# Patient Record
Sex: Female | Born: 1940
Health system: Southern US, Community
[De-identification: ages and names within clinical notes are randomized; demographics above are authoritative.]

## PROBLEM LIST (undated history)

## (undated) DIAGNOSIS — K259 Gastric ulcer, unspecified as acute or chronic, without hemorrhage or perforation: Secondary | ICD-10-CM

## (undated) DIAGNOSIS — K469 Unspecified abdominal hernia without obstruction or gangrene: Secondary | ICD-10-CM

## (undated) DIAGNOSIS — E785 Hyperlipidemia, unspecified: Secondary | ICD-10-CM

## (undated) DIAGNOSIS — M1990 Unspecified osteoarthritis, unspecified site: Secondary | ICD-10-CM

## (undated) DIAGNOSIS — K219 Gastro-esophageal reflux disease without esophagitis: Secondary | ICD-10-CM

## (undated) DIAGNOSIS — Z950 Presence of cardiac pacemaker: Secondary | ICD-10-CM

## (undated) DIAGNOSIS — IMO0001 Reserved for inherently not codable concepts without codable children: Secondary | ICD-10-CM

## (undated) DIAGNOSIS — I441 Atrioventricular block, second degree: Secondary | ICD-10-CM

## (undated) DIAGNOSIS — J449 Chronic obstructive pulmonary disease, unspecified: Secondary | ICD-10-CM

## (undated) DIAGNOSIS — I10 Essential (primary) hypertension: Secondary | ICD-10-CM

## (undated) DIAGNOSIS — J439 Emphysema, unspecified: Secondary | ICD-10-CM

## (undated) DIAGNOSIS — G4733 Obstructive sleep apnea (adult) (pediatric): Secondary | ICD-10-CM

## (undated) DIAGNOSIS — K2270 Barrett's esophagus without dysplasia: Secondary | ICD-10-CM

## (undated) DIAGNOSIS — G629 Polyneuropathy, unspecified: Secondary | ICD-10-CM

## (undated) HISTORY — DX: Hyperlipidemia, unspecified: E78.5

## (undated) HISTORY — PX: COLON SURGERY: SHX602

## (undated) HISTORY — PX: HERNIA REPAIR: SHX51

## (undated) HISTORY — DX: Essential (primary) hypertension: I10

## (undated) HISTORY — DX: Unspecified abdominal hernia without obstruction or gangrene: K46.9

## (undated) HISTORY — DX: Obstructive sleep apnea (adult) (pediatric): G47.33

## (undated) HISTORY — PX: INSERT / REPLACE / REMOVE PACEMAKER: SUR710

## (undated) HISTORY — DX: Chronic obstructive pulmonary disease, unspecified: J44.9

---

## 1978-11-28 HISTORY — PX: TOTAL ABDOMINAL HYSTERECTOMY: SHX209

## 2004-08-17 ENCOUNTER — Other Ambulatory Visit: Payer: Self-pay

## 2004-08-19 ENCOUNTER — Other Ambulatory Visit: Payer: Self-pay

## 2004-11-28 HISTORY — PX: APPENDECTOMY: SHX54

## 2005-04-21 ENCOUNTER — Ambulatory Visit: Payer: Self-pay | Admitting: Family Medicine

## 2005-08-10 ENCOUNTER — Ambulatory Visit: Payer: Self-pay | Admitting: Family Medicine

## 2005-08-17 ENCOUNTER — Ambulatory Visit: Payer: Self-pay | Admitting: Family Medicine

## 2005-08-22 DIAGNOSIS — R1031 Right lower quadrant pain: Secondary | ICD-10-CM | POA: Insufficient documentation

## 2005-08-22 DIAGNOSIS — K3533 Acute appendicitis with perforation and localized peritonitis, with abscess: Secondary | ICD-10-CM | POA: Insufficient documentation

## 2005-09-20 ENCOUNTER — Ambulatory Visit: Payer: Self-pay | Admitting: Surgery

## 2005-09-29 ENCOUNTER — Other Ambulatory Visit: Payer: Self-pay

## 2005-10-03 ENCOUNTER — Ambulatory Visit: Payer: Self-pay | Admitting: Surgery

## 2005-11-28 HISTORY — PX: CARDIAC CATHETERIZATION: SHX172

## 2005-11-28 HISTORY — PX: NISSEN FUNDOPLICATION: SHX2091

## 2005-11-28 HISTORY — PX: HIATAL HERNIA REPAIR: SHX195

## 2005-12-20 ENCOUNTER — Ambulatory Visit: Payer: Self-pay | Admitting: Internal Medicine

## 2005-12-28 ENCOUNTER — Ambulatory Visit: Payer: Self-pay | Admitting: Internal Medicine

## 2006-01-03 ENCOUNTER — Ambulatory Visit: Payer: Self-pay | Admitting: General Surgery

## 2006-02-09 ENCOUNTER — Ambulatory Visit: Payer: Self-pay

## 2006-02-12 ENCOUNTER — Ambulatory Visit: Payer: Self-pay | Admitting: Internal Medicine

## 2006-03-06 ENCOUNTER — Other Ambulatory Visit: Payer: Self-pay

## 2006-03-06 ENCOUNTER — Inpatient Hospital Stay: Payer: Self-pay | Admitting: Internal Medicine

## 2006-07-18 ENCOUNTER — Ambulatory Visit: Payer: Self-pay | Admitting: Gastroenterology

## 2006-08-28 DIAGNOSIS — K2100 Gastro-esophageal reflux disease with esophagitis, without bleeding: Secondary | ICD-10-CM | POA: Insufficient documentation

## 2006-10-04 ENCOUNTER — Ambulatory Visit: Payer: Self-pay | Admitting: Surgery

## 2006-10-16 ENCOUNTER — Ambulatory Visit: Payer: Self-pay | Admitting: Family Medicine

## 2007-02-01 ENCOUNTER — Ambulatory Visit: Payer: Self-pay | Admitting: Family Medicine

## 2007-03-12 ENCOUNTER — Ambulatory Visit: Payer: Self-pay | Admitting: Gastroenterology

## 2007-04-22 ENCOUNTER — Ambulatory Visit: Payer: Self-pay | Admitting: Emergency Medicine

## 2007-10-30 ENCOUNTER — Ambulatory Visit: Payer: Self-pay | Admitting: Gastroenterology

## 2007-10-31 ENCOUNTER — Ambulatory Visit: Payer: Self-pay | Admitting: Internal Medicine

## 2007-12-15 ENCOUNTER — Inpatient Hospital Stay: Payer: Self-pay | Admitting: Internal Medicine

## 2007-12-15 ENCOUNTER — Ambulatory Visit: Payer: Self-pay | Admitting: Internal Medicine

## 2007-12-19 ENCOUNTER — Ambulatory Visit: Payer: Self-pay | Admitting: Family Medicine

## 2008-11-18 ENCOUNTER — Ambulatory Visit: Payer: Self-pay | Admitting: Family Medicine

## 2008-11-26 ENCOUNTER — Ambulatory Visit: Payer: Self-pay | Admitting: Family Medicine

## 2008-12-09 ENCOUNTER — Ambulatory Visit: Payer: Self-pay | Admitting: Urology

## 2009-02-10 ENCOUNTER — Ambulatory Visit: Payer: Self-pay | Admitting: Family Medicine

## 2009-02-10 LAB — HM MAMMOGRAPHY

## 2009-04-15 ENCOUNTER — Ambulatory Visit: Payer: Self-pay | Admitting: Internal Medicine

## 2009-05-27 ENCOUNTER — Ambulatory Visit: Payer: Self-pay | Admitting: Family Medicine

## 2009-06-22 ENCOUNTER — Ambulatory Visit: Payer: Self-pay | Admitting: Gastroenterology

## 2009-11-13 ENCOUNTER — Ambulatory Visit: Payer: Self-pay | Admitting: Urology

## 2009-11-16 ENCOUNTER — Ambulatory Visit: Payer: Self-pay | Admitting: Urology

## 2010-04-12 ENCOUNTER — Ambulatory Visit: Payer: Self-pay | Admitting: Urology

## 2010-10-19 ENCOUNTER — Ambulatory Visit: Payer: Self-pay | Admitting: Family Medicine

## 2010-12-09 ENCOUNTER — Ambulatory Visit: Payer: Self-pay | Admitting: Urology

## 2011-01-14 ENCOUNTER — Ambulatory Visit: Payer: Self-pay | Admitting: Family Medicine

## 2011-03-10 ENCOUNTER — Ambulatory Visit: Payer: Self-pay | Admitting: Internal Medicine

## 2011-03-16 ENCOUNTER — Ambulatory Visit: Payer: Self-pay | Admitting: Ophthalmology

## 2011-03-29 ENCOUNTER — Ambulatory Visit: Payer: Self-pay | Admitting: Family Medicine

## 2011-04-13 ENCOUNTER — Ambulatory Visit: Payer: Self-pay | Admitting: Ophthalmology

## 2011-09-13 ENCOUNTER — Other Ambulatory Visit: Payer: Self-pay | Admitting: Gastroenterology

## 2011-09-20 ENCOUNTER — Ambulatory Visit: Payer: Self-pay | Admitting: Internal Medicine

## 2011-09-22 DIAGNOSIS — Z950 Presence of cardiac pacemaker: Secondary | ICD-10-CM

## 2011-09-22 HISTORY — PX: PACEMAKER INSERTION: SHX728

## 2011-09-22 HISTORY — DX: Presence of cardiac pacemaker: Z95.0

## 2011-12-05 ENCOUNTER — Ambulatory Visit: Payer: Self-pay | Admitting: Family Medicine

## 2011-12-05 DIAGNOSIS — E7800 Pure hypercholesterolemia, unspecified: Secondary | ICD-10-CM | POA: Diagnosis not present

## 2011-12-05 DIAGNOSIS — Z79899 Other long term (current) drug therapy: Secondary | ICD-10-CM | POA: Diagnosis not present

## 2011-12-05 DIAGNOSIS — R5381 Other malaise: Secondary | ICD-10-CM | POA: Diagnosis not present

## 2011-12-05 LAB — CBC WITH DIFFERENTIAL/PLATELET
Basophil %: 0.7 %
Eosinophil %: 2.7 %
HCT: 48.4 % — ABNORMAL HIGH (ref 35.0–47.0)
HGB: 15.8 g/dL (ref 12.0–16.0)
MCH: 29.1 pg (ref 26.0–34.0)
MCV: 89 fL (ref 80–100)
Monocyte #: 0.4 10*3/uL (ref 0.0–0.7)
Neutrophil %: 77.8 %
RBC: 5.44 10*6/uL — ABNORMAL HIGH (ref 3.80–5.20)

## 2011-12-05 LAB — COMPREHENSIVE METABOLIC PANEL
Alkaline Phosphatase: 116 U/L (ref 50–136)
Anion Gap: 8 (ref 7–16)
BUN: 21 mg/dL — ABNORMAL HIGH (ref 7–18)
Co2: 31 mmol/L (ref 21–32)
Creatinine: 1.12 mg/dL (ref 0.60–1.30)
EGFR (African American): >60
EGFR (Non-African Amer.): 51 — ABNORMAL LOW
Osmolality: 285 (ref 275–301)
SGOT(AST): 18 U/L (ref 15–37)
SGPT (ALT): 20 U/L

## 2011-12-05 LAB — LIPID PANEL
Cholesterol: 313 mg/dL — ABNORMAL HIGH (ref 0–200)
Ldl Cholesterol, Calc: 220 mg/dL — ABNORMAL HIGH (ref 0–100)
VLDL Cholesterol, Calc: 49 mg/dL — ABNORMAL HIGH (ref 5–40)

## 2011-12-07 DIAGNOSIS — R5381 Other malaise: Secondary | ICD-10-CM | POA: Diagnosis not present

## 2011-12-07 DIAGNOSIS — E782 Mixed hyperlipidemia: Secondary | ICD-10-CM | POA: Diagnosis not present

## 2011-12-07 DIAGNOSIS — I441 Atrioventricular block, second degree: Secondary | ICD-10-CM | POA: Diagnosis not present

## 2011-12-07 DIAGNOSIS — R5383 Other fatigue: Secondary | ICD-10-CM | POA: Diagnosis not present

## 2011-12-07 DIAGNOSIS — I10 Essential (primary) hypertension: Secondary | ICD-10-CM | POA: Diagnosis not present

## 2011-12-20 DIAGNOSIS — R198 Other specified symptoms and signs involving the digestive system and abdomen: Secondary | ICD-10-CM | POA: Diagnosis not present

## 2011-12-20 DIAGNOSIS — K2270 Barrett's esophagus without dysplasia: Secondary | ICD-10-CM | POA: Diagnosis not present

## 2011-12-20 DIAGNOSIS — R1031 Right lower quadrant pain: Secondary | ICD-10-CM | POA: Diagnosis not present

## 2011-12-29 DIAGNOSIS — I10 Essential (primary) hypertension: Secondary | ICD-10-CM | POA: Diagnosis not present

## 2011-12-29 DIAGNOSIS — R5381 Other malaise: Secondary | ICD-10-CM | POA: Diagnosis not present

## 2011-12-29 DIAGNOSIS — G4730 Sleep apnea, unspecified: Secondary | ICD-10-CM | POA: Diagnosis not present

## 2011-12-29 DIAGNOSIS — I441 Atrioventricular block, second degree: Secondary | ICD-10-CM | POA: Diagnosis not present

## 2012-01-04 DIAGNOSIS — F17200 Nicotine dependence, unspecified, uncomplicated: Secondary | ICD-10-CM | POA: Diagnosis not present

## 2012-01-04 DIAGNOSIS — I10 Essential (primary) hypertension: Secondary | ICD-10-CM | POA: Diagnosis not present

## 2012-01-04 DIAGNOSIS — R5381 Other malaise: Secondary | ICD-10-CM | POA: Diagnosis not present

## 2012-01-04 DIAGNOSIS — J209 Acute bronchitis, unspecified: Secondary | ICD-10-CM | POA: Diagnosis not present

## 2012-01-04 DIAGNOSIS — R5383 Other fatigue: Secondary | ICD-10-CM | POA: Diagnosis not present

## 2012-01-06 DIAGNOSIS — F17200 Nicotine dependence, unspecified, uncomplicated: Secondary | ICD-10-CM | POA: Diagnosis not present

## 2012-01-06 DIAGNOSIS — I10 Essential (primary) hypertension: Secondary | ICD-10-CM | POA: Diagnosis not present

## 2012-01-06 DIAGNOSIS — R5383 Other fatigue: Secondary | ICD-10-CM | POA: Diagnosis not present

## 2012-01-06 DIAGNOSIS — R5381 Other malaise: Secondary | ICD-10-CM | POA: Diagnosis not present

## 2012-01-06 DIAGNOSIS — J40 Bronchitis, not specified as acute or chronic: Secondary | ICD-10-CM | POA: Diagnosis not present

## 2012-01-06 DIAGNOSIS — R0602 Shortness of breath: Secondary | ICD-10-CM | POA: Diagnosis not present

## 2012-01-06 DIAGNOSIS — R062 Wheezing: Secondary | ICD-10-CM | POA: Diagnosis not present

## 2012-01-06 DIAGNOSIS — R918 Other nonspecific abnormal finding of lung field: Secondary | ICD-10-CM | POA: Diagnosis not present

## 2012-01-09 DIAGNOSIS — R059 Cough, unspecified: Secondary | ICD-10-CM | POA: Diagnosis not present

## 2012-01-09 DIAGNOSIS — J209 Acute bronchitis, unspecified: Secondary | ICD-10-CM | POA: Diagnosis not present

## 2012-01-09 DIAGNOSIS — R5381 Other malaise: Secondary | ICD-10-CM | POA: Diagnosis not present

## 2012-01-09 DIAGNOSIS — J449 Chronic obstructive pulmonary disease, unspecified: Secondary | ICD-10-CM | POA: Diagnosis not present

## 2012-01-16 ENCOUNTER — Emergency Department: Payer: Self-pay | Admitting: Emergency Medicine

## 2012-01-16 DIAGNOSIS — Z79899 Other long term (current) drug therapy: Secondary | ICD-10-CM | POA: Diagnosis not present

## 2012-01-16 DIAGNOSIS — I10 Essential (primary) hypertension: Secondary | ICD-10-CM | POA: Diagnosis not present

## 2012-01-16 DIAGNOSIS — S42293A Other displaced fracture of upper end of unspecified humerus, initial encounter for closed fracture: Secondary | ICD-10-CM | POA: Diagnosis not present

## 2012-01-16 DIAGNOSIS — Z0489 Encounter for examination and observation for other specified reasons: Secondary | ICD-10-CM | POA: Diagnosis not present

## 2012-01-16 DIAGNOSIS — S0990XA Unspecified injury of head, initial encounter: Secondary | ICD-10-CM | POA: Diagnosis not present

## 2012-01-17 DIAGNOSIS — Z0489 Encounter for examination and observation for other specified reasons: Secondary | ICD-10-CM | POA: Diagnosis not present

## 2012-01-18 DIAGNOSIS — S42293A Other displaced fracture of upper end of unspecified humerus, initial encounter for closed fracture: Secondary | ICD-10-CM | POA: Diagnosis not present

## 2012-01-25 DIAGNOSIS — S42309D Unspecified fracture of shaft of humerus, unspecified arm, subsequent encounter for fracture with routine healing: Secondary | ICD-10-CM | POA: Diagnosis not present

## 2012-01-30 ENCOUNTER — Institutional Professional Consult (permissible substitution): Payer: Self-pay | Admitting: Pulmonary Disease

## 2012-02-06 DIAGNOSIS — S42253A Displaced fracture of greater tuberosity of unspecified humerus, initial encounter for closed fracture: Secondary | ICD-10-CM | POA: Diagnosis not present

## 2012-02-06 DIAGNOSIS — S42293A Other displaced fracture of upper end of unspecified humerus, initial encounter for closed fracture: Secondary | ICD-10-CM | POA: Diagnosis not present

## 2012-02-13 ENCOUNTER — Ambulatory Visit (INDEPENDENT_AMBULATORY_CARE_PROVIDER_SITE_OTHER): Payer: Medicare Other | Admitting: Pulmonary Disease

## 2012-02-13 ENCOUNTER — Encounter: Payer: Self-pay | Admitting: Pulmonary Disease

## 2012-02-13 VITALS — BP 122/76 | HR 81 | Temp 98.2°F | Ht 64.56 in | Wt 170.0 lb

## 2012-02-13 DIAGNOSIS — Z720 Tobacco use: Secondary | ICD-10-CM | POA: Insufficient documentation

## 2012-02-13 DIAGNOSIS — F17200 Nicotine dependence, unspecified, uncomplicated: Secondary | ICD-10-CM | POA: Insufficient documentation

## 2012-02-13 DIAGNOSIS — J3489 Other specified disorders of nose and nasal sinuses: Secondary | ICD-10-CM

## 2012-02-13 DIAGNOSIS — R0981 Nasal congestion: Secondary | ICD-10-CM | POA: Insufficient documentation

## 2012-02-13 DIAGNOSIS — J449 Chronic obstructive pulmonary disease, unspecified: Secondary | ICD-10-CM | POA: Insufficient documentation

## 2012-02-13 MED ORDER — NICOTINE 21 MG/24HR TD PT24: 1.0000 | MEDICATED_PATCH | TRANSDERMAL | Status: AC

## 2012-02-13 NOTE — Patient Instructions (Addendum)
Stop smoking Continue using the symbicort and albuterol as you are doing. Go see Dr. Sullivan Lone about your hip, shoulder and back pain. We will give you nicotine replacement patches Use Neil Med rinses with distilled water at least twice per day using the instructions on the package. 1/2 hour after using the [facility] Med rinse, use Nasonex two puffs in each nostril once per day. Use chlortrimeton and an over the counter decongestant (ask the pharmacist for a recommendation) as needed for the sinus congestion. We will see you back in 2 months and refer you to pulmonary rehab at that point or sooner if your hip pain has improved.

## 2012-02-13 NOTE — Progress Notes (Addendum)
Subjective:    Patient ID: Crystal White, female    DOB: Jul 25, 1941, 71 y.o.   MRN: [id number]  HPI  This is a 71 y/o female with COPD who is referred to our clinic for further evaluation and management tof the same.  She had a normal childhood with out significant respiratory problems, however as an adult she started smoking.  She has smoked one pack per day or more for many decades and continues to smoke one pack per day now.  She was diagnosed with COPD two years ago when she developed dyspnea on exertion.  She has noted a chronic non-productive cough during that time.  She often has to stop and rest while walking on level ground, however she can walk further than 100 yards without stopping (mMRC score 3).  She does not have chest pain with walking but sometimes does note back pain on exertion.  She started taking symbicort two years ago but is unsure if it has helped.  Taking albuterol helps.  In October 2012 she had a pacemaker placed emergently for heart block, and she has not exercised since then.    Six weeks ago she developed bronchitis with pleurisy and was treated with prednisone and Levaquin.  Her symptoms resolved after two weeks but unfortunately she fell and broke her arm around this time.  During her ER visit she had a CT head performed which apparently showed a "sinus infection".     Recently has been "strangling on her own saliva", never on food/liquid.  When asked to clarify, she says that this only at night while recumbent.  She states that she wakes up with "junk" in her throat which she has to cough out.  To clarify, food does not get stuck when she swallows.  Past Medical History  Diagnosis Date  . Hypertension   . COPD (chronic obstructive pulmonary disease)   . Hyperlipidemia   . OSA (obstructive sleep apnea)      No family history on file.   History   Social History  . Marital Status: Married    Spouse Name: widowed    Number of Children: Y  . Years of Education:  N/A   Occupational History  . retired Research officer, political party    Social History Main Topics  . Smoking status: Current Everyday Smoker -- 1.0 packs/day for 55 years    Types: Cigarettes  . Smokeless tobacco: Not on file  . Alcohol Use: Not on file  . Drug Use: Not on file  . Sexually Active: Not on file   Other Topics Concern  . Not on file   Social History Narrative   Pt was adopted.      Allergies  Allergen Reactions  . Contrast Media (Iodinated Diagnostic Agents)      No outpatient prescriptions prior to visit.   Outpatient meds reviewed, include but not limited to Nasonex, Symbicort, albuterol, and omeprazole and alprazolam.   Review of Systems  Constitutional: Positive for appetite change. Negative for fever and unexpected weight change.  HENT: Positive for dental problem. Negative for ear pain, nosebleeds, congestion, sore throat, rhinorrhea, sneezing, trouble swallowing, postnasal drip and sinus pressure.   Eyes: Negative for redness and itching.  Respiratory: Positive for shortness of breath. Negative for cough, chest tightness and wheezing.   Cardiovascular: Negative for palpitations and leg swelling.  Gastrointestinal: Negative for nausea and vomiting.  Genitourinary: Negative for dysuria.  Musculoskeletal: Positive for joint swelling.  Skin: Negative for rash.  Neurological: Negative for  headaches.  Hematological: Does not bruise/bleed easily.  Psychiatric/Behavioral: Negative for dysphoric mood. The patient is not nervous/anxious.        Objective:   Physical Exam  Filed Vitals:   02/13/12 1614  BP: 122/76  Pulse: 81  Temp: 98.2 F (36.8 C)  TempSrc: Oral  Height: 5' 4.56" (1.64 m)  Weight: 170 lb (77.111 kg)  SpO2: 93%   Gen: well appearing, no acute distress HEENT: NCAT, PERRL, EOMi, OP clear, neck supple without masses PULM: Few exp wheezes, insp crackles R base noted CV: RRR, no mgr, no JVD AB: BS+, soft, nontender, no hsm Ext: warm, no edema, no  clubbing, no cyanosis; R arm in sling Derm: no rash or skin breakdown Neuro: A&Ox4, CN II-XII intact, maew  2010 Spirometry: Ratio not reported but flow volume loop shows clear obstruction; FEV1 1.89 L or 52% predicted     Assessment & Plan:   COPD (chronic obstructive pulmonary disease) COPD: GOLD Stage II Combined recommendations from the [employer], [employer], Designer, television/film set, European Respiratory Society (Qaseem A et al, Ann Intern Med. 2011;155(3):179) recommends tobacco cessation, pulmonary rehab (for symptomatic patients with an FEV1 < 50% predicted), supplemental oxygen (for patients with SaO2 <88% or paO2 <55), and appropriate bronchodilator therapy.  In regards to long acting bronchodilators, they recommend monotherapy (FEV1 60-80% with symptoms weak evidence, FEV1 with symptoms <60% strong evidence), or combination therapy (FEV1 <60% with symptoms, strong recommendation, moderate evidence).  One should also provide patients with annual immunizations and consider therapy for prevention of COPD exacerbations (ie. roflumilast or azithromycin) when appopriate.  -O2 therapy: not indicated -Immunizations: up to date -Tobacco use: Quitting is the most significant thing she could do to help.  I encouraged her at length to quit. -Exercise: needs to start pulmonary rehab, but we are limited by her hip pain; will start after hip pain improves -Bronchodilator therapy: no indication to change her current regimen -Exacerbation prevention: not indicated   Tobacco abuse We discussed this at length today.  I advised her to quit.  She states that she would be willing to try nicotine replacement patches which I will prescribe.  Sinus congestion I think this is the likely explanation for the choking sensation that she has at night.   Plan: -continue nasonex -start nasal rinses, chlortrimeton, decongestants     Updated Medication  List Outpatient Encounter Prescriptions as of 02/13/2012  Medication Sig Dispense Refill  . albuterol (PROVENTIL HFA) 108 (90 BASE) MCG/ACT inhaler Inhale 2 puffs into the lungs every 6 (six) hours as needed.      . ALPRAZolam (XANAX) 0.25 MG tablet Take 0.25 mg by mouth as needed.       . budesonide-formoterol (SYMBICORT) 160-4.5 MCG/ACT inhaler Inhale 2 puffs into the lungs 2 (two) times daily.      . hydrochlorothiazide (HYDRODIURIL) 25 MG tablet Take 25 mg by mouth daily.      . mirtazapine (REMERON) 30 MG tablet Take 30 mg by mouth at bedtime.      . mometasone (NASONEX) 50 MCG/ACT nasal spray Place 2 sprays into the nose daily.      . nicotine (NICODERM CQ - DOSED IN MG/24 HOURS) 21 mg/24hr patch Place 1 patch onto the skin daily.  30 patch  1  . omeprazole (PRILOSEC) 20 MG capsule Take 20 mg by mouth daily.      Marland Kitchen oxyCODONE-acetaminophen (PERCOCET) 5-325 MG per tablet Take 1 tablet by mouth every 6 (  six) hours as needed.      . pregabalin (LYRICA) 50 MG capsule Take 50 mg by mouth daily.      Marland Kitchen zolpidem (AMBIEN) 10 MG tablet Take 10 mg by mouth at bedtime.      Marland Kitchen DISCONTD: hydrocodone-acetaminophen (ZYDONE) 10-400 MG per tablet Take 1 tablet by mouth every 6 (six) hours as needed.

## 2012-02-13 NOTE — Assessment & Plan Note (Signed)
We discussed this at length today.  I advised her to quit.  She states that she would be willing to try nicotine replacement patches which I will prescribe.

## 2012-02-13 NOTE — Assessment & Plan Note (Signed)
I think this is the likely explanation for the choking sensation that she has at night.   Plan: -continue nasonex -start nasal rinses, chlortrimeton, decongestants

## 2012-02-13 NOTE — Assessment & Plan Note (Signed)
COPD: GOLD Stage II Combined recommendations from the [employer]	[place], [employer], Designer, television/film set, European Respiratory Society (Qaseem A et al, Ann Intern Med. 2011;155(3):179) recommends tobacco cessation, pulmonary rehab (for symptomatic patients with an FEV1 < 50% predicted), supplemental oxygen (for patients with SaO2 <88% or paO2 <55), and appropriate bronchodilator therapy.  In regards to long acting bronchodilators, they recommend monotherapy (FEV1 60-80% with symptoms weak evidence, FEV1 with symptoms <60% strong evidence), or combination therapy (FEV1 <60% with symptoms, strong recommendation, moderate evidence).  One should also provide patients with annual immunizations and consider therapy for prevention of COPD exacerbations (ie. roflumilast or azithromycin) when appopriate.  -O2 therapy: not indicated -Immunizations: up to date -Tobacco use: Quitting is the most significant thing she could do to help.  I encouraged her at length to quit. -Exercise: needs to start pulmonary rehab, but we are limited by her hip pain; will start after hip pain improves -Bronchodilator therapy: no indication to change her current regimen -Exacerbation prevention: not indicated

## 2012-02-21 DIAGNOSIS — M6281 Muscle weakness (generalized): Secondary | ICD-10-CM | POA: Diagnosis not present

## 2012-02-21 DIAGNOSIS — S42293A Other displaced fracture of upper end of unspecified humerus, initial encounter for closed fracture: Secondary | ICD-10-CM | POA: Diagnosis not present

## 2012-02-21 DIAGNOSIS — M25619 Stiffness of unspecified shoulder, not elsewhere classified: Secondary | ICD-10-CM | POA: Diagnosis not present

## 2012-02-21 DIAGNOSIS — M25519 Pain in unspecified shoulder: Secondary | ICD-10-CM | POA: Diagnosis not present

## 2012-02-23 DIAGNOSIS — M25619 Stiffness of unspecified shoulder, not elsewhere classified: Secondary | ICD-10-CM | POA: Diagnosis not present

## 2012-02-23 DIAGNOSIS — S42293A Other displaced fracture of upper end of unspecified humerus, initial encounter for closed fracture: Secondary | ICD-10-CM | POA: Diagnosis not present

## 2012-02-23 DIAGNOSIS — M25519 Pain in unspecified shoulder: Secondary | ICD-10-CM | POA: Diagnosis not present

## 2012-02-23 DIAGNOSIS — M6281 Muscle weakness (generalized): Secondary | ICD-10-CM | POA: Diagnosis not present

## 2012-02-27 DIAGNOSIS — S42293A Other displaced fracture of upper end of unspecified humerus, initial encounter for closed fracture: Secondary | ICD-10-CM | POA: Diagnosis not present

## 2012-02-27 DIAGNOSIS — S42253A Displaced fracture of greater tuberosity of unspecified humerus, initial encounter for closed fracture: Secondary | ICD-10-CM | POA: Diagnosis not present

## 2012-02-28 DIAGNOSIS — M25619 Stiffness of unspecified shoulder, not elsewhere classified: Secondary | ICD-10-CM | POA: Diagnosis not present

## 2012-02-28 DIAGNOSIS — S42293A Other displaced fracture of upper end of unspecified humerus, initial encounter for closed fracture: Secondary | ICD-10-CM | POA: Diagnosis not present

## 2012-02-28 DIAGNOSIS — M6281 Muscle weakness (generalized): Secondary | ICD-10-CM | POA: Diagnosis not present

## 2012-02-28 DIAGNOSIS — M25519 Pain in unspecified shoulder: Secondary | ICD-10-CM | POA: Diagnosis not present

## 2012-03-01 DIAGNOSIS — M6281 Muscle weakness (generalized): Secondary | ICD-10-CM | POA: Diagnosis not present

## 2012-03-01 DIAGNOSIS — S42293A Other displaced fracture of upper end of unspecified humerus, initial encounter for closed fracture: Secondary | ICD-10-CM | POA: Diagnosis not present

## 2012-03-01 DIAGNOSIS — M25619 Stiffness of unspecified shoulder, not elsewhere classified: Secondary | ICD-10-CM | POA: Diagnosis not present

## 2012-03-05 DIAGNOSIS — M6281 Muscle weakness (generalized): Secondary | ICD-10-CM | POA: Diagnosis not present

## 2012-03-05 DIAGNOSIS — M25619 Stiffness of unspecified shoulder, not elsewhere classified: Secondary | ICD-10-CM | POA: Diagnosis not present

## 2012-03-05 DIAGNOSIS — S42293A Other displaced fracture of upper end of unspecified humerus, initial encounter for closed fracture: Secondary | ICD-10-CM | POA: Diagnosis not present

## 2012-03-07 DIAGNOSIS — M25619 Stiffness of unspecified shoulder, not elsewhere classified: Secondary | ICD-10-CM | POA: Diagnosis not present

## 2012-03-07 DIAGNOSIS — S42293A Other displaced fracture of upper end of unspecified humerus, initial encounter for closed fracture: Secondary | ICD-10-CM | POA: Diagnosis not present

## 2012-03-07 DIAGNOSIS — M6281 Muscle weakness (generalized): Secondary | ICD-10-CM | POA: Diagnosis not present

## 2012-03-12 DIAGNOSIS — M6281 Muscle weakness (generalized): Secondary | ICD-10-CM | POA: Diagnosis not present

## 2012-03-12 DIAGNOSIS — M25619 Stiffness of unspecified shoulder, not elsewhere classified: Secondary | ICD-10-CM | POA: Diagnosis not present

## 2012-03-12 DIAGNOSIS — S42293A Other displaced fracture of upper end of unspecified humerus, initial encounter for closed fracture: Secondary | ICD-10-CM | POA: Diagnosis not present

## 2012-03-20 ENCOUNTER — Ambulatory Visit: Payer: Self-pay | Admitting: Gastroenterology

## 2012-03-20 DIAGNOSIS — K219 Gastro-esophageal reflux disease without esophagitis: Secondary | ICD-10-CM | POA: Diagnosis not present

## 2012-03-20 DIAGNOSIS — D175 Benign lipomatous neoplasm of intra-abdominal organs: Secondary | ICD-10-CM | POA: Diagnosis not present

## 2012-03-20 DIAGNOSIS — Z79899 Other long term (current) drug therapy: Secondary | ICD-10-CM | POA: Diagnosis not present

## 2012-03-20 DIAGNOSIS — G2581 Restless legs syndrome: Secondary | ICD-10-CM | POA: Diagnosis not present

## 2012-03-20 DIAGNOSIS — K2270 Barrett's esophagus without dysplasia: Secondary | ICD-10-CM | POA: Diagnosis not present

## 2012-03-20 DIAGNOSIS — K2990 Gastroduodenitis, unspecified, without bleeding: Secondary | ICD-10-CM | POA: Diagnosis not present

## 2012-03-20 DIAGNOSIS — R197 Diarrhea, unspecified: Secondary | ICD-10-CM | POA: Diagnosis not present

## 2012-03-20 DIAGNOSIS — Z833 Family history of diabetes mellitus: Secondary | ICD-10-CM | POA: Diagnosis not present

## 2012-03-20 DIAGNOSIS — K2960 Other gastritis without bleeding: Secondary | ICD-10-CM | POA: Diagnosis not present

## 2012-03-20 DIAGNOSIS — R198 Other specified symptoms and signs involving the digestive system and abdomen: Secondary | ICD-10-CM | POA: Diagnosis not present

## 2012-03-20 DIAGNOSIS — F17200 Nicotine dependence, unspecified, uncomplicated: Secondary | ICD-10-CM | POA: Diagnosis not present

## 2012-03-20 DIAGNOSIS — J449 Chronic obstructive pulmonary disease, unspecified: Secondary | ICD-10-CM | POA: Diagnosis not present

## 2012-03-20 DIAGNOSIS — K2940 Chronic atrophic gastritis without bleeding: Secondary | ICD-10-CM | POA: Diagnosis not present

## 2012-03-20 DIAGNOSIS — Z950 Presence of cardiac pacemaker: Secondary | ICD-10-CM | POA: Diagnosis not present

## 2012-03-20 DIAGNOSIS — D126 Benign neoplasm of colon, unspecified: Secondary | ICD-10-CM | POA: Diagnosis not present

## 2012-03-20 DIAGNOSIS — K5730 Diverticulosis of large intestine without perforation or abscess without bleeding: Secondary | ICD-10-CM | POA: Diagnosis not present

## 2012-03-20 DIAGNOSIS — K2970 Gastritis, unspecified, without bleeding: Secondary | ICD-10-CM | POA: Diagnosis not present

## 2012-03-20 DIAGNOSIS — R1031 Right lower quadrant pain: Secondary | ICD-10-CM | POA: Diagnosis not present

## 2012-03-20 DIAGNOSIS — G609 Hereditary and idiopathic neuropathy, unspecified: Secondary | ICD-10-CM | POA: Diagnosis not present

## 2012-03-20 DIAGNOSIS — I10 Essential (primary) hypertension: Secondary | ICD-10-CM | POA: Diagnosis not present

## 2012-03-20 DIAGNOSIS — Z9089 Acquired absence of other organs: Secondary | ICD-10-CM | POA: Diagnosis not present

## 2012-03-20 DIAGNOSIS — K209 Esophagitis, unspecified without bleeding: Secondary | ICD-10-CM | POA: Diagnosis not present

## 2012-03-20 DIAGNOSIS — G4733 Obstructive sleep apnea (adult) (pediatric): Secondary | ICD-10-CM | POA: Diagnosis not present

## 2012-03-20 DIAGNOSIS — K6389 Other specified diseases of intestine: Secondary | ICD-10-CM | POA: Diagnosis not present

## 2012-03-20 DIAGNOSIS — K319 Disease of stomach and duodenum, unspecified: Secondary | ICD-10-CM | POA: Diagnosis not present

## 2012-03-20 DIAGNOSIS — G4730 Sleep apnea, unspecified: Secondary | ICD-10-CM | POA: Diagnosis not present

## 2012-03-21 LAB — PATHOLOGY REPORT

## 2012-03-27 DIAGNOSIS — R5383 Other fatigue: Secondary | ICD-10-CM | POA: Diagnosis not present

## 2012-03-27 DIAGNOSIS — F329 Major depressive disorder, single episode, unspecified: Secondary | ICD-10-CM | POA: Diagnosis not present

## 2012-03-27 DIAGNOSIS — G589 Mononeuropathy, unspecified: Secondary | ICD-10-CM | POA: Diagnosis not present

## 2012-03-27 DIAGNOSIS — I10 Essential (primary) hypertension: Secondary | ICD-10-CM | POA: Diagnosis not present

## 2012-03-27 DIAGNOSIS — R5381 Other malaise: Secondary | ICD-10-CM | POA: Diagnosis not present

## 2012-04-13 ENCOUNTER — Encounter: Payer: Self-pay | Admitting: Pulmonary Disease

## 2012-04-13 ENCOUNTER — Ambulatory Visit (INDEPENDENT_AMBULATORY_CARE_PROVIDER_SITE_OTHER): Payer: Medicare Other | Admitting: Pulmonary Disease

## 2012-04-13 VITALS — BP 140/84 | HR 81 | Temp 98.4°F | Ht 64.5 in | Wt 169.1 lb

## 2012-04-13 DIAGNOSIS — F17200 Nicotine dependence, unspecified, uncomplicated: Secondary | ICD-10-CM

## 2012-04-13 DIAGNOSIS — J449 Chronic obstructive pulmonary disease, unspecified: Secondary | ICD-10-CM | POA: Diagnosis not present

## 2012-04-13 DIAGNOSIS — Z720 Tobacco use: Secondary | ICD-10-CM

## 2012-04-13 MED ORDER — VARENICLINE TARTRATE 0.5 MG X 11 & 1 MG X 42 PO MISC: ORAL | Status: AC

## 2012-04-13 NOTE — Progress Notes (Signed)
Subjective:    Patient ID: Crystal White, female    DOB: [birth year], 71 y.o.   MRN: [id number]  Synopsis: Crystal White first saw the Crystal White in 01/2012 for COPD.  Her FEV1 was 1.89 L (52%) predicted and her mMRC score was 2.  She smokes 1/2 ppd and was still smoking in May 2013.  HPI   04/13/12 ROV -- Crystal White states that she is feeling great and is happy to have recovered well from her recent injuries that plagued her back in the winter.  She is using her inhalers regularly.  She still wants to increase her exercise tolerance and notes that she would like to start pulmonary rehab next week.  She is still smoking 1/2 ppd and wants to quit.  She has an upcoming road trip with her children and grandchildren and she really wants to quit before then.  Her cough and dyspnea are better than when we saw her last.     Past Medical History  Diagnosis Date  . Hypertension   . COPD (chronic obstructive pulmonary disease)   . Hyperlipidemia   . OSA (obstructive sleep apnea)       Review of Systems  Constitutional: Negative for fever, appetite change and unexpected weight change.  HENT: Negative for congestion, rhinorrhea, sneezing, trouble swallowing, postnasal drip and sinus pressure.   Respiratory: Positive for shortness of breath. Negative for cough, chest tightness and wheezing.        Objective:   Physical Exam   Filed Vitals:   04/13/12 1356  BP: 140/84  Pulse: 81  Temp: 98.4 F (36.9 C)  TempSrc: Oral  Height: 5' 4.5" (1.638 m)  Weight: 169 lb 1.9 oz (76.712 kg)  SpO2: 94%   Gen: well appearing, no acute distress HEENT: NCAT, PERRL, EOMi, OP clear, neck supple without masses PULM: CTA B CV: RRR, no mgr, no JVD AB: BS+, soft, nontender, no hsm Ext: warm, no edema, no clubbing, no cyanosis; R arm in sling   2010 Spirometry: Ratio not reported but flow volume loop shows clear obstruction; FEV1 1.89 L or 52% predicted     Assessment & Plan:   COPD  (chronic obstructive pulmonary disease) This has been a fairly stable interval for Crystal White in terms of her COPD.  Given that she has recovered from her previous fractures, she is doing much better overall.  There is no indication to change her inhalers.  However, she really wants to quit smoking and increase her exercise tolerance.  Plan: -start pulmonary rehab at [facility] next week -continue current inhalers -see tobacco abuse  Tobacco abuse We discussed this at length today.  She really wants to quit before she goes on a road trip to [place] with her grandchildren in June.  I encouraged her to set a quit date before or on the day of the road trip.  We discussed various treatment options available and she chose Chantix.  I outlined the side effect profile and let her know that if she feels depression or suicidality she should stop the medication immediately and let me know.  Plan: -start Chantix     Updated Medication List Outpatient Encounter Prescriptions as of 04/13/2012  Medication Sig Dispense Refill  . albuterol (PROVENTIL HFA) 108 (90 BASE) MCG/ACT inhaler Inhale 2 puffs into the lungs every 6 (six) hours as needed.      . ALPRAZolam (XANAX) 0.25 MG tablet Take 0.25 mg by mouth as needed.       Marland Kitchen  budesonide-formoterol (SYMBICORT) 160-4.5 MCG/ACT inhaler Inhale 2 puffs into the lungs 2 (two) times daily.      . hydrochlorothiazide (HYDRODIURIL) 25 MG tablet Take 25 mg by mouth daily.      . mirtazapine (REMERON) 30 MG tablet Take 30 mg by mouth at bedtime.      . mometasone (NASONEX) 50 MCG/ACT nasal spray Place 2 sprays into the nose daily as needed.       Marland Kitchen omeprazole (PRILOSEC) 40 MG capsule Take 40 mg by mouth daily.      Marland Kitchen oxyCODONE-acetaminophen (PERCOCET) 5-325 MG per tablet Take 1 tablet by mouth every 6 (six) hours as needed.      . pregabalin (LYRICA) 50 MG capsule Take 50 mg by mouth daily.      Marland Kitchen zolpidem (AMBIEN) 10 MG tablet Take 10 mg by mouth at bedtime.      Marland Kitchen  DISCONTD: omeprazole (PRILOSEC) 20 MG capsule Take 20 mg by mouth daily.

## 2012-04-13 NOTE — Patient Instructions (Signed)
Keep taking your inhalers as you are doing. We will get you referred to pulmonary rehab at [facility]. Start taking Chantix as written in the starter pack.   Quit smoking when you go on the road trip to [place]!  We will see you back in 6 months

## 2012-04-14 NOTE — Assessment & Plan Note (Signed)
This has been a fairly stable interval for Crystal. White in terms of her COPD.  Given that she has recovered from her previous fractures, she is doing much better overall.  There is no indication to change her inhalers.  However, she really wants to quit smoking and increase her exercise tolerance.  Plan: -start pulmonary rehab at [facility] next week -continue current inhalers -see tobacco abuse

## 2012-04-14 NOTE — Assessment & Plan Note (Signed)
We discussed this at length today.  She really wants to quit before she goes on a road trip to [place] with her grandchildren in June.  I encouraged her to set a quit date before or on the day of the road trip.  We discussed various treatment options available and she chose Chantix.  I outlined the side effect profile and let her know that if she feels depression or suicidality she should stop the medication immediately and let me know.  Plan: -start Chantix

## 2012-04-17 ENCOUNTER — Encounter: Payer: Self-pay | Admitting: Pulmonary Disease

## 2012-04-17 DIAGNOSIS — J449 Chronic obstructive pulmonary disease, unspecified: Secondary | ICD-10-CM | POA: Diagnosis not present

## 2012-04-17 DIAGNOSIS — Z5189 Encounter for other specified aftercare: Secondary | ICD-10-CM | POA: Diagnosis not present

## 2012-04-20 DIAGNOSIS — K2270 Barrett's esophagus without dysplasia: Secondary | ICD-10-CM | POA: Diagnosis not present

## 2012-04-20 DIAGNOSIS — K2100 Gastro-esophageal reflux disease with esophagitis, without bleeding: Secondary | ICD-10-CM | POA: Diagnosis not present

## 2012-04-20 DIAGNOSIS — D126 Benign neoplasm of colon, unspecified: Secondary | ICD-10-CM | POA: Diagnosis not present

## 2012-04-20 DIAGNOSIS — I10 Essential (primary) hypertension: Secondary | ICD-10-CM | POA: Diagnosis not present

## 2012-04-28 ENCOUNTER — Encounter: Payer: Self-pay | Admitting: Pulmonary Disease

## 2012-04-28 DIAGNOSIS — Z5189 Encounter for other specified aftercare: Secondary | ICD-10-CM | POA: Diagnosis not present

## 2012-04-28 DIAGNOSIS — J449 Chronic obstructive pulmonary disease, unspecified: Secondary | ICD-10-CM | POA: Diagnosis not present

## 2012-05-01 DIAGNOSIS — G4700 Insomnia, unspecified: Secondary | ICD-10-CM | POA: Diagnosis not present

## 2012-05-01 DIAGNOSIS — J449 Chronic obstructive pulmonary disease, unspecified: Secondary | ICD-10-CM | POA: Diagnosis not present

## 2012-05-01 DIAGNOSIS — G589 Mononeuropathy, unspecified: Secondary | ICD-10-CM | POA: Diagnosis not present

## 2012-05-01 DIAGNOSIS — K2100 Gastro-esophageal reflux disease with esophagitis, without bleeding: Secondary | ICD-10-CM | POA: Diagnosis not present

## 2012-05-03 ENCOUNTER — Ambulatory Visit: Payer: Self-pay | Admitting: Pulmonary Disease

## 2012-05-03 DIAGNOSIS — J449 Chronic obstructive pulmonary disease, unspecified: Secondary | ICD-10-CM | POA: Diagnosis not present

## 2012-05-03 DIAGNOSIS — R0602 Shortness of breath: Secondary | ICD-10-CM | POA: Diagnosis not present

## 2012-05-03 LAB — PULMONARY FUNCTION TEST

## 2012-05-07 ENCOUNTER — Encounter: Payer: Self-pay | Admitting: Pulmonary Disease

## 2012-05-08 ENCOUNTER — Telehealth: Payer: Self-pay | Admitting: *Deleted

## 2012-05-08 NOTE — Telephone Encounter (Signed)
Message copied by Christen Butter on Tue May 08, 2012  2:32 PM ------      Message from: Veto Kemps B      Created: Tue May 08, 2012  1:41 PM       L,            Can you let her know that we got her PFT's from [facility] and they just showed [place] what we already know... That she has COPD.            Thanks      B

## 2012-05-08 NOTE — Telephone Encounter (Signed)
Spoke with pt and notified of results per Dr.McQuaid. Pt verbalized understanding and denied any questions. 

## 2012-05-11 ENCOUNTER — Encounter: Payer: Self-pay | Admitting: Pulmonary Disease

## 2012-05-28 ENCOUNTER — Encounter: Payer: Self-pay | Admitting: Pulmonary Disease

## 2012-05-28 DIAGNOSIS — J449 Chronic obstructive pulmonary disease, unspecified: Secondary | ICD-10-CM | POA: Diagnosis not present

## 2012-05-28 DIAGNOSIS — Z5189 Encounter for other specified aftercare: Secondary | ICD-10-CM | POA: Diagnosis not present

## 2012-06-06 DIAGNOSIS — J449 Chronic obstructive pulmonary disease, unspecified: Secondary | ICD-10-CM | POA: Diagnosis not present

## 2012-06-06 DIAGNOSIS — K2100 Gastro-esophageal reflux disease with esophagitis, without bleeding: Secondary | ICD-10-CM | POA: Diagnosis not present

## 2012-06-06 DIAGNOSIS — F411 Generalized anxiety disorder: Secondary | ICD-10-CM | POA: Diagnosis not present

## 2012-06-06 DIAGNOSIS — F329 Major depressive disorder, single episode, unspecified: Secondary | ICD-10-CM | POA: Diagnosis not present

## 2012-06-20 DIAGNOSIS — K2100 Gastro-esophageal reflux disease with esophagitis, without bleeding: Secondary | ICD-10-CM | POA: Diagnosis not present

## 2012-06-20 DIAGNOSIS — J449 Chronic obstructive pulmonary disease, unspecified: Secondary | ICD-10-CM | POA: Diagnosis not present

## 2012-06-20 DIAGNOSIS — I10 Essential (primary) hypertension: Secondary | ICD-10-CM | POA: Diagnosis not present

## 2012-06-20 DIAGNOSIS — F411 Generalized anxiety disorder: Secondary | ICD-10-CM | POA: Diagnosis not present

## 2012-06-28 DIAGNOSIS — L820 Inflamed seborrheic keratosis: Secondary | ICD-10-CM | POA: Diagnosis not present

## 2012-06-28 DIAGNOSIS — L570 Actinic keratosis: Secondary | ICD-10-CM | POA: Diagnosis not present

## 2012-06-28 DIAGNOSIS — L723 Sebaceous cyst: Secondary | ICD-10-CM | POA: Diagnosis not present

## 2012-07-02 ENCOUNTER — Encounter: Payer: Self-pay | Admitting: Pulmonary Disease

## 2012-07-02 DIAGNOSIS — Z5189 Encounter for other specified aftercare: Secondary | ICD-10-CM | POA: Diagnosis not present

## 2012-07-02 DIAGNOSIS — J449 Chronic obstructive pulmonary disease, unspecified: Secondary | ICD-10-CM | POA: Diagnosis not present

## 2012-07-06 DIAGNOSIS — J449 Chronic obstructive pulmonary disease, unspecified: Secondary | ICD-10-CM | POA: Diagnosis not present

## 2012-07-06 DIAGNOSIS — Z5189 Encounter for other specified aftercare: Secondary | ICD-10-CM | POA: Diagnosis not present

## 2012-07-09 DIAGNOSIS — Z7901 Long term (current) use of anticoagulants: Secondary | ICD-10-CM | POA: Diagnosis not present

## 2012-07-09 DIAGNOSIS — I10 Essential (primary) hypertension: Secondary | ICD-10-CM | POA: Diagnosis not present

## 2012-07-09 DIAGNOSIS — Z5189 Encounter for other specified aftercare: Secondary | ICD-10-CM | POA: Diagnosis not present

## 2012-07-09 DIAGNOSIS — E7800 Pure hypercholesterolemia, unspecified: Secondary | ICD-10-CM | POA: Diagnosis not present

## 2012-07-09 DIAGNOSIS — J449 Chronic obstructive pulmonary disease, unspecified: Secondary | ICD-10-CM | POA: Diagnosis not present

## 2012-07-09 DIAGNOSIS — R490 Dysphonia: Secondary | ICD-10-CM | POA: Diagnosis not present

## 2012-07-12 DIAGNOSIS — J301 Allergic rhinitis due to pollen: Secondary | ICD-10-CM | POA: Diagnosis not present

## 2012-07-12 DIAGNOSIS — R490 Dysphonia: Secondary | ICD-10-CM | POA: Diagnosis not present

## 2012-07-12 DIAGNOSIS — K219 Gastro-esophageal reflux disease without esophagitis: Secondary | ICD-10-CM | POA: Diagnosis not present

## 2012-07-16 DIAGNOSIS — E669 Obesity, unspecified: Secondary | ICD-10-CM | POA: Diagnosis not present

## 2012-07-16 DIAGNOSIS — J449 Chronic obstructive pulmonary disease, unspecified: Secondary | ICD-10-CM | POA: Diagnosis not present

## 2012-07-16 DIAGNOSIS — Z5189 Encounter for other specified aftercare: Secondary | ICD-10-CM | POA: Diagnosis not present

## 2012-07-16 DIAGNOSIS — I10 Essential (primary) hypertension: Secondary | ICD-10-CM | POA: Diagnosis not present

## 2012-07-16 DIAGNOSIS — M7989 Other specified soft tissue disorders: Secondary | ICD-10-CM | POA: Diagnosis not present

## 2012-07-16 DIAGNOSIS — I739 Peripheral vascular disease, unspecified: Secondary | ICD-10-CM | POA: Diagnosis not present

## 2012-07-18 DIAGNOSIS — I10 Essential (primary) hypertension: Secondary | ICD-10-CM | POA: Diagnosis not present

## 2012-07-18 DIAGNOSIS — F17200 Nicotine dependence, unspecified, uncomplicated: Secondary | ICD-10-CM | POA: Diagnosis not present

## 2012-07-18 DIAGNOSIS — Z5189 Encounter for other specified aftercare: Secondary | ICD-10-CM | POA: Diagnosis not present

## 2012-07-18 DIAGNOSIS — J449 Chronic obstructive pulmonary disease, unspecified: Secondary | ICD-10-CM | POA: Diagnosis not present

## 2012-07-18 DIAGNOSIS — I70219 Atherosclerosis of native arteries of extremities with intermittent claudication, unspecified extremity: Secondary | ICD-10-CM | POA: Diagnosis not present

## 2012-07-23 DIAGNOSIS — Z5189 Encounter for other specified aftercare: Secondary | ICD-10-CM | POA: Diagnosis not present

## 2012-07-23 DIAGNOSIS — J449 Chronic obstructive pulmonary disease, unspecified: Secondary | ICD-10-CM | POA: Diagnosis not present

## 2012-07-24 ENCOUNTER — Ambulatory Visit: Payer: Self-pay | Admitting: Vascular Surgery

## 2012-07-24 DIAGNOSIS — Z950 Presence of cardiac pacemaker: Secondary | ICD-10-CM | POA: Diagnosis not present

## 2012-07-24 DIAGNOSIS — Z9889 Other specified postprocedural states: Secondary | ICD-10-CM | POA: Diagnosis not present

## 2012-07-24 DIAGNOSIS — E785 Hyperlipidemia, unspecified: Secondary | ICD-10-CM | POA: Diagnosis not present

## 2012-07-24 DIAGNOSIS — I499 Cardiac arrhythmia, unspecified: Secondary | ICD-10-CM | POA: Diagnosis not present

## 2012-07-24 DIAGNOSIS — I70219 Atherosclerosis of native arteries of extremities with intermittent claudication, unspecified extremity: Secondary | ICD-10-CM | POA: Diagnosis not present

## 2012-07-24 DIAGNOSIS — I10 Essential (primary) hypertension: Secondary | ICD-10-CM | POA: Diagnosis not present

## 2012-07-24 DIAGNOSIS — J438 Other emphysema: Secondary | ICD-10-CM | POA: Diagnosis not present

## 2012-07-24 DIAGNOSIS — F17200 Nicotine dependence, unspecified, uncomplicated: Secondary | ICD-10-CM | POA: Diagnosis not present

## 2012-07-24 DIAGNOSIS — Z79899 Other long term (current) drug therapy: Secondary | ICD-10-CM | POA: Diagnosis not present

## 2012-07-24 LAB — CREATININE, SERUM
Creatinine: 1.02 mg/dL (ref 0.60–1.30)
EGFR (African American): >60
EGFR (Non-African Amer.): 56 — ABNORMAL LOW

## 2012-07-29 ENCOUNTER — Encounter: Payer: Self-pay | Admitting: Pulmonary Disease

## 2012-07-29 DIAGNOSIS — J449 Chronic obstructive pulmonary disease, unspecified: Secondary | ICD-10-CM | POA: Diagnosis not present

## 2012-07-29 DIAGNOSIS — Z5189 Encounter for other specified aftercare: Secondary | ICD-10-CM | POA: Diagnosis not present

## 2012-08-09 ENCOUNTER — Telehealth: Payer: Self-pay | Admitting: Pulmonary Disease

## 2012-08-09 DIAGNOSIS — K219 Gastro-esophageal reflux disease without esophagitis: Secondary | ICD-10-CM | POA: Diagnosis not present

## 2012-08-09 DIAGNOSIS — B370 Candidal stomatitis: Secondary | ICD-10-CM | POA: Diagnosis not present

## 2012-08-09 DIAGNOSIS — R490 Dysphonia: Secondary | ICD-10-CM | POA: Diagnosis not present

## 2012-08-09 NOTE — Telephone Encounter (Signed)
I spoke with pt to make sure she is rinsing after using this. She stated she was. I advised her to gargle, rinse and brush her teeth after each use- She voiced her understanding of this. Will forward to Dr. Kendrick Fries as an Lorain Childes

## 2012-08-09 NOTE — Telephone Encounter (Signed)
Sounds good

## 2012-08-20 DIAGNOSIS — I70219 Atherosclerosis of native arteries of extremities with intermittent claudication, unspecified extremity: Secondary | ICD-10-CM | POA: Diagnosis not present

## 2012-08-23 DIAGNOSIS — I10 Essential (primary) hypertension: Secondary | ICD-10-CM | POA: Diagnosis not present

## 2012-08-23 DIAGNOSIS — I441 Atrioventricular block, second degree: Secondary | ICD-10-CM | POA: Diagnosis not present

## 2012-08-28 ENCOUNTER — Encounter: Payer: Self-pay | Admitting: Pulmonary Disease

## 2012-08-30 DIAGNOSIS — B370 Candidal stomatitis: Secondary | ICD-10-CM | POA: Diagnosis not present

## 2012-08-30 DIAGNOSIS — K219 Gastro-esophageal reflux disease without esophagitis: Secondary | ICD-10-CM | POA: Diagnosis not present

## 2012-08-30 DIAGNOSIS — R490 Dysphonia: Secondary | ICD-10-CM | POA: Diagnosis not present

## 2012-09-10 DIAGNOSIS — E785 Hyperlipidemia, unspecified: Secondary | ICD-10-CM | POA: Diagnosis not present

## 2012-09-10 DIAGNOSIS — I10 Essential (primary) hypertension: Secondary | ICD-10-CM | POA: Diagnosis not present

## 2012-09-10 DIAGNOSIS — Z23 Encounter for immunization: Secondary | ICD-10-CM | POA: Diagnosis not present

## 2012-09-10 DIAGNOSIS — J449 Chronic obstructive pulmonary disease, unspecified: Secondary | ICD-10-CM | POA: Diagnosis not present

## 2012-09-10 DIAGNOSIS — IMO0001 Reserved for inherently not codable concepts without codable children: Secondary | ICD-10-CM | POA: Diagnosis not present

## 2012-09-24 ENCOUNTER — Encounter: Payer: Self-pay | Admitting: Pulmonary Disease

## 2012-10-02 DIAGNOSIS — I10 Essential (primary) hypertension: Secondary | ICD-10-CM | POA: Diagnosis not present

## 2012-10-03 ENCOUNTER — Ambulatory Visit: Payer: Self-pay | Admitting: Family Medicine

## 2012-10-03 DIAGNOSIS — R079 Chest pain, unspecified: Secondary | ICD-10-CM | POA: Diagnosis not present

## 2012-10-03 DIAGNOSIS — Z950 Presence of cardiac pacemaker: Secondary | ICD-10-CM | POA: Diagnosis not present

## 2012-10-03 DIAGNOSIS — S2341XA Sprain of ribs, initial encounter: Secondary | ICD-10-CM | POA: Diagnosis not present

## 2012-10-29 DIAGNOSIS — S93409A Sprain of unspecified ligament of unspecified ankle, initial encounter: Secondary | ICD-10-CM | POA: Diagnosis not present

## 2012-10-29 DIAGNOSIS — R609 Edema, unspecified: Secondary | ICD-10-CM | POA: Diagnosis not present

## 2012-10-29 DIAGNOSIS — I10 Essential (primary) hypertension: Secondary | ICD-10-CM | POA: Diagnosis not present

## 2012-10-29 DIAGNOSIS — L0390 Cellulitis, unspecified: Secondary | ICD-10-CM | POA: Diagnosis not present

## 2012-10-29 DIAGNOSIS — L0291 Cutaneous abscess, unspecified: Secondary | ICD-10-CM | POA: Diagnosis not present

## 2012-12-04 ENCOUNTER — Ambulatory Visit: Payer: Medicare Other | Admitting: Pulmonary Disease

## 2012-12-06 DIAGNOSIS — H1045 Other chronic allergic conjunctivitis: Secondary | ICD-10-CM | POA: Diagnosis not present

## 2012-12-10 ENCOUNTER — Encounter: Payer: Self-pay | Admitting: Pulmonary Disease

## 2012-12-10 ENCOUNTER — Ambulatory Visit (INDEPENDENT_AMBULATORY_CARE_PROVIDER_SITE_OTHER): Payer: Medicare Other | Admitting: Pulmonary Disease

## 2012-12-10 VITALS — BP 132/86 | HR 74 | Temp 98.0°F | Ht 64.5 in | Wt 171.8 lb

## 2012-12-10 DIAGNOSIS — I10 Essential (primary) hypertension: Secondary | ICD-10-CM | POA: Diagnosis not present

## 2012-12-10 DIAGNOSIS — J449 Chronic obstructive pulmonary disease, unspecified: Secondary | ICD-10-CM

## 2012-12-10 DIAGNOSIS — Z720 Tobacco use: Secondary | ICD-10-CM

## 2012-12-10 DIAGNOSIS — F17200 Nicotine dependence, unspecified, uncomplicated: Secondary | ICD-10-CM

## 2012-12-10 DIAGNOSIS — E7800 Pure hypercholesterolemia, unspecified: Secondary | ICD-10-CM | POA: Diagnosis not present

## 2012-12-10 DIAGNOSIS — R011 Cardiac murmur, unspecified: Secondary | ICD-10-CM | POA: Diagnosis not present

## 2012-12-10 DIAGNOSIS — J4489 Other specified chronic obstructive pulmonary disease: Secondary | ICD-10-CM

## 2012-12-10 MED ORDER — BUDESONIDE-FORMOTEROL FUMARATE 80-4.5 MCG/ACT IN AERO: 2.0000 | INHALATION_SPRAY | Freq: Two times a day (BID) | RESPIRATORY_TRACT | Status: DC

## 2012-12-10 MED ORDER — VARENICLINE TARTRATE 0.5 MG PO TABS: 0.5000 mg | ORAL_TABLET | Freq: Two times a day (BID) | ORAL | Status: DC

## 2012-12-10 NOTE — Patient Instructions (Signed)
Start taking Chantix again to quit smoking Change your dose of Symbicort to 80-4.5 2 puffs bid We will see you back in 6 months

## 2012-12-10 NOTE — Assessment & Plan Note (Signed)
This is been a very stable interval for Crystal White. She's really needs to quit smoking. She is willing to start taking Chantix again.  Plan:  -flu shot is up-to-date -Continue Symbicort (I adjusted the dose to 80/4.5, 2 puffs twice a day. -Continue exercise on regular basis -Quit smoking, see below

## 2012-12-10 NOTE — Assessment & Plan Note (Signed)
She was able to quit for 3 months while she was on Chantix back in the summertime. However unfortunately she is started back again she is discouraged about this but I encouraged her that I think she should be able to quit permanently. She is willing to try Chantix again.  Plan:  -restart Chantix for 3 months.Marland Kitchen

## 2012-12-10 NOTE — Progress Notes (Signed)
Subjective:    Patient ID: Crystal White, female    DOB: 1941-04-22, 72 y.o.   MRN: [id number]  Synopsis: Crystal White first saw the [facility] [place] [facility] in 01/2012 for COPD.  Her FEV1 was 1.89 L (52%) predicted and her mMRC score was 2.  She smokes 1/2 ppd and was still smoking in May 2013.  HPI   04/13/12 ROV -- Ms. White states that she is feeling great and is happy to have recovered well from her recent injuries that plagued her back in the winter.  She is using her inhalers regularly.  She still wants to increase her exercise tolerance and notes that she would like to start pulmonary rehab next week.  She is still smoking 1/2 ppd and wants to quit.  She has an upcoming road trip with her children and grandchildren and she really wants to quit before then.  Her cough and dyspnea are better than when we saw her last.     12/10/12 ROV-- Crystal White has been doing well since her last visit. She continues to use the Symbicort twice a day. She has been using the 160/4.5 dose at 2 puffs twice a day. She was able to quit smoking for 3 months back in the summertime after using Chantix. She would like to try to use it again to quit. Otherwise her shortness of breath has been at baseline and she has been able to keep up with all of her activities of daily living at home.   Past Medical History  Diagnosis Date  . Hypertension   . COPD (chronic obstructive pulmonary disease)   . Hyperlipidemia   . OSA (obstructive sleep apnea)       Review of Systems  Constitutional: Negative for fever, appetite change and unexpected weight change.  HENT: Negative for congestion, rhinorrhea, sneezing, trouble swallowing, postnasal drip and sinus pressure.   Respiratory: Positive for shortness of breath. Negative for cough, chest tightness and wheezing.        Objective:   Physical Exam   Filed Vitals:   12/10/12 0834  BP: 132/86  Pulse: 74  Temp: 98 F (36.7 C)  TempSrc: Oral  Height: 5' 4.5"  (1.638 m)  Weight: 171 lb 12.8 oz (77.928 kg)  SpO2: 94%   Gen: well appearing, no acute distress HEENT: NCAT, PERRL, EOMi,  PULM: CTA B CV: RRR, no mgr, no JVD AB: BS+, soft, nontender, no hsm Ext: warm, no edema, no clubbing, no cyanosis; R arm in sling   2010 Spirometry: Ratio not reported but flow volume loop shows clear obstruction; FEV1 1.89 L or 52% predicted     Assessment & Plan:   COPD (chronic obstructive pulmonary disease) This is been a very stable interval for Crystal White. She's really needs to quit smoking. She is willing to start taking Chantix again.  Plan:  -flu shot is up-to-date -Continue Symbicort (I adjusted the dose to 80/4.5, 2 puffs twice a day. -Continue exercise on regular basis -Quit smoking, see below  Tobacco abuse She was able to quit for 3 months while she was on Chantix back in the summertime. However unfortunately she is started back again she is discouraged about this but I encouraged her that I think she should be able to quit permanently. She is willing to try Chantix again.  Plan:  -restart Chantix for 3 months.Marland Kitchen    Updated Medication List Outpatient Encounter Prescriptions as of 12/10/2012  Medication Sig Dispense Refill  . albuterol (PROVENTIL HFA)  108 (90 BASE) MCG/ACT inhaler Inhale 2 puffs into the lungs every 6 (six) hours as needed.      . ALPRAZolam (XANAX) 0.25 MG tablet Take 0.25 mg by mouth as needed.       . budesonide-formoterol (SYMBICORT) 160-4.5 MCG/ACT inhaler Inhale 1 puff into the lungs 2 (two) times daily.       Marland Kitchen gabapentin (NEURONTIN) 100 MG capsule Take 100 mg by mouth 3 (three) times daily.      . hydrochlorothiazide (HYDRODIURIL) 25 MG tablet Take 25 mg by mouth daily.      . mirtazapine (REMERON) 30 MG tablet Take 30 mg by mouth at bedtime.      . Multiple Vitamin (MULTIVITAMIN) capsule Take 1 capsule by mouth daily.      Marland Kitchen omeprazole (PRILOSEC) 40 MG capsule Take 40 mg by mouth daily.      . pregabalin  (LYRICA) 50 MG capsule Take 50 mg by mouth daily.      . traZODone (DESYREL) 150 MG tablet Take 150 mg by mouth at bedtime.      Marland Kitchen zolpidem (AMBIEN) 10 MG tablet Take 5 mg by mouth at bedtime.       . [DISCONTINUED] mometasone (NASONEX) 50 MCG/ACT nasal spray Place 2 sprays into the nose daily as needed.       . [DISCONTINUED] oxyCODONE-acetaminophen (PERCOCET) 5-325 MG per tablet Take 1 tablet by mouth every 6 (six) hours as needed.

## 2012-12-21 ENCOUNTER — Ambulatory Visit: Payer: Self-pay

## 2012-12-21 DIAGNOSIS — J40 Bronchitis, not specified as acute or chronic: Secondary | ICD-10-CM | POA: Diagnosis not present

## 2012-12-21 DIAGNOSIS — R011 Cardiac murmur, unspecified: Secondary | ICD-10-CM | POA: Diagnosis not present

## 2012-12-21 DIAGNOSIS — J3489 Other specified disorders of nose and nasal sinuses: Secondary | ICD-10-CM | POA: Diagnosis not present

## 2012-12-21 DIAGNOSIS — I10 Essential (primary) hypertension: Secondary | ICD-10-CM | POA: Diagnosis not present

## 2012-12-21 DIAGNOSIS — R059 Cough, unspecified: Secondary | ICD-10-CM | POA: Diagnosis not present

## 2012-12-21 DIAGNOSIS — J449 Chronic obstructive pulmonary disease, unspecified: Secondary | ICD-10-CM | POA: Diagnosis not present

## 2012-12-21 DIAGNOSIS — R0989 Other specified symptoms and signs involving the circulatory and respiratory systems: Secondary | ICD-10-CM | POA: Diagnosis not present

## 2012-12-24 ENCOUNTER — Ambulatory Visit: Payer: Self-pay | Admitting: Family Medicine

## 2012-12-24 DIAGNOSIS — I10 Essential (primary) hypertension: Secondary | ICD-10-CM | POA: Diagnosis not present

## 2012-12-24 DIAGNOSIS — H26499 Other secondary cataract, unspecified eye: Secondary | ICD-10-CM | POA: Diagnosis not present

## 2012-12-24 DIAGNOSIS — Z79899 Other long term (current) drug therapy: Secondary | ICD-10-CM | POA: Diagnosis not present

## 2012-12-24 DIAGNOSIS — E7800 Pure hypercholesterolemia, unspecified: Secondary | ICD-10-CM | POA: Diagnosis not present

## 2012-12-24 DIAGNOSIS — R5381 Other malaise: Secondary | ICD-10-CM | POA: Diagnosis not present

## 2012-12-24 LAB — LIPID PANEL
Cholesterol: 230 mg/dL — ABNORMAL HIGH (ref 0–200)
HDL Cholesterol: 42 mg/dL (ref 40–60)
Ldl Cholesterol, Calc: 132 mg/dL — ABNORMAL HIGH (ref 0–100)
Triglycerides: 279 mg/dL — ABNORMAL HIGH (ref 0–200)
VLDL Cholesterol, Calc: 56 mg/dL — ABNORMAL HIGH (ref 5–40)

## 2012-12-24 LAB — CBC WITH DIFFERENTIAL/PLATELET
Basophil #: 0 10*3/uL (ref 0.0–0.1)
Basophil %: 0.4 %
Eosinophil %: 1 %
HCT: 49.9 % — ABNORMAL HIGH (ref 35.0–47.0)
HGB: 16.3 g/dL — ABNORMAL HIGH (ref 12.0–16.0)
Lymphocyte %: 13.3 %
MCH: 28.3 pg (ref 26.0–34.0)
MCHC: 32.6 g/dL (ref 32.0–36.0)
MCV: 87 fL (ref 80–100)
Neutrophil %: 80.4 %
Platelet: 218 10*3/uL (ref 150–440)
RBC: 5.74 10*6/uL — ABNORMAL HIGH (ref 3.80–5.20)
WBC: 10.5 10*3/uL (ref 3.6–11.0)

## 2012-12-24 LAB — COMPREHENSIVE METABOLIC PANEL
Anion Gap: 11 (ref 7–16)
BUN: 19 mg/dL — ABNORMAL HIGH (ref 7–18)
Calcium, Total: 9.6 mg/dL (ref 8.5–10.1)
Chloride: 101 mmol/L (ref 98–107)
Creatinine: 1.36 mg/dL — ABNORMAL HIGH (ref 0.60–1.30)
EGFR (Non-African Amer.): 39 — ABNORMAL LOW
Osmolality: 284 (ref 275–301)
Potassium: 3.9 mmol/L (ref 3.5–5.1)
SGOT(AST): 17 U/L (ref 15–37)
SGPT (ALT): 21 U/L (ref 12–78)
Sodium: 141 mmol/L (ref 136–145)

## 2012-12-24 LAB — TSH: Thyroid Stimulating Horm: 1.56 u[IU]/mL

## 2013-02-25 DIAGNOSIS — I441 Atrioventricular block, second degree: Secondary | ICD-10-CM | POA: Diagnosis not present

## 2013-02-25 DIAGNOSIS — R0989 Other specified symptoms and signs involving the circulatory and respiratory systems: Secondary | ICD-10-CM | POA: Diagnosis not present

## 2013-02-25 DIAGNOSIS — I10 Essential (primary) hypertension: Secondary | ICD-10-CM | POA: Diagnosis not present

## 2013-02-25 DIAGNOSIS — R0609 Other forms of dyspnea: Secondary | ICD-10-CM | POA: Diagnosis not present

## 2013-02-25 DIAGNOSIS — J449 Chronic obstructive pulmonary disease, unspecified: Secondary | ICD-10-CM | POA: Diagnosis not present

## 2013-04-18 DIAGNOSIS — D485 Neoplasm of uncertain behavior of skin: Secondary | ICD-10-CM | POA: Diagnosis not present

## 2013-04-18 DIAGNOSIS — L570 Actinic keratosis: Secondary | ICD-10-CM | POA: Diagnosis not present

## 2013-04-18 DIAGNOSIS — L821 Other seborrheic keratosis: Secondary | ICD-10-CM | POA: Diagnosis not present

## 2013-04-25 DIAGNOSIS — L98499 Non-pressure chronic ulcer of skin of other sites with unspecified severity: Secondary | ICD-10-CM | POA: Diagnosis not present

## 2013-04-25 DIAGNOSIS — I441 Atrioventricular block, second degree: Secondary | ICD-10-CM | POA: Diagnosis not present

## 2013-04-25 DIAGNOSIS — L570 Actinic keratosis: Secondary | ICD-10-CM | POA: Diagnosis not present

## 2013-04-28 ENCOUNTER — Ambulatory Visit: Payer: Self-pay | Admitting: Family Medicine

## 2013-04-28 DIAGNOSIS — IMO0001 Reserved for inherently not codable concepts without codable children: Secondary | ICD-10-CM | POA: Diagnosis not present

## 2013-04-28 DIAGNOSIS — I10 Essential (primary) hypertension: Secondary | ICD-10-CM | POA: Diagnosis not present

## 2013-04-28 DIAGNOSIS — K219 Gastro-esophageal reflux disease without esophagitis: Secondary | ICD-10-CM | POA: Diagnosis not present

## 2013-04-28 DIAGNOSIS — E7800 Pure hypercholesterolemia, unspecified: Secondary | ICD-10-CM | POA: Diagnosis not present

## 2013-04-28 DIAGNOSIS — R319 Hematuria, unspecified: Secondary | ICD-10-CM | POA: Diagnosis not present

## 2013-04-28 DIAGNOSIS — M129 Arthropathy, unspecified: Secondary | ICD-10-CM | POA: Diagnosis not present

## 2013-04-28 LAB — URINALYSIS, COMPLETE
Bilirubin,UR: NEGATIVE
Ketone: NEGATIVE
Nitrite: NEGATIVE
Specific Gravity: 1.02 (ref 1.003–1.030)

## 2013-05-13 DIAGNOSIS — R12 Heartburn: Secondary | ICD-10-CM | POA: Diagnosis not present

## 2013-05-13 DIAGNOSIS — Z86010 Personal history of colonic polyps: Secondary | ICD-10-CM | POA: Diagnosis not present

## 2013-05-13 DIAGNOSIS — R1310 Dysphagia, unspecified: Secondary | ICD-10-CM | POA: Diagnosis not present

## 2013-05-13 DIAGNOSIS — K2270 Barrett's esophagus without dysplasia: Secondary | ICD-10-CM | POA: Diagnosis not present

## 2013-05-22 DIAGNOSIS — L570 Actinic keratosis: Secondary | ICD-10-CM | POA: Diagnosis not present

## 2013-05-27 ENCOUNTER — Ambulatory Visit: Payer: Self-pay | Admitting: Gastroenterology

## 2013-05-27 DIAGNOSIS — Z950 Presence of cardiac pacemaker: Secondary | ICD-10-CM | POA: Diagnosis not present

## 2013-05-27 DIAGNOSIS — K2270 Barrett's esophagus without dysplasia: Secondary | ICD-10-CM | POA: Diagnosis not present

## 2013-05-27 DIAGNOSIS — Z79899 Other long term (current) drug therapy: Secondary | ICD-10-CM | POA: Diagnosis not present

## 2013-05-27 DIAGNOSIS — G4730 Sleep apnea, unspecified: Secondary | ICD-10-CM | POA: Diagnosis not present

## 2013-05-27 DIAGNOSIS — I10 Essential (primary) hypertension: Secondary | ICD-10-CM | POA: Diagnosis not present

## 2013-05-27 DIAGNOSIS — K2289 Other specified disease of esophagus: Secondary | ICD-10-CM | POA: Diagnosis not present

## 2013-05-27 DIAGNOSIS — R12 Heartburn: Secondary | ICD-10-CM | POA: Diagnosis not present

## 2013-05-27 DIAGNOSIS — K229 Disease of esophagus, unspecified: Secondary | ICD-10-CM | POA: Diagnosis not present

## 2013-05-27 DIAGNOSIS — Z91041 Radiographic dye allergy status: Secondary | ICD-10-CM | POA: Diagnosis not present

## 2013-05-27 DIAGNOSIS — K224 Dyskinesia of esophagus: Secondary | ICD-10-CM | POA: Diagnosis not present

## 2013-05-27 DIAGNOSIS — Q398 Other congenital malformations of esophagus: Secondary | ICD-10-CM | POA: Diagnosis not present

## 2013-05-27 DIAGNOSIS — R1310 Dysphagia, unspecified: Secondary | ICD-10-CM | POA: Diagnosis not present

## 2013-06-10 ENCOUNTER — Ambulatory Visit: Payer: Medicare Other | Admitting: Pulmonary Disease

## 2013-06-11 DIAGNOSIS — F411 Generalized anxiety disorder: Secondary | ICD-10-CM | POA: Diagnosis not present

## 2013-06-11 DIAGNOSIS — I10 Essential (primary) hypertension: Secondary | ICD-10-CM | POA: Diagnosis not present

## 2013-06-11 DIAGNOSIS — K2100 Gastro-esophageal reflux disease with esophagitis, without bleeding: Secondary | ICD-10-CM | POA: Diagnosis not present

## 2013-06-11 DIAGNOSIS — F329 Major depressive disorder, single episode, unspecified: Secondary | ICD-10-CM | POA: Diagnosis not present

## 2013-06-26 DIAGNOSIS — R1310 Dysphagia, unspecified: Secondary | ICD-10-CM | POA: Diagnosis not present

## 2013-07-11 DIAGNOSIS — L570 Actinic keratosis: Secondary | ICD-10-CM | POA: Diagnosis not present

## 2013-07-19 DIAGNOSIS — K2100 Gastro-esophageal reflux disease with esophagitis, without bleeding: Secondary | ICD-10-CM | POA: Diagnosis not present

## 2013-07-19 DIAGNOSIS — I10 Essential (primary) hypertension: Secondary | ICD-10-CM | POA: Diagnosis not present

## 2013-07-19 DIAGNOSIS — L530 Toxic erythema: Secondary | ICD-10-CM | POA: Diagnosis not present

## 2013-07-19 DIAGNOSIS — F329 Major depressive disorder, single episode, unspecified: Secondary | ICD-10-CM | POA: Diagnosis not present

## 2013-07-19 LAB — POCT ERYTHROCYTE SEDIMENTATION RATE, NON-AUTOMATED: SED RATE: 8 mm

## 2013-07-22 DIAGNOSIS — E782 Mixed hyperlipidemia: Secondary | ICD-10-CM | POA: Diagnosis not present

## 2013-07-22 DIAGNOSIS — I441 Atrioventricular block, second degree: Secondary | ICD-10-CM | POA: Diagnosis not present

## 2013-07-22 DIAGNOSIS — I10 Essential (primary) hypertension: Secondary | ICD-10-CM | POA: Diagnosis not present

## 2013-07-30 DIAGNOSIS — G609 Hereditary and idiopathic neuropathy, unspecified: Secondary | ICD-10-CM | POA: Diagnosis not present

## 2013-07-30 DIAGNOSIS — R21 Rash and other nonspecific skin eruption: Secondary | ICD-10-CM | POA: Diagnosis not present

## 2013-07-30 DIAGNOSIS — J449 Chronic obstructive pulmonary disease, unspecified: Secondary | ICD-10-CM | POA: Diagnosis not present

## 2013-07-30 DIAGNOSIS — R609 Edema, unspecified: Secondary | ICD-10-CM | POA: Diagnosis not present

## 2013-08-07 DIAGNOSIS — R1310 Dysphagia, unspecified: Secondary | ICD-10-CM | POA: Diagnosis not present

## 2013-08-07 DIAGNOSIS — L539 Erythematous condition, unspecified: Secondary | ICD-10-CM | POA: Diagnosis not present

## 2013-08-07 DIAGNOSIS — L530 Toxic erythema: Secondary | ICD-10-CM | POA: Diagnosis not present

## 2013-08-12 DIAGNOSIS — I7381 Erythromelalgia: Secondary | ICD-10-CM | POA: Diagnosis not present

## 2013-08-19 DIAGNOSIS — R1310 Dysphagia, unspecified: Secondary | ICD-10-CM | POA: Diagnosis not present

## 2013-08-19 DIAGNOSIS — Z23 Encounter for immunization: Secondary | ICD-10-CM | POA: Diagnosis not present

## 2013-08-20 DIAGNOSIS — M3390 Dermatopolymyositis, unspecified, organ involvement unspecified: Secondary | ICD-10-CM | POA: Diagnosis not present

## 2013-08-20 DIAGNOSIS — R21 Rash and other nonspecific skin eruption: Secondary | ICD-10-CM | POA: Diagnosis not present

## 2013-08-28 DIAGNOSIS — F17200 Nicotine dependence, unspecified, uncomplicated: Secondary | ICD-10-CM | POA: Diagnosis not present

## 2013-08-28 DIAGNOSIS — K2970 Gastritis, unspecified, without bleeding: Secondary | ICD-10-CM | POA: Diagnosis not present

## 2013-08-28 DIAGNOSIS — Z79899 Other long term (current) drug therapy: Secondary | ICD-10-CM | POA: Diagnosis not present

## 2013-08-28 DIAGNOSIS — Z91041 Radiographic dye allergy status: Secondary | ICD-10-CM | POA: Diagnosis not present

## 2013-08-28 DIAGNOSIS — R1310 Dysphagia, unspecified: Secondary | ICD-10-CM | POA: Diagnosis not present

## 2013-08-28 DIAGNOSIS — K219 Gastro-esophageal reflux disease without esophagitis: Secondary | ICD-10-CM | POA: Diagnosis not present

## 2013-08-28 DIAGNOSIS — K2940 Chronic atrophic gastritis without bleeding: Secondary | ICD-10-CM | POA: Diagnosis not present

## 2013-08-28 DIAGNOSIS — Z950 Presence of cardiac pacemaker: Secondary | ICD-10-CM | POA: Diagnosis not present

## 2013-08-28 DIAGNOSIS — I10 Essential (primary) hypertension: Secondary | ICD-10-CM | POA: Diagnosis not present

## 2013-08-28 DIAGNOSIS — Z9889 Other specified postprocedural states: Secondary | ICD-10-CM | POA: Diagnosis not present

## 2013-08-28 DIAGNOSIS — K222 Esophageal obstruction: Secondary | ICD-10-CM | POA: Diagnosis not present

## 2013-08-28 DIAGNOSIS — G4733 Obstructive sleep apnea (adult) (pediatric): Secondary | ICD-10-CM | POA: Diagnosis not present

## 2013-08-28 DIAGNOSIS — J449 Chronic obstructive pulmonary disease, unspecified: Secondary | ICD-10-CM | POA: Diagnosis not present

## 2013-08-28 DIAGNOSIS — G2581 Restless legs syndrome: Secondary | ICD-10-CM | POA: Diagnosis not present

## 2013-08-28 DIAGNOSIS — F411 Generalized anxiety disorder: Secondary | ICD-10-CM | POA: Diagnosis not present

## 2013-10-14 ENCOUNTER — Ambulatory Visit: Payer: Self-pay

## 2013-10-14 DIAGNOSIS — K219 Gastro-esophageal reflux disease without esophagitis: Secondary | ICD-10-CM | POA: Diagnosis not present

## 2013-10-14 DIAGNOSIS — F17200 Nicotine dependence, unspecified, uncomplicated: Secondary | ICD-10-CM | POA: Diagnosis not present

## 2013-10-14 DIAGNOSIS — N390 Urinary tract infection, site not specified: Secondary | ICD-10-CM | POA: Diagnosis not present

## 2013-10-14 DIAGNOSIS — I10 Essential (primary) hypertension: Secondary | ICD-10-CM | POA: Diagnosis not present

## 2013-10-14 DIAGNOSIS — E7800 Pure hypercholesterolemia, unspecified: Secondary | ICD-10-CM | POA: Diagnosis not present

## 2013-10-14 DIAGNOSIS — Z79899 Other long term (current) drug therapy: Secondary | ICD-10-CM | POA: Diagnosis not present

## 2013-10-14 DIAGNOSIS — Z9089 Acquired absence of other organs: Secondary | ICD-10-CM | POA: Diagnosis not present

## 2013-10-14 LAB — URINALYSIS, COMPLETE
Ketone: NEGATIVE
Nitrite: POSITIVE
Specific Gravity: >=1.03 (ref 1.003–1.030)

## 2013-10-16 DIAGNOSIS — R0989 Other specified symptoms and signs involving the circulatory and respiratory systems: Secondary | ICD-10-CM | POA: Diagnosis not present

## 2013-10-16 DIAGNOSIS — I10 Essential (primary) hypertension: Secondary | ICD-10-CM | POA: Diagnosis not present

## 2013-10-16 DIAGNOSIS — I441 Atrioventricular block, second degree: Secondary | ICD-10-CM | POA: Diagnosis not present

## 2013-10-16 DIAGNOSIS — J449 Chronic obstructive pulmonary disease, unspecified: Secondary | ICD-10-CM | POA: Diagnosis not present

## 2013-10-16 DIAGNOSIS — R0609 Other forms of dyspnea: Secondary | ICD-10-CM | POA: Diagnosis not present

## 2013-10-16 LAB — URINE CULTURE

## 2013-10-23 DIAGNOSIS — Z1330 Encounter for screening examination for mental health and behavioral disorders, unspecified: Secondary | ICD-10-CM | POA: Diagnosis not present

## 2013-10-23 DIAGNOSIS — R5381 Other malaise: Secondary | ICD-10-CM | POA: Diagnosis not present

## 2013-10-23 DIAGNOSIS — R1310 Dysphagia, unspecified: Secondary | ICD-10-CM | POA: Diagnosis not present

## 2013-10-23 DIAGNOSIS — F329 Major depressive disorder, single episode, unspecified: Secondary | ICD-10-CM | POA: Diagnosis not present

## 2013-10-23 DIAGNOSIS — F321 Major depressive disorder, single episode, moderate: Secondary | ICD-10-CM | POA: Diagnosis not present

## 2013-10-23 DIAGNOSIS — F17200 Nicotine dependence, unspecified, uncomplicated: Secondary | ICD-10-CM | POA: Diagnosis not present

## 2013-10-23 DIAGNOSIS — K2100 Gastro-esophageal reflux disease with esophagitis, without bleeding: Secondary | ICD-10-CM | POA: Diagnosis not present

## 2013-10-23 DIAGNOSIS — R42 Dizziness and giddiness: Secondary | ICD-10-CM | POA: Diagnosis not present

## 2013-10-23 DIAGNOSIS — R252 Cramp and spasm: Secondary | ICD-10-CM | POA: Diagnosis not present

## 2013-10-23 DIAGNOSIS — N390 Urinary tract infection, site not specified: Secondary | ICD-10-CM | POA: Diagnosis not present

## 2013-11-07 DIAGNOSIS — F321 Major depressive disorder, single episode, moderate: Secondary | ICD-10-CM | POA: Diagnosis not present

## 2013-11-07 DIAGNOSIS — N390 Urinary tract infection, site not specified: Secondary | ICD-10-CM | POA: Diagnosis not present

## 2013-11-07 DIAGNOSIS — F17200 Nicotine dependence, unspecified, uncomplicated: Secondary | ICD-10-CM | POA: Diagnosis not present

## 2013-11-07 DIAGNOSIS — K2100 Gastro-esophageal reflux disease with esophagitis, without bleeding: Secondary | ICD-10-CM | POA: Diagnosis not present

## 2013-11-15 DIAGNOSIS — F17200 Nicotine dependence, unspecified, uncomplicated: Secondary | ICD-10-CM | POA: Diagnosis not present

## 2013-11-15 DIAGNOSIS — F321 Major depressive disorder, single episode, moderate: Secondary | ICD-10-CM | POA: Diagnosis not present

## 2013-11-15 DIAGNOSIS — N390 Urinary tract infection, site not specified: Secondary | ICD-10-CM | POA: Diagnosis not present

## 2013-11-15 DIAGNOSIS — K2100 Gastro-esophageal reflux disease with esophagitis, without bleeding: Secondary | ICD-10-CM | POA: Diagnosis not present

## 2013-11-26 DIAGNOSIS — I441 Atrioventricular block, second degree: Secondary | ICD-10-CM | POA: Diagnosis not present

## 2013-12-03 DIAGNOSIS — N952 Postmenopausal atrophic vaginitis: Secondary | ICD-10-CM | POA: Diagnosis not present

## 2013-12-03 DIAGNOSIS — R109 Unspecified abdominal pain: Secondary | ICD-10-CM | POA: Diagnosis not present

## 2013-12-03 DIAGNOSIS — N390 Urinary tract infection, site not specified: Secondary | ICD-10-CM | POA: Diagnosis not present

## 2013-12-09 ENCOUNTER — Ambulatory Visit: Payer: Self-pay | Admitting: Urology

## 2013-12-09 DIAGNOSIS — R109 Unspecified abdominal pain: Secondary | ICD-10-CM | POA: Diagnosis not present

## 2013-12-09 DIAGNOSIS — E278 Other specified disorders of adrenal gland: Secondary | ICD-10-CM | POA: Diagnosis not present

## 2013-12-11 DIAGNOSIS — D3500 Benign neoplasm of unspecified adrenal gland: Secondary | ICD-10-CM | POA: Diagnosis not present

## 2013-12-11 DIAGNOSIS — N390 Urinary tract infection, site not specified: Secondary | ICD-10-CM | POA: Diagnosis not present

## 2013-12-11 DIAGNOSIS — N952 Postmenopausal atrophic vaginitis: Secondary | ICD-10-CM | POA: Diagnosis not present

## 2013-12-12 DIAGNOSIS — F320 Major depressive disorder, single episode, mild: Secondary | ICD-10-CM | POA: Diagnosis not present

## 2013-12-12 DIAGNOSIS — K2100 Gastro-esophageal reflux disease with esophagitis, without bleeding: Secondary | ICD-10-CM | POA: Diagnosis not present

## 2013-12-12 DIAGNOSIS — J449 Chronic obstructive pulmonary disease, unspecified: Secondary | ICD-10-CM | POA: Diagnosis not present

## 2013-12-12 DIAGNOSIS — R5381 Other malaise: Secondary | ICD-10-CM | POA: Diagnosis not present

## 2013-12-12 DIAGNOSIS — R5383 Other fatigue: Secondary | ICD-10-CM | POA: Diagnosis not present

## 2013-12-12 DIAGNOSIS — F17200 Nicotine dependence, unspecified, uncomplicated: Secondary | ICD-10-CM | POA: Diagnosis not present

## 2014-01-08 DIAGNOSIS — R1310 Dysphagia, unspecified: Secondary | ICD-10-CM | POA: Diagnosis not present

## 2014-02-18 DIAGNOSIS — R1310 Dysphagia, unspecified: Secondary | ICD-10-CM | POA: Diagnosis not present

## 2014-02-25 DIAGNOSIS — Z09 Encounter for follow-up examination after completed treatment for conditions other than malignant neoplasm: Secondary | ICD-10-CM | POA: Diagnosis not present

## 2014-02-25 DIAGNOSIS — R1310 Dysphagia, unspecified: Secondary | ICD-10-CM | POA: Diagnosis not present

## 2014-03-07 DIAGNOSIS — I10 Essential (primary) hypertension: Secondary | ICD-10-CM | POA: Diagnosis not present

## 2014-03-07 DIAGNOSIS — I441 Atrioventricular block, second degree: Secondary | ICD-10-CM | POA: Diagnosis not present

## 2014-03-07 DIAGNOSIS — E782 Mixed hyperlipidemia: Secondary | ICD-10-CM | POA: Diagnosis not present

## 2014-03-10 DIAGNOSIS — Z9889 Other specified postprocedural states: Secondary | ICD-10-CM | POA: Diagnosis not present

## 2014-03-10 DIAGNOSIS — R1310 Dysphagia, unspecified: Secondary | ICD-10-CM | POA: Diagnosis not present

## 2014-03-18 DIAGNOSIS — D0470 Carcinoma in situ of skin of unspecified lower limb, including hip: Secondary | ICD-10-CM | POA: Diagnosis not present

## 2014-03-18 DIAGNOSIS — C44621 Squamous cell carcinoma of skin of unspecified upper limb, including shoulder: Secondary | ICD-10-CM | POA: Diagnosis not present

## 2014-03-18 DIAGNOSIS — L821 Other seborrheic keratosis: Secondary | ICD-10-CM | POA: Diagnosis not present

## 2014-03-18 DIAGNOSIS — L723 Sebaceous cyst: Secondary | ICD-10-CM | POA: Diagnosis not present

## 2014-03-18 DIAGNOSIS — L570 Actinic keratosis: Secondary | ICD-10-CM | POA: Diagnosis not present

## 2014-03-18 DIAGNOSIS — D485 Neoplasm of uncertain behavior of skin: Secondary | ICD-10-CM | POA: Diagnosis not present

## 2014-03-19 DIAGNOSIS — R05 Cough: Secondary | ICD-10-CM | POA: Diagnosis not present

## 2014-03-19 DIAGNOSIS — F411 Generalized anxiety disorder: Secondary | ICD-10-CM | POA: Diagnosis not present

## 2014-03-19 DIAGNOSIS — R5381 Other malaise: Secondary | ICD-10-CM | POA: Diagnosis not present

## 2014-03-19 DIAGNOSIS — F17200 Nicotine dependence, unspecified, uncomplicated: Secondary | ICD-10-CM | POA: Diagnosis not present

## 2014-03-19 DIAGNOSIS — J42 Unspecified chronic bronchitis: Secondary | ICD-10-CM | POA: Diagnosis not present

## 2014-03-19 DIAGNOSIS — R059 Cough, unspecified: Secondary | ICD-10-CM | POA: Diagnosis not present

## 2014-04-14 DIAGNOSIS — C44621 Squamous cell carcinoma of skin of unspecified upper limb, including shoulder: Secondary | ICD-10-CM | POA: Diagnosis not present

## 2014-04-14 DIAGNOSIS — D0470 Carcinoma in situ of skin of unspecified lower limb, including hip: Secondary | ICD-10-CM | POA: Diagnosis not present

## 2014-04-14 DIAGNOSIS — C44721 Squamous cell carcinoma of skin of unspecified lower limb, including hip: Secondary | ICD-10-CM | POA: Diagnosis not present

## 2014-04-17 DIAGNOSIS — I441 Atrioventricular block, second degree: Secondary | ICD-10-CM | POA: Diagnosis not present

## 2014-04-29 DIAGNOSIS — L570 Actinic keratosis: Secondary | ICD-10-CM | POA: Diagnosis not present

## 2014-08-28 DIAGNOSIS — R5383 Other fatigue: Secondary | ICD-10-CM | POA: Diagnosis not present

## 2014-08-28 DIAGNOSIS — K219 Gastro-esophageal reflux disease without esophagitis: Secondary | ICD-10-CM | POA: Diagnosis not present

## 2014-08-28 DIAGNOSIS — Z720 Tobacco use: Secondary | ICD-10-CM | POA: Diagnosis not present

## 2014-08-28 DIAGNOSIS — G4700 Insomnia, unspecified: Secondary | ICD-10-CM | POA: Diagnosis not present

## 2014-08-28 DIAGNOSIS — I10 Essential (primary) hypertension: Secondary | ICD-10-CM | POA: Diagnosis not present

## 2014-08-28 DIAGNOSIS — E780 Pure hypercholesterolemia: Secondary | ICD-10-CM | POA: Diagnosis not present

## 2014-08-28 DIAGNOSIS — Z23 Encounter for immunization: Secondary | ICD-10-CM | POA: Diagnosis not present

## 2014-08-28 DIAGNOSIS — J449 Chronic obstructive pulmonary disease, unspecified: Secondary | ICD-10-CM | POA: Diagnosis not present

## 2014-08-28 DIAGNOSIS — F411 Generalized anxiety disorder: Secondary | ICD-10-CM | POA: Diagnosis not present

## 2014-08-28 LAB — HM COLONOSCOPY

## 2014-08-28 LAB — CBC AND DIFFERENTIAL
HCT: 46 % (ref 36–46)
Hemoglobin: 16.3 g/dL — AB (ref 12.0–16.0)
Neutrophils Absolute: 69 /uL
Platelets: 290 10*3/uL (ref 150–399)
WBC: 10.4 10^3/mL

## 2014-08-28 LAB — HEPATIC FUNCTION PANEL
ALT: 9 U/L (ref 7–35)
AST: 18 U/L (ref 13–35)
Alkaline Phosphatase: 105 U/L (ref 25–125)
Bilirubin, Total: 0.3 mg/dL

## 2014-08-28 LAB — TSH: TSH: 1.41 u[IU]/mL (ref 0.41–5.90)

## 2014-08-28 LAB — LIPID PANEL
Cholesterol: 288 mg/dL — AB (ref 0–200)
HDL: 49 mg/dL (ref 35–70)
LDL Cholesterol: 189 mg/dL
Triglycerides: 252 mg/dL — AB (ref 40–160)

## 2014-08-28 LAB — BASIC METABOLIC PANEL
BUN: 18 mg/dL (ref 4–21)
Creatinine: 1.1 mg/dL (ref 0.5–1.1)
Glucose: 105 mg/dL
Potassium: 4.9 mmol/L (ref 3.4–5.3)
SODIUM: 142 mmol/L (ref 137–147)

## 2014-09-17 DIAGNOSIS — R140 Abdominal distension (gaseous): Secondary | ICD-10-CM | POA: Diagnosis not present

## 2014-09-17 DIAGNOSIS — R112 Nausea with vomiting, unspecified: Secondary | ICD-10-CM | POA: Diagnosis not present

## 2014-09-18 ENCOUNTER — Ambulatory Visit: Payer: Self-pay | Admitting: Gastroenterology

## 2014-09-18 DIAGNOSIS — R112 Nausea with vomiting, unspecified: Secondary | ICD-10-CM | POA: Diagnosis not present

## 2014-09-18 DIAGNOSIS — R140 Abdominal distension (gaseous): Secondary | ICD-10-CM | POA: Diagnosis not present

## 2014-09-18 DIAGNOSIS — R1110 Vomiting, unspecified: Secondary | ICD-10-CM | POA: Diagnosis not present

## 2014-10-08 DIAGNOSIS — G4730 Sleep apnea, unspecified: Secondary | ICD-10-CM | POA: Insufficient documentation

## 2014-10-08 DIAGNOSIS — E785 Hyperlipidemia, unspecified: Secondary | ICD-10-CM | POA: Insufficient documentation

## 2014-10-08 DIAGNOSIS — Z950 Presence of cardiac pacemaker: Secondary | ICD-10-CM | POA: Insufficient documentation

## 2014-10-09 DIAGNOSIS — Z950 Presence of cardiac pacemaker: Secondary | ICD-10-CM | POA: Diagnosis not present

## 2014-10-09 DIAGNOSIS — E780 Pure hypercholesterolemia: Secondary | ICD-10-CM | POA: Diagnosis not present

## 2014-10-09 DIAGNOSIS — I442 Atrioventricular block, complete: Secondary | ICD-10-CM | POA: Diagnosis not present

## 2014-10-09 DIAGNOSIS — I10 Essential (primary) hypertension: Secondary | ICD-10-CM | POA: Diagnosis not present

## 2014-11-23 ENCOUNTER — Ambulatory Visit: Payer: Self-pay | Admitting: Emergency Medicine

## 2014-11-23 DIAGNOSIS — E780 Pure hypercholesterolemia: Secondary | ICD-10-CM | POA: Diagnosis not present

## 2014-11-23 DIAGNOSIS — J441 Chronic obstructive pulmonary disease with (acute) exacerbation: Secondary | ICD-10-CM | POA: Diagnosis not present

## 2014-11-23 DIAGNOSIS — K219 Gastro-esophageal reflux disease without esophagitis: Secondary | ICD-10-CM | POA: Diagnosis not present

## 2014-11-23 DIAGNOSIS — I10 Essential (primary) hypertension: Secondary | ICD-10-CM | POA: Diagnosis not present

## 2015-01-15 DIAGNOSIS — J449 Chronic obstructive pulmonary disease, unspecified: Secondary | ICD-10-CM | POA: Diagnosis not present

## 2015-01-15 DIAGNOSIS — G4700 Insomnia, unspecified: Secondary | ICD-10-CM | POA: Diagnosis not present

## 2015-01-15 DIAGNOSIS — I10 Essential (primary) hypertension: Secondary | ICD-10-CM | POA: Diagnosis not present

## 2015-01-15 DIAGNOSIS — M1990 Unspecified osteoarthritis, unspecified site: Secondary | ICD-10-CM | POA: Diagnosis not present

## 2015-01-15 DIAGNOSIS — Z23 Encounter for immunization: Secondary | ICD-10-CM | POA: Diagnosis not present

## 2015-01-15 DIAGNOSIS — F411 Generalized anxiety disorder: Secondary | ICD-10-CM | POA: Diagnosis not present

## 2015-01-15 DIAGNOSIS — K219 Gastro-esophageal reflux disease without esophagitis: Secondary | ICD-10-CM | POA: Diagnosis not present

## 2015-02-04 DIAGNOSIS — J449 Chronic obstructive pulmonary disease, unspecified: Secondary | ICD-10-CM | POA: Diagnosis not present

## 2015-02-04 DIAGNOSIS — Z0000 Encounter for general adult medical examination without abnormal findings: Secondary | ICD-10-CM | POA: Diagnosis not present

## 2015-02-04 DIAGNOSIS — G4700 Insomnia, unspecified: Secondary | ICD-10-CM | POA: Diagnosis not present

## 2015-02-04 DIAGNOSIS — F411 Generalized anxiety disorder: Secondary | ICD-10-CM | POA: Diagnosis not present

## 2015-02-04 DIAGNOSIS — I10 Essential (primary) hypertension: Secondary | ICD-10-CM | POA: Diagnosis not present

## 2015-02-04 DIAGNOSIS — K219 Gastro-esophageal reflux disease without esophagitis: Secondary | ICD-10-CM | POA: Diagnosis not present

## 2015-02-04 DIAGNOSIS — Z23 Encounter for immunization: Secondary | ICD-10-CM | POA: Diagnosis not present

## 2015-03-17 ENCOUNTER — Ambulatory Visit: Admit: 2015-03-17 | Disposition: A | Discharge status: Home / Self Care | Payer: Self-pay | Admitting: Family Medicine

## 2015-03-17 DIAGNOSIS — K219 Gastro-esophageal reflux disease without esophagitis: Secondary | ICD-10-CM | POA: Diagnosis not present

## 2015-03-17 DIAGNOSIS — F411 Generalized anxiety disorder: Secondary | ICD-10-CM | POA: Diagnosis not present

## 2015-03-17 DIAGNOSIS — Z23 Encounter for immunization: Secondary | ICD-10-CM | POA: Diagnosis not present

## 2015-03-17 DIAGNOSIS — J449 Chronic obstructive pulmonary disease, unspecified: Secondary | ICD-10-CM | POA: Diagnosis not present

## 2015-03-17 DIAGNOSIS — J40 Bronchitis, not specified as acute or chronic: Secondary | ICD-10-CM | POA: Diagnosis not present

## 2015-03-17 DIAGNOSIS — R05 Cough: Secondary | ICD-10-CM | POA: Diagnosis not present

## 2015-03-17 DIAGNOSIS — G4700 Insomnia, unspecified: Secondary | ICD-10-CM | POA: Diagnosis not present

## 2015-03-17 NOTE — Op Note (Signed)
PATIENT NAME:  Crystal White, Crystal White MR#:  [id number] DATE OF BIRTH:  10-09-1941  DATE OF PROCEDURE:  07/24/2012  PREOPERATIVE DIAGNOSES:  1. 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  2. Bilateral lower extremity pain.  3. History of neuropathy.   POSTOPERATIVE DIAGNOSES: 1. 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  2. Bilateral lower extremity pain.  3. History of neuropathy.   PROCEDURES PERFORMED:  1. Abdominal aortogram.  2. Right lower extremity distal runoff, third order catheter placement.   PROCEDURE PERFORMED BY: Katha Cabal, MD   SEDATION: Versed 4 mg plus fentanyl 150 mcg administered IV. Continuous ECG, pulse oximetry, and cardiopulmonary monitoring is performed throughout the entire procedure by the interventional radiology nurse. Total sedation time was approximately one hour.   ACCESS: 5 French sheath, left common femoral artery.   FLUORO TIME: 1.4 minutes.   CONTRAST USED: Isovue 52 mL.   INDICATIONS: Ms. Cranmer is a 74 year old woman who presented to the office for evaluation of increasing rest pain. Physical examination as well as noninvasive studies demonstrated atherosclerotic changes of the right lower extremity which was the more affected of the two limbs. The patient is quite insistent about treating her lower extremities because of the debilitating nature of her symptoms and has consented to angiography with the possibility of intervention. Risks and benefits were reviewed. All questions answered. The patient agrees to proceed.   PROCEDURE: The patient is taken to Special Procedures and placed in the supine position. After adequate sedation is achieved, both groins are prepped and draped in a sterile fashion. Ultrasound is placed in a sterile sleeve. Ultrasound is utilized secondary to lack of appropriate landmarks and to avoid vascular injury. Under direct ultrasound visualization,  common femoral artery on the left is identified. It is echolucent, homogeneous, and pulsatile indicating patency. Image is recorded and under real-time ultrasound guidance micropuncture needle is inserted into the anterior wall of the common femoral artery, microwire followed by micro sheath, J-wire followed by 5 French sheath and 5 French pigtail catheter. Pigtail catheter is then positioned at the level of T12 and AP projection of the abdominal aorta is obtained. Pigtail catheter is repositioned and LAO projection of the pelvis is obtained. Using a Glidewire and pigtail catheter the aortic bifurcation is crossed. Pigtail catheter is then positioned in the proximal SFA and distal runoff is obtained. SFA lesion is visualized in both AP as well as a steep left lateral projection. After review of the images and measurements of the stenotic area, the catheter is removed over a wire, LAO projection of the left groin is obtained, and a Mynx device deployed without difficulty. There are no immediate complications.   INTERPRETATION: The abdominal aorta as well as the bilateral common internal and external iliac arteries all demonstrate diffuse disease but there are no hemodynamically significant stenoses noted.   The right common femoral and profunda femoris is widely patent. Superficial femoral artery is patent at Hunter's canal. There are two focal areas of stenosis. They are fairly short in length by measurement both in an LAO projection as well as in an AP projection. Approximately a 40 to 45% stenosis is present. This represents diameter reduction as calculated by the computer. The remaining mid and distal popliteal is widely patent. Tibia peroneal trunk is patent. Peroneal is small but patent all the way to the ankle. Anterior tibial is the dominant tibial to the foot and is normal in caliber and patent filling the pedal arch and the  digital vessels. Posterior tibial is noted to be a string sign throughout its  proximal half. Distally it is occluded. There is no visualization of the lateral plantar either. Nevertheless, the patient has two out of three tibial vessels with in-line flow to the foot and, therefore, I do not believe treatment of the isolated peroneal occlusive disease is warranted in the face of a nonulcerative situation.   SUMMARY: Mild to moderate atherosclerotic changes of Hunter's canal which should not yield a significant debility and do not warrant stenting or intervention at this time. This is associated with single vessel tibial disease, occlusion of the posterior tibial. Anterior tibial is the dominant runoff to the foot.    Given the patient's circumstances, I do not believe intervention is warranted as I do not believe this would improve her lower extremity symptoms. She is strongly encouraged to continue to exercise as well as to follow these areas of atherosclerotic changes with ultrasounds on a periodic basis.   ____________________________ Katha Cabal, MD ggs:drc D: 07/25/2012 14:24:19 ET T: 07/25/2012 15:27:53 ET JOB#: [id number]  cc: Katha Cabal, MD, <Dictator> Richard L. Rosanna Randy, [place] MD ELECTRONICALLY SIGNED 08/03/2012 16:24

## 2015-03-25 DIAGNOSIS — R05 Cough: Secondary | ICD-10-CM | POA: Diagnosis not present

## 2015-03-25 DIAGNOSIS — G4700 Insomnia, unspecified: Secondary | ICD-10-CM | POA: Diagnosis not present

## 2015-03-25 DIAGNOSIS — J40 Bronchitis, not specified as acute or chronic: Secondary | ICD-10-CM | POA: Diagnosis not present

## 2015-03-25 DIAGNOSIS — Z23 Encounter for immunization: Secondary | ICD-10-CM | POA: Diagnosis not present

## 2015-03-25 DIAGNOSIS — F411 Generalized anxiety disorder: Secondary | ICD-10-CM | POA: Diagnosis not present

## 2015-03-25 DIAGNOSIS — Z720 Tobacco use: Secondary | ICD-10-CM | POA: Diagnosis not present

## 2015-03-25 DIAGNOSIS — J449 Chronic obstructive pulmonary disease, unspecified: Secondary | ICD-10-CM | POA: Diagnosis not present

## 2015-04-06 DIAGNOSIS — Z23 Encounter for immunization: Secondary | ICD-10-CM | POA: Diagnosis not present

## 2015-04-06 DIAGNOSIS — Z720 Tobacco use: Secondary | ICD-10-CM | POA: Diagnosis not present

## 2015-04-06 DIAGNOSIS — F411 Generalized anxiety disorder: Secondary | ICD-10-CM | POA: Diagnosis not present

## 2015-04-06 DIAGNOSIS — J449 Chronic obstructive pulmonary disease, unspecified: Secondary | ICD-10-CM | POA: Diagnosis not present

## 2015-04-06 DIAGNOSIS — J441 Chronic obstructive pulmonary disease with (acute) exacerbation: Secondary | ICD-10-CM | POA: Diagnosis not present

## 2015-04-06 DIAGNOSIS — K140 Glossitis: Secondary | ICD-10-CM | POA: Diagnosis not present

## 2015-04-06 DIAGNOSIS — G4700 Insomnia, unspecified: Secondary | ICD-10-CM | POA: Diagnosis not present

## 2015-04-09 DIAGNOSIS — I10 Essential (primary) hypertension: Secondary | ICD-10-CM | POA: Diagnosis not present

## 2015-04-09 DIAGNOSIS — Z720 Tobacco use: Secondary | ICD-10-CM | POA: Diagnosis not present

## 2015-04-09 DIAGNOSIS — K2270 Barrett's esophagus without dysplasia: Secondary | ICD-10-CM | POA: Diagnosis not present

## 2015-04-09 DIAGNOSIS — I442 Atrioventricular block, complete: Secondary | ICD-10-CM | POA: Diagnosis not present

## 2015-04-09 DIAGNOSIS — Z86010 Personal history of colonic polyps: Secondary | ICD-10-CM | POA: Diagnosis not present

## 2015-04-09 DIAGNOSIS — R1013 Epigastric pain: Secondary | ICD-10-CM | POA: Diagnosis not present

## 2015-04-09 DIAGNOSIS — E780 Pure hypercholesterolemia: Secondary | ICD-10-CM | POA: Diagnosis not present

## 2015-04-09 DIAGNOSIS — Z950 Presence of cardiac pacemaker: Secondary | ICD-10-CM | POA: Diagnosis not present

## 2015-04-13 ENCOUNTER — Encounter: Payer: Self-pay | Admitting: *Deleted

## 2015-04-14 ENCOUNTER — Ambulatory Visit: Payer: Medicare Other | Admitting: Anesthesiology

## 2015-04-14 ENCOUNTER — Ambulatory Visit
Admission: RE | Admit: 2015-04-14 | Discharge: 2015-04-14 | Disposition: A | Discharge status: Home / Self Care | Payer: Medicare Other | Source: Ambulatory Visit | Attending: Gastroenterology | Admitting: Gastroenterology

## 2015-04-14 ENCOUNTER — Encounter
Admission: RE | Disposition: A | Discharge status: Home / Self Care | Payer: Self-pay | Source: Ambulatory Visit | Attending: Gastroenterology

## 2015-04-14 DIAGNOSIS — R5381 Other malaise: Secondary | ICD-10-CM | POA: Insufficient documentation

## 2015-04-14 DIAGNOSIS — B3781 Candidal esophagitis: Secondary | ICD-10-CM | POA: Diagnosis not present

## 2015-04-14 DIAGNOSIS — K644 Residual hemorrhoidal skin tags: Secondary | ICD-10-CM | POA: Diagnosis not present

## 2015-04-14 DIAGNOSIS — E785 Hyperlipidemia, unspecified: Secondary | ICD-10-CM | POA: Insufficient documentation

## 2015-04-14 DIAGNOSIS — F329 Major depressive disorder, single episode, unspecified: Secondary | ICD-10-CM | POA: Insufficient documentation

## 2015-04-14 DIAGNOSIS — G3184 Mild cognitive impairment, so stated: Secondary | ICD-10-CM | POA: Insufficient documentation

## 2015-04-14 DIAGNOSIS — L538 Other specified erythematous conditions: Secondary | ICD-10-CM | POA: Insufficient documentation

## 2015-04-14 DIAGNOSIS — Z888 Allergy status to other drugs, medicaments and biological substances status: Secondary | ICD-10-CM | POA: Insufficient documentation

## 2015-04-14 DIAGNOSIS — G4700 Insomnia, unspecified: Secondary | ICD-10-CM | POA: Insufficient documentation

## 2015-04-14 DIAGNOSIS — K2270 Barrett's esophagus without dysplasia: Secondary | ICD-10-CM | POA: Insufficient documentation

## 2015-04-14 DIAGNOSIS — R490 Dysphonia: Secondary | ICD-10-CM | POA: Insufficient documentation

## 2015-04-14 DIAGNOSIS — R5383 Other fatigue: Secondary | ICD-10-CM

## 2015-04-14 DIAGNOSIS — K3189 Other diseases of stomach and duodenum: Secondary | ICD-10-CM | POA: Diagnosis not present

## 2015-04-14 DIAGNOSIS — K319 Disease of stomach and duodenum, unspecified: Secondary | ICD-10-CM | POA: Insufficient documentation

## 2015-04-14 DIAGNOSIS — F32A Depression, unspecified: Secondary | ICD-10-CM | POA: Insufficient documentation

## 2015-04-14 DIAGNOSIS — B370 Candidal stomatitis: Secondary | ICD-10-CM | POA: Insufficient documentation

## 2015-04-14 DIAGNOSIS — R634 Abnormal weight loss: Secondary | ICD-10-CM | POA: Insufficient documentation

## 2015-04-14 DIAGNOSIS — Z86010 Personal history of colonic polyps: Secondary | ICD-10-CM | POA: Insufficient documentation

## 2015-04-14 DIAGNOSIS — Z7951 Long term (current) use of inhaled steroids: Secondary | ICD-10-CM | POA: Diagnosis not present

## 2015-04-14 DIAGNOSIS — J449 Chronic obstructive pulmonary disease, unspecified: Secondary | ICD-10-CM | POA: Insufficient documentation

## 2015-04-14 DIAGNOSIS — Z79899 Other long term (current) drug therapy: Secondary | ICD-10-CM | POA: Insufficient documentation

## 2015-04-14 DIAGNOSIS — R252 Cramp and spasm: Secondary | ICD-10-CM | POA: Insufficient documentation

## 2015-04-14 DIAGNOSIS — D120 Benign neoplasm of cecum: Secondary | ICD-10-CM | POA: Diagnosis not present

## 2015-04-14 DIAGNOSIS — Z7952 Long term (current) use of systemic steroids: Secondary | ICD-10-CM | POA: Diagnosis not present

## 2015-04-14 DIAGNOSIS — S93409A Sprain of unspecified ligament of unspecified ankle, initial encounter: Secondary | ICD-10-CM | POA: Insufficient documentation

## 2015-04-14 DIAGNOSIS — K2930 Chronic superficial gastritis without bleeding: Secondary | ICD-10-CM | POA: Diagnosis not present

## 2015-04-14 DIAGNOSIS — F419 Anxiety disorder, unspecified: Secondary | ICD-10-CM | POA: Insufficient documentation

## 2015-04-14 DIAGNOSIS — I10 Essential (primary) hypertension: Secondary | ICD-10-CM | POA: Insufficient documentation

## 2015-04-14 DIAGNOSIS — Z91041 Radiographic dye allergy status: Secondary | ICD-10-CM | POA: Diagnosis not present

## 2015-04-14 DIAGNOSIS — M791 Myalgia, unspecified site: Secondary | ICD-10-CM | POA: Insufficient documentation

## 2015-04-14 DIAGNOSIS — Z950 Presence of cardiac pacemaker: Secondary | ICD-10-CM | POA: Insufficient documentation

## 2015-04-14 DIAGNOSIS — K224 Dyskinesia of esophagus: Secondary | ICD-10-CM | POA: Diagnosis not present

## 2015-04-14 DIAGNOSIS — K2960 Other gastritis without bleeding: Secondary | ICD-10-CM | POA: Diagnosis not present

## 2015-04-14 DIAGNOSIS — K210 Gastro-esophageal reflux disease with esophagitis: Secondary | ICD-10-CM | POA: Insufficient documentation

## 2015-04-14 DIAGNOSIS — D124 Benign neoplasm of descending colon: Secondary | ICD-10-CM | POA: Insufficient documentation

## 2015-04-14 DIAGNOSIS — R059 Cough, unspecified: Secondary | ICD-10-CM | POA: Insufficient documentation

## 2015-04-14 DIAGNOSIS — K5730 Diverticulosis of large intestine without perforation or abscess without bleeding: Secondary | ICD-10-CM | POA: Diagnosis not present

## 2015-04-14 DIAGNOSIS — K449 Diaphragmatic hernia without obstruction or gangrene: Secondary | ICD-10-CM | POA: Insufficient documentation

## 2015-04-14 DIAGNOSIS — E7800 Pure hypercholesterolemia, unspecified: Secondary | ICD-10-CM | POA: Insufficient documentation

## 2015-04-14 DIAGNOSIS — G629 Polyneuropathy, unspecified: Secondary | ICD-10-CM | POA: Insufficient documentation

## 2015-04-14 DIAGNOSIS — K2950 Unspecified chronic gastritis without bleeding: Secondary | ICD-10-CM | POA: Diagnosis not present

## 2015-04-14 DIAGNOSIS — Z915 Personal history of self-harm: Secondary | ICD-10-CM | POA: Insufficient documentation

## 2015-04-14 DIAGNOSIS — R109 Unspecified abdominal pain: Secondary | ICD-10-CM | POA: Insufficient documentation

## 2015-04-14 DIAGNOSIS — M719 Bursopathy, unspecified: Secondary | ICD-10-CM | POA: Insufficient documentation

## 2015-04-14 DIAGNOSIS — J441 Chronic obstructive pulmonary disease with (acute) exacerbation: Secondary | ICD-10-CM | POA: Insufficient documentation

## 2015-04-14 DIAGNOSIS — G4733 Obstructive sleep apnea (adult) (pediatric): Secondary | ICD-10-CM | POA: Diagnosis not present

## 2015-04-14 DIAGNOSIS — R05 Cough: Secondary | ICD-10-CM | POA: Insufficient documentation

## 2015-04-14 DIAGNOSIS — E739 Lactose intolerance, unspecified: Secondary | ICD-10-CM | POA: Insufficient documentation

## 2015-04-14 DIAGNOSIS — L0390 Cellulitis, unspecified: Secondary | ICD-10-CM | POA: Insufficient documentation

## 2015-04-14 DIAGNOSIS — E669 Obesity, unspecified: Secondary | ICD-10-CM | POA: Insufficient documentation

## 2015-04-14 DIAGNOSIS — R42 Dizziness and giddiness: Secondary | ICD-10-CM | POA: Insufficient documentation

## 2015-04-14 DIAGNOSIS — K635 Polyp of colon: Secondary | ICD-10-CM | POA: Diagnosis not present

## 2015-04-14 DIAGNOSIS — I739 Peripheral vascular disease, unspecified: Secondary | ICD-10-CM | POA: Insufficient documentation

## 2015-04-14 DIAGNOSIS — Z9151 Personal history of suicidal behavior: Secondary | ICD-10-CM | POA: Insufficient documentation

## 2015-04-14 HISTORY — PX: COLONOSCOPY WITH PROPOFOL: SHX5780

## 2015-04-14 HISTORY — PX: ESOPHAGOGASTRODUODENOSCOPY: SHX5428

## 2015-04-14 HISTORY — DX: Gastro-esophageal reflux disease without esophagitis: K21.9

## 2015-04-14 HISTORY — DX: Gastric ulcer, unspecified as acute or chronic, without hemorrhage or perforation: K25.9

## 2015-04-14 HISTORY — DX: Presence of cardiac pacemaker: Z95.0

## 2015-04-14 SURGERY — COLONOSCOPY WITH PROPOFOL
Anesthesia: General

## 2015-04-14 MED ORDER — PROPOFOL 10 MG/ML IV BOLUS
INTRAVENOUS | Status: DC | PRN
  Administered 2015-04-14: 50 mg via INTRAVENOUS

## 2015-04-14 MED ORDER — SODIUM CHLORIDE 0.9 % IV SOLN
INTRAVENOUS | Status: DC
  Administered 2015-04-14 (×2): via INTRAVENOUS

## 2015-04-14 MED ORDER — SODIUM CHLORIDE 0.9 % IV SOLN: INTRAVENOUS | Status: DC

## 2015-04-14 MED ORDER — PROPOFOL INFUSION 10 MG/ML OPTIME
INTRAVENOUS | Status: DC | PRN
  Administered 2015-04-14: 140 ug/kg/min via INTRAVENOUS

## 2015-04-14 MED ORDER — PHENYLEPHRINE HCL 10 MG/ML IJ SOLN
INTRAMUSCULAR | Status: DC | PRN
  Administered 2015-04-14 (×4): 100 ug via INTRAVENOUS

## 2015-04-14 MED ORDER — MIDAZOLAM HCL 2 MG/2ML IJ SOLN
INTRAMUSCULAR | Status: DC | PRN
  Administered 2015-04-14: 1 mg via INTRAVENOUS

## 2015-04-14 MED ORDER — FENTANYL CITRATE (PF) 100 MCG/2ML IJ SOLN
INTRAMUSCULAR | Status: DC | PRN
  Administered 2015-04-14: 50 ug via INTRAVENOUS

## 2015-04-14 NOTE — Anesthesia Procedure Notes (Signed)
Date/Time: 04/14/2015 1:41 PM Performed by: Nelda Marseille Pre-anesthesia Checklist: Patient identified, Emergency Drugs available, Suction available, Patient being monitored and Timeout performed Oxygen Delivery Method: Nasal cannula

## 2015-04-14 NOTE — H&P (Signed)
Outpatient short stay form Pre-procedure 04/14/2015 1:46 PM Lollie Sails MD  Primary Physician: Dr. [name]  Reason for visit:  Follow-up Barrett's esophagus and personal history of adenomatous colon polyps  History of present illness:  Is Crystal White is a 74 year old female who presents today for EGD and colonoscopy in regards to above. She denies the use of any anticoagulants or aspirin products. She is currently completing an  outpatient course of antibiotics for some bronchitis. He is doing well from that.  Current facility-administered medications:  .  0.9 %  sodium chloride infusion, , Intravenous, Continuous, Lollie Sails, MD, Last Rate: 20 mL/hr at 04/14/15 1336 .  0.9 %  sodium chloride infusion, , Intravenous, Continuous, Lollie Sails, MD  Prescriptions prior to admission  Medication Sig Dispense Refill Last Dose  . acetaminophen (TYLENOL) 500 MG tablet Take 2 tablets by mouth daily as needed.     Marland Kitchen albuterol (PROVENTIL HFA) 108 (90 BASE) MCG/ACT inhaler Inhale 2 puffs into the lungs every 6 (six) hours as needed.   Taking  . ALPRAZolam (XANAX) 0.25 MG tablet Take 0.25 mg by mouth as needed.    Taking  . budesonide-formoterol (SYMBICORT) 160-4.5 MCG/ACT inhaler Inhale 1 puff into the lungs 2 (two) times daily.     . calcium carbonate 200 MG capsule Take 500 mg by mouth daily.     . citalopram (CELEXA) 10 MG tablet Take 10 mg by mouth daily.     . fluticasone (FLONASE) 50 MCG/ACT nasal spray Place 2 sprays into both nostrils daily.     Marland Kitchen gabapentin (NEURONTIN) 100 MG capsule Take 200 mg by mouth 3 (three) times daily.    Taking  . hydrochlorothiazide (HYDRODIURIL) 25 MG tablet Take 25 mg by mouth daily.   04/14/2015 at 6:30amtime  . levofloxacin (LEVAQUIN) 500 MG tablet Take 1 tablet by mouth daily. For 10 days     . losartan (COZAAR) 50 MG tablet Take 1 tablet by mouth daily.     . magnesium oxide (MAG-OX) 400 MG tablet Take 400 mg by mouth daily.     .  mirtazapine (REMERON) 30 MG tablet Take 30 mg by mouth at bedtime.   Taking  . Multiple Vitamin (MULTIVITAMIN) capsule Take 1 capsule by mouth daily.   Taking  . pantoprazole (PROTONIX) 40 MG tablet Take 40 mg by mouth 2 (two) times daily.     . polyethylene glycol powder (GLYCOLAX/MIRALAX) powder Take 1 Container by mouth once.     . pravastatin (PRAVACHOL) 20 MG tablet Take 20 mg by mouth daily.     . predniSONE (DELTASONE) 10 MG tablet Take 10-60 mg by mouth as directed. 6 Tablets x 2d, 5 x 2d, 4 x 2d, 3 x 2d, 2 x 2d, 1 x 2d for a total of 12 days     . traZODone (DESYREL) 150 MG tablet Take 150 mg by mouth at bedtime.   Taking  . zolpidem (AMBIEN) 5 MG tablet Take 5 mg by mouth at bedtime as needed for sleep.     . budesonide-formoterol (SYMBICORT) 80-4.5 MCG/ACT inhaler Inhale 2 puffs into the lungs 2 (two) times daily. 1 Inhaler 5   . hyoscyamine (LEVBID) 0.375 MG 12 hr tablet Take 1 tablet by mouth daily.     . meclizine (ANTIVERT) 25 MG tablet Take 1 tablet by mouth daily as needed. Dizzyness   Completed Course at Unknown time  . Suvorexant 10 MG TABS Take 1 tablet by mouth at bedtime as  needed (sleep).   Completed Course at Unknown time  . varenicline (CHANTIX) 0.5 MG tablet Take 1 tablet (0.5 mg total) by mouth 2 (two) times daily. 70 tablet 2      Allergies  Allergen Reactions  . Contrast Media [Iodinated Diagnostic Agents]   . Lovastatin     leg cramps  . Statins Other (See Comments)     Past Medical History  Diagnosis Date  . Hypertension   . COPD (chronic obstructive pulmonary disease)   . Hyperlipidemia   . OSA (obstructive sleep apnea)   . Presence of permanent cardiac pacemaker   . GERD (gastroesophageal reflux disease)   . Stomach ulcer     Review of systems:      Physical Exam    Heart and lungs: Regular rate and rhythm without rub or gallop, bilaterally clear to auscultation.    HEENT: Normocephalic atraumatic eyes are anicteric    Other:      Pertinant exam for procedure: Soft nontender nondistended bowel sounds positive normoactive    Planned proceedures: EGD and colonoscopy. I have discussed the risks benefits and complications of procedures to include not limited to bleeding, infection, perforation and the risk of sedation and the patient wishes to proceed.    Lollie Sails, MD Gastroenterology 04/14/2015  1:46 PM     Outpatient short stay form Pre-procedure 04/14/2015 1:46 PM Lollie Sails MD  Primary Physician:   Reason for visit:    History of present illness:      Current facility-administered medications:  .  0.9 %  sodium chloride infusion, , Intravenous, Continuous, Lollie Sails, MD, Last Rate: 20 mL/hr at 04/14/15 1336 .  0.9 %  sodium chloride infusion, , Intravenous, Continuous, Lollie Sails, MD  Prescriptions prior to admission  Medication Sig Dispense Refill Last Dose  . acetaminophen (TYLENOL) 500 MG tablet Take 2 tablets by mouth daily as needed.     Marland Kitchen albuterol (PROVENTIL HFA) 108 (90 BASE) MCG/ACT inhaler Inhale 2 puffs into the lungs every 6 (six) hours as needed.   Taking  . ALPRAZolam (XANAX) 0.25 MG tablet Take 0.25 mg by mouth as needed.    Taking  . budesonide-formoterol (SYMBICORT) 160-4.5 MCG/ACT inhaler Inhale 1 puff into the lungs 2 (two) times daily.     . calcium carbonate 200 MG capsule Take 500 mg by mouth daily.     . citalopram (CELEXA) 10 MG tablet Take 10 mg by mouth daily.     . fluticasone (FLONASE) 50 MCG/ACT nasal spray Place 2 sprays into both nostrils daily.     Marland Kitchen gabapentin (NEURONTIN) 100 MG capsule Take 200 mg by mouth 3 (three) times daily.    Taking  . hydrochlorothiazide (HYDRODIURIL) 25 MG tablet Take 25 mg by mouth daily.   04/14/2015 at 6:30amtime  . levofloxacin (LEVAQUIN) 500 MG tablet Take 1 tablet by mouth daily. For 10 days     . losartan (COZAAR) 50 MG tablet Take 1 tablet by mouth daily.     . magnesium oxide (MAG-OX) 400 MG tablet Take  400 mg by mouth daily.     . mirtazapine (REMERON) 30 MG tablet Take 30 mg by mouth at bedtime.   Taking  . Multiple Vitamin (MULTIVITAMIN) capsule Take 1 capsule by mouth daily.   Taking  . pantoprazole (PROTONIX) 40 MG tablet Take 40 mg by mouth 2 (two) times daily.     . polyethylene glycol powder (GLYCOLAX/MIRALAX) powder Take 1 Container by mouth once.     Marland Kitchen  pravastatin (PRAVACHOL) 20 MG tablet Take 20 mg by mouth daily.     . predniSONE (DELTASONE) 10 MG tablet Take 10-60 mg by mouth as directed. 6 Tablets x 2d, 5 x 2d, 4 x 2d, 3 x 2d, 2 x 2d, 1 x 2d for a total of 12 days     . traZODone (DESYREL) 150 MG tablet Take 150 mg by mouth at bedtime.   Taking  . zolpidem (AMBIEN) 5 MG tablet Take 5 mg by mouth at bedtime as needed for sleep.     . budesonide-formoterol (SYMBICORT) 80-4.5 MCG/ACT inhaler Inhale 2 puffs into the lungs 2 (two) times daily. 1 Inhaler 5   . hyoscyamine (LEVBID) 0.375 MG 12 hr tablet Take 1 tablet by mouth daily.     . meclizine (ANTIVERT) 25 MG tablet Take 1 tablet by mouth daily as needed. Dizzyness   Completed Course at Unknown time  . Suvorexant 10 MG TABS Take 1 tablet by mouth at bedtime as needed (sleep).   Completed Course at Unknown time  . varenicline (CHANTIX) 0.5 MG tablet Take 1 tablet (0.5 mg total) by mouth 2 (two) times daily. 70 tablet 2      Allergies  Allergen Reactions  . Contrast Media [Iodinated Diagnostic Agents]   . Lovastatin     leg cramps  . Statins Other (See Comments)     Past Medical History  Diagnosis Date  . Hypertension   . COPD (chronic obstructive pulmonary disease)   . Hyperlipidemia   . OSA (obstructive sleep apnea)   . Presence of permanent cardiac pacemaker   . GERD (gastroesophageal reflux disease)   . Stomach ulcer     Review of systems:      Physical Exam    Heart and lungs:     HEENT:     Other:     Pertinant exam for procedure:     Planned proceedures:     Lollie Sails,  MD Gastroenterology 04/14/2015  1:46 PM

## 2015-04-14 NOTE — Op Note (Addendum)
[facility] Gastroenterology Patient Name: Crystal White Procedure Date: 04/14/2015 2:21 PM MRN: [id number] Account #: [id number] Date of Birth: 1941/04/20 Admit Type: Outpatient Age: 74 Room: [facility] ENDO ROOM 3 Gender: Female Note Status: Supervisor Override Procedure:         Upper GI endoscopy Indications:       Follow-up of Barrett's esophagus Providers:         Lollie Sails, MD Medicines:         Monitored Anesthesia Care Complications:     No immediate complications. Procedure:         Pre-Anesthesia Assessment:                    - ASA Grade Assessment: III - A patient with severe                     systemic disease.                    After obtaining informed consent, the endoscope was passed                     under direct vision. Throughout the procedure, the                     patient's blood pressure, pulse, and oxygen saturations                     were monitored continuously. The Olympus GIF-160 endoscope                     (S#. [id number]) was introduced through the mouth, and                     advanced to the third part of duodenum. The upper GI                     endoscopy was accomplished without difficulty. The patient                     tolerated the procedure well. Findings:      Patchy candidiasis was found in the middle third of the esophagus and in       the lower third of the esophagus. Cells for cytology were obtained by       brushing.      Abnormal motility was noted in the middle third of the esophagus and in       the lower third of the esophagus. The cricopharyngeus was normal. There       is spasticity of the esophageal body. The distal esophagus/lower       esophageal sphincter is open. Secondary and tertiary peristaltic waves       are noted.      LA Grade B (one or more mucosal breaks greater than 5 mm, not extending       between the tops of two mucosal folds) esophagitis with no bleeding was       found.      The  Z-line was variable.      There were esophageal mucosal changes secondary to established       short-segment Barrett's disease present at the gastroesophageal       junction. The maximum longitudinal extent of these mucosal changes was 1       cm in length. Mucosa was biopsied  with a cold forceps for histology in 4       quadrants at 39 cm from the incisors.      A few dispersed, small non-bleeding erosions were found in the       prepyloric region of the stomach. There were no stigmata of recent       bleeding. Biopsies were taken with a cold forceps for Helicobacter       pylori testing. Biopsies were taken with a cold forceps for histology.      A hiatus hernia was present.      The exam of the stomach was otherwise normal.      The examined duodenum was normal.      A single small submucosal papule (nodule) with no bleeding and no       stigmata of recent bleeding was found on the greater curvature of the       gastric antrum. Biopsies were taken with a cold forceps for histology.       The nodule is mobile eto the forcep. Impression:        - Monilial esophagitis. Cells for cytology obtained.                    - Esophageal motility disorder.                    - LA Grade B erosive esophagitis.                    - Z-line variable.                    - Esophageal mucosal changes secondary to established                     short-segment Barrett's disease. Biopsied.                    - Non-bleeding erosive gastropathy. Biopsied.                    - Hiatus hernia.                    - Normal examined duodenum.                    - A single submucosal papule (nodule) found in the                     stomach. Biopsied. Recommendation:    - Perform a colonoscopy today.                    - Use Protonix (pantoprazole) 40 mg PO BID daily. Procedure Code(s): --- Professional ---                    [phone], Esophagogastroduodenoscopy, flexible, transoral;                     with biopsy,  single or multiple Diagnosis Code(s): --- Professional ---                    112.84, Candidal esophagitis                    530.5, Dyskinesia of esophagus                    530.19, Other esophagitis  530.89, Other specified disorders of esophagus                    530.85, Barrett's esophagus                    537.9, Unspecified disorder of stomach and duodenum                    553.3, Diaphragmatic hernia without mention of obstruction                     or gangrene CPT copyright 2014 American [employer] Association. All rights reserved. The codes documented in this report are preliminary and upon coder review may  be revised to meet current compliance requirements. Lollie Sails, MD 04/14/2015 2:29:17 PM This report has been signed electronically. Number of Addenda: 0 Note Initiated On: 04/14/2015 2:21 PM      [facility]

## 2015-04-14 NOTE — Transfer of Care (Signed)
Immediate Anesthesia Transfer of Care Note  Patient: Crystal White  Procedure(s) Performed: Procedure(s): COLONOSCOPY WITH PROPOFOL (N/A) ESOPHAGOGASTRODUODENOSCOPY (EGD) (N/A)  Patient Location: PACU and Endoscopy Unit  Anesthesia Type:General  Level of Consciousness: awake, alert  and oriented  Airway & Oxygen Therapy: Patient Spontanous Breathing and Patient connected to nasal cannula oxygen  Post-op Assessment: Report given to RN and Post -op Vital signs reviewed and stable  Post vital signs: Reviewed and stable  Last Vitals:  Filed Vitals:   04/14/15 1312  BP: 146/68  Pulse: 91  Temp: 34.7 C  Resp: 12    Complications: No apparent anesthesia complications

## 2015-04-14 NOTE — Anesthesia Postprocedure Evaluation (Signed)
  Anesthesia Post-op Note  Patient: Crystal White  Procedure(s) Performed: Procedure(s): COLONOSCOPY WITH PROPOFOL (N/A) ESOPHAGOGASTRODUODENOSCOPY (EGD) (N/A)  Anesthesia type:General  Patient location: PACU  Post pain: Pain level controlled  Post assessment: Post-op Vital signs reviewed, Patient's Cardiovascular Status Stable, Respiratory Function Stable, Patent Airway and No signs of Nausea or vomiting  Post vital signs: Reviewed and stable  Last Vitals:  Filed Vitals:   04/14/15 1540  BP: 150/71  Pulse: 82  Temp:   Resp: 17    Level of consciousness: awake, alert  and patient cooperative  Complications: No apparent anesthesia complications

## 2015-04-14 NOTE — Op Note (Signed)
[facility] Gastroenterology Patient Name: Crystal White Procedure Date: 04/14/2015 1:43 PM MRN: [id number] Account #: [id number] Date of Birth: 02/08/1941 Admit Type: Outpatient Age: 74 Room: [facility] ENDO ROOM 3 Gender: Female Note Status: Finalized Procedure:         Colonoscopy Indications:       Personal history of colonic polyps Providers:         Lollie Sails, MD Referring MD:      No Local Md, MD (Referring MD) Medicines:         Monitored Anesthesia Care Complications:     No immediate complications. Procedure:         Pre-Anesthesia Assessment:                    - ASA Grade Assessment: III - A patient with severe                     systemic disease.                    After obtaining informed consent, the colonoscope was                     passed under direct vision. Throughout the procedure, the                     patient's blood pressure, pulse, and oxygen saturations                     were monitored continuously. The Colonoscope was                     introduced through the anus and advanced to the the cecum,                     identified by appendiceal orifice and ileocecal valve. The                     colonoscopy was performed with moderate difficulty due to                     a tortuous colon. Successful completion of the procedure                     was aided by changing the patient to a supine position and                     changing the patient to a prone position. The patient                     tolerated the procedure well. Findings:      Multiple medium-mouthed diverticula were found in the entire colon.      A 3 mm polyp was found in the descending colon. The polyp was flat. The       polyp was removed with a cold biopsy forceps. Resection and retrieval       were complete.      A medium polyp was found at the appendiceal orifice. The polyp was       sessile. Biopsy were taken with a cold forceps for histology from the   outer edge of the lesion. Impression:        - Diverticulosis in the entire examined colon.                    -  One 3 mm polyp in the descending colon. Resected and                     retrieved.                    - One medium polyp at the appendiceal orifice. Biopsied. Recommendation:    - Discharge patient to home.                    - Await pathology results. May need referral to general                     surgeon if path on appendiceal polyp is                     adenomatous/abnormal. Procedure Code(s): --- Professional ---                    [id number], Colonoscopy, flexible; with biopsy, single or                     multiple Diagnosis Code(s): --- Professional ---                    211.3, Benign neoplasm of colon                    V12.72, Personal history of colonic polyps                    562.10, Diverticulosis of colon (without mention of                     hemorrhage) CPT copyright 2014 American Medical Association. All rights reserved. The codes documented in this report are preliminary and upon coder review may  be revised to meet current compliance requirements. Lollie Sails, MD 04/14/2015 3:13:35 PM This report has been signed electronically. Number of Addenda: 0 Note Initiated On: 04/14/2015 1:43 PM Scope Withdrawal Time: 0 hours 18 minutes 14 seconds  Total Procedure Duration: 0 hours 26 minutes 38 seconds       [facility]

## 2015-04-14 NOTE — Anesthesia Preprocedure Evaluation (Addendum)
Anesthesia Evaluation  Patient identified by MRN, date of birth, ID band Patient awake    Reviewed: Allergy & Precautions, NPO status , Patient's Chart, lab work & pertinent test results  Airway Mallampati: II  TM Distance: >3 FB Neck ROM: Full    Dental   Pulmonary sleep apnea (using most of the time) and Continuous Positive Airway Pressure Ventilation , COPD COPD inhaler, Recent URI  (Pt recently diagnosised with bronchitis. 2 more days of antibiotics left. Symptoms resolved. Pt informed of increased risk., willing to proceed.), Current Smoker (1ppd),          Cardiovascular hypertension, Pt. on medications + pacemaker (for symptomatic bradycardia)     Neuro/Psych Anxiety Depression    GI/Hepatic PUD, GERD-  Medicated and Controlled,  Endo/Other    Renal/GU      Musculoskeletal   Abdominal   Peds  Hematology   Anesthesia Other Findings   Reproductive/Obstetrics                            Anesthesia Physical Anesthesia Plan  ASA: III  Anesthesia Plan: General   Post-op Pain Management:    Induction: Intravenous  Airway Management Planned: Nasal Cannula  Additional Equipment:   Intra-op Plan:   Post-operative Plan:   Informed Consent: I have reviewed the patients History and Physical, chart, labs and discussed the procedure including the risks, benefits and alternatives for the proposed anesthesia with the patient or authorized representative who has indicated his/her understanding and acceptance.     Plan Discussed with:   Anesthesia Plan Comments:         Anesthesia Quick Evaluation

## 2015-04-15 LAB — KOH PREP: KOH Prep: NONE SEEN

## 2015-04-16 LAB — SURGICAL PATHOLOGY

## 2015-04-21 ENCOUNTER — Encounter: Payer: Self-pay | Admitting: Gastroenterology

## 2015-04-24 ENCOUNTER — Ambulatory Visit: Payer: Medicare Other

## 2015-04-24 ENCOUNTER — Ambulatory Visit (INDEPENDENT_AMBULATORY_CARE_PROVIDER_SITE_OTHER)
Admission: EM | Admit: 2015-04-24 | Discharge: 2015-04-24 | Disposition: A | Discharge status: Home / Self Care | Payer: Medicare Other | Source: Home / Self Care | Attending: Family Medicine | Admitting: Family Medicine

## 2015-04-24 ENCOUNTER — Encounter: Payer: Self-pay | Admitting: Medical Oncology

## 2015-04-24 ENCOUNTER — Emergency Department: Payer: Medicare Other

## 2015-04-24 ENCOUNTER — Other Ambulatory Visit: Payer: Self-pay

## 2015-04-24 ENCOUNTER — Emergency Department
Admission: EM | Admit: 2015-04-24 | Discharge: 2015-04-25 | Disposition: A | Discharge status: Home / Self Care | Payer: Medicare Other | Attending: Emergency Medicine | Admitting: Emergency Medicine

## 2015-04-24 ENCOUNTER — Encounter: Payer: Self-pay | Admitting: Emergency Medicine

## 2015-04-24 DIAGNOSIS — R0789 Other chest pain: Secondary | ICD-10-CM | POA: Insufficient documentation

## 2015-04-24 DIAGNOSIS — K219 Gastro-esophageal reflux disease without esophagitis: Secondary | ICD-10-CM

## 2015-04-24 DIAGNOSIS — J449 Chronic obstructive pulmonary disease, unspecified: Secondary | ICD-10-CM

## 2015-04-24 DIAGNOSIS — N289 Disorder of kidney and ureter, unspecified: Secondary | ICD-10-CM

## 2015-04-24 DIAGNOSIS — I10 Essential (primary) hypertension: Secondary | ICD-10-CM

## 2015-04-24 DIAGNOSIS — R042 Hemoptysis: Secondary | ICD-10-CM

## 2015-04-24 DIAGNOSIS — R0600 Dyspnea, unspecified: Secondary | ICD-10-CM

## 2015-04-24 DIAGNOSIS — E785 Hyperlipidemia, unspecified: Secondary | ICD-10-CM

## 2015-04-24 DIAGNOSIS — R05 Cough: Secondary | ICD-10-CM | POA: Insufficient documentation

## 2015-04-24 DIAGNOSIS — R0602 Shortness of breath: Secondary | ICD-10-CM | POA: Diagnosis not present

## 2015-04-24 DIAGNOSIS — G4733 Obstructive sleep apnea (adult) (pediatric): Secondary | ICD-10-CM

## 2015-04-24 DIAGNOSIS — F17210 Nicotine dependence, cigarettes, uncomplicated: Secondary | ICD-10-CM | POA: Insufficient documentation

## 2015-04-24 DIAGNOSIS — R079 Chest pain, unspecified: Secondary | ICD-10-CM | POA: Diagnosis not present

## 2015-04-24 DIAGNOSIS — Z79899 Other long term (current) drug therapy: Secondary | ICD-10-CM | POA: Diagnosis not present

## 2015-04-24 DIAGNOSIS — Z720 Tobacco use: Secondary | ICD-10-CM | POA: Diagnosis not present

## 2015-04-24 DIAGNOSIS — J441 Chronic obstructive pulmonary disease with (acute) exacerbation: Secondary | ICD-10-CM | POA: Insufficient documentation

## 2015-04-24 LAB — BASIC METABOLIC PANEL
ANION GAP: 11 (ref 5–15)
BUN: 17 mg/dL (ref 6–20)
CALCIUM: 9.1 mg/dL (ref 8.9–10.3)
CO2: 32 mmol/L (ref 22–32)
Chloride: 97 mmol/L — ABNORMAL LOW (ref 101–111)
Creatinine, Ser: 1.12 mg/dL — ABNORMAL HIGH (ref 0.44–1.00)
GFR calc non Af Amer: 48 mL/min — ABNORMAL LOW (ref >60)
GFR, EST AFRICAN AMERICAN: 55 mL/min — AB (ref >60)
GLUCOSE: 93 mg/dL (ref 65–99)
Potassium: 4.2 mmol/L (ref 3.5–5.1)
SODIUM: 140 mmol/L (ref 135–145)

## 2015-04-24 LAB — CBC WITH DIFFERENTIAL/PLATELET
BASOS ABS: 0.1 10*3/uL (ref 0–0.1)
BASOS PCT: 1 %
Eosinophils Absolute: 0.1 10*3/uL (ref 0–0.7)
Eosinophils Relative: 1 %
HEMATOCRIT: 46.3 % (ref 35.0–47.0)
HEMOGLOBIN: 15.5 g/dL (ref 12.0–16.0)
Lymphocytes Relative: 12 %
Lymphs Abs: 1.2 10*3/uL (ref 1.0–3.6)
MCH: 29.9 pg (ref 26.0–34.0)
MCHC: 33.5 g/dL (ref 32.0–36.0)
MCV: 89.2 fL (ref 80.0–100.0)
Monocytes Absolute: 0.7 10*3/uL (ref 0.2–0.9)
Monocytes Relative: 7 %
Neutro Abs: 8.2 10*3/uL — ABNORMAL HIGH (ref 1.4–6.5)
Neutrophils Relative %: 79 %
Platelets: 247 10*3/uL (ref 150–440)
RBC: 5.19 MIL/uL (ref 3.80–5.20)
RDW: 15 % — AB (ref 11.5–14.5)
WBC: 10.3 10*3/uL (ref 3.6–11.0)

## 2015-04-24 LAB — URINALYSIS COMPLETE WITH MICROSCOPIC (ARMC ONLY)
BILIRUBIN URINE: NEGATIVE
Bacteria, UA: NONE SEEN — AB
Glucose, UA: NEGATIVE mg/dL
Hgb urine dipstick: NEGATIVE
KETONES UR: NEGATIVE mg/dL
LEUKOCYTES UA: NEGATIVE
Nitrite: NEGATIVE
Protein, ur: NEGATIVE mg/dL
Specific Gravity, Urine: 1.015 (ref 1.005–1.030)
pH: 8.5 — ABNORMAL HIGH (ref 5.0–8.0)

## 2015-04-24 LAB — FIBRIN DERIVATIVES D-DIMER (ARMC ONLY): Fibrin derivatives D-dimer (ARMC): 1285 — ABNORMAL HIGH (ref 0–499)

## 2015-04-24 MED ORDER — IPRATROPIUM-ALBUTEROL 0.5-2.5 (3) MG/3ML IN SOLN
3.0000 mL | Freq: Once | RESPIRATORY_TRACT | Status: AC
  Administered 2015-04-24: 3 mL via RESPIRATORY_TRACT

## 2015-04-24 NOTE — Discharge Instructions (Signed)
Hemoptysis Hemoptysis, which means coughing up blood, can be a sign of a minor problem or a serious medical condition. The blood that is coughed up may come from the lungs and airways. Coughed-up blood can also come from bleeding that occurs outside the lungs and airways. Blood can drain into the windpipe during a severe nosebleed or when blood is vomited from the stomach. Because hemoptysis can be a sign of something serious, a medical evaluation is required. For some people with hemoptysis, no definite cause is ever identified. CAUSES  The most common cause of hemoptysis is bronchitis. Some other common causes include:   A ruptured blood vessel caused by coughing or an infection.   A medical condition that causes damage to the large air passageways (bronchiectasis).   A blood clot in the lungs (pulmonary embolism).   Pneumonia.   Tuberculosis.   Breathing in a small foreign object.   Cancer. For some people with hemoptysis, no definite cause is ever identified.  HOME CARE INSTRUCTIONS  Only take over-the-counter or prescription medicines as directed by your caregiver. Do not use cough suppressants unless your caregiver approves.  If your caregiver prescribes antibiotic medicines, take them as directed. Finish them even if you start to feel better.  Do not smoke. Also avoid secondhand smoke.  Follow up with your caregiver as directed. SEEK IMMEDIATE MEDICAL CARE IF:   You cough up bloody mucus for longer than a week.  You have a blood-producing cough that is severe or getting worse.  You have a blood-producing cough thatcomes and goes over time.  You develop problems with your breathing.   You vomit blood.  You develop bloody or black-colored stools.  You have chest pain.   You develop night sweats.  You feel faint or pass out.   You have a fever or persistent symptoms for more than 2-3 days.  You have a fever and your symptoms suddenly get worse. MAKE  SURE YOU:  Understand these instructions.  Will watch your condition.  Will get help right away if you are not doing well or get worse. Document Released: 01/23/2002 Document Revised: 10/31/2012 Document Reviewed: 08/31/2012 [facility] Patient Information 2015 [place], [place]. This information is not intended to replace advice given to you by your health care provider. Make sure you discuss any questions you have with your health care provider. Pulmonary Embolism A pulmonary (lung) embolism (PE) is a blood clot that has traveled to the lung and results in a blockage of blood flow in the affected lung. Most clots come from deep veins in the legs or pelvis. PE is a dangerous and potentially life-threatening condition that can be treated if identified. CAUSES Blood clots form in a vein for different reasons. Usually several things cause blood clots. They include:  The flow of blood slows down.  The inside of the vein is damaged in some way.  The person has a condition that makes the blood clot more easily. RISK FACTORS Some people are more likely than others to develop PE. Risk factors include:   Smoking.  Being overweight (obese).  Sitting or lying still for a long time. This includes long-distance travel, paralysis, or recovery from an illness or surgery. Other factors that increase risk are:   Older age, especially over 37 years of age.  Having a family history of blood clots or if you have already had a blood clot.  Having major or lengthy surgery. This is especially true for surgery on the hip, knee, or  belly (abdomen). Hip surgery is particularly high risk.  Having a long, thin tube (catheter) placed inside a vein during a medical procedure.  Breaking a hip or leg.  Having cancer or cancer treatment.  Medicines containing the female hormone estrogen. This includes birth control pills and hormone replacement therapy.  Other circulation or heart problems.  Pregnancy and  childbirth.  Hormone changes make the blood clot more easily during pregnancy.  The fetus puts pressure on the veins of the pelvis.  There is a risk of injury to veins during delivery or a caesarean delivery. The risk is highest just after childbirth.  PREVENTION   Exercise the legs regularly. Take a brisk 30 minute walk every day.  Maintain a weight that is appropriate for your height.  Avoid sitting or lying in bed for long periods of time without moving your legs.  Women, particularly those over the age of 70 years, should consider the risks and benefits of taking estrogen medicines, including birth control pills.  Do not smoke, especially if you take estrogen medicines.  Long-distance travel can increase your risk. You should exercise your legs by walking or pumping the muscles every hour.  Many of the risk factors above relate to situations that exist with hospitalization, either for illness, injury, or elective surgery. Prevention may include medical and nonmedical measures.   Your health care provider will assess you for the need for venous thromboembolism prevention when you are admitted to the hospital. If you are having surgery, your surgeon will assess you the day of or day after surgery.  SYMPTOMS  The symptoms of a PE usually start suddenly and include:  Shortness of breath.  Coughing.  Coughing up blood or blood-tinged mucus.  Chest pain. Pain is often worse with deep breaths.  Rapid heartbeat. DIAGNOSIS  If a PE is suspected, your health care provider will take a medical history and perform a physical exam. Other tests that may be required include:  Blood tests, such as studies of the clotting properties of your blood.  Imaging tests, such as ultrasound, CT, MRI, and other tests to see if you have clots in your legs or lungs.  An electrocardiogram. This can look for heart strain from blood clots in the lungs. TREATMENT   The most common treatment for a  PE is blood thinning (anticoagulant) medicine, which reduces the blood's tendency to clot. Anticoagulants can stop new blood clots from forming and old clots from growing. They cannot dissolve existing clots. Your body does this by itself over time. Anticoagulants can be given by mouth, through an intravenous (IV) tube, or by injection. Your health care provider will determine the best program for you.  Less commonly, clot-dissolving medicines (thrombolytics) are used to dissolve a PE. They carry a high risk of bleeding, so they are used mainly in severe cases.  Very rarely, a blood clot in the leg needs to be removed surgically.  If you are unable to take anticoagulants, your health care provider may arrange for you to have a filter placed in a main vein in your abdomen. This filter prevents clots from traveling to your lungs. HOME CARE INSTRUCTIONS   Take all medicines as directed by your health care provider.  Learn as much as you can about DVT.  Wear a medical alert bracelet or carry a medical alert card.  Ask your health care provider how soon you can go back to normal activities. It is important to stay active to prevent blood clots.  If you are on anticoagulant medicine, avoid contact sports.  It is very important to exercise. This is especially important while traveling, sitting, or standing for long periods of time. Exercise your legs by walking or by tightening and relaxing your leg muscles regularly. Take frequent walks.  You may need to wear compression stockings. These are tight elastic stockings that apply pressure to the lower legs. This pressure can help keep the blood in the legs from clotting. Taking Warfarin Warfarin is a daily medicine that is taken by mouth. Your health care provider will advise you on the length of treatment (usually 3-6 months, sometimes lifelong). If you take warfarin:  Understand how to take warfarin and foods that can affect how warfarin works in Insurance claims handler.  Too much and too little warfarin are both dangerous. Too much warfarin increases the risk of bleeding. Too little warfarin continues to allow the risk for blood clots. Warfarin and Regular Blood Testing While taking warfarin, you will need to have regular blood tests to measure your blood clotting time. These blood tests usually include both the prothrombin time (PT) and international normalized ratio (INR) tests. The PT and INR results allow your health care provider to adjust your dose of warfarin. It is very important that you have your PT and INR tested as often as directed by your health care provider.  Warfarin and Your Diet Avoid major changes in your diet, or notify your health care provider before changing your diet. Arrange a visit with a registered dietitian to answer your questions. Many foods, especially foods high in vitamin K, can interfere with warfarin and affect the PT and INR results. You should eat a consistent amount of foods high in vitamin K. Foods high in vitamin K include:   Spinach, kale, broccoli, cabbage, collard and turnip greens, Brussels sprouts, peas, cauliflower, seaweed, and parsley.  Beef and pork liver.  Green tea.  Soybean oil. Warfarin with Other Medicines Many medicines can interfere with warfarin and affect the PT and INR results. You must:  Tell your health care provider about any and all medicines, vitamins, and supplements you take, including aspirin and other over-the-counter anti-inflammatory medicines. Be especially cautious with aspirin and anti-inflammatory medicines. Ask your health care provider before taking these.  Do not take or discontinue any prescribed or over-the-counter medicine except on the advice of your health care provider or pharmacist. Warfarin Side Effects Warfarin can have side effects, such as easy bruising and difficulty stopping bleeding. Ask your health care provider or pharmacist about other side effects of warfarin.  You will need to:  Hold pressure over cuts for longer than usual.  Notify your dentist and other health care providers that you are taking warfarin before you undergo any procedures where bleeding may occur. Warfarin with Alcohol and Tobacco   Drinking alcohol frequently can increase the effect of warfarin, leading to excess bleeding. It is best to avoid alcoholic drinks or consume only very small amounts while taking warfarin. Notify your health care provider if you change your alcohol intake.  Do not use any tobacco products including cigarettes, chewing tobacco, or electronic cigarettes. If you smoke, quit. Ask your health care provider for help with quitting smoking. Alternative Medicines to Warfarin: Factor Xa Inhibitor Medicines  These blood thinning medicines are taken by mouth, usually for several weeks or longer. It is important to take the medicine every single day, at the same time each day.  There are no regular blood tests required when  using these medicines.  There are fewer food and drug interactions than with warfarin.  The side effects of this class of medicine is similar to that of warfarin, including excessive bruising or bleeding. Ask your health care provider or pharmacist about other potential side effects. SEEK MEDICAL CARE IF:   You notice a rapid heartbeat.  You feel weaker or more tired than usual.  You feel faint.  You notice increased bruising.  Your symptoms are not getting better in the time expected.  You are having side effects of medicine. SEEK IMMEDIATE MEDICAL CARE IF:   You have chest pain.  You have trouble breathing.  You have new or increased swelling or pain in one leg.  You cough up blood.  You notice blood in vomit, in a bowel movement, or in urine.  You have a fever. Symptoms of PE may represent a serious problem that is an emergency. Do not wait to see if the symptoms will go away. Get medical help right away. Call your local  emergency services (911 in the [place]). Do not drive yourself to the hospital. Document Released: 11/11/2000 Document Revised: 03/31/2014 Document Reviewed: 11/25/2013 [facility] Patient Information 2015 [place], [place]. This information is not intended to replace advice given to you by your health care provider. Make sure you discuss any questions you have with your health care provider.

## 2015-04-24 NOTE — ED Notes (Signed)
Patient c/o lower back pain and a cough for the past 2-3 days.  Patient denies fevers.  Patient denies N/V.  Patient reports coughing up small specks of blood yesterday.

## 2015-04-24 NOTE — ED Notes (Signed)
Pt ambulatory to triage with reports that she has been having cough and sob x 2 days. Reports coughing up blood at times. Went to [facility] urgent care and xray suspected PE. Sent here for CT scan.

## 2015-04-24 NOTE — ED Provider Notes (Signed)
CSN: [id number]     Arrival date & time 04/24/15  1022 History   First MD Initiated Contact with Patient 04/24/15 1241     Chief Complaint  Patient presents with  . Cough  . Back Pain   (Consider location/radiation/quality/duration/timing/severity/associated sxs/prior Treatment) HPI Comments: Caucasian smoker with history of bronchitis c/o brown, black flecks in sputum after bronchitis and COPD diagnosis at [facility] Apr 2016 (weeks) but new bright red blood flecks in sputum x 2 days.  Chronic cough for months (>6 months).  Intermittent SOB and wheezing using symbicort helps some also has been having some RUQ/right thorax pain radiating to right lateral neck intermittent.  Fatigue   Patient is a 74 y.o. female presenting with cough and back pain. The history is provided by the patient.  Cough Cough characteristics:  Productive Sputum characteristics:  [place] and bloody Severity:  Moderate Onset quality:  Gradual Duration:  6 weeks Timing:  Intermittent Progression:  Worsening Chronicity:  Chronic Smoker: yes   Context: exposure to allergens, smoke exposure, upper respiratory infection, weather changes and with activity   Context: not animal exposure, not fumes, not occupational exposure and not sick contacts   Relieved by:  Nothing Worsened by:  Lying down Ineffective treatments:  None tried Associated symptoms: headaches, myalgias, shortness of breath and wheezing   Associated symptoms: no chest pain, no chills, no diaphoresis, no ear fullness, no ear pain, no eye discharge, no fever, no rash, no rhinorrhea, no sinus congestion, no sore throat and no weight loss   Headaches:    Severity:  Moderate   Onset quality:  Sudden   Duration:  3 days   Timing:  Intermittent   Progression:  Unchanged   Chronicity:  New Myalgias:    Location:  Neck   Quality:  Aching   Severity:  Moderate   Onset quality:  Gradual   Duration:  3 days   Timing:  Intermittent   Progression:   Unchanged Shortness of breath:    Severity:  Moderate   Onset quality:  Gradual   Timing:  Intermittent   Progression:  Unchanged Wheezing:    Severity:  Mild   Onset quality:  Gradual   Duration:  3 days   Timing:  Intermittent   Progression:  Unchanged   Chronicity:  Recurrent Risk factors: recent infection   Risk factors: no chemical exposure and no recent travel   Back Pain Associated symptoms: abdominal pain and headaches   Associated symptoms: no chest pain, no dysuria, no fever, no numbness, no weakness and no weight loss     Past Medical History  Diagnosis Date  . Hypertension   . COPD (chronic obstructive pulmonary disease)   . Hyperlipidemia   . OSA (obstructive sleep apnea)   . Presence of permanent cardiac pacemaker   . GERD (gastroesophageal reflux disease)   . Stomach ulcer    Past Surgical History  Procedure Laterality Date  . Total abdominal hysterectomy  1980  . Pacemaker insertion  2012  . Cardiac catheterization  2007  . Hiatal hernia repair  2007  . Appendectomy  2006  . Colonoscopy with propofol N/A 04/14/2015    Procedure: COLONOSCOPY WITH PROPOFOL;  Surgeon: Lollie Sails, MD;  Location: [facility] ENDOSCOPY;  Service: Endoscopy;  Laterality: N/A;  . Esophagogastroduodenoscopy N/A 04/14/2015    Procedure: ESOPHAGOGASTRODUODENOSCOPY (EGD);  Surgeon: Lollie Sails, MD;  Location: [facility] ENDOSCOPY;  Service: Endoscopy;  Laterality: N/A;   Family History  Problem Relation Age of Onset  .  Adopted: Yes  . Family history unknown: Yes   History  Substance Use Topics  . Smoking status: Current Every Day Smoker -- 1.00 packs/day for 55 years    Types: Cigarettes  . Smokeless tobacco: Never Used  . Alcohol Use: No   OB History    No data available     Review of Systems  Constitutional: Positive for fatigue. Negative for fever, chills, weight loss, diaphoresis, activity change and appetite change.  HENT: Positive for sneezing. Negative for  congestion, dental problem, drooling, ear discharge, ear pain, facial swelling, hearing loss, mouth sores, nosebleeds, postnasal drip, rhinorrhea, sinus pressure, sore throat, tinnitus, trouble swallowing and voice change.   Eyes: Negative for photophobia, pain, discharge, redness, itching and visual disturbance.  Respiratory: Positive for cough, shortness of breath and wheezing.   Cardiovascular: Negative for chest pain.  Gastrointestinal: Positive for abdominal pain. Negative for nausea, vomiting, diarrhea, constipation, blood in stool, abdominal distention, anal bleeding and rectal pain.  Endocrine: Negative for cold intolerance and heat intolerance.  Genitourinary: Negative for dysuria, hematuria and difficulty urinating.  Musculoskeletal: Positive for myalgias, back pain and neck pain. Negative for joint swelling, arthralgias, gait problem and neck stiffness.  Skin: Negative for color change, pallor and rash.  Allergic/Immunologic: Positive for environmental allergies. Negative for food allergies.  Neurological: Positive for headaches. Negative for dizziness, tremors, syncope, facial asymmetry, speech difficulty, weakness, light-headedness and numbness.  Hematological: Negative for adenopathy. Does not bruise/bleed easily.  Psychiatric/Behavioral: Negative for behavioral problems, confusion and agitation.    Allergies  Contrast media; Lovastatin; and Statins  Home Medications   Prior to Admission medications   Medication Sig Start Date End Date Taking? Authorizing Provider  acetaminophen (TYLENOL) 500 MG tablet Take 2 tablets by mouth daily as needed. 03/27/12   Historical Provider, MD  albuterol (PROVENTIL HFA) 108 (90 BASE) MCG/ACT inhaler Inhale 2 puffs into the lungs every 6 (six) hours as needed.    Historical Provider, MD  ALPRAZolam Duanne Moron) 0.25 MG tablet Take 0.25 mg by mouth as needed.     Historical Provider, MD  budesonide-formoterol (SYMBICORT) 160-4.5 MCG/ACT inhaler Inhale  1 puff into the lungs 2 (two) times daily.    Historical Provider, MD  calcium carbonate 200 MG capsule Take 500 mg by mouth daily.    Historical Provider, MD  citalopram (CELEXA) 10 MG tablet Take 10 mg by mouth daily.    Historical Provider, MD  fluticasone (FLONASE) 50 MCG/ACT nasal spray Place 2 sprays into both nostrils daily.    Historical Provider, MD  gabapentin (NEURONTIN) 100 MG capsule Take 200 mg by mouth 3 (three) times daily.     Historical Provider, MD  hydrochlorothiazide (HYDRODIURIL) 25 MG tablet Take 25 mg by mouth daily.    Historical Provider, MD  hyoscyamine (LEVBID) 0.375 MG 12 hr tablet Take 1 tablet by mouth daily. 04/09/15   Historical Provider, MD  losartan (COZAAR) 50 MG tablet Take 1 tablet by mouth daily. 08/28/14   Historical Provider, MD  magnesium oxide (MAG-OX) 400 MG tablet Take 400 mg by mouth daily.    Historical Provider, MD  meclizine (ANTIVERT) 25 MG tablet Take 1 tablet by mouth daily as needed. Dizzyness 10/23/13   Historical Provider, MD  mirtazapine (REMERON) 30 MG tablet Take 30 mg by mouth at bedtime.    Historical Provider, MD  Multiple Vitamin (MULTIVITAMIN) capsule Take 1 capsule by mouth daily.    Historical Provider, MD  pantoprazole (PROTONIX) 40 MG tablet Take 40 mg by  mouth 2 (two) times daily.    Historical Provider, MD  polyethylene glycol powder (GLYCOLAX/MIRALAX) powder Take 1 Container by mouth once.    Historical Provider, MD  pravastatin (PRAVACHOL) 20 MG tablet Take 20 mg by mouth daily.    Historical Provider, MD  predniSONE (DELTASONE) 10 MG tablet Take 10-60 mg by mouth as directed. 6 Tablets x 2d, 5 x 2d, 4 x 2d, 3 x 2d, 2 x 2d, 1 x 2d for a total of 12 days 04/06/15   Historical Provider, MD  Suvorexant 10 MG TABS Take 1 tablet by mouth at bedtime as needed (sleep).    Historical Provider, MD  traZODone (DESYREL) 150 MG tablet Take 150 mg by mouth at bedtime.    Historical Provider, MD  varenicline (CHANTIX) 0.5 MG tablet Take 1 tablet  (0.5 mg total) by mouth 2 (two) times daily. 12/10/12   Juanito Doom, MD  zolpidem (AMBIEN) 5 MG tablet Take 5 mg by mouth at bedtime as needed for sleep.    Historical Provider, MD   BP 136/64 mmHg  Pulse 84  Temp(Src) 97.7 F (36.5 C) (Tympanic)  Resp 16  Ht 5\' 4"  (1.626 m)  Wt 166 lb (75.297 kg)  BMI 28.48 kg/m2  SpO2 98%  LMP  (Approximate) Physical Exam  Constitutional: She is oriented to person, place, and time. Vital signs are normal. She appears well-developed and well-nourished.  HENT:  Head: Normocephalic and atraumatic.  Right Ear: External ear normal.  Left Ear: External ear normal.  Nose: Nose normal.  Mouth/Throat: Oropharynx is clear and moist. No oropharyngeal exudate.  Eyes: Conjunctivae, EOM and lids are normal. Pupils are equal, round, and reactive to light.  Neck: Trachea normal and normal range of motion. Neck supple. No JVD present. No tracheal deviation present.  Cardiovascular: Normal rate, regular rhythm, normal heart sounds and intact distal pulses.   Pulmonary/Chest: Effort normal and breath sounds normal. No stridor. No respiratory distress. She has no wheezes. She has no rales. She exhibits no tenderness.  Abdominal: Soft. Bowel sounds are normal. She exhibits no shifting dullness, no distension, no pulsatile liver, no fluid wave, no abdominal bruit, no ascites, no pulsatile midline mass and no mass. There is no tenderness. There is no rebound, no guarding and no CVA tenderness.  Musculoskeletal: Normal range of motion. She exhibits no edema or tenderness.  Lymphadenopathy:    She has no cervical adenopathy.  Neurological: She is alert and oriented to person, place, and time.  Skin: Skin is warm, dry and intact. No rash noted. No erythema. No pallor.  Psychiatric: She has a normal mood and affect. Her speech is normal and behavior is normal. Judgment and thought content normal. Cognition and memory are normal.  Nursing note and vitals reviewed.   ED  Course  Procedures (including critical care time) Labs Review Labs Reviewed  URINALYSIS COMPLETEWITH MICROSCOPIC ([facility] ONLY) - Abnormal; Notable for the following:    APPearance HAZY (*)    pH 8.5 (*)    Bacteria, UA NONE SEEN (*)    Squamous Epithelial / LPF 0-5 (*)    All other components within normal limits  CBC WITH DIFFERENTIAL/PLATELET - Abnormal; Notable for the following:    RDW 15.0 (*)    Neutro Abs 8.2 (*)    All other components within normal limits  BASIC METABOLIC PANEL - Abnormal; Notable for the following:    Chloride 97 (*)    Creatinine, Ser 1.12 (*)    GFR  calc non Af Amer 48 (*)    GFR calc Af Amer 55 (*)    All other components within normal limits    Imaging Review Dg Chest 2 View  04/24/2015   CLINICAL DATA:  COPD. Right chest pain with cough for several days. Cough and hemoptysis.  EXAM: CHEST  2 VIEW  COMPARISON:  03/17/2015  FINDINGS: Stable appearance in position of pacemaker. Atherosclerotic aortic arch. Tortuous thoracic aorta. Heart size within normal limits.  Mild thoracic spondylosis.  Peripheral abnormal subpleural density in the right mid lung measuring approximately 3.0 by 1.3 cm. No overlying rib lesion is seen.  IMPRESSION: 1. Peripheral density in the right mid lung. Given the right-sided chest pain and hemoptysis, consider CT angiography of the chest if there is thought to potentially be risk for pulmonary embolus. 2. Atherosclerosis.   Electronically Signed   By: Van Clines M.D.   On: 04/24/2015 14:28   1418 patient ambulatory to radiology for chest xray.  She reported she is feeling better, easier breathing since duoneb completed sp02 improved to 98% RA.   Discussed BMP and CBC results with patient and to follow up with PCM routine for borderline GFR/Cr (no previous results in EPIC unable to verify if chronic/acute at time of visit), avoid dehydration, use of NSAIDs OTC.  Patient given copy of results to share with PCM.  Patient verbalized  understanding of information/instructions, agreed with plan of care and had no further questions at this time.  MDM   1. Cough with hemoptysis   2. Acute renal impairment    1600 discussed chest xray results and given copy of radiology report to patient possible pulmonary embolism recommend CT angiogram with contrast but due to previous reaction feeling like she was on fire from the inside requires premedication protocol.  Discussed options with patient e.g. CT angiogram with contrast, without contrast and VQ scan and she preferred to proceed with CT angiogram with contrast and premedication protocol at [facility] ER.  Called report to [employer].  Patient with history of COPD and bronchitis seen by [facility] Apr 2016.  Hx tobacco use.   The appearance of sputum is not predictive of whether a bacterial infection is present.  Purulent sputum is most often caused by viral infections.  There are a small portion of those caused by non-viral agents being Mycoplamsa pneumonia.  Microscopic examination or C&S of sputum in the healthy adult with acute bronchitis is generally not helpful (usually negative or normal respiratory flora) other considerations being cough from upper respiratory tract infections, sinusitis or allergic syndromes (mild asthma or viral pneumonia).  Differential Diagnosis:  reactive airway disease (asthma, allergic aspergillosis (eosinophilia), chronic bronchitis, respiratory infection (Sinusitis, Common cold, pneumonia), congestive heart failure, reflux esophagitis, bronchogenic tumor, aspiration syndromes and/or exposure irritants/tobacco smoke.  In this case, there is no evidence of any invasive bacterial illness so will hold on antibiotic treatment.  I discussed that approximately 50% of patients with acute bronchitis have a cough that lasts up to three weeks, and 25% for over a month.  Tylenol, one to two tablets every four hours as needed for fever or myalgias.   No aspirin.  Patient instructed to follow  up in one week or sooner if symptoms worsen with PCM/ER after hours.  Patient POV transport to [facility] ED. Patient verbalized agreement and understanding of treatment plan.  P2:  hand washing and cover cough     Olen Cordial, NP 04/24/15 1704

## 2015-04-24 NOTE — ED Provider Notes (Signed)
[facility] Emergency Department Provider Note  ____________________________________________  Time seen: 7:25 PM  I have reviewed the triage vital signs and the nursing notes.   HISTORY  Chief Complaint Cough and Shortness of Breath  Urgent care note from earlier today reviewed.  HPI Crystal White is a 74 y.o. female with a history of COPD who reports that she's been treated for bronchitis for the past 4 weeks. She has completed 2 courses of steroids and antibiotics from her primary care doctor. In the midst of all this, she reports that she is not having any chest pain specifically but does have some right lateral rib pain. It is not pleuritic but she feels mildly short of breath. She's had this chronic cough for 4 weeks, and earlier today she noted a small pea size amount of blood tinge in her sputum. No exertional or pleuritic pain, no fevers chills lightheadedness syncope abdominal pain nausea vomiting or diarrhea.  At urgent care today she had a chest x-ray which showed a possible wedge-shaped density on the right lateral aspect of her right lung, concerning for wedge infarct from PE.     Past Medical History  Diagnosis Date  . Hypertension   . COPD (chronic obstructive pulmonary disease)   . Hyperlipidemia   . OSA (obstructive sleep apnea)   . Presence of permanent cardiac pacemaker   . GERD (gastroesophageal reflux disease)   . Stomach ulcer     Patient Active Problem List   Diagnosis Date Noted  . Sprain of ankle 04/14/2015  . Anxiety 04/14/2015  . Barrett's esophagus 04/14/2015  . Acute exacerbation of chronic obstructive airways disease 04/14/2015  . Bursitis 04/14/2015  . Cellulitis 04/14/2015  . Claudication 04/14/2015  . COPD, moderate 04/14/2015  . Cough 04/14/2015  . Deficiency, 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  . Clinical depression 04/14/2015  . Dizziness 04/14/2015  . Dyslipidemia 04/14/2015  . Essential (primary)  hypertension 04/14/2015  . Flank pain 04/14/2015  . H/O suicide attempt 04/14/2015  . Dysphonia 04/14/2015  . Hypercholesteremia 04/14/2015  . Cannot sleep 04/14/2015  . Malaise and fatigue 04/14/2015  . Mild cognitive impairment 04/14/2015  . Cramp in muscle 04/14/2015  . Muscle ache 04/14/2015  . Neuropathy 04/14/2015  . Adiposity 04/14/2015  . 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 Candida infection of mouth 04/14/2015  . Abnormal loss of weight 04/14/2015  . Artificial cardiac pacemaker 10/08/2014  . Apnea, sleep 10/08/2014  . COPD (chronic obstructive pulmonary disease) 02/13/2012  . Tobacco abuse 02/13/2012  . Sinus congestion 02/13/2012  . CAFL (chronic airflow limitation) 02/13/2012  . Esophagitis, reflux 08/28/2006  . Abdominal pain, right lower quadrant 08/22/2005  . Acute appendicitis with peritoneal abscess 08/22/2005    Past Surgical History  Procedure Laterality Date  . Total abdominal hysterectomy  1980  . Pacemaker insertion  2012  . Cardiac catheterization  2007  . Hiatal hernia repair  2007  . Appendectomy  2006  . Colonoscopy with propofol N/A 04/14/2015    Procedure: COLONOSCOPY WITH PROPOFOL;  Surgeon: Lollie Sails, MD;  Location: [facility] ENDOSCOPY;  Service: Endoscopy;  Laterality: N/A;  . Esophagogastroduodenoscopy N/A 04/14/2015    Procedure: ESOPHAGOGASTRODUODENOSCOPY (EGD);  Surgeon: Lollie Sails, MD;  Location: [facility] ENDOSCOPY;  Service: Endoscopy;  Laterality: N/A;    Current Outpatient Rx  Name  Route  Sig  Dispense  Refill  . acetaminophen (TYLENOL) 500 MG tablet   Oral   Take 2 tablets by mouth every 4 (four) hours as needed  for mild pain.          Marland Kitchen albuterol (PROVENTIL HFA) 108 (90 BASE) MCG/ACT inhaler   Inhalation   Inhale 2 puffs into the lungs every 6 (six) hours as needed for wheezing or shortness of breath.          . ALPRAZolam (XANAX) 0.25 MG tablet   Oral   Take 0.25 mg by mouth 2 (two) times daily as needed for anxiety.           . budesonide-formoterol (SYMBICORT) 160-4.5 MCG/ACT inhaler   Inhalation   Inhale 1 puff into the lungs 2 (two) times daily.         . fluticasone (FLONASE) 50 MCG/ACT nasal spray   Each Nare   Place 2 sprays into both nostrils daily as needed for rhinitis.          Marland Kitchen gabapentin (NEURONTIN) 100 MG capsule   Oral   Take 200 mg by mouth 3 (three) times daily.          . hydrochlorothiazide (HYDRODIURIL) 25 MG tablet   Oral   Take 25 mg by mouth daily.         . hyoscyamine (LEVBID) 0.375 MG 12 hr tablet   Oral   Take 0.375 mg by mouth 2 (two) times daily as needed for cramping.         Marland Kitchen losartan (COZAAR) 50 MG tablet   Oral   Take 1 tablet by mouth daily.         . meclizine (ANTIVERT) 25 MG tablet   Oral   Take 1 tablet by mouth 2 (two) times daily as needed for dizziness.          . mirtazapine (REMERON) 30 MG tablet   Oral   Take 30 mg by mouth at bedtime.         . pantoprazole (PROTONIX) 40 MG tablet   Oral   Take 40 mg by mouth 2 (two) times daily.         . pravastatin (PRAVACHOL) 20 MG tablet   Oral   Take 20 mg by mouth at bedtime as needed.          . traZODone (DESYREL) 150 MG tablet   Oral   Take 150 mg by mouth at bedtime.         Marland Kitchen zolpidem (AMBIEN) 5 MG tablet   Oral   Take 5 mg by mouth at bedtime as needed for sleep.         . varenicline (CHANTIX) 0.5 MG tablet   Oral   Take 1 tablet (0.5 mg total) by mouth 2 (two) times daily. Patient not taking: Reported on 04/24/2015   70 tablet   2     Start taking one tablet daily for three days, then ...     Allergies Contrast media and Statins  Family History  Problem Relation Age of Onset  . Adopted: Yes  . Family history unknown: Yes    Social History History  Substance Use Topics  . Smoking status: Current Every Day Smoker -- 1.00 packs/day for 55 years    Types: Cigarettes  . Smokeless tobacco: Never Used  . Alcohol Use: No    Review of  Systems  Constitutional: No fever or chills. No weight changes Eyes:No blurry vision or double vision.  ENT: No sore throat. Cardiovascular: No chest pain. Respiratory: Chronic dyspnea, long-standing cough Gastrointestinal: Negative for abdominal pain, vomiting and diarrhea.  No BRBPR or  melena. Genitourinary: Negative for dysuria, urinary retention, bloody urine, or difficulty urinating. Musculoskeletal: Right chest wall pain. Skin: Negative for rash. Neurological: Negative for headaches, focal weakness or numbness. Psychiatric:No anxiety or depression.   Endocrine:No hot/cold intolerance, changes in energy, or sleep difficulty.  10-point ROS otherwise negative.  ____________________________________________   PHYSICAL EXAM:  VITAL SIGNS: ED Triage Vitals  Enc Vitals Group     BP 04/24/15 1709 148/84 mmHg     Pulse Rate 04/24/15 1709 88     Resp 04/24/15 1709 18     Temp 04/24/15 1709 98.1 F (36.7 C)     Temp Source 04/24/15 1709 Oral     SpO2 04/24/15 1709 93 %     Weight 04/24/15 1709 166 lb (75.297 kg)     Height 04/24/15 1709 5\' 3"  (1.6 m)     Head Cir --      Peak Flow --      Pain Score 04/24/15 1710 5     Pain Loc --      Pain Edu? --      Excl. in [place]? --      Constitutional: Alert and oriented. Well appearing and in no distress. Eyes: No scleral icterus. No conjunctival pallor. PERRL. EOMI ENT   Head: Normocephalic and atraumatic.   Nose: No congestion/rhinnorhea. No septal hematoma   Mouth/Throat: MMM, no pharyngeal erythema. No peritonsillar mass. No uvula shift.   Neck: No stridor. No SubQ emphysema. No meningismus. Hematological/Lymphatic/Immunilogical: No cervical lymphadenopathy. Cardiovascular: RRR. Normal and symmetric distal pulses are present in all extremities. No murmurs, rubs, or gallops. Respiratory: Normal respiratory effort without tachypnea nor retractions. Breath sounds are clear and equal bilaterally. No  wheezes/rales/rhonchi. Right lateral mid chest wall tender to the touch in the area of indicated pain corresponding to the chest x-ray findings Gastrointestinal: Soft and nontender. No distention. There is no CVA tenderness.  No rebound, rigidity, or guarding. Genitourinary: deferred Musculoskeletal: Nontender with normal range of motion in all extremities. No joint effusions.  No lower extremity tenderness.  No edema. Neurologic:   Normal speech and language.  CN 2-10 normal. Motor grossly intact. No pronator drift.  Normal gait. No gross focal neurologic deficits are appreciated.  Skin:  Skin is warm, dry and intact. No rash noted.  No petechiae, purpura, or bullae. Psychiatric: Mood and affect are normal. Speech and behavior are normal. Patient exhibits appropriate insight and judgment.  ____________________________________________    LABS (pertinent positives/negatives) (all labs ordered are listed, but only abnormal results are displayed) Labs Reviewed  FIBRIN DERIVATIVES D-DIMER ([facility] ONLY) - Abnormal; Notable for the following:    Fibrin derivatives D-dimer [facility]) 1285 (*)    All other components within normal limits   ____________________________________________   EKG  Interpreted by me Normal sinus rhythm rate 84. Normal axis intervals ST segments and T waves no STEMI or ischemic changes  ____________________________________________    RADIOLOGY  Chest x-ray from earlier today reviewed, shows small wedge shaped peripheral density in the right lateral lung. I reviewed and interpreted this film myself and it does appear that the noted lesion is an area where multiple rib curvatures are overlapping on the projection causing increased density in that area due to multiple bone planes. The shape of the lesion exactly matches the curvature and direction of the ribs and it is unlikely to be a real finding.  VQ scan  pending  ____________________________________________   PROCEDURES  ____________________________________________   INITIAL IMPRESSION / ASSESSMENT AND PLAN /  ED COURSE  Pertinent labs & imaging results that were available during my care of the patient were reviewed by me and considered in my medical decision making (see chart for details).  Patient presents with symptoms consistent with acute bronchitis in the setting of COPD with a prolonged course. Her symptoms are not consistent with PE. Additionally, her vital signs do not show any acute abnormalities that would suggest PE or a hemodynamically significant embolus. Due to the chest x-ray finding we will check a d-dimer and if this is unremarkable we will discharge her home was symptomatically treatment for her bronchitis.  ----------------------------------------- 10:42 PM on 04/24/2015 -----------------------------------------  D-dimer markedly elevated at 1250. We will order a V/Q VQ scan due to her previous allergy to IV contrast.  Nuclear medicine technician has been called and reports that they will be able to arrive and perform the study at about 1:00 AM. Care of the patient will be signed out to the oncoming physician Dr. Delman Kitten to follow up on the scan.   ____________________________________________   FINAL CLINICAL IMPRESSION(S) / ED DIAGNOSES  Final diagnoses:  Dyspnea  Acute on chronic bronchitis    Carrie Mew, MD 04/25/15 [phone]

## 2015-04-24 NOTE — ED Notes (Signed)
Provider states she is ordering CTA per radiologist recommendation.

## 2015-04-25 DIAGNOSIS — R05 Cough: Secondary | ICD-10-CM | POA: Diagnosis not present

## 2015-04-25 DIAGNOSIS — R0602 Shortness of breath: Secondary | ICD-10-CM | POA: Diagnosis not present

## 2015-04-25 MED ORDER — TECHNETIUM TO 99M ALBUMIN AGGREGATED
4.0000 | Freq: Once | INTRAVENOUS | Status: AC | PRN
  Administered 2015-04-25: 4.37 via INTRAVENOUS

## 2015-04-25 MED ORDER — TECHNETIUM TC 99M DIETHYLENETRIAME-PENTAACETIC ACID
43.4700 | Freq: Once | INTRAVENOUS | Status: AC | PRN
  Administered 2015-04-25: 43.47 via INTRAVENOUS

## 2015-04-25 NOTE — ED Provider Notes (Signed)
-----------------------------------------   2:29 AM on 04/25/2015 -----------------------------------------  Patient VQ scan is low risk. The patient is awake and alert. She states that she has not had any symptoms. She feels well at present. She appears well and her lungs are clear. She does have a cough with end expiration, but no wheezing. She does have a chronic history of known COPD and denies active symptoms aside from the ongoing cough at this time.  I will discharge the patient to home, she has a pulmonologist whom she can follow up with this week as well as a primary care doctor who been following her closely.  Return precautions discussed. Patient is amenable to the plan and precautions.  Delman Kitten, MD 04/25/15 0230

## 2015-04-25 NOTE — Discharge Instructions (Signed)
Shortness of Breath  Please follow-up with your primary care physician and pulmonologist. Call Monday to set up an appointment for this week.  Return to the ER right away if you have increased trouble breathing, develop wheezing, feel you're having chest pain, or other new concerns arise.  Shortness of breath means you have trouble breathing. It could also mean that you have a medical problem. You should get immediate medical care for shortness of breath. CAUSES   Not enough oxygen in the air such as with high altitudes or a smoke-filled room.  Certain lung diseases, infections, or problems.  Heart disease or conditions, such as angina or heart failure.  Low red blood cells (anemia).  Poor physical fitness, which can cause shortness of breath when you exercise.  Chest or back injuries or stiffness.  Being overweight.  Smoking.  Anxiety, which can make you feel like you are not getting enough air. DIAGNOSIS  Serious medical problems can often be found during your physical exam. Tests may also be done to determine why you are having shortness of breath. Tests may include:  Chest X-rays.  Lung function tests.  Blood tests.  An electrocardiogram (ECG).  An ambulatory electrocardiogram. An ambulatory ECG records your heartbeat patterns over a 24-hour period.  Exercise testing.  A transthoracic echocardiogram (TTE). During echocardiography, sound waves are used to evaluate how blood flows through your heart.  A transesophageal echocardiogram (TEE).  Imaging scans. Your health care provider may not be able to find a cause for your shortness of breath after your exam. In this case, it is important to have a follow-up exam with your health care provider as directed.  TREATMENT  Treatment for shortness of breath depends on the cause of your symptoms and can vary greatly. HOME CARE INSTRUCTIONS   Do not smoke. Smoking is a common cause of shortness of breath. If you smoke, ask  for help to quit.  Avoid being around chemicals or things that may bother your breathing, such as paint fumes and dust.  Rest as needed. Slowly resume your usual activities.  If medicines were prescribed, take them as directed for the full length of time directed. This includes oxygen and any inhaled medicines.  Keep all follow-up appointments as directed by your health care provider. SEEK MEDICAL CARE IF:   Your condition does not improve in the time expected.  You have a hard time doing your normal activities even with rest.  You have any new symptoms. SEEK IMMEDIATE MEDICAL CARE IF:   Your shortness of breath gets worse.  You feel light-headed, faint, or develop a cough not controlled with medicines.  You start coughing up blood.  You have pain with breathing.  You have chest pain or pain in your arms, shoulders, or abdomen.  You have a fever.  You are unable to walk up stairs or exercise the way you normally do. MAKE SURE YOU:  Understand these instructions.  Will watch your condition.  Will get help right away if you are not doing well or get worse. Document Released: 08/09/2001 Document Revised: 11/19/2013 Document Reviewed: 01/30/2012 [facility] Patient Information 2015 [place], [place]. This information is not intended to replace advice given to you by your health care provider. Make sure you discuss any questions you have with your health care provider.

## 2015-04-29 ENCOUNTER — Ambulatory Visit
Admission: RE | Admit: 2015-04-29 | Discharge: 2015-04-29 | Disposition: A | Discharge status: Home / Self Care | Payer: Medicare Other | Source: Ambulatory Visit | Attending: Pulmonary Disease | Admitting: Pulmonary Disease

## 2015-04-29 ENCOUNTER — Encounter: Payer: Self-pay | Admitting: Pulmonary Disease

## 2015-04-29 ENCOUNTER — Ambulatory Visit (INDEPENDENT_AMBULATORY_CARE_PROVIDER_SITE_OTHER): Payer: Medicare Other | Admitting: Pulmonary Disease

## 2015-04-29 VITALS — BP 120/78 | HR 95 | Ht 64.5 in | Wt 165.0 lb

## 2015-04-29 DIAGNOSIS — Z91041 Radiographic dye allergy status: Secondary | ICD-10-CM | POA: Diagnosis not present

## 2015-04-29 DIAGNOSIS — R042 Hemoptysis: Secondary | ICD-10-CM

## 2015-04-29 DIAGNOSIS — R918 Other nonspecific abnormal finding of lung field: Secondary | ICD-10-CM | POA: Insufficient documentation

## 2015-04-29 DIAGNOSIS — Z79899 Other long term (current) drug therapy: Secondary | ICD-10-CM | POA: Diagnosis not present

## 2015-04-29 DIAGNOSIS — J432 Centrilobular emphysema: Secondary | ICD-10-CM | POA: Diagnosis not present

## 2015-04-29 DIAGNOSIS — D3501 Benign neoplasm of right adrenal gland: Secondary | ICD-10-CM | POA: Diagnosis not present

## 2015-04-29 MED ORDER — DOXYCYCLINE HYCLATE 100 MG PO TABS: 100.0000 mg | ORAL_TABLET | Freq: Two times a day (BID) | ORAL | Status: DC

## 2015-04-29 NOTE — Assessment & Plan Note (Signed)
Continue Symbicort Treat for bronchitis as above Repeat PFT and one month considering worsening dyspnea

## 2015-04-29 NOTE — Patient Instructions (Signed)
We will order a CT scan of your chest in call you with the results We will plan on seeing you back first week of July or sooner if needed Take the doxycycline 100 mg by mouth twice a day 7 days with yogurt Continue taking Symbicort

## 2015-04-29 NOTE — Progress Notes (Signed)
Subjective:    Patient ID: Crystal White, female    DOB: Apr 29, 1941, 74 y.o.   MRN: [id number]  Synopsis: Crystal White first saw the [facility] [place] [facility] in 01/2012 for COPD.  Her FEV1 was 1.89 L (52%) predicted and her mMRC score was 2.  She smokes 1/2 ppd and was still smoking in May 2013.  HPI  Chief Complaint  Patient presents with  . Acute Visit    Pt has been coughing up pea size amount of blood every morning for about a week.She has right side pain and went to urgent care on last Friday.    Crystal White has been coughing up blood for about a week. She coughs up "a pea size" amount of blood every morning.  No pain, no fevers, no chills.  She has been more short of breath lately.  She continues to take Symbicort, but it is not as good as before. She does not take blood thinners.  She is still smoking 1 ppd. She saw Urgent care and had an abnormal CXR. She then had a f/u V/Q scan which was low prob for PE.She has been under a lot of stress lately due to family issues.   Past Medical History  Diagnosis Date  . Hypertension   . COPD (chronic obstructive pulmonary disease)   . Hyperlipidemia   . OSA (obstructive sleep apnea)   . Presence of permanent cardiac pacemaker   . GERD (gastroesophageal reflux disease)   . Stomach ulcer       Review of Systems  Constitutional: Negative for fever, appetite change and unexpected weight change.  HENT: Negative for congestion, postnasal drip, rhinorrhea, sinus pressure, sneezing and trouble swallowing.   Respiratory: Positive for shortness of breath. Negative for cough, chest tightness and wheezing.        Objective:   Physical Exam  Filed Vitals:   04/29/15 0927  BP: 120/78  Pulse: 95  Height: 5' 4.5" (1.638 m)  Weight: 165 lb (74.844 kg)  SpO2: 91%   Gen: well appearing HENT: OP clear, TM's clear, neck supple PULM: CTA B, normal percussion CV: RRR, no mgr, trace edema GI: BS+, soft, nontender Derm: no cyanosis or  rash Psyche: normal mood and affect    2010 Spirometry: Ratio not reported but flow volume loop shows clear obstruction; FEV1 1.89 L or 52% predicted     Assessment & Plan:   Hemoptysis This is a new problem associated with an abnormal chest x-ray. We reviewed the images of the chest x-ray in clinic and there did appear to be a small wedge-shaped area in the periphery of the right lung. I agree with the management strategy up until this point which included VQ scan which was felt to be low probability for pulmonary embolism. Considering the lengthy smoking history she's had it's going to be important to rule out malignancy.  That said, the most likely scenario here would be an acute infection, recently she had an episode of bronchitis which is the most common cause of hemoptysis should why.  Plan: Treat empirically for a lower respiratory tract infection with 100 mg of doxycycline twice a day for a week CT scan today If the CT scan shows an abnormality worrisome for malignancy then we will see her back tomorrow If the CT scan does not show evidence of malignancy then we will see her back in one month and we will plan on pulmonary function testing at that time   COPD (chronic obstructive pulmonary  disease) Continue Symbicort Treat for bronchitis as above Repeat PFT and one month considering worsening dyspnea     Updated Medication List Outpatient Encounter Prescriptions as of 04/29/2015  Medication Sig  . acetaminophen (TYLENOL) 500 MG tablet Take 2 tablets by mouth every 4 (four) hours as needed for mild pain.   Marland Kitchen albuterol (PROVENTIL HFA) 108 (90 BASE) MCG/ACT inhaler Inhale 2 puffs into the lungs every 6 (six) hours as needed for wheezing or shortness of breath.   . ALPRAZolam (XANAX) 0.25 MG tablet Take 0.25 mg by mouth 2 (two) times daily as needed for anxiety.   . budesonide-formoterol (SYMBICORT) 160-4.5 MCG/ACT inhaler Inhale 1 puff into the lungs 2 (two) times daily.  .  citalopram (CELEXA) 10 MG tablet Take 10 mg by mouth daily.  . fluticasone (FLONASE) 50 MCG/ACT nasal spray Place 2 sprays into both nostrils daily as needed for rhinitis.   Marland Kitchen gabapentin (NEURONTIN) 100 MG capsule Take 200 mg by mouth 3 (three) times daily.   . hydrochlorothiazide (HYDRODIURIL) 25 MG tablet Take 25 mg by mouth daily.  . hyoscyamine (LEVBID) 0.375 MG 12 hr tablet Take 0.375 mg by mouth 2 (two) times daily as needed for cramping.  . meclizine (ANTIVERT) 25 MG tablet Take 1 tablet by mouth 2 (two) times daily as needed for dizziness.   . mirtazapine (REMERON) 30 MG tablet Take 30 mg by mouth at bedtime.  . pantoprazole (PROTONIX) 40 MG tablet Take 40 mg by mouth 2 (two) times daily.  . traZODone (DESYREL) 150 MG tablet Take 150 mg by mouth at bedtime.  Marland Kitchen zolpidem (AMBIEN) 5 MG tablet Take 5 mg by mouth at bedtime as needed for sleep.  . BELSOMRA 10 MG TABS Take 10 mg by mouth at bedtime as needed.  . [DISCONTINUED] losartan (COZAAR) 50 MG tablet Take 1 tablet by mouth daily.  . [DISCONTINUED] pravastatin (PRAVACHOL) 20 MG tablet Take 20 mg by mouth at bedtime as needed.   . [DISCONTINUED] varenicline (CHANTIX) 0.5 MG tablet Take 1 tablet (0.5 mg total) by mouth 2 (two) times daily. (Patient not taking: Reported on 04/24/2015)   No facility-administered encounter medications on file as of 04/29/2015.

## 2015-04-29 NOTE — Addendum Note (Signed)
Addended by: Maury Dus L on: 04/29/2015 10:00 AM   Modules accepted: Orders

## 2015-04-29 NOTE — Assessment & Plan Note (Signed)
This is a new problem associated with an abnormal chest x-ray. We reviewed the images of the chest x-ray in clinic and there did appear to be a small wedge-shaped area in the periphery of the right lung. I agree with the management strategy up until this point which included VQ scan which was felt to be low probability for pulmonary embolism. Considering the lengthy smoking history she's had it's going to be important to rule out malignancy.  That said, the most likely scenario here would be an acute infection, recently she had an episode of bronchitis which is the most common cause of hemoptysis should why.  Plan: Treat empirically for a lower respiratory tract infection with 100 mg of doxycycline twice a day for a week CT scan today If the CT scan shows an abnormality worrisome for malignancy then we will see her back tomorrow If the CT scan does not show evidence of malignancy then we will see her back in one month and we will plan on pulmonary function testing at that time

## 2015-05-11 DIAGNOSIS — R1013 Epigastric pain: Secondary | ICD-10-CM | POA: Diagnosis not present

## 2015-05-11 DIAGNOSIS — D369 Benign neoplasm, unspecified site: Secondary | ICD-10-CM | POA: Diagnosis not present

## 2015-05-11 DIAGNOSIS — K2270 Barrett's esophagus without dysplasia: Secondary | ICD-10-CM | POA: Diagnosis not present

## 2015-05-29 ENCOUNTER — Ambulatory Visit (INDEPENDENT_AMBULATORY_CARE_PROVIDER_SITE_OTHER): Payer: Medicare Other | Admitting: Surgery

## 2015-05-29 ENCOUNTER — Encounter: Payer: Self-pay | Admitting: Surgery

## 2015-05-29 VITALS — BP 130/84 | HR 89 | Temp 98.7°F | Ht 64.0 in | Wt 166.6 lb

## 2015-05-29 DIAGNOSIS — R1030 Lower abdominal pain, unspecified: Secondary | ICD-10-CM

## 2015-05-29 DIAGNOSIS — K635 Polyp of colon: Secondary | ICD-10-CM

## 2015-05-29 NOTE — H&P (Signed)
CC: Unresectable sessile serrated adenoma of appendiceal orifice HPI: Crystal White is a pleasant 74 yo F who presents for follow up from colonoscopy where she underwent polyp biopsy for an appendiceal orifice polyp.  Pathology as above.  Otherwise doing well.  Tolerating diet.  Having good BM.  No BRBPR.  No fevers/chills, night sweats, shortness of breath, cough, chest pain, nausea/vomiting, diarrhea/constipation, dysuria/hematuria.  Active Ambulatory Problems    Diagnosis Date Noted  . COPD (chronic obstructive pulmonary disease) 02/13/2012  . Tobacco abuse 02/13/2012  . Sinus congestion 02/13/2012  . Abdominal pain, right lower quadrant 08/22/2005  . Sprain of ankle 04/14/2015  . Anxiety 04/14/2015  . Acute appendicitis with peritoneal abscess 08/22/2005  . Barrett's esophagus 04/14/2015  . Acute exacerbation of chronic obstructive airways disease 04/14/2015  . Bursitis 04/14/2015  . Cellulitis 04/14/2015  . CAFL (chronic airflow limitation) 02/13/2012  . Claudication 04/14/2015  . COPD, moderate 04/14/2015  . Cough 04/14/2015  . Deficiency, 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  . Clinical depression 04/14/2015  . Dizziness 04/14/2015  . Dyslipidemia 04/14/2015  . Esophagitis, reflux 08/28/2006  . Essential (primary) hypertension 04/14/2015  . Flank pain 04/14/2015  . H/O suicide attempt 04/14/2015  . Dysphonia 04/14/2015  . Hypercholesteremia 04/14/2015  . Cannot sleep 04/14/2015  . Malaise and fatigue 04/14/2015  . Mild cognitive impairment 04/14/2015  . Cramp in muscle 04/14/2015  . Muscle ache 04/14/2015  . Neuropathy 04/14/2015  . Adiposity 04/14/2015  . Artificial cardiac pacemaker 10/08/2014  . 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 Apnea, sleep 10/08/2014  . Candida infection of mouth 04/14/2015  . Abnormal loss of weight 04/14/2015  . Hemoptysis 04/29/2015  . Colon polyp 05/29/2015   Resolved Ambulatory Problems    Diagnosis Date Noted  . No Resolved Ambulatory  Problems   Past Medical History  Diagnosis Date  . Hypertension   . Hyperlipidemia   . OSA (obstructive sleep apnea)   . Presence of permanent cardiac pacemaker   . GERD (gastroesophageal reflux disease)   . Stomach ulcer    History   Social History  . Marital Status: Widowed    Spouse Name: widowed  . Number of Children: Y  . Years of Education: N/A   Occupational History  . retired Personal assistant    Social History Main Topics  . Smoking status: Current Every Day Smoker -- 1.00 packs/day for 55 years    Types: Cigarettes  . Smokeless tobacco: Never Used  . Alcohol Use: No  . Drug Use: No  . Sexual Activity: Not on file   Other Topics Concern  . Not on file   Social History Narrative   Pt was adopted.    Family History  Problem Relation Age of Onset  . Adopted: Yes  . Family history unknown: Yes      Medication List       This list is accurate as of: 05/29/15  1:14 PM.  Always use your most recent med list.               acetaminophen 500 MG tablet  Commonly known as:  TYLENOL  Take 2 tablets by mouth every 4 (four) hours as needed for mild pain.     ALPRAZolam 0.25 MG tablet  Commonly known as:  XANAX  Take 0.25 mg by mouth 2 (two) times daily as needed for anxiety.     BELSOMRA 10 MG Tabs  Generic drug:  Suvorexant  Take 10 mg by mouth at bedtime as needed.  budesonide-formoterol 160-4.5 MCG/ACT inhaler  Commonly known as:  SYMBICORT  Inhale 1 puff into the lungs 2 (two) times daily.     citalopram 10 MG tablet  Commonly known as:  CELEXA  Take 10 mg by mouth daily.     fluticasone 50 MCG/ACT nasal spray  Commonly known as:  FLONASE  Place 2 sprays into both nostrils daily as needed for rhinitis.     gabapentin 100 MG capsule  Commonly known as:  NEURONTIN  Take 200 mg by mouth 3 (three) times daily.     hydrochlorothiazide 25 MG tablet  Commonly known as:  HYDRODIURIL  Take 25 mg by mouth daily.     hyoscyamine 0.375 MG 12 hr tablet   Commonly known as:  LEVBID  Take 0.375 mg by mouth 2 (two) times daily as needed for cramping.     meclizine 25 MG tablet  Commonly known as:  ANTIVERT  Take 1 tablet by mouth 2 (two) times daily as needed for dizziness.     mirtazapine 30 MG tablet  Commonly known as:  REMERON  Take 30 mg by mouth at bedtime.     pantoprazole 40 MG tablet  Commonly known as:  PROTONIX  Take 40 mg by mouth 2 (two) times daily.     PROVENTIL HFA 108 (90 BASE) MCG/ACT inhaler  Generic drug:  albuterol  Inhale 2 puffs into the lungs every 6 (six) hours as needed for wheezing or shortness of breath.     traZODone 150 MG tablet  Commonly known as:  DESYREL  Take 150 mg by mouth at bedtime.     zolpidem 5 MG tablet  Commonly known as:  AMBIEN  Take 5 mg by mouth at bedtime as needed for sleep.       Blood pressure 130/84, pulse 89, temperature 98.7 F (37.1 C), temperature source Oral, height 5\' 4"  (1.626 m), weight 166 lb 9.6 oz (75.569 kg). GEN: NAD/A&Ox3 FACE: no obvious facial trauma, normal external nose, normal external ears EYES: no scleral icterus, no conjunctivitis HEAD: normocephalic atraumatic CV: RRR, no MRG RESP: moving air well, lungs clear ABD: soft, nontender, nondistended EXT: moving all ext well, strength 5/5 NEURO: cnII-XII grossly intact, sensation intact all 4 ext  [name] 74 yo F who presents with unresectable, unsurveillable colon polyp.  Have discussed with Dr. Gustavo Lah who says that due to position polyp cannot be reliably surveilled or resected.  Will plan on partial cecectomy vs ileocecectomy in OR. I have discussed surgery with patient, its risks and benefits and she would like to proceed.

## 2015-05-29 NOTE — Addendum Note (Signed)
Addended by: Marlyce Huge A on: 05/29/2015 01:20 PM   Modules accepted: Orders

## 2015-05-29 NOTE — Patient Instructions (Signed)
You will need to have a Urinalysis drawn as soon as possible. Our office will call with the results. If you have not heard from our office in 5 days from time of drop-off of specimen, please call us.  We will call you regarding your request to have this polyp removed surgically.  Please call with any questions or concerns.

## 2015-05-29 NOTE — Progress Notes (Signed)
CC: Unresectable sessile serrated adenoma of appendiceal orifice HPI: [name] is a pleasant 74 yo F who presents for follow up from colonoscopy where she underwent polyp biopsy for an appendiceal orifice polyp.  Pathology as above.  Otherwise doing well.  Tolerating diet.  Having good BM.  No BRBPR.  No fevers/chills, night sweats, shortness of breath, cough, chest pain, nausea/vomiting, diarrhea/constipation, dysuria/hematuria.  Active Ambulatory Problems    Diagnosis Date Noted  . COPD (chronic obstructive pulmonary disease) 02/13/2012  . Tobacco abuse 02/13/2012  . Sinus congestion 02/13/2012  . Abdominal pain, right lower quadrant 08/22/2005  . Sprain of ankle 04/14/2015  . Anxiety 04/14/2015  . Acute appendicitis with peritoneal abscess 08/22/2005  . Barrett's esophagus 04/14/2015  . Acute exacerbation of chronic obstructive airways disease 04/14/2015  . Bursitis 04/14/2015  . Cellulitis 04/14/2015  . CAFL (chronic airflow limitation) 02/13/2012  . Claudication 04/14/2015  . COPD, moderate 04/14/2015  . Cough 04/14/2015  . Deficiency, 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  . Clinical depression 04/14/2015  . Dizziness 04/14/2015  . Dyslipidemia 04/14/2015  . Esophagitis, reflux 08/28/2006  . Essential (primary) hypertension 04/14/2015  . Flank pain 04/14/2015  . H/O suicide attempt 04/14/2015  . Dysphonia 04/14/2015  . Hypercholesteremia 04/14/2015  . Cannot sleep 04/14/2015  . Malaise and fatigue 04/14/2015  . Mild cognitive impairment 04/14/2015  . Cramp in muscle 04/14/2015  . Muscle ache 04/14/2015  . Neuropathy 04/14/2015  . Adiposity 04/14/2015  . Artificial cardiac pacemaker 10/08/2014  . 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 Apnea, sleep 10/08/2014  . Candida infection of mouth 04/14/2015  . Abnormal loss of weight 04/14/2015  . Hemoptysis 04/29/2015  . Colon polyp 05/29/2015   Resolved Ambulatory Problems    Diagnosis Date Noted  . No Resolved Ambulatory  Problems   Past Medical History  Diagnosis Date  . Hypertension   . Hyperlipidemia   . OSA (obstructive sleep apnea)   . Presence of permanent cardiac pacemaker   . GERD (gastroesophageal reflux disease)   . Stomach ulcer    History   Social History  . Marital Status: Widowed    Spouse Name: widowed  . Number of Children: Y  . Years of Education: N/A   Occupational History  . retired Personal assistant    Social History Main Topics  . Smoking status: Current Every Day Smoker -- 1.00 packs/day for 55 years    Types: Cigarettes  . Smokeless tobacco: Never Used  . Alcohol Use: No  . Drug Use: No  . Sexual Activity: Not on file   Other Topics Concern  . Not on file   Social History Narrative   Pt was adopted.    Family History  Problem Relation Age of Onset  . Adopted: Yes  . Family history unknown: Yes      Medication List       This list is accurate as of: 05/29/15  1:14 PM.  Always use your most recent med list.               acetaminophen 500 MG tablet  Commonly known as:  TYLENOL  Take 2 tablets by mouth every 4 (four) hours as needed for mild pain.     ALPRAZolam 0.25 MG tablet  Commonly known as:  XANAX  Take 0.25 mg by mouth 2 (two) times daily as needed for anxiety.     BELSOMRA 10 MG Tabs  Generic drug:  Suvorexant  Take 10 mg by mouth at bedtime as needed.  budesonide-formoterol 160-4.5 MCG/ACT inhaler  Commonly known as:  SYMBICORT  Inhale 1 puff into the lungs 2 (two) times daily.     citalopram 10 MG tablet  Commonly known as:  CELEXA  Take 10 mg by mouth daily.     fluticasone 50 MCG/ACT nasal spray  Commonly known as:  FLONASE  Place 2 sprays into both nostrils daily as needed for rhinitis.     gabapentin 100 MG capsule  Commonly known as:  NEURONTIN  Take 200 mg by mouth 3 (three) times daily.     hydrochlorothiazide 25 MG tablet  Commonly known as:  HYDRODIURIL  Take 25 mg by mouth daily.     hyoscyamine 0.375 MG 12 hr tablet   Commonly known as:  LEVBID  Take 0.375 mg by mouth 2 (two) times daily as needed for cramping.     meclizine 25 MG tablet  Commonly known as:  ANTIVERT  Take 1 tablet by mouth 2 (two) times daily as needed for dizziness.     mirtazapine 30 MG tablet  Commonly known as:  REMERON  Take 30 mg by mouth at bedtime.     pantoprazole 40 MG tablet  Commonly known as:  PROTONIX  Take 40 mg by mouth 2 (two) times daily.     PROVENTIL HFA 108 (90 BASE) MCG/ACT inhaler  Generic drug:  albuterol  Inhale 2 puffs into the lungs every 6 (six) hours as needed for wheezing or shortness of breath.     traZODone 150 MG tablet  Commonly known as:  DESYREL  Take 150 mg by mouth at bedtime.     zolpidem 5 MG tablet  Commonly known as:  AMBIEN  Take 5 mg by mouth at bedtime as needed for sleep.       Blood pressure 130/84, pulse 89, temperature 98.7 F (37.1 C), temperature source Oral, height 5\' 4"  (1.626 m), weight 166 lb 9.6 oz (75.569 kg). GEN: NAD/A&Ox3 FACE: no obvious facial trauma, normal external nose, normal external ears EYES: no scleral icterus, no conjunctivitis HEAD: normocephalic atraumatic CV: RRR, no MRG RESP: moving air well, lungs clear ABD: soft, nontender, nondistended EXT: moving all ext well, strength 5/5 NEURO: cnII-XII grossly intact, sensation intact all 4 ext  [name] 74 yo F who presents with unresectable, unsurveillable colon polyp.  Have discussed with Dr. Gustavo Lah who says that due to position polyp cannot be reliably surveilled or resected.  Will plan on partial cecectomy vs ileocecectomy in OR. I have discussed surgery with patient, its risks and benefits and she would like to proceed.

## 2015-05-30 LAB — URINALYSIS
BILIRUBIN UA: NEGATIVE
GLUCOSE, UA: NEGATIVE
Ketones, UA: NEGATIVE
LEUKOCYTES UA: NEGATIVE
NITRITE UA: NEGATIVE
PH UA: 6 (ref 5.0–7.5)
Protein, UA: NEGATIVE
RBC, UA: NEGATIVE
SPEC GRAV UA: 1.014 (ref 1.005–1.030)
UUROB: 0.2 mg/dL (ref 0.2–1.0)

## 2015-06-02 ENCOUNTER — Encounter: Payer: Self-pay | Admitting: Pulmonary Disease

## 2015-06-02 ENCOUNTER — Ambulatory Visit (INDEPENDENT_AMBULATORY_CARE_PROVIDER_SITE_OTHER): Payer: Medicare Other | Admitting: Pulmonary Disease

## 2015-06-02 ENCOUNTER — Telehealth: Payer: Self-pay | Admitting: Pulmonary Disease

## 2015-06-02 VITALS — BP 134/72 | HR 90 | Ht 64.5 in | Wt 167.0 lb

## 2015-06-02 DIAGNOSIS — J438 Other emphysema: Secondary | ICD-10-CM

## 2015-06-02 DIAGNOSIS — R042 Hemoptysis: Secondary | ICD-10-CM | POA: Diagnosis not present

## 2015-06-02 DIAGNOSIS — R918 Other nonspecific abnormal finding of lung field: Secondary | ICD-10-CM | POA: Diagnosis not present

## 2015-06-02 NOTE — Assessment & Plan Note (Signed)
She has moderate airflow obstruction and Gold grade B COPD. She is currently not in exacerbation.  She has upcoming surgery which potentially would involve a hemicolectomy. I explained to her today that she is at low risk and I feel she should proceed with surgery.  Plan: Continue inhaled therapies now as well as on the day of surgery After surgery get out of bed as soon as possible Use incentive spirometry after surgery

## 2015-06-02 NOTE — Telephone Encounter (Signed)
I was still getting a fast busy signal when trying to call pt after verifying the phone number.  Spoke with pt's son and gave him my direct number.  Pt called & I gave her appt info for CT 08/04/15 arrival @10 :30 am [facility].

## 2015-06-02 NOTE — Assessment & Plan Note (Signed)
This problem has resolved. However, it was associated with an abnormal chest x-ray and CT scan which I believe was due to an infection.  Plan We will continue to monitor clinically and repeat CTs as detailed below

## 2015-06-02 NOTE — Progress Notes (Signed)
Subjective:    Patient ID: Crystal White, female    DOB: September 30, 1941, 74 y.o.   MRN: [id number]  Synopsis: Crystal White first saw the [facility] in 01/2012 for COPD.  Her FEV1 was 1.89 L (52%) predicted and her mMRC score was 2.  She smokes 1/2 ppd and was still smoking in May 2013.  HPI  Chief Complaint  Patient presents with  . Follow-up    review CT chest.  pt states prod cough has resolved, does c/o sob with exertion.     Crystal White has not coughed up any blood since she saw me last.  No trouble for the most part apart from her normal shortness of breath.  No chest pain or weight loss. Unfortunately she has been diagnosed with a colonic malignancy based on a colonoscopy from about a month ago. She is to have surgery for this in. It sounds as if they have plans for a hemicolectomy based on her description.  Past Medical History  Diagnosis Date  . Hypertension   . COPD (chronic obstructive pulmonary disease)   . Hyperlipidemia   . OSA (obstructive sleep apnea)   . Presence of permanent cardiac pacemaker     2014- [facility]  . GERD (gastroesophageal reflux disease)   . Stomach ulcer       Review of Systems  Constitutional: Negative for fever, appetite change and unexpected weight change.  HENT: Negative for congestion, postnasal drip, rhinorrhea, sinus pressure, sneezing and trouble swallowing.   Respiratory: Positive for shortness of breath. Negative for cough, chest tightness and wheezing.        Objective:   Physical Exam  Filed Vitals:   06/02/15 0921  BP: 134/72  Pulse: 90  Height: 5' 4.5" (1.638 m)  Weight: 167 lb (75.751 kg)  SpO2: 94%   Gen: well appearing HENT: OP clear, TM's clear, neck supple PULM: CTA B, normal percussion CV: RRR, no mgr, trace edema GI: BS+, soft, nontender Derm: no cyanosis or rash Psyche: normal mood and affect    2010 Spirometry: Ratio not reported but flow volume loop shows clear obstruction; FEV1 1.89 L or 52%  predicted 04/29/2015 CT chest > images personally reviewed showing emphysema bilaterally and a small right lower lobe patch of consolidation with surrounding groundglass in the periphery, multiple pulmonary nodules also noted, largest of which was 7 mm in size in the left lower lobe.      Assessment & Plan:   Hemoptysis This problem has resolved. However, it was associated with an abnormal chest x-ray and CT scan which I believe was due to an infection.  Plan We will continue to monitor clinically and repeat CTs as detailed below  COPD (chronic obstructive pulmonary disease) She has moderate airflow obstruction and Gold grade B COPD. She is currently not in exacerbation.  She has upcoming surgery which potentially would involve a hemicolectomy. I explained to her today that she is at low risk and I feel she should proceed with surgery.  Plan: Continue inhaled therapies now as well as on the day of surgery After surgery get out of bed as soon as possible Use incentive spirometry after surgery   Multiple pulmonary nodules Multiple pulmonary nodules were seen on the recent CT scan of her chest. Considering her smoking history and the size of the largest lesion (7 mm), she needs to have a repeat CT chest in 3 months.  Plan: Repeat CT chest 3 months    Updated Medication List Outpatient  Encounter Prescriptions as of 06/02/2015  Medication Sig  . acetaminophen (TYLENOL) 500 MG tablet Take 2 tablets by mouth every 4 (four) hours as needed for mild pain.   Marland Kitchen albuterol (PROVENTIL HFA) 108 (90 BASE) MCG/ACT inhaler Inhale 2 puffs into the lungs every 6 (six) hours as needed for wheezing or shortness of breath.   . ALPRAZolam (XANAX) 0.25 MG tablet Take 0.25 mg by mouth 2 (two) times daily as needed for anxiety.   . BELSOMRA 10 MG TABS Take 10 mg by mouth at bedtime as needed.  . budesonide-formoterol (SYMBICORT) 160-4.5 MCG/ACT inhaler Inhale 1 puff into the lungs 2 (two) times daily.  .  citalopram (CELEXA) 10 MG tablet Take 10 mg by mouth daily.  . fluticasone (FLONASE) 50 MCG/ACT nasal spray Place 2 sprays into both nostrils daily as needed for rhinitis.   Marland Kitchen gabapentin (NEURONTIN) 100 MG capsule Take 200 mg by mouth 3 (three) times daily.   . hydrochlorothiazide (HYDRODIURIL) 25 MG tablet Take 25 mg by mouth daily.  . hyoscyamine (LEVBID) 0.375 MG 12 hr tablet Take 0.375 mg by mouth 2 (two) times daily as needed for cramping.  . meclizine (ANTIVERT) 25 MG tablet Take 1 tablet by mouth 2 (two) times daily as needed for dizziness.   . mirtazapine (REMERON) 30 MG tablet Take 30 mg by mouth at bedtime.  . pantoprazole (PROTONIX) 40 MG tablet Take 40 mg by mouth 2 (two) times daily.  . traZODone (DESYREL) 150 MG tablet Take 150 mg by mouth at bedtime.  Marland Kitchen zolpidem (AMBIEN) 5 MG tablet Take 5 mg by mouth at bedtime as needed for sleep.   No facility-administered encounter medications on file as of 06/02/2015.

## 2015-06-02 NOTE — Patient Instructions (Signed)
From my standpoint are okay to have surgery for the recently discovered malignancy Continue taking your inhalers as you're doing We will schedule a CT scan on 07/30/2015 and see you after that

## 2015-06-02 NOTE — Assessment & Plan Note (Signed)
Multiple pulmonary nodules were seen on the recent CT scan of her chest. Considering her smoking history and the size of the largest lesion (7 mm), she needs to have a repeat CT chest in 3 months.  Plan: Repeat CT chest 3 months

## 2015-06-03 ENCOUNTER — Telehealth: Payer: Self-pay | Admitting: Surgery

## 2015-06-03 NOTE — Telephone Encounter (Signed)
Pt advised of pre op date/time and sx date. Sx: 06/22/15 with Dr Maren Beach hemicolectomy Pre op: office pre op--06/15/15 @ 9:15am.

## 2015-06-15 ENCOUNTER — Encounter
Admission: RE | Admit: 2015-06-15 | Discharge: 2015-06-15 | Disposition: A | Discharge status: Home / Self Care | Payer: Medicare Other | Source: Ambulatory Visit | Attending: Surgery | Admitting: Surgery

## 2015-06-15 ENCOUNTER — Telehealth: Payer: Self-pay | Admitting: Surgery

## 2015-06-15 DIAGNOSIS — G3184 Mild cognitive impairment, so stated: Secondary | ICD-10-CM | POA: Insufficient documentation

## 2015-06-15 DIAGNOSIS — Z950 Presence of cardiac pacemaker: Secondary | ICD-10-CM | POA: Diagnosis not present

## 2015-06-15 DIAGNOSIS — Z01812 Encounter for preprocedural laboratory examination: Secondary | ICD-10-CM | POA: Insufficient documentation

## 2015-06-15 DIAGNOSIS — K219 Gastro-esophageal reflux disease without esophagitis: Secondary | ICD-10-CM | POA: Insufficient documentation

## 2015-06-15 DIAGNOSIS — K635 Polyp of colon: Secondary | ICD-10-CM | POA: Diagnosis not present

## 2015-06-15 DIAGNOSIS — E785 Hyperlipidemia, unspecified: Secondary | ICD-10-CM | POA: Diagnosis not present

## 2015-06-15 DIAGNOSIS — G629 Polyneuropathy, unspecified: Secondary | ICD-10-CM | POA: Insufficient documentation

## 2015-06-15 DIAGNOSIS — R918 Other nonspecific abnormal finding of lung field: Secondary | ICD-10-CM | POA: Diagnosis not present

## 2015-06-15 DIAGNOSIS — J449 Chronic obstructive pulmonary disease, unspecified: Secondary | ICD-10-CM | POA: Diagnosis not present

## 2015-06-15 DIAGNOSIS — R05 Cough: Secondary | ICD-10-CM | POA: Insufficient documentation

## 2015-06-15 DIAGNOSIS — Z01810 Encounter for preprocedural cardiovascular examination: Secondary | ICD-10-CM | POA: Diagnosis not present

## 2015-06-15 DIAGNOSIS — R634 Abnormal weight loss: Secondary | ICD-10-CM | POA: Insufficient documentation

## 2015-06-15 DIAGNOSIS — I10 Essential (primary) hypertension: Secondary | ICD-10-CM | POA: Insufficient documentation

## 2015-06-15 DIAGNOSIS — G4733 Obstructive sleep apnea (adult) (pediatric): Secondary | ICD-10-CM | POA: Insufficient documentation

## 2015-06-15 DIAGNOSIS — Z01818 Encounter for other preprocedural examination: Secondary | ICD-10-CM

## 2015-06-15 LAB — COMPREHENSIVE METABOLIC PANEL
ALT: 11 U/L — ABNORMAL LOW (ref 14–54)
ANION GAP: 10 (ref 5–15)
AST: 20 U/L (ref 15–41)
Albumin: 3.9 g/dL (ref 3.5–5.0)
Alkaline Phosphatase: 77 U/L (ref 38–126)
BILIRUBIN TOTAL: 0.5 mg/dL (ref 0.3–1.2)
BUN: 21 mg/dL — AB (ref 6–20)
CALCIUM: 8.9 mg/dL (ref 8.9–10.3)
CO2: 27 mmol/L (ref 22–32)
Chloride: 102 mmol/L (ref 101–111)
Creatinine, Ser: 1.23 mg/dL — ABNORMAL HIGH (ref 0.44–1.00)
GFR calc non Af Amer: 43 mL/min — ABNORMAL LOW (ref >60)
GFR, EST AFRICAN AMERICAN: 50 mL/min — AB (ref >60)
GLUCOSE: 89 mg/dL (ref 65–99)
Potassium: 3.7 mmol/L (ref 3.5–5.1)
SODIUM: 139 mmol/L (ref 135–145)
Total Protein: 7.3 g/dL (ref 6.5–8.1)

## 2015-06-15 LAB — CBC WITH DIFFERENTIAL/PLATELET
BASOS ABS: 0.1 10*3/uL (ref 0–0.1)
Basophils Relative: 1 %
Eosinophils Absolute: 0.1 10*3/uL (ref 0–0.7)
Eosinophils Relative: 1 %
HEMATOCRIT: 46.8 % (ref 35.0–47.0)
HEMOGLOBIN: 15.3 g/dL (ref 12.0–16.0)
LYMPHS PCT: 11 %
Lymphs Abs: 1 10*3/uL (ref 1.0–3.6)
MCH: 28.6 pg (ref 26.0–34.0)
MCHC: 32.6 g/dL (ref 32.0–36.0)
MCV: 87.6 fL (ref 80.0–100.0)
MONOS PCT: 6 %
Monocytes Absolute: 0.5 10*3/uL (ref 0.2–0.9)
NEUTROS PCT: 81 %
Neutro Abs: 7.5 10*3/uL — ABNORMAL HIGH (ref 1.4–6.5)
Platelets: 228 10*3/uL (ref 150–440)
RBC: 5.35 MIL/uL — AB (ref 3.80–5.20)
RDW: 15.6 % — ABNORMAL HIGH (ref 11.5–14.5)
WBC: 9.2 10*3/uL (ref 3.6–11.0)

## 2015-06-15 MED ORDER — BISACODYL 5 MG PO TBEC: 20.0000 mg | DELAYED_RELEASE_TABLET | Freq: Once | ORAL | Status: DC

## 2015-06-15 MED ORDER — POLYETHYLENE GLYCOL 3350 17 GM/SCOOP PO POWD: 1.0000 | Freq: Once | ORAL | Status: DC

## 2015-06-15 NOTE — Patient Instructions (Signed)
  Your procedure is scheduled on: Monday 06/22/2015 Report to Day Surgery. 2ND FLOOR MEDICAL MALL ENTRANCE To find out your arrival time please call [phone] between 1PM - 3PM on Friday 06/19/2015.  Remember: Instructions that are not followed completely may result in serious medical risk, up to and including death, or upon the discretion of your surgeon and anesthesiologist your surgery may need to be rescheduled.    __X__ 1. Do not eat food or drink liquids after midnight. No gum chewing or hard candies.     __X__ 2. No Alcohol for 24 hours before or after surgery.   ____ 3. Bring all medications with you on the day of surgery if instructed.    __X__ 4. Notify your doctor if there is any change in your medical condition     (cold, fever, infections).     Do not wear jewelry, make-up, hairpins, clips or nail polish.  Do not wear lotions, powders, or perfumes.   Do not shave 48 hours prior to surgery. Men may shave face and neck.  Do not bring valuables to the hospital.    [facility] is not responsible for any belongings or valuables.               Contacts, dentures or bridgework may not be worn into surgery.  Leave your suitcase in the car. After surgery it may be brought to your room.  For patients admitted to the hospital, discharge time is determined by your                treatment team.   Patients discharged the day of surgery will not be allowed to drive home.   Please read over the following fact sheets that you were given:   Surgical Site Infection Prevention   __X__ Take these medicines the morning of surgery with A SIP OF WATER:    1. GABAPENTIN  2. PANTOPRAZOLE  3.   4.  5.  6.  ____ Fleet Enema (as directed)   __X__ Use CHG Soap as directed  __X__ Use inhalers on the day of surgery AND BRING WITH YOU TO HOSPITAL (ALBUTEROL AND SYMBICORT)  ____ Stop metformin 2 days prior to surgery    ____ Take 1/2 of usual insulin dose the night before surgery and  none on the morning of surgery.   ____ Stop Coumadin/Plavix/aspirin on   ____ Stop Anti-inflammatories on    ____ Stop supplements until after surgery.    __X__ Bring C-Pap to the hospital.

## 2015-06-15 NOTE — Telephone Encounter (Signed)
Spoke with patient at this time. She would like to come by [facility] office and review this with a nurse. Explained that this can be done any day this week and that she would need to start her prep on the 22nd so this needs to be prior to this date.  Verbalizes understanding of this and will be by tomorrow to review with a nurse.  Instructions printed and medications sent to [facility] in [place] at this time.  Will update when patient stops in to review with nurse.

## 2015-06-15 NOTE — Telephone Encounter (Signed)
Patient has questions about surgery and prep kit for surgery. Please contact pt to answer questions.

## 2015-06-17 ENCOUNTER — Telehealth: Payer: Self-pay

## 2015-06-17 NOTE — Telephone Encounter (Signed)
Patient came in to office to review Surgical Prep. Reviewed with patient. Answered all questions. Explained that all medications were called in to pharmacy. Instruction sheet given to patient.

## 2015-06-21 MED ORDER — SODIUM CHLORIDE 0.9 % IV SOLN
1.0000 g | INTRAVENOUS | Status: AC
  Administered 2015-06-22: 1 g via INTRAVENOUS
  Filled 2015-06-21: qty 1

## 2015-06-22 ENCOUNTER — Encounter
Admission: RE | Disposition: A | Discharge status: Home / Self Care | Payer: Self-pay | Source: Ambulatory Visit | Attending: Surgery

## 2015-06-22 ENCOUNTER — Inpatient Hospital Stay: Payer: Medicare Other | Admitting: Anesthesiology

## 2015-06-22 ENCOUNTER — Inpatient Hospital Stay
Admission: RE | Admit: 2015-06-22 | Discharge: 2015-06-26 | DRG: 331 | Disposition: A | Discharge status: Home / Self Care | Payer: Medicare Other | Source: Ambulatory Visit | Attending: Surgery | Admitting: Surgery

## 2015-06-22 ENCOUNTER — Encounter: Payer: Self-pay | Admitting: *Deleted

## 2015-06-22 DIAGNOSIS — Z950 Presence of cardiac pacemaker: Secondary | ICD-10-CM | POA: Diagnosis not present

## 2015-06-22 DIAGNOSIS — K279 Peptic ulcer, site unspecified, unspecified as acute or chronic, without hemorrhage or perforation: Secondary | ICD-10-CM | POA: Diagnosis present

## 2015-06-22 DIAGNOSIS — G3184 Mild cognitive impairment, so stated: Secondary | ICD-10-CM | POA: Diagnosis present

## 2015-06-22 DIAGNOSIS — K2270 Barrett's esophagus without dysplasia: Secondary | ICD-10-CM | POA: Diagnosis present

## 2015-06-22 DIAGNOSIS — K219 Gastro-esophageal reflux disease without esophagitis: Secondary | ICD-10-CM | POA: Diagnosis present

## 2015-06-22 DIAGNOSIS — I10 Essential (primary) hypertension: Secondary | ICD-10-CM | POA: Diagnosis present

## 2015-06-22 DIAGNOSIS — J449 Chronic obstructive pulmonary disease, unspecified: Secondary | ICD-10-CM | POA: Diagnosis present

## 2015-06-22 DIAGNOSIS — Z79899 Other long term (current) drug therapy: Secondary | ICD-10-CM

## 2015-06-22 DIAGNOSIS — Z7951 Long term (current) use of inhaled steroids: Secondary | ICD-10-CM

## 2015-06-22 DIAGNOSIS — G629 Polyneuropathy, unspecified: Secondary | ICD-10-CM | POA: Diagnosis present

## 2015-06-22 DIAGNOSIS — G4733 Obstructive sleep apnea (adult) (pediatric): Secondary | ICD-10-CM | POA: Diagnosis not present

## 2015-06-22 DIAGNOSIS — D120 Benign neoplasm of cecum: Principal | ICD-10-CM | POA: Diagnosis present

## 2015-06-22 DIAGNOSIS — F329 Major depressive disorder, single episode, unspecified: Secondary | ICD-10-CM | POA: Diagnosis present

## 2015-06-22 DIAGNOSIS — J439 Emphysema, unspecified: Secondary | ICD-10-CM

## 2015-06-22 DIAGNOSIS — J9811 Atelectasis: Secondary | ICD-10-CM | POA: Diagnosis not present

## 2015-06-22 DIAGNOSIS — E780 Pure hypercholesterolemia: Secondary | ICD-10-CM | POA: Diagnosis present

## 2015-06-22 DIAGNOSIS — F419 Anxiety disorder, unspecified: Secondary | ICD-10-CM | POA: Diagnosis present

## 2015-06-22 DIAGNOSIS — F17210 Nicotine dependence, cigarettes, uncomplicated: Secondary | ICD-10-CM | POA: Diagnosis present

## 2015-06-22 DIAGNOSIS — E785 Hyperlipidemia, unspecified: Secondary | ICD-10-CM | POA: Diagnosis present

## 2015-06-22 DIAGNOSIS — K635 Polyp of colon: Secondary | ICD-10-CM | POA: Diagnosis present

## 2015-06-22 DIAGNOSIS — R079 Chest pain, unspecified: Secondary | ICD-10-CM | POA: Diagnosis not present

## 2015-06-22 HISTORY — PX: LAPAROSCOPIC RIGHT HEMI COLECTOMY: SHX5926

## 2015-06-22 LAB — CREATININE, SERUM
CREATININE: 1.39 mg/dL — AB (ref 0.44–1.00)
GFR calc Af Amer: 43 mL/min — ABNORMAL LOW (ref >60)
GFR calc non Af Amer: 37 mL/min — ABNORMAL LOW (ref >60)

## 2015-06-22 LAB — CBC
HEMATOCRIT: 43.5 % (ref 35.0–47.0)
HEMOGLOBIN: 14.1 g/dL (ref 12.0–16.0)
MCH: 28.2 pg (ref 26.0–34.0)
MCHC: 32.4 g/dL (ref 32.0–36.0)
MCV: 87.1 fL (ref 80.0–100.0)
PLATELETS: 224 10*3/uL (ref 150–440)
RBC: 5 MIL/uL (ref 3.80–5.20)
RDW: 15.8 % — AB (ref 11.5–14.5)
WBC: 15.8 10*3/uL — AB (ref 3.6–11.0)

## 2015-06-22 SURGERY — LAPAROSCOPIC RIGHT HEMI COLECTOMY
Anesthesia: General | Laterality: Right

## 2015-06-22 MED ORDER — MIRTAZAPINE 15 MG PO TABS
30.0000 mg | ORAL_TABLET | Freq: Every day | ORAL | Status: DC
Start: 2015-06-22 — End: 2015-06-26
  Administered 2015-06-23 – 2015-06-25 (×3): 30 mg via ORAL
  Filled 2015-06-22 (×3): qty 2

## 2015-06-22 MED ORDER — HYDROMORPHONE HCL 1 MG/ML IJ SOLN: 0.2500 mg | INTRAMUSCULAR | Status: DC | PRN

## 2015-06-22 MED ORDER — NITROGLYCERIN 2 % TD OINT
TOPICAL_OINTMENT | TRANSDERMAL | Status: DC | PRN
  Administered 2015-06-22: 1 [in_us] via TOPICAL

## 2015-06-22 MED ORDER — HYDROCHLOROTHIAZIDE 25 MG PO TABS
25.0000 mg | ORAL_TABLET | Freq: Every day | ORAL | Status: DC
  Administered 2015-06-23 – 2015-06-26 (×4): 25 mg via ORAL
  Filled 2015-06-22 (×4): qty 1

## 2015-06-22 MED ORDER — ONDANSETRON HCL 4 MG/2ML IJ SOLN: 4.0000 mg | Freq: Once | INTRAMUSCULAR | Status: DC | PRN

## 2015-06-22 MED ORDER — MECLIZINE HCL 25 MG PO TABS: 25.0000 mg | ORAL_TABLET | Freq: Two times a day (BID) | ORAL | Status: DC | PRN

## 2015-06-22 MED ORDER — SUGAMMADEX SODIUM 200 MG/2ML IV SOLN
INTRAVENOUS | Status: DC | PRN
  Administered 2015-06-22: 151.6 mg via INTRAVENOUS

## 2015-06-22 MED ORDER — TRAZODONE HCL 50 MG PO TABS
150.0000 mg | ORAL_TABLET | Freq: Every day | ORAL | Status: DC
  Administered 2015-06-23 – 2015-06-25 (×3): 150 mg via ORAL
  Filled 2015-06-22 (×3): qty 1

## 2015-06-22 MED ORDER — EPHEDRINE SULFATE 50 MG/ML IJ SOLN
INTRAMUSCULAR | Status: DC | PRN
  Administered 2015-06-22: 10 mg via INTRAVENOUS
  Administered 2015-06-22: 5 mg via INTRAVENOUS

## 2015-06-22 MED ORDER — PANTOPRAZOLE SODIUM 40 MG IV SOLR
40.0000 mg | Freq: Every day | INTRAVENOUS | Status: DC
  Administered 2015-06-22 – 2015-06-25 (×3): 40 mg via INTRAVENOUS
  Filled 2015-06-22 (×3): qty 40

## 2015-06-22 MED ORDER — BUDESONIDE-FORMOTEROL FUMARATE 160-4.5 MCG/ACT IN AERO
1.0000 | INHALATION_SPRAY | Freq: Two times a day (BID) | RESPIRATORY_TRACT | Status: DC
  Administered 2015-06-22 – 2015-06-26 (×8): 1 via RESPIRATORY_TRACT
  Filled 2015-06-22: qty 6

## 2015-06-22 MED ORDER — LACTATED RINGERS IV SOLN
INTRAVENOUS | Status: DC
  Administered 2015-06-22 (×3): via INTRAVENOUS

## 2015-06-22 MED ORDER — ALBUTEROL SULFATE (2.5 MG/3ML) 0.083% IN NEBU: 2.5000 mg | INHALATION_SOLUTION | Freq: Four times a day (QID) | RESPIRATORY_TRACT | Status: DC | PRN

## 2015-06-22 MED ORDER — ACETAMINOPHEN 650 MG RE SUPP: 650.0000 mg | Freq: Four times a day (QID) | RECTAL | Status: DC | PRN

## 2015-06-22 MED ORDER — ROCURONIUM BROMIDE 100 MG/10ML IV SOLN
INTRAVENOUS | Status: DC | PRN
  Administered 2015-06-22: 35 mg via INTRAVENOUS
  Administered 2015-06-22 (×2): 10 mg via INTRAVENOUS
  Administered 2015-06-22: 5 mg via INTRAVENOUS

## 2015-06-22 MED ORDER — FENTANYL CITRATE (PF) 100 MCG/2ML IJ SOLN
INTRAMUSCULAR | Status: DC | PRN
  Administered 2015-06-22: 50 ug via INTRAVENOUS
  Administered 2015-06-22: 100 ug via INTRAVENOUS
  Administered 2015-06-22: 150 ug via INTRAVENOUS
  Administered 2015-06-22: 50 ug via INTRAVENOUS

## 2015-06-22 MED ORDER — FENTANYL CITRATE (PF) 100 MCG/2ML IJ SOLN
25.0000 ug | INTRAMUSCULAR | Status: DC | PRN
  Administered 2015-06-22 (×4): 25 ug via INTRAVENOUS

## 2015-06-22 MED ORDER — ALBUTEROL SULFATE HFA 108 (90 BASE) MCG/ACT IN AERS: 2.0000 | INHALATION_SPRAY | Freq: Four times a day (QID) | RESPIRATORY_TRACT | Status: DC | PRN

## 2015-06-22 MED ORDER — DEXAMETHASONE SODIUM PHOSPHATE 4 MG/ML IJ SOLN
INTRAMUSCULAR | Status: DC | PRN
  Administered 2015-06-22: 10 mg via INTRAVENOUS

## 2015-06-22 MED ORDER — SODIUM CHLORIDE 0.9 % IV SOLN: 10000.0000 ug | INTRAVENOUS | Status: DC | PRN

## 2015-06-22 MED ORDER — LIDOCAINE HCL 1 % IJ SOLN
INTRAMUSCULAR | Status: DC | PRN
  Administered 2015-06-22: 1 mL

## 2015-06-22 MED ORDER — HYDROMORPHONE HCL 1 MG/ML IJ SOLN
0.5000 mg | INTRAMUSCULAR | Status: DC | PRN
  Administered 2015-06-22 – 2015-06-26 (×14): 1 mg via INTRAVENOUS
  Filled 2015-06-22 (×14): qty 1

## 2015-06-22 MED ORDER — FLUTICASONE PROPIONATE 50 MCG/ACT NA SUSP: 2.0000 | Freq: Every day | NASAL | Status: DC | PRN

## 2015-06-22 MED ORDER — SODIUM CHLORIDE 0.9 % IV SOLN
1.0000 g | INTRAVENOUS | Status: DC | PRN
  Administered 2015-06-22: 1 g via INTRAVENOUS

## 2015-06-22 MED ORDER — HEPARIN SODIUM (PORCINE) 5000 UNIT/ML IJ SOLN
5000.0000 [IU] | Freq: Three times a day (TID) | INTRAMUSCULAR | Status: DC
  Administered 2015-06-22 – 2015-06-25 (×10): 5000 [IU] via SUBCUTANEOUS
  Filled 2015-06-22 (×11): qty 1

## 2015-06-22 MED ORDER — SUCCINYLCHOLINE CHLORIDE 20 MG/ML IJ SOLN
INTRAMUSCULAR | Status: DC | PRN
  Administered 2015-06-22: 100 mg via INTRAVENOUS

## 2015-06-22 MED ORDER — PHENYLEPHRINE HCL 10 MG/ML IJ SOLN
INTRAMUSCULAR | Status: DC | PRN
  Administered 2015-06-22 (×5): 100 ug via INTRAVENOUS

## 2015-06-22 MED ORDER — ONDANSETRON HCL 4 MG/2ML IJ SOLN: 4.0000 mg | Freq: Four times a day (QID) | INTRAMUSCULAR | Status: DC | PRN

## 2015-06-22 MED ORDER — CITALOPRAM HYDROBROMIDE 20 MG PO TABS
10.0000 mg | ORAL_TABLET | Freq: Every day | ORAL | Status: DC
Start: 2015-06-22 — End: 2015-06-26
  Administered 2015-06-23 – 2015-06-26 (×4): 10 mg via ORAL
  Filled 2015-06-22 (×4): qty 1

## 2015-06-22 MED ORDER — ZOLPIDEM TARTRATE 5 MG PO TABS: 5.0000 mg | ORAL_TABLET | Freq: Every evening | ORAL | Status: DC | PRN

## 2015-06-22 MED ORDER — LIDOCAINE HCL (PF) 1 % IJ SOLN
INTRAMUSCULAR | Status: AC
  Filled 2015-06-22: qty 30

## 2015-06-22 MED ORDER — SODIUM CHLORIDE 0.9 % IV SOLN
INTRAVENOUS | Status: DC | PRN
Start: 2015-06-22 — End: 2015-06-22
  Administered 2015-06-22: 11:00:00 via INTRAVENOUS

## 2015-06-22 MED ORDER — PROPOFOL 10 MG/ML IV BOLUS
INTRAVENOUS | Status: DC | PRN
  Administered 2015-06-22: 140 mg via INTRAVENOUS

## 2015-06-22 MED ORDER — LIDOCAINE HCL (CARDIAC) 20 MG/ML IV SOLN
INTRAVENOUS | Status: DC | PRN
  Administered 2015-06-22: 80 mg via INTRAVENOUS

## 2015-06-22 MED ORDER — PHENYLEPHRINE HCL 10 MG/ML IJ SOLN
20.0000 mg | INTRAVENOUS | Status: DC | PRN
  Administered 2015-06-22: 20 ug/min via INTRAVENOUS

## 2015-06-22 MED ORDER — SODIUM CHLORIDE 0.9 % IV SOLN
INTRAVENOUS | Status: DC
  Administered 2015-06-22 – 2015-06-24 (×5): via INTRAVENOUS

## 2015-06-22 MED ORDER — ACETAMINOPHEN 325 MG PO TABS
650.0000 mg | ORAL_TABLET | Freq: Four times a day (QID) | ORAL | Status: DC | PRN
  Administered 2015-06-23 – 2015-06-24 (×4): 650 mg via ORAL
  Filled 2015-06-22 (×4): qty 2

## 2015-06-22 MED ORDER — ONDANSETRON HCL 4 MG/2ML IJ SOLN
INTRAMUSCULAR | Status: DC | PRN
Start: 2015-06-22 — End: 2015-06-22
  Administered 2015-06-22: 4 mg via INTRAVENOUS

## 2015-06-22 MED ORDER — FENTANYL CITRATE (PF) 100 MCG/2ML IJ SOLN
INTRAMUSCULAR | Status: AC
  Administered 2015-06-22: 17:00:00
  Filled 2015-06-22: qty 2

## 2015-06-22 SURGICAL SUPPLY — 61 items
BLADE SURG SZ10 CARB STEEL (BLADE) ×2 IMPLANT
CANISTER SUCT 1200ML W/VALVE (MISCELLANEOUS) ×2 IMPLANT
CATH TRAY 16F METER LATEX (MISCELLANEOUS) ×2 IMPLANT
CLEANER CAUTERY TIP 5X5 PAD (MISCELLANEOUS) IMPLANT
CLIP TI LARGE 6 (CLIP) IMPLANT
CLIP TI MEDIUM 6 (CLIP) IMPLANT
DRAPE LEGGINS SURG 28X43 STRL (DRAPES) IMPLANT
DRAPE SHEET LG 3/4 BI-LAMINATE (DRAPES) IMPLANT
DRESSING TELFA 4X3 1S ST N-ADH (GAUZE/BANDAGES/DRESSINGS) ×2 IMPLANT
DRSG OPSITE POSTOP 4X10 (GAUZE/BANDAGES/DRESSINGS) IMPLANT
DRSG OPSITE POSTOP 4X6 (GAUZE/BANDAGES/DRESSINGS) ×2 IMPLANT
DRSG OPSITE POSTOP 4X8 (GAUZE/BANDAGES/DRESSINGS) IMPLANT
DRSG TEGADERM 2-3/8X2-3/4 SM (GAUZE/BANDAGES/DRESSINGS) ×4 IMPLANT
ELECT BLADE 6.5 EXT (BLADE) ×2 IMPLANT
GAUZE SPONGE 4X4 12PLY STRL (GAUZE/BANDAGES/DRESSINGS) IMPLANT
GELPORT LAPAROSCOPIC (MISCELLANEOUS) IMPLANT
GLOVE BIO SURGEON STRL SZ7.5 (GLOVE) ×28 IMPLANT
GOWN STRL REUS W/ TWL LRG LVL3 (GOWN DISPOSABLE) ×10 IMPLANT
GOWN STRL REUS W/TWL LRG LVL3 (GOWN DISPOSABLE) ×10
HANDLE YANKAUER SUCT BULB TIP (MISCELLANEOUS) ×2 IMPLANT
IRRIGATION STRYKERFLOW (MISCELLANEOUS) ×1 IMPLANT
IRRIGATOR STRYKERFLOW (MISCELLANEOUS) ×2
IV NS 1000ML (IV SOLUTION) ×1
IV NS 1000ML BAXH (IV SOLUTION) ×1 IMPLANT
JELLY LUB 2OZ STRL (MISCELLANEOUS)
JELLY LUBE 2OZ STRL (MISCELLANEOUS) IMPLANT
KIT RM TURNOVER STRD PROC AR (KITS) ×2 IMPLANT
LABEL OR SOLS (LABEL) ×2 IMPLANT
NDL SAFETY 25GX1.5 (NEEDLE) ×2 IMPLANT
NS IRRIG 1000ML POUR BTL (IV SOLUTION) ×2 IMPLANT
PACK COLON CLEAN CLOSURE (MISCELLANEOUS) ×2 IMPLANT
PACK LAP CHOLECYSTECTOMY (MISCELLANEOUS) ×2 IMPLANT
PAD CLEANER CAUTERY TIP 5X5 (MISCELLANEOUS)
PAD GROUND ADULT SPLIT (MISCELLANEOUS) ×2 IMPLANT
PAD PREP 24X41 OB/GYN DISP (PERSONAL CARE ITEMS) IMPLANT
PENCIL ELECTRO HAND CTR (MISCELLANEOUS) ×2 IMPLANT
RELOAD PROXIMATE 75MM BLUE (ENDOMECHANICALS) ×6 IMPLANT
SCISSORS METZENBAUM CVD 33 (INSTRUMENTS) IMPLANT
SHEARS HARMONIC ACE PLUS 36CM (ENDOMECHANICALS) ×2 IMPLANT
SLEEVE ENDOPATH XCEL 5M (ENDOMECHANICALS) ×2 IMPLANT
SOL PREP PVP 2OZ (MISCELLANEOUS)
SOLUTION PREP PVP 2OZ (MISCELLANEOUS) IMPLANT
SPONGE LAP 18X18 5 PK (GAUZE/BANDAGES/DRESSINGS) ×2 IMPLANT
STAPLER GUN LINEAR PROX 60 (STAPLE) ×2 IMPLANT
STAPLER PROXIMATE 75MM BLUE (STAPLE) ×2 IMPLANT
STAPLER SKIN PROX 35W (STAPLE) ×2 IMPLANT
SUT ETHILON 4-0 (SUTURE)
SUT ETHILON 4-0 FS2 18XMFL BLK (SUTURE)
SUT NYLON 2-0 (SUTURE) IMPLANT
SUT PDS AB 1 TP1 96 (SUTURE) ×4 IMPLANT
SUT SILK 3-0 (SUTURE) ×3 IMPLANT
SUT SILK 3-0 SH-1 18XCR BRD (SUTURE) ×1
SUT VIC AB 0 UR5 27 (SUTURE) ×2 IMPLANT
SUT VIC AB 3-0 SH 27 (SUTURE) ×2
SUT VIC AB 3-0 SH 27X BRD (SUTURE) ×2 IMPLANT
SUTURE ETHLN 4-0 FS2 18XMF BLK (SUTURE) IMPLANT
SUTURE SILK 3-0 SH-1 18XCR BRD (SUTURE) ×1 IMPLANT
TROCAR XCEL BLUNT TIP 100MML (ENDOMECHANICALS) ×2 IMPLANT
TROCAR XCEL NON-BLD 5MMX100MML (ENDOMECHANICALS) ×2 IMPLANT
TUBING INSUFFLATOR HEATED (MISCELLANEOUS) ×2 IMPLANT
WATER STERILE IRR 1000ML POUR (IV SOLUTION) IMPLANT

## 2015-06-22 NOTE — Brief Op Note (Signed)
06/22/2015  12:30 PM  PATIENT:  Crystal White  74 y.o. female  PRE-OPERATIVE DIAGNOSIS:  Unresectable sessile serrated colon polyp at appendiceal orifice  POST-OPERATIVE DIAGNOSIS:  Same  PROCEDURE:  Procedure(s): LAPAROSCOPIC RIGHT HEMI COLECTOMY (Right)  SURGEON:  Surgeon(s) and Role:    * Marlyce Huge, MD - Primary    * Nestor Lewandowsky, MD - Assisting  PHYSICIAN ASSISTANT:   ASSISTANTS: none   ANESTHESIA:   general  EBL:  Total I/O In: 1400 [I.V.:1400] Out: - 50  BLOOD ADMINISTERED:none  DRAINS: none   LOCAL MEDICATIONS USED:  LIDOCAINE   SPECIMEN:  Excision  DISPOSITION OF SPECIMEN:  PATHOLOGY  COUNTS:  YES  TOURNIQUET:  * No tourniquets in log *  DICTATION: .Note written in EPIC  PLAN OF CARE: Admit to inpatient   PATIENT DISPOSITION:  PACU - hemodynamically stable.   Delay start of Pharmacological VTE agent (>24hrs) due to surgical blood loss or risk of bleeding: yes

## 2015-06-22 NOTE — Transfer of Care (Signed)
Immediate Anesthesia Transfer of Care Note  Patient: Crystal White  Procedure(s) Performed: Procedure(s): LAPAROSCOPIC RIGHT HEMI COLECTOMY (Right)  Patient Location: PACU  Anesthesia Type:General  Level of Consciousness: awake, alert  and oriented  Airway & Oxygen Therapy: Patient Spontanous Breathing and Patient connected to face mask oxygen  Post-op Assessment: Report given to RN and Post -op Vital signs reviewed and stable  Post vital signs: Reviewed and stable  Last Vitals:  Filed Vitals:   06/22/15 1011  BP:   Pulse:   Temp: 36.9 C  Resp:     Complications: No apparent anesthesia complications

## 2015-06-22 NOTE — Progress Notes (Signed)
I have personally seen and talked to patient.  No acute changes.  Proceed with lap assisted ileocecectomy.

## 2015-06-22 NOTE — Anesthesia Preprocedure Evaluation (Signed)
Anesthesia Evaluation  Patient identified by MRN, date of birth, ID band Patient awake    Reviewed: Allergy & Precautions, H&P , NPO status , Patient's Chart, lab work & pertinent test results, reviewed documented beta blocker date and time   Airway Mallampati: III  TM Distance: >3 FB Neck ROM: full    Dental  (+) Teeth Intact   Pulmonary sleep apnea , COPDCurrent Smoker,  breath sounds clear to auscultation        Cardiovascular hypertension, + pacemaker Rhythm:regular Rate:Normal     Neuro/Psych PSYCHIATRIC DISORDERS    GI/Hepatic PUD, GERD-  ,  Endo/Other    Renal/GU      Musculoskeletal   Abdominal   Peds  Hematology   Anesthesia Other Findings   Reproductive/Obstetrics                             Anesthesia Physical Anesthesia Plan  ASA: III  Anesthesia Plan: General ETT   Post-op Pain Management:    Induction:   Airway Management Planned:   Additional Equipment:   Intra-op Plan:   Post-operative Plan:   Informed Consent: I have reviewed the patients History and Physical, chart, labs and discussed the procedure including the risks, benefits and alternatives for the proposed anesthesia with the patient or authorized representative who has indicated his/her understanding and acceptance.     Plan Discussed with: CRNA  Anesthesia Plan Comments:         Anesthesia Quick Evaluation

## 2015-06-22 NOTE — Op Note (Signed)
Preoperative diagnosis: Sessile serrated polyp at appendiceal orifice Postoperative diagnosis: Same Procedure performed: Laparoscopic assisted ileocecectomy  Surgeon: Prentice Sackrider Assistant: Oaks  Anesthesia: General Endotracheal EBL: 50 ml Specimen: Ileocecum  Indication for surgery: Ms. Worthey is a pleasant 74 yo F who was referred to me with an unresectable cecal polyp at her appendiceal orifice.  She was anxious about it and did not want to undergo continued endoscopic surveillance and thus I had offered surgery to resect it in lieu of monitoring  Details of procedure: Informed consent was obtained.  Ms. Proffit was brought to the operating room suite.  He was induced, ETT placed, general anesthesia administered.    Abdomen prepped and draped in the standard surgical fashion and a timeout was performed correctly identifying patient name, operative site and procedure to be performed.  A supraumbilical incision was made and deepened to the fascia.  The fascia was incised and the peritoneum was entered.  Two stay sutures were placed and a hassan trocar was placed through the fasciotomy.  The abdomen was insufflated.  Suprapubic and LLQ 5 mm trocars were placed.    I then proceeded to take down the retroperitoneal attachments of the right colon and hepatic flexure using a harmonic scalpel.  Once mobilized, I then made a small midline incision.  The TI and Right colon were eviscerated.  I transected the TI using a GIA-75 stapler and the cecum with the same.  I inadvertantly made an enterotomy placing the stapler across the cecum and had to resect more downstream to allow for an anastamosis.  The mesoileum and mesocecum were taken down with harmonic scalpel.  The specimen was sent to pathology.  I then created a side-to-side, functional end-to-end anastamosis between the right colon and TI.  I did this by firing a GIA-75 to create a common channel and a TA-60 to close the ileocolotomy.   A 3-0 silk crotch  stitch was placed.  The bowel was then returned to the abdomen after I ensured that it was not twisted.  The abdomen was then irrigated with warm normal saline.  The incision was then closed from the top using a single #1 looped PDS suture which was then tied to itself at the distal end.  The skin was then closed with staples and a sterile dressing was placed.  The patient was then awoken and brought to the post anesthesia care unit.  There were no immediate complications.  The needle, sponge and instrument count was correct at the end of the procedure.

## 2015-06-23 DIAGNOSIS — K635 Polyp of colon: Secondary | ICD-10-CM

## 2015-06-23 LAB — COMPREHENSIVE METABOLIC PANEL
ALBUMIN: 3.1 g/dL — AB (ref 3.5–5.0)
ALT: 9 U/L — ABNORMAL LOW (ref 14–54)
ANION GAP: 7 (ref 5–15)
AST: 12 U/L — ABNORMAL LOW (ref 15–41)
Alkaline Phosphatase: 56 U/L (ref 38–126)
BILIRUBIN TOTAL: 0.3 mg/dL (ref 0.3–1.2)
BUN: 16 mg/dL (ref 6–20)
CO2: 26 mmol/L (ref 22–32)
CREATININE: 1.1 mg/dL — AB (ref 0.44–1.00)
Calcium: 7.9 mg/dL — ABNORMAL LOW (ref 8.9–10.3)
Chloride: 104 mmol/L (ref 101–111)
GFR calc Af Amer: 57 mL/min — ABNORMAL LOW (ref >60)
GFR calc non Af Amer: 49 mL/min — ABNORMAL LOW (ref >60)
GLUCOSE: 113 mg/dL — AB (ref 65–99)
Potassium: 4.5 mmol/L (ref 3.5–5.1)
SODIUM: 137 mmol/L (ref 135–145)
TOTAL PROTEIN: 5.9 g/dL — AB (ref 6.5–8.1)

## 2015-06-23 LAB — CBC
HCT: 41.5 % (ref 35.0–47.0)
Hemoglobin: 13.4 g/dL (ref 12.0–16.0)
MCH: 28.4 pg (ref 26.0–34.0)
MCHC: 32.2 g/dL (ref 32.0–36.0)
MCV: 88 fL (ref 80.0–100.0)
Platelets: 208 10*3/uL (ref 150–440)
RBC: 4.72 MIL/uL (ref 3.80–5.20)
RDW: 15.3 % — AB (ref 11.5–14.5)
WBC: 11.6 10*3/uL — ABNORMAL HIGH (ref 3.6–11.0)

## 2015-06-23 MED ORDER — NICOTINE 21 MG/24HR TD PT24
21.0000 mg | MEDICATED_PATCH | Freq: Every day | TRANSDERMAL | Status: DC
  Administered 2015-06-23 – 2015-06-26 (×4): 21 mg via TRANSDERMAL
  Filled 2015-06-23 (×4): qty 1

## 2015-06-23 NOTE — Anesthesia Postprocedure Evaluation (Signed)
  Anesthesia Post-op Note  Patient: Crystal White  Procedure(s) Performed: Procedure(s): LAPAROSCOPIC RIGHT HEMI COLECTOMY (Right)  Anesthesia type:General ETT  Patient location: PACU  Post pain: Pain level controlled  Post assessment: Post-op Vital signs reviewed, Patient's Cardiovascular Status Stable, Respiratory Function Stable, Patent Airway and No signs of Nausea or vomiting  Post vital signs: Reviewed and stable  Last Vitals:  Filed Vitals:   06/23/15 0340  BP: 114/56  Pulse: 95  Temp: 36.8 C  Resp: 17    Level of consciousness: awake, alert  and patient cooperative  Complications: No apparent anesthesia complications

## 2015-06-23 NOTE — Progress Notes (Signed)
Order obtained for nicotine patch 21mg  per day per Dr. Rexene Edison

## 2015-06-23 NOTE — Progress Notes (Signed)
Surgery Progress Note  S: Doing well.  Pain controlled, hungry O: Blood pressure 108/42, pulse 67, temperature 98 F (36.7 C), temperature source Oral, resp. rate 17, height 5\' 4"  (1.626 m), weight 72.576 kg (160 lb), SpO2 93 %. Good UOP GEN: NAD/A&Ox3 ABD: soft, approp tender, nondistended, incision c/d/i  Labs WBC 11.6 Hgb 13.4  [name] 74 yo s/p lap ileocecectomy, doing well - clear liquids - oob to chair

## 2015-06-24 ENCOUNTER — Inpatient Hospital Stay: Payer: Medicare Other

## 2015-06-24 LAB — TROPONIN I
Troponin I: 0.07 ng/mL — ABNORMAL HIGH (ref <0.031)
Troponin I: 0.03 ng/mL (ref <0.031)

## 2015-06-24 MED ORDER — KCL IN DEXTROSE-NACL 20-5-0.45 MEQ/L-%-% IV SOLN
INTRAVENOUS | Status: DC
  Administered 2015-06-24 – 2015-06-25 (×2): via INTRAVENOUS
  Filled 2015-06-24 (×4): qty 1000

## 2015-06-24 MED ORDER — ALUM & MAG HYDROXIDE-SIMETH 200-200-20 MG/5ML PO SUSP
15.0000 mL | Freq: Four times a day (QID) | ORAL | Status: DC | PRN
  Administered 2015-06-24: 15 mL via ORAL
  Filled 2015-06-24: qty 30

## 2015-06-24 NOTE — Progress Notes (Signed)
MD notified of elevated troponin.

## 2015-06-24 NOTE — Progress Notes (Signed)
  Called to patient bedside due to new onset chest pain. Patient reports that "she doesn't know what her pain is", but it it is in her mid chest and radiates around to her neck on the right side. She has never had anything like this before.  Filed Vitals:   06/24/15 0414  BP: 131/84  Pulse: 73  Temp: 98.1 F (36.7 C)  Resp: 24   On exam she has equal breath sounds but shallow breaths. Her heart is still with a regular rate and rhythm with a pacer in her left chest.  A chest X-Ray and EKG were obtained. EKG reviewed by me and appears unchanged from her preop EKG in the system. CXR with very decreased lung volumes bilaterally compared to preop but without any obvious pneumothorax or acute pathology.  Given her symptoms a set of troponins were also ordered.  She was given her pain medication after the onset of this pain and she has stated that the pain has been improving since receiving the pain meds.  Will continue to follow closely and will follow up labs. If troponin is elevated will have internal medicine/cardiology see as well.  Clayburn Pert, MD FACS General Surgeon [facility]

## 2015-06-24 NOTE — Progress Notes (Signed)
MD notified this shift of chest pains and SOB. Stat EKG and CXR ordered along with troponin. PRN pain meds given and effective. Still awaiting results for troponin. Will continue to monitor.

## 2015-06-24 NOTE — Clinical Documentation Improvement (Signed)
Based on your clinical judgment, please clarify cause of Chest Pain:   Possible Clinical Conditions?  Acute MI Unstable Angina/ACS ACS Musculoskeletal Chest Pain Pleuritic Chest Pain/ Chest wall pain GERD Costochondritis Cholelithiasis / Cholecystitis Dressler's syndrome/Post Myocardial Infarction Syndrome Other Condition Cannot Clinically Determine    Supporting Information: Patient complaining of chest pain and shortness of breath per 7/27 progress notes.  Diagnostics: 7/27:  Troponin I:  0.07.   Thank You, Jeannetta Ellis ,RN Clinical Documentation Specialist:  [phone]  [place] Information Management

## 2015-06-24 NOTE — Evaluation (Signed)
Physical Therapy Evaluation Patient Details Name: Crystal White MRN: [id number] DOB: March 01, 1941 Today's Date: 06/24/2015   History of Present Illness  Pt is a 74 y.o. female s/p laparoscopic R hemi colectomy 06/22/15.  Pt with chest pains and SOB AM 06/24/15 (troponins downtrending with peak 0.07; EKG obtained; nursing reports chest pains related to lung issues and cleared pt for participation in PT).  PMH includes:  COPD, htn, pacemaker  Clinical Impression  Currently pt demonstrates impairments with balance, pain, and limitations with functional mobility.  Prior to admission, pt was independent without AD.  Pt lives alone but has family that is available to assist upon discharge.  Currently pt is CGA with transfers and ambulation without AD (pt occasionally using hallway railing to steady herself).  Pt reporting chest soreness with breathing (nursing aware).  Pt's O2 decreased from 91% (at rest) to 84% (post ambulation) on 3 L/min via nasal cannula (nursing notified and O2 increased back to 92% on 3 L/min via nasal cannula with rest in bed).  Pt would benefit from skilled PT to address above noted impairments and functional limitations.  Recommend pt discharge to home with support of family as needed when medically appropriate.  Anticipate with continued progress in hospital pt will not need further PT upon discharge from hospital.     Follow Up Recommendations No PT follow up (Assist from family as needed)    Equipment Recommendations  None recommended by PT    Recommendations for Other Services       Precautions / Restrictions Precautions Precautions: Fall Precaution Comments: abdominal incision Restrictions Weight Bearing Restrictions: No      Mobility  Bed Mobility Overal bed mobility: Needs Assistance Bed Mobility: Sit to Sidelying;Sit to Supine       Sit to supine: Min assist Sit to sidelying: Min assist General bed mobility comments: assist required for logrolling  technique to protect abdominal incision  Transfers Overall transfer level: Needs assistance Equipment used: None Transfers: Sit to/from Stand Sit to Stand: Min guard         General transfer comment: no loss of balance with transfers noted  Ambulation/Gait Ambulation/Gait assistance: Min guard Ambulation Distance (Feet): 200 Feet Assistive device: None Gait Pattern/deviations: Step-through pattern Gait velocity: mildly decreased   General Gait Details: occasional altered stepping pattern but no loss of balance requiring assist to prevent fall; pt occasionally using railing in hallway to steady herself  Stairs            Wheelchair Mobility    Modified Rankin (Stroke Patients Only)       Balance Overall balance assessment: Needs assistance Sitting-balance support: No upper extremity supported;Feet supported Sitting balance-Leahy Scale: Good     Standing balance support: No upper extremity supported Standing balance-Leahy Scale: Good                               Pertinent Vitals/Pain Pain Assessment: 0-10 Pain Score: 8  Pain Location: "all over" including chest soreness with breathing Pain Descriptors / Indicators: Sore Pain Intervention(s): Limited activity within patient's tolerance;Monitored during session  See PT clinical impression for O2 sats.  Pt's HR 77-91 bpm during session.    Home Living Family/patient expects to be discharged to:: Private residence Living Arrangements: Alone Available Help at Discharge: Family (pt's son lives close and pt's daughter and grandson are available to assist as needed as well) Type of Home: House Home Access: Stairs to enter  Entrance Stairs-Rails: Left Entrance Stairs-Number of Steps: 3 Home Layout: One level Home Equipment: Walker - 2 wheels;Walker - 4 wheels;Cane - single point;Shower seat      Prior Function Level of Independence: Independent               Hand Dominance         Extremity/Trunk Assessment   Upper Extremity Assessment: Overall WFL for tasks assessed           Lower Extremity Assessment: Overall WFL for tasks assessed         Communication   Communication: No difficulties  Cognition Arousal/Alertness: Awake/alert Behavior During Therapy: WFL for tasks assessed/performed Overall Cognitive Status: Within Functional Limits for tasks assessed                      General Comments General comments (skin integrity, edema, etc.): dried blood noted on abdominal dressing.  Nursing cleared pt for participation in physical therapy (reports chest pain related to lung issues).  Pt agreeable to PT session and reports walking around the nurses station yesterday.     Exercises   Performed semi-supine B LE therapeutic exercise x 10 reps:  Ankle pumps (AROM B LE's); quad sets x3 second holds (AROM B LE's); glute squeezes x3 second holds (AROM B); SAQ's (AROM R; AROM L); heelslides (AROM R; AROM L).  Pt required vc's and tactile cues for correct technique with exercises.       Assessment/Plan    PT Assessment Patient needs continued PT services  PT Diagnosis Difficulty walking   PT Problem List Decreased strength;Decreased activity tolerance;Decreased balance;Decreased mobility;Decreased knowledge of precautions;Pain  PT Treatment Interventions DME instruction;Gait training;Stair training;Functional mobility training;Therapeutic activities;Therapeutic exercise;Balance training;Patient/family education   PT Goals (Current goals can be found in the Care Plan section) Acute Rehab PT Goals Patient Stated Goal: To go home PT Goal Formulation: With patient Time For Goal Achievement: 07/08/15 Potential to Achieve Goals: Good    Frequency Min 2X/week   Barriers to discharge        Co-evaluation               End of Session Equipment Utilized During Treatment: Gait belt (at armpit level d/t abdominal incision) Activity Tolerance:  Patient tolerated treatment well Patient left: in bed;with call bell/phone within reach;with bed alarm set;with SCD's reapplied Nurse Communication: Mobility status (O2 desaturation with ambulation)         Time: 5427-0623 PT Time Calculation (min) (ACUTE ONLY): 32 min   Charges:   PT Evaluation $Initial PT Evaluation Tier I: 1 Procedure PT Treatments $Therapeutic Exercise: 8-22 mins   PT G CodesLeitha Bleak 16-Jul-2015, 4:58 PM Leitha Bleak, [place]

## 2015-06-24 NOTE — Care Management Important Message (Signed)
Important Message  Patient Details  Name: Crystal White MRN: [id number] Date of Birth: 08/17/1941   Medicare Important Message Given:  Yes-second notification given    Juliann Pulse A Allmond 06/24/2015, 11:41 AM

## 2015-06-24 NOTE — Progress Notes (Signed)
Surgery Progress Note  S: Events of last night noted.  C/o heartburn, + flatus O:Blood pressure 121/46, pulse 80, temperature 97.9 F (36.6 C), temperature source Oral, resp. rate 18, height 5\' 4"  (1.626 m), weight 72.576 kg (160 lb), SpO2 91 %. GEN: NAD/A&Ox3 ABD: soft, min tender, nondistended  [name] 74 yo s/p lap ileocecectomy - f/u troponins - PT - clears for now

## 2015-06-25 LAB — SURGICAL PATHOLOGY

## 2015-06-25 MED ORDER — HYDROCODONE-ACETAMINOPHEN 5-325 MG PO TABS
1.0000 | ORAL_TABLET | Freq: Four times a day (QID) | ORAL | Status: DC | PRN
  Administered 2015-06-25 – 2015-06-26 (×3): 2 via ORAL
  Filled 2015-06-25 (×3): qty 2

## 2015-06-25 NOTE — Plan of Care (Addendum)
Problem: Phase II Progression Outcomes Goal: Other Phase II Outcomes/Goals Outcome: Progressing Pt is alert and oriented x 4, c/o abdominal pain improved with dilaudid, denies n/v, tolerating clear liquid diet, diet advanced to regular, up to bathroom with one person assist, no bm throughout shift, passing gas, iv fluids infusing, vital signs stable, afebrile throughout shift, foley removed, awaiting bm for d/c, abdominal dressings dry and intact. Uneventful shift. Report given to Remo Lipps at 15:00

## 2015-06-25 NOTE — Care Management (Signed)
Spoke with patient for discharge planning. Patient from home and independent. Has son and daughter nearby. Has a Walker at home but doesn't use it. Drives self. PCP is Dr Rosanna Randy.No difficulty with medications. No CM needs Identified

## 2015-06-25 NOTE — Progress Notes (Signed)
Per MD, remove foley. Verbal via Dr. Rexene Edison

## 2015-06-25 NOTE — Progress Notes (Signed)
Surgery Progress Note  S: No acute issues.  + flatus, hungry O: Blood pressure 126/53, pulse 81, temperature 98.6 F (37 C), temperature source Oral, resp. rate 17, height 5\' 4"  (1.626 m), weight 72.576 kg (160 lb), SpO2 93 %. GEN: NAD/A&Ox3 ABD: soft, mild tender, nondistended  [name] 74 yo F s/p lap ileocecectomy, doing well - regular diet - PO pain meds - await bm

## 2015-06-26 MED ORDER — NICOTINE 21 MG/24HR TD PT24: 21.0000 mg | MEDICATED_PATCH | Freq: Every day | TRANSDERMAL | Status: DC

## 2015-06-26 MED ORDER — HYDROCODONE-ACETAMINOPHEN 5-325 MG PO TABS: 1.0000 | ORAL_TABLET | Freq: Four times a day (QID) | ORAL | Status: DC | PRN

## 2015-06-26 NOTE — Care Management Important Message (Signed)
Important Message  Patient Details  Name: Crystal White MRN: [id number] Date of Birth: [birth year]   Medicare Important Message Given:  Yes-third notification given    Shelbie Ammons 06/26/2015, 11:53 AM

## 2015-06-26 NOTE — Discharge Instructions (Signed)
Do not drive on pain medications Do not lift greater than 15 lbs for a period of 6 weeks Call or return to ER if you develop fever greater than 101.5, nausea/vomiting, increased pain, redness/drainage from incisions Take bandages off in 48 hours.  Okay to shower with bandages on or after they come off, no tub baths

## 2015-06-26 NOTE — Progress Notes (Signed)
Pt ambulated around nursing unit four times at a moderate pace on oxygen 3L. Tolerated well , pox sat after ambulation maintained at 90-93%. On room air pt desats pox at 87-89%. Pt with continued infrequent productive cough, for white thin sputum. Abdominal surgical site with honey comb dressing with old dried blood, but intact. Some discomfort and pain rated 7/10. Prn pain meds given per Doctor order (see mar). Pt voiding and positive for flatus. Will continue to monitor per unit protocol and M.D. Order. Phone and call light in reach.

## 2015-06-26 NOTE — Progress Notes (Signed)
Initial Nutrition Assessment   INTERVENTION:   Meals and Snacks: Cater to patient preferences Education: RD provided "Low Fiber Nutrition Therapy" handout from the Academy of [employer]. Discussed nutrition therapy to reduce the irritation of the gastrointestinal tract to promote healing. Provided list of recommended low fiberous foods in comparison to foods with high amounts of fiber, as well as a recommended sample day. Teach back method used. Expect good compliance.   NUTRITION DIAGNOSIS:   Inadequate oral intake related to acute illness as evidenced by  (NPO/CL for 3 days).  GOAL:   Patient will meet greater than or equal to 90% of their needs   MONITOR:    (Energy Intake, Digestive System, Anthropometrics)  REASON FOR ASSESSMENT:   LOS    ASSESSMENT:   Pt s/p ileocecectomy secondary to colon polyp.   Past Medical History  Diagnosis Date  . Hypertension   . COPD (chronic obstructive pulmonary disease)   . Hyperlipidemia   . OSA (obstructive sleep apnea)   . Presence of permanent cardiac pacemaker     2014- [facility]  . GERD (gastroesophageal reflux disease)   . Stomach ulcer      Diet Order:  Diet regular Room service appropriate?: Yes; Fluid consistency:: Thin    Current Nutrition: Pt ate fruit from breakfast tray this am. Pt reports not being a breakfast eater normally. Pt reports eating 100% of dinner last night and tolerated well.  Food/Nutrition-Related History: Pt reports eating snacks throughout the day and then eating big meals around 11pm most days. Pt reports hoping to continue eating 3 meals during the day as she has been a the hospital when she returns home.   Medications: D5 with 0.45%NS with KCl at 29mL/hr (providing 204kcals in 24 hours), Protonix, Remeron  Electrolyte/Renal Profile and Glucose Profile:   Recent Labs Lab 06/22/15 1405 06/23/15 0341  NA  --  137  K  --  4.5  CL  --  104  CO2  --  26  BUN  --  16  CREATININE  1.39* 1.10*  CALCIUM  --  7.9*  GLUCOSE  --  113*   Protein Profile:  Recent Labs Lab 06/23/15 0341  ALBUMIN 3.1*    Gastrointestinal Profile:  Last BM:  06/20/2015   Weight Change: Pt reports stable weight PTA.  Skin:  Reviewed, no issues  Height:   Ht Readings from Last 1 Encounters:  06/22/15 5\' 4"  (1.626 m)    Weight:   Wt Readings from Last 1 Encounters:  06/22/15 160 lb (72.576 kg)    BMI:  Body mass index is 27.45 kg/(m^2).   EDUCATION NEEDS:   Education needs addressed  LOW Care Level  Dwyane Luo, [place], LDN Pager [phone]

## 2015-06-26 NOTE — Progress Notes (Signed)
Surgery Progress Note  S: Some pain with coughing. + BM O: Blood pressure 152/57, pulse 83, temperature 97.9 F (36.6 C), temperature source Oral, resp. rate 18, height 5\' 4"  (1.626 m), weight 72.576 kg (160 lb), SpO2 97 %. GEN: NAD/A&Ox3 ABD: soft, min tender, nondistended  [name] 74 yo s/p ileocecectomy, doing well - regular diet - PO pain meds - wean oxygen - d/c home possibly later

## 2015-06-27 NOTE — Discharge Summary (Signed)
Discharge Diagnosis: Unresectable cecal polyp Procedure performed: Laparoscopic ileocecectomy    Medication List    STOP taking these medications        bisacodyl 5 MG EC tablet  Commonly known as:  DULCOLAX     polyethylene glycol powder powder  Commonly known as:  GLYCOLAX/MIRALAX      TAKE these medications        acetaminophen 500 MG tablet  Commonly known as:  TYLENOL  Take 2 tablets by mouth every 4 (four) hours as needed for mild pain.     ALPRAZolam 0.25 MG tablet  Commonly known as:  XANAX  Take 0.25 mg by mouth 2 (two) times daily as needed for anxiety.     BELSOMRA 10 MG Tabs  Generic drug:  Suvorexant  Take 10 mg by mouth at bedtime as needed.     budesonide-formoterol 160-4.5 MCG/ACT inhaler  Commonly known as:  SYMBICORT  Inhale 1 puff into the lungs 2 (two) times daily.     citalopram 10 MG tablet  Commonly known as:  CELEXA  Take 10 mg by mouth daily.     fluticasone 50 MCG/ACT nasal spray  Commonly known as:  FLONASE  Place 2 sprays into both nostrils daily as needed for rhinitis.     gabapentin 100 MG capsule  Commonly known as:  NEURONTIN  Take 200 mg by mouth 3 (three) times daily.     hydrochlorothiazide 25 MG tablet  Commonly known as:  HYDRODIURIL  Take 25 mg by mouth daily.     HYDROcodone-acetaminophen 5-325 MG per tablet  Commonly known as:  NORCO/VICODIN  Take 1-2 tablets by mouth every 6 (six) hours as needed for moderate pain.     hyoscyamine 0.375 MG 12 hr tablet  Commonly known as:  LEVBID  Take 0.375 mg by mouth 2 (two) times daily as needed for cramping.     meclizine 25 MG tablet  Commonly known as:  ANTIVERT  Take 1 tablet by mouth 2 (two) times daily as needed for dizziness.     mirtazapine 30 MG tablet  Commonly known as:  REMERON  Take 30 mg by mouth at bedtime.     nicotine 21 mg/24hr patch  Commonly known as:  NICODERM CQ - dosed in mg/24 hours  Place 1 patch (21 mg total) onto the skin daily.     pantoprazole 40 MG tablet  Commonly known as:  PROTONIX  Take 40 mg by mouth 2 (two) times daily.     PROVENTIL HFA 108 (90 BASE) MCG/ACT inhaler  Generic drug:  albuterol  Inhale 2 puffs into the lungs every 6 (six) hours as needed for wheezing or shortness of breath.     traZODone 150 MG tablet  Commonly known as:  DESYREL  Take 150 mg by mouth at bedtime.     zolpidem 5 MG tablet  Commonly known as:  AMBIEN  Take 5 mg by mouth at bedtime as needed for sleep.       Hospital Course: Crystal White underwent unremarkable laparoscopic ileocecectomy.  Postoperatively she was made NPO with IV pain control.  As her bowel function returned, she was advanced from clear liquid to regular diet and from IV pain medications to PO pain medications.  At time of discharge, Crystal White was tolerating a regular diet with good PO pain control.

## 2015-07-03 ENCOUNTER — Encounter: Payer: Self-pay | Admitting: Surgery

## 2015-07-03 ENCOUNTER — Ambulatory Visit (INDEPENDENT_AMBULATORY_CARE_PROVIDER_SITE_OTHER): Payer: Medicare Other | Admitting: Surgery

## 2015-07-03 VITALS — BP 131/84 | HR 88 | Temp 98.2°F | Ht 64.0 in | Wt 164.4 lb

## 2015-07-03 DIAGNOSIS — Z09 Encounter for follow-up examination after completed treatment for conditions other than malignant neoplasm: Secondary | ICD-10-CM

## 2015-07-03 NOTE — Progress Notes (Signed)
Surgery Clinic Note  S: Doing well.  No pain.  Tolerating diet.  Regular BM O:Blood pressure 131/84, pulse 88, temperature 98.2 F (36.8 C), temperature source Oral, height 5\' 4"  (1.626 m), weight 74.571 kg (164 lb 6.4 oz). GEN: NAD/A&Ox3 ABD: soft, min tender, nondistended, incisions c/d/i  [name] 74 yo s/p ileocecectomy for unresectable appendiceal orifice polyp.  Have reviewed path.  Initial stapling of cecum was quite close to orifice and I had to restaple to a more distal margin to make sure that I had resected orifice.  I feel that proximal staple line may have contained tissue of question.  I do feel confident that the appendiceal orifice was removed in its entirety and this is where Dr. Gustavo Lah said that the polyp was with confidence.  No acute issues.  Will likely need f/u colonoscopy in 3 years.

## 2015-07-03 NOTE — Patient Instructions (Signed)
Do not drive on pain medications Do not lift greater than 15 lbs for a period of 6 weeks Call or return to ER if you develop fever greater than 101.5, nausea/vomiting, increased pain, redness/drainage from incisions Okay to shower

## 2015-07-05 NOTE — Addendum Note (Signed)
Addendum  created 07/05/15 9672 by Molli Barrows, MD   Modules edited: Anesthesia Events, Narrator   Narrator:  Narrator: Event Log Edited

## 2015-07-13 ENCOUNTER — Observation Stay
Admission: EM | Admit: 2015-07-13 | Discharge: 2015-07-14 | Disposition: A | Discharge status: Home / Self Care | Payer: Medicare Other | Attending: Internal Medicine | Admitting: Internal Medicine

## 2015-07-13 ENCOUNTER — Telehealth: Payer: Self-pay | Admitting: Family Medicine

## 2015-07-13 ENCOUNTER — Encounter: Payer: Self-pay | Admitting: Emergency Medicine

## 2015-07-13 ENCOUNTER — Emergency Department: Payer: Medicare Other

## 2015-07-13 ENCOUNTER — Ambulatory Visit: Payer: Medicare Other | Admitting: Physician Assistant

## 2015-07-13 ENCOUNTER — Observation Stay: Payer: Medicare Other

## 2015-07-13 DIAGNOSIS — Z79899 Other long term (current) drug therapy: Secondary | ICD-10-CM | POA: Insufficient documentation

## 2015-07-13 DIAGNOSIS — K2270 Barrett's esophagus without dysplasia: Secondary | ICD-10-CM | POA: Diagnosis not present

## 2015-07-13 DIAGNOSIS — J438 Other emphysema: Secondary | ICD-10-CM

## 2015-07-13 DIAGNOSIS — Z950 Presence of cardiac pacemaker: Secondary | ICD-10-CM | POA: Diagnosis not present

## 2015-07-13 DIAGNOSIS — Z85038 Personal history of other malignant neoplasm of large intestine: Secondary | ICD-10-CM | POA: Insufficient documentation

## 2015-07-13 DIAGNOSIS — G629 Polyneuropathy, unspecified: Secondary | ICD-10-CM | POA: Insufficient documentation

## 2015-07-13 DIAGNOSIS — F17210 Nicotine dependence, cigarettes, uncomplicated: Secondary | ICD-10-CM | POA: Insufficient documentation

## 2015-07-13 DIAGNOSIS — R0902 Hypoxemia: Secondary | ICD-10-CM | POA: Diagnosis not present

## 2015-07-13 DIAGNOSIS — R0602 Shortness of breath: Secondary | ICD-10-CM | POA: Diagnosis not present

## 2015-07-13 DIAGNOSIS — N189 Chronic kidney disease, unspecified: Secondary | ICD-10-CM | POA: Insufficient documentation

## 2015-07-13 DIAGNOSIS — J441 Chronic obstructive pulmonary disease with (acute) exacerbation: Secondary | ICD-10-CM | POA: Insufficient documentation

## 2015-07-13 DIAGNOSIS — J9610 Chronic respiratory failure, unspecified whether with hypoxia or hypercapnia: Secondary | ICD-10-CM | POA: Diagnosis not present

## 2015-07-13 DIAGNOSIS — I441 Atrioventricular block, second degree: Secondary | ICD-10-CM | POA: Insufficient documentation

## 2015-07-13 DIAGNOSIS — N170 Acute kidney failure with tubular necrosis: Secondary | ICD-10-CM | POA: Diagnosis not present

## 2015-07-13 DIAGNOSIS — N179 Acute kidney failure, unspecified: Secondary | ICD-10-CM | POA: Insufficient documentation

## 2015-07-13 DIAGNOSIS — K219 Gastro-esophageal reflux disease without esophagitis: Secondary | ICD-10-CM | POA: Insufficient documentation

## 2015-07-13 DIAGNOSIS — Z9049 Acquired absence of other specified parts of digestive tract: Secondary | ICD-10-CM | POA: Diagnosis not present

## 2015-07-13 DIAGNOSIS — R079 Chest pain, unspecified: Principal | ICD-10-CM | POA: Diagnosis present

## 2015-07-13 DIAGNOSIS — E785 Hyperlipidemia, unspecified: Secondary | ICD-10-CM | POA: Insufficient documentation

## 2015-07-13 DIAGNOSIS — J440 Chronic obstructive pulmonary disease with acute lower respiratory infection: Secondary | ICD-10-CM | POA: Diagnosis not present

## 2015-07-13 DIAGNOSIS — R05 Cough: Secondary | ICD-10-CM | POA: Diagnosis not present

## 2015-07-13 DIAGNOSIS — G4733 Obstructive sleep apnea (adult) (pediatric): Secondary | ICD-10-CM | POA: Insufficient documentation

## 2015-07-13 DIAGNOSIS — J209 Acute bronchitis, unspecified: Secondary | ICD-10-CM

## 2015-07-13 DIAGNOSIS — N289 Disorder of kidney and ureter, unspecified: Secondary | ICD-10-CM

## 2015-07-13 DIAGNOSIS — R918 Other nonspecific abnormal finding of lung field: Secondary | ICD-10-CM | POA: Diagnosis not present

## 2015-07-13 DIAGNOSIS — R0789 Other chest pain: Secondary | ICD-10-CM

## 2015-07-13 DIAGNOSIS — I739 Peripheral vascular disease, unspecified: Secondary | ICD-10-CM | POA: Insufficient documentation

## 2015-07-13 DIAGNOSIS — Z7951 Long term (current) use of inhaled steroids: Secondary | ICD-10-CM | POA: Insufficient documentation

## 2015-07-13 DIAGNOSIS — I129 Hypertensive chronic kidney disease with stage 1 through stage 4 chronic kidney disease, or unspecified chronic kidney disease: Secondary | ICD-10-CM | POA: Insufficient documentation

## 2015-07-13 HISTORY — DX: Polyneuropathy, unspecified: G62.9

## 2015-07-13 HISTORY — DX: Barrett's esophagus without dysplasia: K22.70

## 2015-07-13 HISTORY — DX: Atrioventricular block, second degree: I44.1

## 2015-07-13 LAB — BASIC METABOLIC PANEL
ANION GAP: 11 (ref 5–15)
BUN: 28 mg/dL — ABNORMAL HIGH (ref 6–20)
CALCIUM: 9 mg/dL (ref 8.9–10.3)
CHLORIDE: 99 mmol/L — AB (ref 101–111)
CO2: 29 mmol/L (ref 22–32)
Creatinine, Ser: 1.68 mg/dL — ABNORMAL HIGH (ref 0.44–1.00)
GFR calc non Af Amer: 29 mL/min — ABNORMAL LOW (ref >60)
GFR, EST AFRICAN AMERICAN: 34 mL/min — AB (ref >60)
GLUCOSE: 108 mg/dL — AB (ref 65–99)
Potassium: 3.9 mmol/L (ref 3.5–5.1)
Sodium: 139 mmol/L (ref 135–145)

## 2015-07-13 LAB — CBC
HEMATOCRIT: 44.8 % (ref 35.0–47.0)
HEMOGLOBIN: 14.5 g/dL (ref 12.0–16.0)
MCH: 28.2 pg (ref 26.0–34.0)
MCHC: 32.5 g/dL (ref 32.0–36.0)
MCV: 86.9 fL (ref 80.0–100.0)
Platelets: 323 10*3/uL (ref 150–440)
RBC: 5.15 MIL/uL (ref 3.80–5.20)
RDW: 15.9 % — ABNORMAL HIGH (ref 11.5–14.5)
WBC: 10.3 10*3/uL (ref 3.6–11.0)

## 2015-07-13 LAB — FIBRIN DERIVATIVES D-DIMER (ARMC ONLY): Fibrin derivatives D-dimer (ARMC): 1819.7 — ABNORMAL HIGH (ref 0–499)

## 2015-07-13 LAB — TROPONIN I

## 2015-07-13 LAB — CKMB (ARMC ONLY): CK, MB: 1.2 ng/mL (ref 0.5–5.0)

## 2015-07-13 MED ORDER — MECLIZINE HCL 25 MG PO TABS: 25.0000 mg | ORAL_TABLET | Freq: Two times a day (BID) | ORAL | Status: DC | PRN

## 2015-07-13 MED ORDER — HYOSCYAMINE SULFATE ER 0.375 MG PO TB12
0.3750 mg | ORAL_TABLET | Freq: Two times a day (BID) | ORAL | Status: DC | PRN
  Filled 2015-07-13: qty 1

## 2015-07-13 MED ORDER — HEPARIN SODIUM (PORCINE) 5000 UNIT/ML IJ SOLN
5000.0000 [IU] | Freq: Three times a day (TID) | INTRAMUSCULAR | Status: DC
  Administered 2015-07-13 – 2015-07-14 (×3): 5000 [IU] via SUBCUTANEOUS
  Filled 2015-07-13 (×3): qty 1

## 2015-07-13 MED ORDER — ZOLPIDEM TARTRATE 5 MG PO TABS: 5.0000 mg | ORAL_TABLET | Freq: Every evening | ORAL | Status: DC | PRN

## 2015-07-13 MED ORDER — CITALOPRAM HYDROBROMIDE 20 MG PO TABS
10.0000 mg | ORAL_TABLET | Freq: Every day | ORAL | Status: DC
  Administered 2015-07-13: 10 mg via ORAL
  Filled 2015-07-13: qty 1

## 2015-07-13 MED ORDER — TECHNETIUM TC 99M DIETHYLENETRIAME-PENTAACETIC ACID
43.5210 | Freq: Once | INTRAVENOUS | Status: DC | PRN
  Administered 2015-07-13: 43.521 via INTRAVENOUS
  Filled 2015-07-13: qty 43.52

## 2015-07-13 MED ORDER — TRAZODONE HCL 50 MG PO TABS
150.0000 mg | ORAL_TABLET | Freq: Every day | ORAL | Status: DC
  Administered 2015-07-13: 150 mg via ORAL
  Filled 2015-07-13: qty 1

## 2015-07-13 MED ORDER — HYDROCHLOROTHIAZIDE 25 MG PO TABS
25.0000 mg | ORAL_TABLET | Freq: Every day | ORAL | Status: DC
Start: 2015-07-13 — End: 2015-07-14
  Administered 2015-07-13 – 2015-07-14 (×2): 25 mg via ORAL
  Filled 2015-07-13 (×3): qty 1

## 2015-07-13 MED ORDER — DOCUSATE SODIUM 100 MG PO CAPS: 100.0000 mg | ORAL_CAPSULE | Freq: Two times a day (BID) | ORAL | Status: DC | PRN

## 2015-07-13 MED ORDER — SIMETHICONE 80 MG PO CHEW: 160.0000 mg | CHEWABLE_TABLET | Freq: Two times a day (BID) | ORAL | Status: DC | PRN

## 2015-07-13 MED ORDER — HYDROCODONE-ACETAMINOPHEN 5-325 MG PO TABS
1.0000 | ORAL_TABLET | Freq: Four times a day (QID) | ORAL | Status: DC | PRN
  Administered 2015-07-14 (×3): 1 via ORAL
  Filled 2015-07-13 (×3): qty 1

## 2015-07-13 MED ORDER — CYCLOBENZAPRINE HCL 10 MG PO TABS: 5.0000 mg | ORAL_TABLET | Freq: Three times a day (TID) | ORAL | Status: DC | PRN

## 2015-07-13 MED ORDER — ALBUTEROL SULFATE (2.5 MG/3ML) 0.083% IN NEBU
2.5000 mg | INHALATION_SOLUTION | Freq: Four times a day (QID) | RESPIRATORY_TRACT | Status: DC | PRN
Start: 2015-07-13 — End: 2015-07-14

## 2015-07-13 MED ORDER — ALPRAZOLAM 0.25 MG PO TABS
0.2500 mg | ORAL_TABLET | Freq: Two times a day (BID) | ORAL | Status: DC | PRN
  Administered 2015-07-14: 0.25 mg via ORAL
  Filled 2015-07-13: qty 1

## 2015-07-13 MED ORDER — SODIUM CHLORIDE 0.9 % IV SOLN
INTRAVENOUS | Status: DC
  Administered 2015-07-13 – 2015-07-14 (×2): via INTRAVENOUS

## 2015-07-13 MED ORDER — PANTOPRAZOLE SODIUM 40 MG PO TBEC
40.0000 mg | DELAYED_RELEASE_TABLET | Freq: Two times a day (BID) | ORAL | Status: DC
  Administered 2015-07-13 – 2015-07-14 (×2): 40 mg via ORAL
  Filled 2015-07-13 (×2): qty 1

## 2015-07-13 MED ORDER — BUDESONIDE-FORMOTEROL FUMARATE 160-4.5 MCG/ACT IN AERO
2.0000 | INHALATION_SPRAY | Freq: Two times a day (BID) | RESPIRATORY_TRACT | Status: DC
  Administered 2015-07-13 – 2015-07-14 (×2): 2 via RESPIRATORY_TRACT
  Filled 2015-07-13: qty 6

## 2015-07-13 MED ORDER — MAGNESIUM OXIDE 400 (241.3 MG) MG PO TABS
400.0000 mg | ORAL_TABLET | Freq: Two times a day (BID) | ORAL | Status: DC
  Administered 2015-07-13 – 2015-07-14 (×2): 400 mg via ORAL
  Filled 2015-07-13 (×2): qty 1

## 2015-07-13 MED ORDER — MIRTAZAPINE 30 MG PO TABS
30.0000 mg | ORAL_TABLET | Freq: Every day | ORAL | Status: DC
  Administered 2015-07-13: 30 mg via ORAL
  Filled 2015-07-13: qty 1

## 2015-07-13 MED ORDER — TECHNETIUM TO 99M ALBUMIN AGGREGATED
4.2750 | Freq: Once | INTRAVENOUS | Status: AC | PRN
  Administered 2015-07-13: 4.275 via INTRAVENOUS

## 2015-07-13 MED ORDER — ACETAMINOPHEN 500 MG PO TABS: 1000.0000 mg | ORAL_TABLET | ORAL | Status: DC | PRN

## 2015-07-13 MED ORDER — SUVOREXANT 10 MG PO TABS: 10.0000 mg | ORAL_TABLET | Freq: Every evening | ORAL | Status: DC | PRN

## 2015-07-13 MED ORDER — FLUTICASONE PROPIONATE 50 MCG/ACT NA SUSP
2.0000 | Freq: Every day | NASAL | Status: DC | PRN
  Filled 2015-07-13: qty 16

## 2015-07-13 MED ORDER — NICOTINE 21 MG/24HR TD PT24
21.0000 mg | MEDICATED_PATCH | Freq: Every day | TRANSDERMAL | Status: DC
  Administered 2015-07-13 – 2015-07-14 (×2): 21 mg via TRANSDERMAL
  Filled 2015-07-13 (×2): qty 1

## 2015-07-13 MED ORDER — SENNA 8.6 MG PO TABS: 1.0000 | ORAL_TABLET | Freq: Every day | ORAL | Status: DC | PRN

## 2015-07-13 MED ORDER — ALBUTEROL SULFATE HFA 108 (90 BASE) MCG/ACT IN AERS: 2.0000 | INHALATION_SPRAY | Freq: Four times a day (QID) | RESPIRATORY_TRACT | Status: DC | PRN

## 2015-07-13 MED ORDER — GABAPENTIN 100 MG PO CAPS
200.0000 mg | ORAL_CAPSULE | Freq: Three times a day (TID) | ORAL | Status: DC
  Administered 2015-07-13 – 2015-07-14 (×3): 200 mg via ORAL
  Filled 2015-07-13 (×3): qty 2

## 2015-07-13 NOTE — ED Notes (Signed)
Report called to 2A. Was asked to wait until shift change to bring patient upstairs. Agreed.

## 2015-07-13 NOTE — ED Notes (Signed)
Pt reports left breast pain, centralized chest pain, back pain and neck pain since 0400 yesterday morning. Pt denies shortness of breath, nausea, vomiting or dizziness. Pt reports she had colon surgery on the 25th, pt reports the same kind of pain post op and was evaluated for chest pain then, pt hasn't gotten results yet.

## 2015-07-13 NOTE — Progress Notes (Signed)
Pt arrived to floor via stretcher. Patient walked from stretcher to bed with steady gate. Tele placed on patient, O2 at [facility], Patient is alert and oriented. General room orientation given, how to use call bell and ascom instructions given. Fall signs signed and placed on wall. Will continue to monitor patient.   Admission skin assessment; Skin is warm and dry. Surgical incision to mid ABD has healed three steri strips loose but in place. No other skin issues noted. Verifying RN, Lexi.

## 2015-07-13 NOTE — ED Provider Notes (Signed)
[facility] Emergency Department Provider Note  ____________________________________________  Time seen: On arrival  I have reviewed the triage vital signs and the nursing notes.   HISTORY  Chief Complaint Chest Pain    HPI Crystal White is a 74 y.o. female who presents with complaints of chest pain.She reports the pain is in the lower anterior chest. She notes she had recent surgery approximately 3 weeks ago. She does have some baseline shortness of breath from COPD and may be slightly worse. She denies fevers chills. No cough. No nausea vomiting. No diaphoresis     Past Medical History  Diagnosis Date  . Hypertension   . COPD (chronic obstructive pulmonary disease)   . Hyperlipidemia   . OSA (obstructive sleep apnea)   . Presence of permanent cardiac pacemaker     2014- [facility]  . GERD (gastroesophageal reflux disease)   . Stomach ulcer   . Cancer     colon cancer (growth on appendix)    Patient Active Problem List   Diagnosis Date Noted  . Multiple pulmonary nodules 06/02/2015  . Colon polyp 05/29/2015  . Hemoptysis 04/29/2015  . Sprain of ankle 04/14/2015  . Anxiety 04/14/2015  . Barrett's esophagus 04/14/2015  . Acute exacerbation of chronic obstructive airways disease 04/14/2015  . Bursitis 04/14/2015  . Cellulitis 04/14/2015  . Claudication 04/14/2015  . Cough 04/14/2015  . Deficiency, 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  . Clinical depression 04/14/2015  . Dizziness 04/14/2015  . Dyslipidemia 04/14/2015  . Essential (primary) hypertension 04/14/2015  . Flank pain 04/14/2015  . H/O suicide attempt 04/14/2015  . Dysphonia 04/14/2015  . Hypercholesteremia 04/14/2015  . Cannot sleep 04/14/2015  . Malaise and fatigue 04/14/2015  . Mild cognitive impairment 04/14/2015  . Cramp in muscle 04/14/2015  . Muscle ache 04/14/2015  . Neuropathy 04/14/2015  . Adiposity 04/14/2015  . 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 Candida infection of  mouth 04/14/2015  . Abnormal loss of weight 04/14/2015  . Artificial cardiac pacemaker 10/08/2014  . Apnea, sleep 10/08/2014  . COPD (chronic obstructive pulmonary disease) 02/13/2012  . Tobacco abuse 02/13/2012  . Sinus congestion 02/13/2012  . Esophagitis, reflux 08/28/2006  . Abdominal pain, right lower quadrant 08/22/2005  . Acute appendicitis with peritoneal abscess 08/22/2005    Past Surgical History  Procedure Laterality Date  . Total abdominal hysterectomy  1980  . Pacemaker insertion  2012  . Cardiac catheterization  2007  . Hiatal hernia repair  2007  . Colonoscopy with propofol N/A 04/14/2015    Procedure: COLONOSCOPY WITH PROPOFOL;  Surgeon: Lollie Sails, MD;  Location: [facility] ENDOSCOPY;  Service: Endoscopy;  Laterality: N/A;  . Esophagogastroduodenoscopy N/A 04/14/2015    Procedure: ESOPHAGOGASTRODUODENOSCOPY (EGD);  Surgeon: Lollie Sails, MD;  Location: [facility] ENDOSCOPY;  Service: Endoscopy;  Laterality: N/A;  . Appendectomy  2006    Dr. Pat Patrick  . Nissen fundoplication  4403  . Laparoscopic right hemi colectomy Right 06/22/2015    Procedure: LAPAROSCOPIC RIGHT HEMI COLECTOMY;  Surgeon: Marlyce Huge, MD;  Location: [facility] ORS;  Service: General;  Laterality: Right;  . Colon surgery      Current Outpatient Rx  Name  Route  Sig  Dispense  Refill  . acetaminophen (TYLENOL) 500 MG tablet   Oral   Take 2 tablets by mouth every 4 (four) hours as needed for mild pain.          Marland Kitchen albuterol (PROVENTIL HFA) 108 (90 BASE) MCG/ACT inhaler   Inhalation   Inhale 2  puffs into the lungs every 6 (six) hours as needed for wheezing or shortness of breath.          . ALPRAZolam (XANAX) 0.25 MG tablet   Oral   Take 0.25 mg by mouth 2 (two) times daily as needed for anxiety.          . BELSOMRA 10 MG TABS   Oral   Take 10 mg by mouth at bedtime as needed.      5     Dispense as written.   . budesonide-formoterol (SYMBICORT) 160-4.5 MCG/ACT inhaler    Inhalation   Inhale 1 puff into the lungs 2 (two) times daily.         . citalopram (CELEXA) 10 MG tablet   Oral   Take 10 mg by mouth daily.         . fluticasone (FLONASE) 50 MCG/ACT nasal spray   Each Nare   Place 2 sprays into both nostrils daily as needed for rhinitis.          Marland Kitchen gabapentin (NEURONTIN) 100 MG capsule   Oral   Take 200 mg by mouth 3 (three) times daily.          . hydrochlorothiazide (HYDRODIURIL) 25 MG tablet   Oral   Take 25 mg by mouth daily.         Marland Kitchen HYDROcodone-acetaminophen (NORCO/VICODIN) 5-325 MG per tablet   Oral   Take 1-2 tablets by mouth every 6 (six) hours as needed for moderate pain.   30 tablet   0   . hyoscyamine (LEVBID) 0.375 MG 12 hr tablet   Oral   Take 0.375 mg by mouth 2 (two) times daily as needed for cramping.         Marland Kitchen losartan (COZAAR) 50 MG tablet               . MAGNESIUM-OXIDE 400 (241.3 MG) MG tablet      TK 1 T PO BID      12     Dispense as written.   . meclizine (ANTIVERT) 25 MG tablet   Oral   Take 1 tablet by mouth 2 (two) times daily as needed for dizziness.          . mirtazapine (REMERON) 30 MG tablet   Oral   Take 30 mg by mouth at bedtime.         . pantoprazole (PROTONIX) 40 MG tablet   Oral   Take 40 mg by mouth 2 (two) times daily.         . polyethylene glycol powder (GLYCOLAX/MIRALAX) powder      TK 255 GRAMS PO ONCE UTD PER SURGERY PREP INSTRUCTIONS      0   . traZODone (DESYREL) 150 MG tablet   Oral   Take 150 mg by mouth at bedtime.         Marland Kitchen zolpidem (AMBIEN) 5 MG tablet   Oral   Take 5 mg by mouth at bedtime as needed for sleep.           Allergies Contrast media; Lovastatin; and Statins  Family History  Problem Relation Age of Onset  . Adopted: Yes  . Family history unknown: Yes    Social History Social History  Substance Use Topics  . Smoking status: Current Every Day Smoker -- 1.00 packs/day for 55 years    Types: Cigarettes  . Smokeless  tobacco: Never Used  . Alcohol Use: No  Comment: occcasionall 1/2 glass wine a month    Review of Systems  Constitutional: Negative for fever. Eyes: Negative for visual changes. ENT: Negative for sore throat Cardiovascular: Positive for chest pain. Respiratory: Positive for shortness of breath. Gastrointestinal: Negative for abdominal pain, vomiting and diarrhea. Genitourinary: Negative for dysuria. Musculoskeletal: Negative for back pain. Skin: Negative for rash. Neurological: Negative for headaches or focal weakness Psychiatric: No anxiety    ____________________________________________   PHYSICAL EXAM:  VITAL SIGNS: ED Triage Vitals  Enc Vitals Group     BP 07/13/15 1110 113/54 mmHg     Pulse Rate 07/13/15 1110 93     Resp 07/13/15 1110 18     Temp 07/13/15 1110 98.2 F (36.8 C)     Temp Source 07/13/15 1110 Oral     SpO2 07/13/15 1110 93 %     Weight 07/13/15 1110 164 lb (74.39 kg)     Height 07/13/15 1110 5\' 4"  (1.626 m)     Head Cir --      Peak Flow --      Pain Score 07/13/15 1119 8     Pain Loc --      Pain Edu? --      Excl. in [place]? --      Constitutional: Alert and oriented. Well appearing and in no distress. Eyes: Conjunctivae are normal.  ENT   Head: Normocephalic and atraumatic.   Mouth/Throat: Mucous membranes are moist. Cardiovascular: Normal rate, regular rhythm. Normal and symmetric distal pulses are present in all extremities. No murmurs, rubs, or gallops. Respiratory: Normal respiratory effort without tachypnea nor retractions. Breath sounds are clear and equal bilaterally.  Gastrointestinal: Soft and non-tender in all quadrants. No distention. There is no CVA tenderness. Genitourinary: deferred Musculoskeletal: Nontender with normal range of motion in all extremities. No lower extremity tenderness nor edema. Neurologic:  Normal speech and language. No gross focal neurologic deficits are appreciated. Skin:  Skin is warm, dry and  intact. No rash noted. Psychiatric: Mood and affect are normal. Patient exhibits appropriate insight and judgment.  ____________________________________________    LABS (pertinent positives/negatives)  Labs Reviewed  BASIC METABOLIC PANEL - Abnormal; Notable for the following:    Chloride 99 (*)    Glucose, Bld 108 (*)    BUN 28 (*)    Creatinine, Ser 1.68 (*)    GFR calc non Af Amer 29 (*)    GFR calc Af Amer 34 (*)    All other components within normal limits  CBC - Abnormal; Notable for the following:    RDW 15.9 (*)    All other components within normal limits  FIBRIN DERIVATIVES D-DIMER ([facility] ONLY) - Abnormal; Notable for the following:    Fibrin derivatives D-dimer ([facility]) 1819.7 (*)    All other components within normal limits  TROPONIN I    ____________________________________________   EKG  ED ECG REPORT I, Lavonia Drafts, the attending physician, personally viewed and interpreted this ECG.   Date: 07/13/2015  EKG Time: 11:09 AM  Rate: 93  Rhythm: Dual-chamber electronic pacemaker  Axis: Left axis deviation  Intervals:nonspecific intraventricular conduction delay  ST&T Change: Nonspecific   ____________________________________________    RADIOLOGY I have personally reviewed any xrays that were ordered on this patient: Chest x-ray unremarkable  ____________________________________________   PROCEDURES  Procedure(s) performed: none  Critical Care performed: none  ____________________________________________   INITIAL IMPRESSION / ASSESSMENT AND PLAN / ED COURSE  Pertinent labs & imaging results that were available during my care of  the patient were reviewed by me and considered in my medical decision making (see chart for details).  Patient with recent surgery and elevated d-dimer but allergic contrast medium. We will admit the patient for VQ scan and to trend troponins  ____________________________________________   FINAL CLINICAL  IMPRESSION(S) / ED DIAGNOSES  Final diagnoses:  Chest pain, unspecified chest pain type     Lavonia Drafts, MD 07/13/15 1512

## 2015-07-13 NOTE — H&P (Signed)
[facility] [place] NAME: Crystal White    MR#:  [id number]  DATE OF BIRTH:  01-15-1941  DATE OF ADMISSION:  07/13/2015  PRIMARY CARE PHYSICIAN: [name], MD   REQUESTING/REFERRING PHYSICIAN: Dr. Corky Downs  CHIEF COMPLAINT:   Chief Complaint  Patient presents with  . Chest Pain    HISTORY OF PRESENT ILLNESS:  Crystal White  is a 74 y.o. female with a known history of COPD not on any home oxygen, ongoing smoker, gastroesophageal reflux disease and recent admission for ileocecostomy for a sessile polyp around appendiceal orifice about 10 days ago presents to the hospital secondary to left-sided chest pain. Patient states she had chest pain right after her surgery and at that time had EKG and chest x-ray done and everything was clear so was discharged. He was determined to be pain due to gas and was relieved with some Simethicone at the time. She has been doing fine postop up until yesterday when she suddenly experienced severe chest pain all across her chest but starting under her left breast. The pain right now is limited underneath her left breast, tender to touch. No visual changes noted. She does complain of pain on movement and also on taking deep breaths. Denies any nausea or diaphoresis associated with the chest pain. No prior cardiac history, however chest x-ray shows some atherosclerotic changes in the aorta. Also does have a pacemaker due to second-degree AV block. Patient does have chronic dyspnea on exertion and due to her COPD. Oxygen saturations are right about 90 at this time. She has been having worsening of her cough for the last couple of days with mucoid sputum production.   PAST MEDICAL HISTORY:   Past Medical History  Diagnosis Date  . Hypertension   . COPD (chronic obstructive pulmonary disease)   . Hyperlipidemia   . OSA (obstructive sleep apnea)     on CPAP  . Presence of permanent cardiac pacemaker      2014- [facility]  . GERD (gastroesophageal reflux disease)   . Stomach ulcer   . Barrett's esophagus   . Peripheral neuropathy   . Mobitz type II atrioventricular block     PAST SURGICAL HISTORY:   Past Surgical History  Procedure Laterality Date  . Total abdominal hysterectomy  1980  . Pacemaker insertion  2012  . Cardiac catheterization  2007  . Hiatal hernia repair  2007  . Colonoscopy with propofol N/A 04/14/2015    Procedure: COLONOSCOPY WITH PROPOFOL;  Surgeon: Lollie Sails, MD;  Location: [facility] ENDOSCOPY;  Service: Endoscopy;  Laterality: N/A;  . Esophagogastroduodenoscopy N/A 04/14/2015    Procedure: ESOPHAGOGASTRODUODENOSCOPY (EGD);  Surgeon: Lollie Sails, MD;  Location: [facility] ENDOSCOPY;  Service: Endoscopy;  Laterality: N/A;  . Appendectomy  2006    Dr. Pat Patrick  . Nissen fundoplication  [id number]  . Laparoscopic right hemi colectomy Right 06/22/2015    Procedure: LAPAROSCOPIC RIGHT HEMI COLECTOMY;  Surgeon: Marlyce Huge, MD;  Location: [facility] ORS;  Service: General;  Laterality: Right;  . Colon surgery      SOCIAL HISTORY:   Social History  Substance Use Topics  . Smoking status: Current Every Day Smoker -- 1.00 packs/day for 55 years    Types: Cigarettes  . Smokeless tobacco: Never Used  . Alcohol Use: No     Comment: occcasionall 1/2 glass wine a month    FAMILY HISTORY:   Family History  Problem Relation Age of Onset  .  Adopted: Yes  . Family history unknown: Yes    DRUG ALLERGIES:   Allergies  Allergen Reactions  . Contrast Media [Iodinated Diagnostic Agents] Other (See Comments)    Reaction:  Burning and redness   . Statins Other (See Comments)    Reaction:  Leg cramps     REVIEW OF SYSTEMS:   Review of Systems  Constitutional: Negative for fever, chills, weight loss and malaise/fatigue.  HENT: Negative for ear discharge, ear pain, hearing loss, nosebleeds and tinnitus.   Eyes: Negative for blurred vision, double vision and photophobia.   Respiratory: Positive for cough and shortness of breath. Negative for hemoptysis and wheezing.   Cardiovascular: Positive for chest pain. Negative for palpitations, orthopnea and leg swelling.  Gastrointestinal: Positive for heartburn. Negative for nausea, vomiting, abdominal pain, diarrhea, constipation and melena.  Genitourinary: Negative for dysuria, urgency, frequency and hematuria.  Musculoskeletal: Negative for myalgias, back pain and neck pain.  Skin: Negative for rash.  Neurological: Negative for dizziness, tingling, tremors, sensory change, speech change, focal weakness and headaches.       Peripheral neuropathy in both feet.  Endo/Heme/Allergies: Does not bruise/bleed easily.  Psychiatric/Behavioral: Negative for depression.    MEDICATIONS AT HOME:   Prior to Admission medications   Medication Sig Start Date End Date Taking? Authorizing Provider  acetaminophen (TYLENOL) 500 MG tablet Take 1,000 mg by mouth every 4 (four) hours as needed for mild pain or headache.    Yes Historical Provider, MD  albuterol (PROVENTIL HFA) 108 (90 BASE) MCG/ACT inhaler Inhale 2 puffs into the lungs every 6 (six) hours as needed for wheezing or shortness of breath.    Yes Historical Provider, MD  ALPRAZolam (XANAX) 0.25 MG tablet Take 0.25 mg by mouth 2 (two) times daily as needed for anxiety.    Yes Historical Provider, MD  budesonide-formoterol (SYMBICORT) 160-4.5 MCG/ACT inhaler Inhale 2 puffs into the lungs 2 (two) times daily.    Yes Historical Provider, MD  citalopram (CELEXA) 10 MG tablet Take 10 mg by mouth at bedtime.    Yes Historical Provider, MD  fluticasone (FLONASE) 50 MCG/ACT nasal spray Place 2 sprays into both nostrils daily as needed for rhinitis.    Yes Historical Provider, MD  gabapentin (NEURONTIN) 100 MG capsule Take 200 mg by mouth 3 (three) times daily.    Yes Historical Provider, MD  hydrochlorothiazide (HYDRODIURIL) 25 MG tablet Take 25 mg by mouth daily.   Yes Historical  Provider, MD  hyoscyamine (LEVBID) 0.375 MG 12 hr tablet Take 0.375 mg by mouth 2 (two) times daily as needed for cramping.   Yes Historical Provider, MD  losartan (COZAAR) 50 MG tablet Take 50 mg by mouth at bedtime.   Yes Historical Provider, MD  magnesium oxide (MAG-OX) 400 MG tablet Take 400 mg by mouth 2 (two) times daily.   Yes Historical Provider, MD  meclizine (ANTIVERT) 25 MG tablet Take 1 tablet by mouth 2 (two) times daily as needed for dizziness.  10/23/13  Yes Historical Provider, MD  mirtazapine (REMERON) 30 MG tablet Take 30 mg by mouth at bedtime.   Yes Historical Provider, MD  pantoprazole (PROTONIX) 40 MG tablet Take 40 mg by mouth 2 (two) times daily.   Yes Historical Provider, MD  simethicone (MYLICON) 80 MG chewable tablet Chew 160 mg by mouth 2 (two) times daily as needed for flatulence.   Yes Historical Provider, MD  Suvorexant (BELSOMRA) 10 MG TABS Take 10 mg by mouth at bedtime as needed (for sleep).  Yes Historical Provider, MD  traZODone (DESYREL) 150 MG tablet Take 150 mg by mouth at bedtime.   Yes Historical Provider, MD  zolpidem (AMBIEN) 5 MG tablet Take 5 mg by mouth at bedtime as needed for sleep.   Yes Historical Provider, MD  HYDROcodone-acetaminophen (NORCO/VICODIN) 5-325 MG per tablet Take 1-2 tablets by mouth every 6 (six) hours as needed for moderate pain. Patient not taking: Reported on 07/13/2015 06/26/15   Marlyce Huge, MD      VITAL SIGNS:  Blood pressure 116/52, pulse 94, temperature 98.2 F (36.8 C), temperature source Oral, resp. rate 18, height 5\' 4"  (1.626 m), weight 74.39 kg (164 lb), SpO2 91 %.  PHYSICAL EXAMINATION:   Physical Exam  GENERAL:  74 y.o.-year-old patient lying in the bed with no acute distress.  EYES: Pupils equal, round, reactive to light and accommodation. No scleral icterus. Extraocular muscles intact. Postsurgical pupils due to cataract surgery. HEENT: Head atraumatic, normocephalic. Oropharynx and nasopharynx  clear.  NECK:  Supple, no jugular venous distention. No thyroid enlargement, no tenderness.  LUNGS: No use of accessory muscles for breathing. Scant breath sounds bilaterally with scattered wheeze. No crackles heard.  CARDIOVASCULAR: S1, S2 normal. No murmurs, rubs, or gallops.  ABDOMEN: Soft,nondistended. Bowel sounds present. No organomegaly or mass.  Steri-Strips from recent surgery present on the abdomen, with some tenderness in the right lower quadrant. No guarding or rigidity. EXTREMITIES: No pedal edema, cyanosis, or clubbing.  NEUROLOGIC: Cranial nerves II through XII are intact. Muscle strength 5/5 in all extremities. Sensation intact. Gait not checked. Decreased sensation both feet due to peripheral neuropathy which is chronic. PSYCHIATRIC: The patient is alert and oriented x 3.  SKIN: No obvious rash, lesion, or ulcer.   LABORATORY PANEL:   CBC  Recent Labs Lab 07/13/15 1116  WBC 10.3  HGB 14.5  HCT 44.8  PLT 323   ------------------------------------------------------------------------------------------------------------------  Chemistries   Recent Labs Lab 07/13/15 1116  NA 139  K 3.9  CL 99*  CO2 29  GLUCOSE 108*  BUN 28*  CREATININE 1.68*  CALCIUM 9.0   ------------------------------------------------------------------------------------------------------------------  Cardiac Enzymes  Recent Labs Lab 07/13/15 1116  TROPONINI <0.03   ------------------------------------------------------------------------------------------------------------------  RADIOLOGY:  Dg Chest 2 View  07/13/2015   CLINICAL DATA:  Left-sided chest pain  EXAM: CHEST  2 VIEW  COMPARISON:  June 24, 2015 chest radiograph and April 29, 2015 chest CT  FINDINGS: There is no edema or consolidation. Heart size and pulmonary vascularity are normal. Pacemaker leads are attached to the right atrium right ventricle. No pneumothorax. No adenopathy. There is atherosclerotic change in the aorta.  No bone lesions.  IMPRESSION: No edema or consolidation.   Electronically Signed   By: Lowella Grip III M.D.   On: 07/13/2015 11:45    EKG:   Orders placed or performed during the hospital encounter of 07/13/15  . ED EKG within 10 minutes  . ED EKG within 10 minutes    IMPRESSION AND PLAN:   Crystal White  is a 74 y.o. female with a known history of COPD not on any home oxygen, ongoing smoker, gastroesophageal reflux disease and recent admission for ileocecostomy for a sessile polyp around appendiceal orifice about 10 days ago presents to the hospital secondary to left-sided chest pain.  #1 chest pain-could be musculoskeletal chest pain as it's very positional and tender to touch. -However due to her underlying cough sats on the lower side and recent surgery cannot rule out a pulmonary embolism. -Due to  her renal failure and also allergy to contrast dye will get a VQ scan. -Placed on 2 L oxygen and also check ambulatory sats prior to discharge. -Muscle relaxants and pain medications. -Less likely to be cardiac. EKG with no acute changes. We will recycle troponins. -Monitor on telemetry.  #2 acute renal failure-likely has underlying CK D with her GFR always in 40s, now decreased to 28. -Hold her losartan. Gentle IV hydration and monitor. -If no improvement will get a renal ultrasound.  #3 COPD and obstructive sleep apnea-placed on oxygen as sats are ranging from 87-90% - Check ambulatory sats. Continue her home inhalers. No acute indication for IV steroids at this time. -Chest x-ray with no evidence of bronchitis or pneumonia at this time. -Continue CPAP  #4 hypertension-hold losartan and hydrochlorothiazide Monitor blood pressure at this time, if needed add medications  #5 DVT prophylaxis-subcutaneous heparin at this time.  #6 gastroesophageal reflux disease-continue home medications. Follows with Dr. Gustavo Lah.  All the records are reviewed and case discussed with ED  provider. Management plans discussed with the patient, family and they are in agreement.  CODE STATUS: Full code  TOTAL TIME TAKING CARE OF THIS PATIENT: 55 minutes.    Gladstone Lighter M.D on 07/13/2015 at 3:13 PM  Between 7am to 6pm - Pager - [phone]  After 6pm go to [email or web address] - password [email or web address] [email or web address]  Office  [phone]  CC: Primary care physician; [name], MD

## 2015-07-13 NOTE — Telephone Encounter (Signed)
Patient reports that she is having chest pain, shortness of breath that started on 4 am yesterday. Patient reports that she had laparoscopic ileocecectomy procedure done on 06/22/15 by Dr. Rexene Edison. Patient reports that on that day pt had similar symptoms and reports that x-ray, labs and EKG were done. Patient reports that she has taken Gas-X and pain medication that were prescribed by Dr. Rexene Edison. Patient reports that her symptoms today have decreased a little. I advised pt to go to [facility] ER today and not to wait till 4:30 pm appointment today with Sonia Baller. Patient reports that her son will take her to the ER.  Call back # [phone]

## 2015-07-14 ENCOUNTER — Telehealth: Payer: Self-pay | Admitting: Family Medicine

## 2015-07-14 DIAGNOSIS — F17200 Nicotine dependence, unspecified, uncomplicated: Secondary | ICD-10-CM | POA: Diagnosis not present

## 2015-07-14 DIAGNOSIS — J209 Acute bronchitis, unspecified: Secondary | ICD-10-CM | POA: Diagnosis not present

## 2015-07-14 DIAGNOSIS — J438 Other emphysema: Secondary | ICD-10-CM

## 2015-07-14 DIAGNOSIS — R079 Chest pain, unspecified: Secondary | ICD-10-CM | POA: Diagnosis not present

## 2015-07-14 DIAGNOSIS — J9611 Chronic respiratory failure with hypoxia: Secondary | ICD-10-CM | POA: Diagnosis not present

## 2015-07-14 DIAGNOSIS — R0789 Other chest pain: Secondary | ICD-10-CM

## 2015-07-14 DIAGNOSIS — J9610 Chronic respiratory failure, unspecified whether with hypoxia or hypercapnia: Secondary | ICD-10-CM

## 2015-07-14 DIAGNOSIS — N189 Chronic kidney disease, unspecified: Secondary | ICD-10-CM

## 2015-07-14 DIAGNOSIS — Z720 Tobacco use: Secondary | ICD-10-CM | POA: Diagnosis not present

## 2015-07-14 LAB — CBC
HEMATOCRIT: 40.3 % (ref 35.0–47.0)
Hemoglobin: 12.8 g/dL (ref 12.0–16.0)
MCH: 27.5 pg (ref 26.0–34.0)
MCHC: 31.7 g/dL — AB (ref 32.0–36.0)
MCV: 86.9 fL (ref 80.0–100.0)
PLATELETS: 222 10*3/uL (ref 150–440)
RBC: 4.63 MIL/uL (ref 3.80–5.20)
RDW: 15.4 % — AB (ref 11.5–14.5)
WBC: 6 10*3/uL (ref 3.6–11.0)

## 2015-07-14 LAB — CKMB (ARMC ONLY)
CK, MB: 1.1 ng/mL (ref 0.5–5.0)
CK, MB: 1.3 ng/mL (ref 0.5–5.0)

## 2015-07-14 LAB — TROPONIN I

## 2015-07-14 LAB — BASIC METABOLIC PANEL
Anion gap: 7 (ref 5–15)
BUN: 24 mg/dL — AB (ref 6–20)
CHLORIDE: 105 mmol/L (ref 101–111)
CO2: 30 mmol/L (ref 22–32)
CREATININE: 1.31 mg/dL — AB (ref 0.44–1.00)
Calcium: 8.4 mg/dL — ABNORMAL LOW (ref 8.9–10.3)
GFR calc Af Amer: 46 mL/min — ABNORMAL LOW (ref >60)
GFR calc non Af Amer: 40 mL/min — ABNORMAL LOW (ref >60)
GLUCOSE: 103 mg/dL — AB (ref 65–99)
POTASSIUM: 3.6 mmol/L (ref 3.5–5.1)
Sodium: 142 mmol/L (ref 135–145)

## 2015-07-14 MED ORDER — AZITHROMYCIN 250 MG PO TABS
500.0000 mg | ORAL_TABLET | Freq: Every day | ORAL | Status: AC
  Administered 2015-07-14: 500 mg via ORAL
  Filled 2015-07-14: qty 2

## 2015-07-14 MED ORDER — NICOTINE 21 MG/24HR TD PT24: 21.0000 mg | MEDICATED_PATCH | Freq: Every day | TRANSDERMAL | Status: DC

## 2015-07-14 MED ORDER — HYDROCOD POLST-CPM POLST ER 10-8 MG/5ML PO SUER
5.0000 mL | Freq: Two times a day (BID) | ORAL | Status: DC
Start: 2015-07-14 — End: 2015-08-10

## 2015-07-14 MED ORDER — AZITHROMYCIN 250 MG PO TABS: 250.0000 mg | ORAL_TABLET | Freq: Every day | ORAL | Status: DC

## 2015-07-14 MED ORDER — NICOTINE 10 MG IN INHA: 1.0000 | RESPIRATORY_TRACT | Status: DC | PRN

## 2015-07-14 MED ORDER — ALBUTEROL SULFATE (2.5 MG/3ML) 0.083% IN NEBU: 2.5000 mg | INHALATION_SOLUTION | Freq: Four times a day (QID) | RESPIRATORY_TRACT | Status: DC | PRN

## 2015-07-14 MED ORDER — NICOTINE 10 MG IN INHA
1.0000 | RESPIRATORY_TRACT | Status: DC | PRN
Start: 2015-07-14 — End: 2015-07-14
  Filled 2015-07-14: qty 36

## 2015-07-14 MED ORDER — GUAIFENESIN ER 600 MG PO TB12: 600.0000 mg | ORAL_TABLET | Freq: Two times a day (BID) | ORAL | Status: DC

## 2015-07-14 MED ORDER — AZITHROMYCIN 250 MG PO TABS: ORAL_TABLET | ORAL | Status: DC

## 2015-07-14 MED ORDER — HYDROCODONE-ACETAMINOPHEN 5-325 MG PO TABS: 1.0000 | ORAL_TABLET | Freq: Four times a day (QID) | ORAL | Status: DC | PRN

## 2015-07-14 NOTE — Progress Notes (Signed)
Patient is discharge home in a stable condition , denies pain at time of discharge, summary and f/u care givwen to pt and son, verbalized understanding , left with son

## 2015-07-14 NOTE — Progress Notes (Signed)
SATURATION QUALIFICATIONS: (This note is used to comply with regulatory documentation for home oxygen)  Patient Saturations on Room Air at Rest = 88%  Patient Saturations on Room Air while Ambulating =85 %  Patient Saturations on 2Liters of oxygen while Ambulating =92 %  Please briefly explain why patient needs home oxygen:COPD

## 2015-07-14 NOTE — Discharge Summary (Signed)
[name] NAME: Crystal White    MR#:  [id number]  DATE OF BIRTH:  05-02-1941  DATE OF ADMISSION:  07/13/2015 ADMITTING PHYSICIAN: Gladstone Lighter, MD  DATE OF DISCHARGE: 07/14/2015  3:56 PM  PRIMARY CARE PHYSICIAN: [name], MD     ADMISSION DIAGNOSIS:  Chest pain [R07.9] Chest pain, unspecified chest pain type [R07.9]  DISCHARGE DIAGNOSIS:  Active Problems:   Chest pain   Musculoskeletal chest pain   Chronic respiratory failure   Acute bronchitis   Other emphysema   Chronic renal insufficiency   SECONDARY DIAGNOSIS:   Past Medical History  Diagnosis Date  . Hypertension   . COPD (chronic obstructive pulmonary disease)   . Hyperlipidemia   . OSA (obstructive sleep apnea)     on CPAP  . Presence of permanent cardiac pacemaker     2014- [facility]  . GERD (gastroesophageal reflux disease)   . Stomach ulcer   . Barrett's esophagus   . Peripheral neuropathy   . Mobitz type II atrioventricular block     .pro HOSPITAL COURSE:   Patient is 74 year old Caucasian female with past medical history significant for history of chronic respiratory failure who was on oxygen therapy in the past,  Ongoing tobacco abuse who presents to the hospital with complaints of chest pains in the left side of the chest, associated with cough, also some cream-colored sputum difficult to expel. She was admitted to the hospital and her cardiac enzymes were cycled. However, they showed no cardiac injury. Since patient's d-dimer was elevated. Patient underwent VQ scanning, which was low probability for PE. Marland Kitchen On arrival to the hospital. She was hypoxic requiring oxygen therapy. It was felt that patient's hypoxia is contributing factor to your most" tall chest pains since patient's pain was clearly reproducible Discussion by problem 1. Muscular skeletal  chest pain, likely due to cough,. Low probability for PE. Cardiac enzymes are  negative, patient was advised to continue pain medications and treated her acute bronchitis follow-up with her primary care physician for further cardiology consultation as needed as outpatient 2. Acute bronchitis. Get sputum cultures. Initiate patient on Zithromax as well as inhalation therapy with DuoNeb nebs. Continue Symbicort 3. Chronic respiratory failure, no pulmonary embolism, patient will be discharged on oxygen therapy at 2 L of oxygen through nasal cannulas, she was prequalified in the hospital 4. Chronic renal insufficiency seemed to be stable with therapy 5. History of hypertension, essential seemed to be well controlled 6. Tobacco abuse. Discussed this patient for approximately 5 minutes. Nicotine replacement therapy will be initiated for which patient was agreeable. Calculated lifetime tobacco expenditures exceeded $70,000 DISCHARGE CONDITIONS:   Stable  CONSULTS OBTAINED:  Treatment Team:  Isaias Cowman, MD  DRUG ALLERGIES:   Allergies  Allergen Reactions  . Contrast Media [Iodinated Diagnostic Agents] Other (See Comments)    Reaction:  Burning and redness   . Statins Other (See Comments)    Reaction:  Leg cramps     DISCHARGE MEDICATIONS:   Discharge Medication List as of 07/14/2015  3:07 PM    START taking these medications   Details  albuterol (PROVENTIL) (2.5 MG/3ML) 0.083% nebulizer solution Take 3 mLs (2.5 mg total) by nebulization every 6 (six) hours as needed for wheezing or shortness of breath., Starting 07/14/2015, Until Discontinued, Normal    azithromycin (ZITHROMAX) 250 MG tablet Take daily starting 17th of August 2016 for 4 days, Normal    chlorpheniramine-HYDROcodone (TUSSIONEX PENNKINETIC  ER) 10-8 MG/5ML SUER Take 5 mLs by mouth 2 (two) times daily., Starting 07/14/2015, Until Discontinued, Normal    guaiFENesin (MUCINEX) 600 MG 12 hr tablet Take 1 tablet (600 mg total) by mouth 2 (two) times daily., Starting 07/14/2015, Until Discontinued, Normal     !! HYDROcodone-acetaminophen (NORCO/VICODIN) 5-325 MG per tablet Take 1 tablet by mouth every 6 (six) hours as needed for moderate pain or severe pain., Starting 07/14/2015, Until Discontinued, Print    nicotine (NICODERM CQ - DOSED IN MG/24 HOURS) 21 mg/24hr patch Place 1 patch (21 mg total) onto the skin daily., Starting 07/14/2015, Until Discontinued, Normal    nicotine (NICOTROL) 10 MG inhaler Inhale 1 cartridge (1 continuous puffing total) into the lungs as needed for smoking cessation., Starting 07/14/2015, Until Discontinued, Normal     !! - Potential duplicate medications found. Please discuss with provider.    CONTINUE these medications which have NOT CHANGED   Details  acetaminophen (TYLENOL) 500 MG tablet Take 1,000 mg by mouth every 4 (four) hours as needed for mild pain or headache. , Until Discontinued, Historical Med    albuterol (PROVENTIL HFA) 108 (90 BASE) MCG/ACT inhaler Inhale 2 puffs into the lungs every 6 (six) hours as needed for wheezing or shortness of breath. , Until Discontinued, Historical Med    ALPRAZolam (XANAX) 0.25 MG tablet Take 0.25 mg by mouth 2 (two) times daily as needed for anxiety. , Until Discontinued, Historical Med    budesonide-formoterol (SYMBICORT) 160-4.5 MCG/ACT inhaler Inhale 2 puffs into the lungs 2 (two) times daily. , Until Discontinued, Historical Med    citalopram (CELEXA) 10 MG tablet Take 10 mg by mouth at bedtime. , Until Discontinued, Historical Med    fluticasone (FLONASE) 50 MCG/ACT nasal spray Place 2 sprays into both nostrils daily as needed for rhinitis. , Until Discontinued, Historical Med    gabapentin (NEURONTIN) 100 MG capsule Take 200 mg by mouth 3 (three) times daily. , Until Discontinued, Historical Med    hydrochlorothiazide (HYDRODIURIL) 25 MG tablet Take 25 mg by mouth daily., Until Discontinued, Historical Med    hyoscyamine (LEVBID) 0.375 MG 12 hr tablet Take 0.375 mg by mouth 2 (two) times daily as needed for  cramping., Until Discontinued, Historical Med    losartan (COZAAR) 50 MG tablet Take 50 mg by mouth at bedtime., Until Discontinued, Historical Med    magnesium oxide (MAG-OX) 400 MG tablet Take 400 mg by mouth 2 (two) times daily., Until Discontinued, Historical Med    meclizine (ANTIVERT) 25 MG tablet Take 1 tablet by mouth 2 (two) times daily as needed for dizziness. , Starting 10/23/2013, Until Discontinued, Historical Med    mirtazapine (REMERON) 30 MG tablet Take 30 mg by mouth at bedtime., Until Discontinued, Historical Med    pantoprazole (PROTONIX) 40 MG tablet Take 40 mg by mouth 2 (two) times daily., Until Discontinued, Historical Med    simethicone (MYLICON) 80 MG chewable tablet Chew 160 mg by mouth 2 (two) times daily as needed for flatulence., Until Discontinued, Historical Med    Suvorexant (BELSOMRA) 10 MG TABS Take 10 mg by mouth at bedtime as needed (for sleep)., Until Discontinued, Historical Med    traZODone (DESYREL) 150 MG tablet Take 150 mg by mouth at bedtime., Until Discontinued, Historical Med    zolpidem (AMBIEN) 5 MG tablet Take 5 mg by mouth at bedtime as needed for sleep., Until Discontinued, Historical Med    !! HYDROcodone-acetaminophen (NORCO/VICODIN) 5-325 MG per tablet Take 1-2 tablets by mouth  every 6 (six) hours as needed for moderate pain., Starting 06/26/2015, Until Discontinued, Print     !! - Potential duplicate medications found. Please discuss with provider.       DISCHARGE INSTRUCTIONS:    Follow-up with primary care physician, Dr. Rosanna Randy in 2-3 days after discharge, follow-up with pulmonologist, Dr. Humphrey Rolls in one week, follow-up with Dr. Saralyn Pilar in 1 week  If you experience worsening of your admission symptoms, develop shortness of breath, life threatening emergency, suicidal or homicidal thoughts you must seek medical attention immediately by calling 911 or calling your MD immediately  if symptoms less severe.  You Must read complete  instructions/literature along with all the possible adverse reactions/side effects for all the Medicines you take and that have been prescribed to you. Take any new Medicines after you have completely understood and accept all the possible adverse reactions/side effects.   Please note  You were cared for by a hospitalist during your hospital stay. If you have any questions about your discharge medications or the care you received while you were in the hospital after you are discharged, you can call the unit and asked to speak with the hospitalist on call if the hospitalist that took care of you is not available. Once you are discharged, your primary care physician will handle any further medical issues. Please note that NO REFILLS for any discharge medications will be authorized once you are discharged, as it is imperative that you return to your primary care physician (or establish a relationship with a primary care physician if you do not have one) for your aftercare needs so that they can reassess your need for medications and monitor your lab values.    Today   CHIEF COMPLAINT:   Chief Complaint  Patient presents with  . Chest Pain    HISTORY OF PRESENT ILLNESS:  Crystal White  is a 74 y.o. female with a known history of chronic respiratory failure who was on oxygen therapy in the past,  ongoing tobacco abuse who presents to the hospital with complaints of chest pains in the left side of the chest, associated with cough, also some cream-colored sputum difficult to expel. She was admitted to the hospital and her cardiac enzymes were cycled. However, they showed no cardiac injury. Since patient's d-dimer was elevated. Patient underwent VQ scanning, which was low probability for PE. Marland Kitchen On arrival to the hospital. She was hypoxic requiring oxygen therapy. It was felt that patient's hypoxia is contributing factor to your most" tall chest pains since patient's pain was clearly reproducible Discussion  by problem 1. Muscular skeletal  chest pain, likely due to cough,. Low probability for PE. Cardiac enzymes are negative, patient was advised to continue pain medications and treated her acute bronchitis follow-up with her primary care physician for further cardiology consultation as needed as outpatient 2. Acute bronchitis. Get sputum cultures. Initiate patient on Zithromax as well as inhalation therapy with DuoNeb nebs. Continue Symbicort 3. Chronic respiratory failure, no pulmonary embolism, patient will be discharged on oxygen therapy at 2 L of oxygen through nasal cannulas, she was prequalified in the hospital 4. Chronic renal insufficiency seemed to be stable with therapy 5. History of hypertension, essential seemed to be well controlled 6. Tobacco abuse. Discussed this patient for approximately 5 minutes. Nicotine replacement therapy will be initiated for which patient was agreeable. Calculated lifetime tobacco expenditures exceeded $70,000   VITAL SIGNS:  Blood pressure 124/57, pulse 68, temperature 97.9 F (36.6 C), temperature source Oral,  resp. rate 20, height 5\' 4"  (1.626 m), weight 74.39 kg (164 lb), SpO2 92 %.  I/O:   Intake/Output Summary (Last 24 hours) at 07/14/15 1651 Last data filed at 07/14/15 1117  Gross per 24 hour  Intake   1600 ml  Output   1350 ml  Net    250 ml    PHYSICAL EXAMINATION:  GENERAL:  74 y.o.-year-old patient lying in the bed with no acute distress.  EYES: Pupils equal, round, reactive to light and accommodation. No scleral icterus. Extraocular muscles intact.  HEENT: Head atraumatic, normocephalic. Oropharynx and nasopharynx clear.  NECK:  Supple, no jugular venous distention. No thyroid enlargement, no tenderness.  LUNGS: Normal breath sounds bilaterally, a few rails, but no crepitations were noted. No use of accessory muscles of respiration.  CARDIOVASCULAR: S1, S2 normal. No murmurs, rubs, or gallops.  ABDOMEN: Soft, non-tender, non-distended.  Bowel sounds present. No organomegaly or mass.  EXTREMITIES: No pedal edema, cyanosis, or clubbing.  NEUROLOGIC: Cranial nerves II through XII are intact. Muscle strength 5/5 in all extremities. Sensation intact. Gait not checked.  PSYCHIATRIC: The patient is alert and oriented x 3.  SKIN: No obvious rash, lesion, or ulcer.   DATA REVIEW:   CBC  Recent Labs Lab 07/14/15 0419  WBC 6.0  HGB 12.8  HCT 40.3  PLT 222    Chemistries   Recent Labs Lab 07/14/15 0419  NA 142  K 3.6  CL 105  CO2 30  GLUCOSE 103*  BUN 24*  CREATININE 1.31*  CALCIUM 8.4*    Cardiac Enzymes  Recent Labs Lab 07/14/15 0419  TROPONINI <0.03    Microbiology Results  Results for orders placed or performed during the hospital encounter of 07/13/15  Culture, expectorated sputum-assessment     Status: None (Preliminary result)   Collection Time: 07/14/15 12:50 PM  Result Value Ref Range Status   Specimen Description SPUTUM  Final   Special Requests Normal  Final   Sputum evaluation THIS SPECIMEN IS ACCEPTABLE FOR SPUTUM CULTURE  Final   Report Status PENDING  Incomplete    RADIOLOGY:  Dg Chest 2 View  07/13/2015   CLINICAL DATA:  Left-sided chest pain  EXAM: CHEST  2 VIEW  COMPARISON:  June 24, 2015 chest radiograph and April 29, 2015 chest CT  FINDINGS: There is no edema or consolidation. Heart size and pulmonary vascularity are normal. Pacemaker leads are attached to the right atrium right ventricle. No pneumothorax. No adenopathy. There is atherosclerotic change in the aorta. No bone lesions.  IMPRESSION: No edema or consolidation.   Electronically Signed   By: Lowella Grip III M.D.   On: 07/13/2015 11:45   Nm Pulmonary Perf And Vent  07/13/2015   CLINICAL DATA:  Shortness of breath since bowel surgery 06/22/2015. History of COPD. No previous pulmonary emboli. General fatigue.  EXAM: NUCLEAR MEDICINE VENTILATION - PERFUSION LUNG SCAN  TECHNIQUE: Ventilation images were obtained in multiple  projections using inhaled aerosol Tc-58m DTPA. Perfusion images were obtained in multiple projections after intravenous injection of Tc-73m MAA.  RADIOPHARMACEUTICALS:  43.52 mCi Technetium-34m DTPA aerosol inhalation and 4.27 mCi Technetium-54m MAA IV  COMPARISON:  04/24/2015 as well as chest CT 04/29/2015 and chest x-ray today.  FINDINGS: Ventilation: Examination demonstrates diffuse heterogeneous uptake with airway clumping and several segmental and nonsegmental defects bilaterally.  Perfusion: There is heterogeneity with matched segmental nonsegmental defects. The heterogeneity is less pronounced compared to the ventilation images.  Chest x-ray today demonstrates emphysematous disease without focal  abnormality.  IMPRESSION: Heterogeneity with matched segmental and nonsegmental defects compatible with known COPD. No significant chest radiograph abnormality. Findings compatible with low probability for pulmonary embolism.   Electronically Signed   By: Marin Olp M.D.   On: 07/13/2015 18:50    EKG:   Orders placed or performed during the hospital encounter of 07/13/15  . ED EKG within 10 minutes  . ED EKG within 10 minutes      Management plans discussed with the patient, family and they are in agreement.  CODE STATUS:     Code Status Orders        Start     Ordered   07/13/15 2028  Full code   Continuous     07/13/15 2027    Advance Directive Documentation        Most Recent Value   Type of Advance Directive  Healthcare Power of Attorney, Living will   Pre-existing out of facility DNR order (yellow form or pink MOST form)     "MOST" Form in Place?        TOTAL TIME TAKING CARE OF THIS PATIENT: 40 minutes.    Theodoro Grist M.D on 07/14/2015 at 4:51 PM  Between 7am to 6pm - Pager - [phone]  After 6pm go to [email or web address] - password [email or web address] [facility]  Office  [phone]  CC: Primary care physician; [name], MD

## 2015-07-14 NOTE — Consult Note (Signed)
[facility] Cardiology  CARDIOLOGY CONSULT NOTE  Patient ID: Crystal White MRN: [id number] DOB/AGE: 1941-05-10 74 y.o.  Admit date: 07/13/2015 Referring Physician [facility] Primary Physician [name] M.D. Primary Cardiologist Miquel Dunn M.D. Reason for Consultation chest pain  HPI: 74 year old female referred for evaluation of chest pain. The patient is status post ileocecostomy approximately 10 days ago. Patient has been experiencing left-sided chest discomfort, unrelated to exertion, septated by cough. Patient presented to [facility] emergency room where EKG was nondiagnostic. She was admitted to telemetry where she has ruled out for myocardial infarction by CPK isoenzymes and troponin. The patient reports her chest pain syndrome has improved.  The patient has known chronic exertional dyspnea with underlying COPD and ongoing tobacco abuse. The patient does complain of recent cough with productive sputum consistent with rhonchi this.  Review of systems complete and found to be negative unless listed above     Past Medical History  Diagnosis Date  . Hypertension   . COPD (chronic obstructive pulmonary disease)   . Hyperlipidemia   . OSA (obstructive sleep apnea)     on CPAP  . Presence of permanent cardiac pacemaker     2014- [facility]  . GERD (gastroesophageal reflux disease)   . Stomach ulcer   . Barrett's esophagus   . Peripheral neuropathy   . Mobitz type II atrioventricular block     Past Surgical History  Procedure Laterality Date  . Total abdominal hysterectomy  1980  . Pacemaker insertion  2012  . Cardiac catheterization  2007  . Hiatal hernia repair  2007  . Colonoscopy with propofol N/A 04/14/2015    Procedure: COLONOSCOPY WITH PROPOFOL;  Surgeon: Lollie Sails, MD;  Location: [facility] ENDOSCOPY;  Service: Endoscopy;  Laterality: N/A;  . Esophagogastroduodenoscopy N/A 04/14/2015    Procedure: ESOPHAGOGASTRODUODENOSCOPY (EGD);  Surgeon: Lollie Sails, MD;  Location: [facility]  ENDOSCOPY;  Service: Endoscopy;  Laterality: N/A;  . Appendectomy  2006    Dr. Pat Patrick  . Nissen fundoplication  7902  . Laparoscopic right hemi colectomy Right 06/22/2015    Procedure: LAPAROSCOPIC RIGHT HEMI COLECTOMY;  Surgeon: Marlyce Huge, MD;  Location: [facility] ORS;  Service: General;  Laterality: Right;  . Colon surgery      Prescriptions prior to admission  Medication Sig Dispense Refill Last Dose  . acetaminophen (TYLENOL) 500 MG tablet Take 1,000 mg by mouth every 4 (four) hours as needed for mild pain or headache.    Past Week at Unknown time  . albuterol (PROVENTIL HFA) 108 (90 BASE) MCG/ACT inhaler Inhale 2 puffs into the lungs every 6 (six) hours as needed for wheezing or shortness of breath.    Past Month at Unknown time  . ALPRAZolam (XANAX) 0.25 MG tablet Take 0.25 mg by mouth 2 (two) times daily as needed for anxiety.    07/12/2015 at Unknown time  . budesonide-formoterol (SYMBICORT) 160-4.5 MCG/ACT inhaler Inhale 2 puffs into the lungs 2 (two) times daily.    07/12/2015 at Unknown time  . citalopram (CELEXA) 10 MG tablet Take 10 mg by mouth at bedtime.    07/12/2015 at Unknown time  . fluticasone (FLONASE) 50 MCG/ACT nasal spray Place 2 sprays into both nostrils daily as needed for rhinitis.    PRN at PRN  . gabapentin (NEURONTIN) 100 MG capsule Take 200 mg by mouth 3 (three) times daily.    07/13/2015 at Unknown time  . hydrochlorothiazide (HYDRODIURIL) 25 MG tablet Take 25 mg by mouth daily.   07/13/2015 at Unknown time  .  hyoscyamine (LEVBID) 0.375 MG 12 hr tablet Take 0.375 mg by mouth 2 (two) times daily as needed for cramping.   PRN at PRN  . losartan (COZAAR) 50 MG tablet Take 50 mg by mouth at bedtime.   07/12/2015 at Unknown time  . magnesium oxide (MAG-OX) 400 MG tablet Take 400 mg by mouth 2 (two) times daily.   Past Week at Unknown time  . meclizine (ANTIVERT) 25 MG tablet Take 1 tablet by mouth 2 (two) times daily as needed for dizziness.    PRN at PRN  . mirtazapine  (REMERON) 30 MG tablet Take 30 mg by mouth at bedtime.   07/12/2015 at Unknown time  . pantoprazole (PROTONIX) 40 MG tablet Take 40 mg by mouth 2 (two) times daily.   07/13/2015 at Unknown time  . simethicone (MYLICON) 80 MG chewable tablet Chew 160 mg by mouth 2 (two) times daily as needed for flatulence.   07/12/2015 at Unknown time  . Suvorexant (BELSOMRA) 10 MG TABS Take 10 mg by mouth at bedtime as needed (for sleep).   07/12/2015 at Unknown time  . traZODone (DESYREL) 150 MG tablet Take 150 mg by mouth at bedtime.   07/12/2015 at Unknown time  . zolpidem (AMBIEN) 5 MG tablet Take 5 mg by mouth at bedtime as needed for sleep.   Past Week at Unknown time  . HYDROcodone-acetaminophen (NORCO/VICODIN) 5-325 MG per tablet Take 1-2 tablets by mouth every 6 (six) hours as needed for moderate pain. (Patient not taking: Reported on 07/13/2015) 30 tablet 0 Taking   Social History   Social History  . Marital Status: Widowed    Spouse Name: widowed  . Number of Children: Y  . Years of Education: N/A   Occupational History  . retired Personal assistant    Social History Main Topics  . Smoking status: Current Every Day Smoker -- 1.00 packs/day for 55 years    Types: Cigarettes  . Smokeless tobacco: Never Used  . Alcohol Use: No     Comment: occcasionall 1/2 glass wine a month  . Drug Use: No  . Sexual Activity: Not on file   Other Topics Concern  . Not on file   Social History Narrative   Pt was adopted.    Lives at home by herself.   Not on home oxygen.    Family History  Problem Relation Age of Onset  . Adopted: Yes  . Family history unknown: Yes      Review of systems complete and found to be negative unless listed above      PHYSICAL EXAM  General: Well developed, well nourished, in no acute distress HEENT:  Normocephalic and atramatic Neck:  No JVD.  Lungs: Clear bilaterally to auscultation and percussion. Heart: HRRR . Normal S1 and S2 without gallops or murmurs.  Abdomen:  Bowel sounds are positive, abdomen soft and non-tender  Msk:  Back normal, normal gait. Normal strength and tone for age. Extremities: No clubbing, cyanosis or edema.   Neuro: Alert and oriented X 3. Psych:  Good affect, responds appropriately  Labs:   Lab Results  Component Value Date   WBC 6.0 07/14/2015   HGB 12.8 07/14/2015   HCT 40.3 07/14/2015   MCV 86.9 07/14/2015   PLT 222 07/14/2015    Recent Labs Lab 07/14/15 0419  NA 142  K 3.6  CL 105  CO2 30  BUN 24*  CREATININE 1.31*  CALCIUM 8.4*  GLUCOSE 103*   Lab Results  Component  Value Date   CKMB 1.3 07/14/2015   TROPONINI <0.03 07/14/2015    Lab Results  Component Value Date   CHOL 288* 08/28/2014   CHOL 230* 12/24/2012   CHOL 313* 12/05/2011   Lab Results  Component Value Date   HDL 49 08/28/2014   HDL 42 12/24/2012   HDL 44 12/05/2011   Lab Results  Component Value Date   LDLCALC 189 08/28/2014   LDLCALC 132* 12/24/2012   LDLCALC 220* 12/05/2011   Lab Results  Component Value Date   TRIG 252* 08/28/2014   TRIG 279* 12/24/2012   TRIG 243* 12/05/2011   No results found for: CHOLHDL No results found for: LDLDIRECT    Radiology: Dg Chest 2 View  07/13/2015   CLINICAL DATA:  Left-sided chest pain  EXAM: CHEST  2 VIEW  COMPARISON:  June 24, 2015 chest radiograph and April 29, 2015 chest CT  FINDINGS: There is no edema or consolidation. Heart size and pulmonary vascularity are normal. Pacemaker leads are attached to the right atrium right ventricle. No pneumothorax. No adenopathy. There is atherosclerotic change in the aorta. No bone lesions.  IMPRESSION: No edema or consolidation.   Electronically Signed   By: Lowella Grip III M.D.   On: 07/13/2015 11:45   Nm Pulmonary Perf And Vent  07/13/2015   CLINICAL DATA:  Shortness of breath since bowel surgery 06/22/2015. History of COPD. No previous pulmonary emboli. General fatigue.  EXAM: NUCLEAR MEDICINE VENTILATION - PERFUSION LUNG SCAN  TECHNIQUE:  Ventilation images were obtained in multiple projections using inhaled aerosol Tc-51m DTPA. Perfusion images were obtained in multiple projections after intravenous injection of Tc-31m MAA.  RADIOPHARMACEUTICALS:  43.52 mCi Technetium-64m DTPA aerosol inhalation and 4.27 mCi Technetium-39m MAA IV  COMPARISON:  04/24/2015 as well as chest CT 04/29/2015 and chest x-ray today.  FINDINGS: Ventilation: Examination demonstrates diffuse heterogeneous uptake with airway clumping and several segmental and nonsegmental defects bilaterally.  Perfusion: There is heterogeneity with matched segmental nonsegmental defects. The heterogeneity is less pronounced compared to the ventilation images.  Chest x-ray today demonstrates emphysematous disease without focal abnormality.  IMPRESSION: Heterogeneity with matched segmental and nonsegmental defects compatible with known COPD. No significant chest radiograph abnormality. Findings compatible with low probability for pulmonary embolism.   Electronically Signed   By: Marin Olp M.D.   On: 07/13/2015 18:50   Dg Chest Port 1 View  06/24/2015   CLINICAL DATA:  Chest pain radiating posteriorly.  EXAM: PORTABLE CHEST - 1 VIEW  COMPARISON:  Chest CT 04/29/2015, radiographs 04/24/2015  FINDINGS: Dual lead left-sided pacemaker remains in place. Lower lung volumes from prior exam. The heart size is normal for technique. Mild bibasilar atelectasis. No confluent airspace disease to suggest pneumonia. The pulmonary nodules on prior CT are not well seen radiographically. No large pleural effusion.  IMPRESSION: Hypoventilatory chest with bibasilar atelectasis.   Electronically Signed   By: Jeb Levering M.D.   On: 06/24/2015 05:12    EKG: Paced rhythm  ASSESSMENT AND PLAN:   1. Chest pain, atypical, likely pleuritic, with negative troponin 2. Bronchitis with productive cough, in patient with underlying COPD and ongoing tobacco abuse  Recommendations  1. Defer full dose  anticoagulation 2. Defer further cardiac diagnostics at this time 3. May discharge home from a cardiovascular perspective  Signed: Gibril Mastro MD,PhD, [facility] 07/14/2015, 3:34 PM

## 2015-07-14 NOTE — Telephone Encounter (Signed)
[place] scheduled hospital f/u for 07/20/15. Pt is being discharged today 07/14/15 for chest pains. Thanks TNP

## 2015-07-14 NOTE — Care Management (Signed)
Late Entry:  Patient from home alone. Admitted with CP.  Patient states that she uses [facility] in [place].  Patient states that her son lives close by as a support person, and provides transportation when needed.  Patient has a walker and cane available, but states she does not use them.  Patient was on 2L O2 acutely while in the hospital.  Qualifying O2 sats documented in chart.  Home O2 and home nebulizer ordered.  Will from [place] care notified, and delivered to patient.  Patient discharged to home today.

## 2015-07-15 LAB — EXPECTORATED SPUTUM ASSESSMENT W GRAM STAIN, RFLX TO RESP C: Special Requests: NORMAL

## 2015-07-15 LAB — EXPECTORATED SPUTUM ASSESSMENT W REFEX TO RESP CULTURE

## 2015-07-15 NOTE — Progress Notes (Signed)
   07/14/15 1159 07/14/15 1200 07/14/15 1201  Oxygen Therapy  SpO2 (!) 85 % (on exertion) (!) 88 % (at rest) 92 % (at rest)  O2 Device Room Air Room Air Nasal Cannula  O2 Flow Rate (L/min) --  --  2 L/min

## 2015-07-15 NOTE — Telephone Encounter (Signed)
Spoke with patient. Patient states she had bronchitis and walls of the lungs were infected. She was given antibiotics and was put on O2. She has appointment with Dr. Humphrey Rolls and Dr. Lorinda Creed to follow up also. She has been resting-aa

## 2015-07-16 DIAGNOSIS — R0602 Shortness of breath: Secondary | ICD-10-CM | POA: Diagnosis not present

## 2015-07-16 DIAGNOSIS — F17210 Nicotine dependence, cigarettes, uncomplicated: Secondary | ICD-10-CM | POA: Diagnosis not present

## 2015-07-16 DIAGNOSIS — G4700 Insomnia, unspecified: Secondary | ICD-10-CM | POA: Diagnosis not present

## 2015-07-16 DIAGNOSIS — R05 Cough: Secondary | ICD-10-CM | POA: Diagnosis not present

## 2015-07-16 DIAGNOSIS — R079 Chest pain, unspecified: Secondary | ICD-10-CM | POA: Diagnosis not present

## 2015-07-17 ENCOUNTER — Other Ambulatory Visit: Payer: Self-pay | Admitting: Internal Medicine

## 2015-07-17 ENCOUNTER — Other Ambulatory Visit: Payer: Self-pay | Admitting: Family Medicine

## 2015-07-17 DIAGNOSIS — K219 Gastro-esophageal reflux disease without esophagitis: Secondary | ICD-10-CM

## 2015-07-17 DIAGNOSIS — IMO0001 Reserved for inherently not codable concepts without codable children: Secondary | ICD-10-CM

## 2015-07-17 LAB — CULTURE, RESPIRATORY W GRAM STAIN

## 2015-07-17 LAB — CULTURE, RESPIRATORY
CULTURE: NORMAL
SPECIAL REQUESTS: NORMAL

## 2015-07-17 NOTE — Telephone Encounter (Signed)
controlled 

## 2015-07-20 ENCOUNTER — Encounter: Payer: Self-pay | Admitting: Family Medicine

## 2015-07-20 ENCOUNTER — Ambulatory Visit (INDEPENDENT_AMBULATORY_CARE_PROVIDER_SITE_OTHER): Payer: Medicare Other | Admitting: Family Medicine

## 2015-07-20 VITALS — BP 138/72 | HR 86 | Temp 98.1°F | Resp 16 | Wt 166.0 lb

## 2015-07-20 DIAGNOSIS — J441 Chronic obstructive pulmonary disease with (acute) exacerbation: Secondary | ICD-10-CM | POA: Diagnosis not present

## 2015-07-20 DIAGNOSIS — Z720 Tobacco use: Secondary | ICD-10-CM | POA: Diagnosis not present

## 2015-07-20 DIAGNOSIS — R079 Chest pain, unspecified: Secondary | ICD-10-CM

## 2015-07-20 MED ORDER — HYDROCODONE-ACETAMINOPHEN 5-325 MG PO TABS: 1.0000 | ORAL_TABLET | Freq: Four times a day (QID) | ORAL | Status: DC | PRN

## 2015-07-20 NOTE — Progress Notes (Signed)
Patient ID: Crystal White, female   DOB: [birth year], 74 y.o.   MRN: [id number]    Subjective:  HPI Hospital stay 8/15-8/16. Dx: Chest pain, Musculoskeletal chest pain, chronic respiratory failure, acute bronchitis, other emphysema, chronic renal insuffiencey  Test- Cardic enzymes, Neg, Chest Xray sputum Cx normal, EKG  Meds started- Albuterol Neb, Zpak, Tussionex, Mucinex, Nicotine patches. Pt advised to f/u with PCP, Dr. Humphrey Rolls and Dr. Lorinda Creed.   Pt reports that she is still having the chest pain and has to take pain medication every morning and let it get in her system before she can get up and get moving because of the pain. Hospital told her it was from coughing so much. Pt is still coughing and is still smoking.   Prior to Admission medications   Medication Sig Start Date End Date Taking? Authorizing Provider  acetaminophen (TYLENOL) 500 MG tablet Take 1,000 mg by mouth every 4 (four) hours as needed for mild pain or headache.    Yes Historical Provider, MD  albuterol (PROVENTIL HFA) 108 (90 BASE) MCG/ACT inhaler Inhale 2 puffs into the lungs every 6 (six) hours as needed for wheezing or shortness of breath.    Yes Historical Provider, MD  albuterol (PROVENTIL) (2.5 MG/3ML) 0.083% nebulizer solution Take 3 mLs (2.5 mg total) by nebulization every 6 (six) hours as needed for wheezing or shortness of breath. 07/14/15  Yes Theodoro Grist, MD  ALPRAZolam Duanne Moron) 0.25 MG tablet TAKE 1 TABLET BY MOUTH TWICE DAILY 07/17/15  Yes Jerrol Banana., MD  azithromycin [facility]) 250 MG tablet Take daily starting 17th of August 2016 for 4 days 07/15/15  Yes Theodoro Grist, MD  budesonide-formoterol (SYMBICORT) 160-4.5 MCG/ACT inhaler Inhale 2 puffs into the lungs 2 (two) times daily.    Yes Historical Provider, MD  chlorpheniramine-HYDROcodone (TUSSIONEX PENNKINETIC ER) 10-8 MG/5ML SUER Take 5 mLs by mouth 2 (two) times daily. 07/14/15  Yes Theodoro Grist, MD  citalopram (CELEXA) 10 MG tablet Take 10 mg  by mouth at bedtime.    Yes Historical Provider, MD  fluticasone (FLONASE) 50 MCG/ACT nasal spray Place 2 sprays into both nostrils daily as needed for rhinitis.    Yes Historical Provider, MD  gabapentin (NEURONTIN) 100 MG capsule Take 200 mg by mouth 3 (three) times daily.    Yes Historical Provider, MD  guaiFENesin (MUCINEX) 600 MG 12 hr tablet Take 1 tablet (600 mg total) by mouth 2 (two) times daily. 07/14/15  Yes Theodoro Grist, MD  hydrochlorothiazide (HYDRODIURIL) 25 MG tablet Take 25 mg by mouth daily.   Yes Historical Provider, MD  HYDROcodone-acetaminophen (NORCO/VICODIN) 5-325 MG per tablet Take 1-2 tablets by mouth every 6 (six) hours as needed for moderate pain. 06/26/15  Yes Marlyce Huge, MD  HYDROcodone-acetaminophen (NORCO/VICODIN) 5-325 MG per tablet Take 1 tablet by mouth every 6 (six) hours as needed for moderate pain or severe pain. 07/14/15  Yes Theodoro Grist, MD  hyoscyamine (LEVBID) 0.375 MG 12 hr tablet Take 0.375 mg by mouth 2 (two) times daily as needed for cramping.   Yes Historical Provider, MD  losartan (COZAAR) 50 MG tablet Take 50 mg by mouth at bedtime.   Yes Historical Provider, MD  magnesium oxide (MAG-OX) 400 MG tablet Take 400 mg by mouth 2 (two) times daily.   Yes Historical Provider, MD  meclizine (ANTIVERT) 25 MG tablet Take 1 tablet by mouth 2 (two) times daily as needed for dizziness.  10/23/13  Yes Historical Provider, MD  mirtazapine (REMERON) 30 MG  tablet Take 30 mg by mouth at bedtime.   Yes Historical Provider, MD  nicotine (NICODERM CQ - DOSED IN MG/24 HOURS) 21 mg/24hr patch Place 1 patch (21 mg total) onto the skin daily. 07/14/15  Yes Theodoro Grist, MD  nicotine (NICOTROL) 10 MG inhaler Inhale 1 cartridge (1 continuous puffing total) into the lungs as needed for smoking cessation. 07/14/15  Yes Theodoro Grist, MD  pantoprazole (PROTONIX) 40 MG tablet Take 40 mg by mouth 2 (two) times daily.   Yes Historical Provider, MD  simethicone (MYLICON) 80 MG  chewable tablet Chew 160 mg by mouth 2 (two) times daily as needed for flatulence.   Yes Historical Provider, MD  Suvorexant (BELSOMRA) 10 MG TABS Take 10 mg by mouth at bedtime as needed (for sleep).   Yes Historical Provider, MD  traZODone (DESYREL) 150 MG tablet Take 150 mg by mouth at bedtime.   Yes Historical Provider, MD  zolpidem (AMBIEN) 5 MG tablet Take 5 mg by mouth at bedtime as needed for sleep.   Yes Historical Provider, MD    Patient Active Problem List   Diagnosis Date Noted  . Musculoskeletal chest pain 07/14/2015  . Chronic respiratory failure 07/14/2015  . Acute bronchitis 07/14/2015  . Other emphysema 07/14/2015  . Chronic renal insufficiency 07/14/2015  . Chest pain 07/13/2015  . Multiple pulmonary nodules 06/02/2015  . Colon polyp 05/29/2015  . Hemoptysis 04/29/2015  . Sprain of ankle 04/14/2015  . Anxiety 04/14/2015  . Barrett's esophagus 04/14/2015  . Acute exacerbation of chronic obstructive airways disease 04/14/2015  . Bursitis 04/14/2015  . Cellulitis 04/14/2015  . Claudication 04/14/2015  . Cough 04/14/2015  . Deficiency, 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  . Clinical depression 04/14/2015  . Dizziness 04/14/2015  . Dyslipidemia 04/14/2015  . Essential (primary) hypertension 04/14/2015  . Flank pain 04/14/2015  . H/O suicide attempt 04/14/2015  . Dysphonia 04/14/2015  . Hypercholesteremia 04/14/2015  . Cannot sleep 04/14/2015  . Malaise and fatigue 04/14/2015  . Mild cognitive impairment 04/14/2015  . Cramp in muscle 04/14/2015  . Muscle ache 04/14/2015  . Neuropathy 04/14/2015  . Adiposity 04/14/2015  . 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 Candida infection of mouth 04/14/2015  . Abnormal loss of weight 04/14/2015  . Artificial cardiac pacemaker 10/08/2014  . Apnea, sleep 10/08/2014  . COPD (chronic obstructive pulmonary disease) 02/13/2012  . Tobacco abuse 02/13/2012  . Sinus congestion 02/13/2012  . Current tobacco use 02/13/2012  .  Esophagitis, reflux 08/28/2006  . Abdominal pain, right lower quadrant 08/22/2005  . Acute appendicitis with peritoneal abscess 08/22/2005    Past Medical History  Diagnosis Date  . Hypertension   . COPD (chronic obstructive pulmonary disease)   . Hyperlipidemia   . OSA (obstructive sleep apnea)     on CPAP  . Presence of permanent cardiac pacemaker     2014- [facility]  . GERD (gastroesophageal reflux disease)   . Stomach ulcer   . Barrett's esophagus   . Peripheral neuropathy   . Mobitz type II atrioventricular block     Social History   Social History  . Marital Status: Widowed    Spouse Name: widowed  . Number of Children: Y  . Years of Education: N/A   Occupational History  . retired Personal assistant    Social History Main Topics  . Smoking status: Current Every Day Smoker -- 1.00 packs/day for 55 years    Types: Cigarettes  . Smokeless tobacco: Never Used  . Alcohol Use: No  Comment: occcasionall 1/2 glass wine a month  . Drug Use: No  . Sexual Activity: Not on file   Other Topics Concern  . Not on file   Social History Narrative   Pt was adopted.    Lives at home by herself.   Not on home oxygen.    Allergies  Allergen Reactions  . Contrast Media [Iodinated Diagnostic Agents] Other (See Comments)    Reaction:  Burning and redness   . Statins Other (See Comments)    Reaction:  Leg cramps     Review of Systems  Constitutional: Negative.   HENT: Negative.   Eyes: Negative.   Respiratory: Positive for cough and shortness of breath.   Cardiovascular: Positive for chest pain.  Gastrointestinal: Negative.   Genitourinary: Negative.   Musculoskeletal: Positive for myalgias.  Skin: Negative.   Neurological: Positive for dizziness (lightness and difficulty focusing. ).  Endo/Heme/Allergies: Negative.   Psychiatric/Behavioral: Negative.     Immunization History  Administered Date(s) Administered  . Influenza Whole 08/28/2012  . Influenza-Unspecified  08/28/2013  . Pneumococcal Conjugate-13 08/28/2014  . Pneumococcal Polysaccharide-23 03/07/2011  . Tdap 08/28/2014  . Zoster 11/04/2012   Objective:  BP 138/72 mmHg  Pulse 86  Temp(Src) 98.1 F (36.7 C) (Oral)  Resp 16  Wt 166 lb (75.297 kg)  LMP  (Approximate)  Physical Exam  Constitutional: She is oriented to person, place, and time and well-developed, well-nourished, and in no distress.  HENT:  Head: Normocephalic and atraumatic.  Right Ear: External ear normal.  Left Ear: External ear normal.  Nose: Nose normal.  Eyes: Conjunctivae and EOM are normal. Pupils are equal, round, and reactive to light.  Neck: Normal range of motion. Neck supple.  Pulmonary/Chest: Effort normal and breath sounds normal.  Abdominal: Soft. Bowel sounds are normal.  Neurological: She is alert and oriented to person, place, and time. She has normal reflexes. Gait normal. GCS score is 15.  Skin: Skin is warm and dry.  Psychiatric: Mood, memory, affect and judgment normal.    Lab Results  Component Value Date   WBC 6.0 07/14/2015   HGB 12.8 07/14/2015   HCT 40.3 07/14/2015   PLT 222 07/14/2015   GLUCOSE 103* 07/14/2015   CHOL 288* 08/28/2014   TRIG 252* 08/28/2014   HDL 49 08/28/2014   LDLCALC 189 08/28/2014   TSH 1.41 08/28/2014    CMP     Component Value Date/Time   NA 142 07/14/2015 0419   NA 142 08/28/2014   NA 141 12/24/2012 1108   K 3.6 07/14/2015 0419   K 3.9 12/24/2012 1108   CL 105 07/14/2015 0419   CL 101 12/24/2012 1108   CO2 30 07/14/2015 0419   CO2 29 12/24/2012 1108   GLUCOSE 103* 07/14/2015 0419   GLUCOSE 112* 12/24/2012 1108   BUN 24* 07/14/2015 0419   BUN 18 08/28/2014   BUN 19* 12/24/2012 1108   CREATININE 1.31* 07/14/2015 0419   CREATININE 1.1 08/28/2014   CREATININE 1.36* 12/24/2012 1108   CALCIUM 8.4* 07/14/2015 0419   CALCIUM 9.6 12/24/2012 1108   PROT 5.9* 06/23/2015 0341   PROT 7.9 12/24/2012 1108   ALBUMIN 3.1* 06/23/2015 0341   ALBUMIN 4.0  12/24/2012 1108   AST 12* 06/23/2015 0341   AST 17 12/24/2012 1108   ALT 9* 06/23/2015 0341   ALT 21 12/24/2012 1108   ALKPHOS 56 06/23/2015 0341   ALKPHOS 107 12/24/2012 1108   BILITOT 0.3 06/23/2015 0341   BILITOT 0.4 12/24/2012  [place] 07/14/2015 0419   GFRNONAA 39* 12/24/2012 1108   GFRNONAA 51* 12/05/2011 1210   GFRAA 46* 07/14/2015 0419   GFRAA 46* 12/24/2012 1108   GFRAA >60 12/05/2011 1210    Assessment and Plan :  1. Tobacco abuse Told to quit.  2. Chest pain, unspecified chest pain type Refill pain medication. Will follow up 2-4 weeks.  Noncardiac.Negative w/u at hospital. 3. Acute exacerbation of chronic obstructive airways disease   Patient was seen and examined by Dr. [name], and noted scribed by Webb Laws, [place] MD [facility] 07/20/2015 11:18 AM

## 2015-07-28 DIAGNOSIS — Z720 Tobacco use: Secondary | ICD-10-CM | POA: Diagnosis not present

## 2015-07-28 DIAGNOSIS — Z950 Presence of cardiac pacemaker: Secondary | ICD-10-CM | POA: Diagnosis not present

## 2015-07-28 DIAGNOSIS — I10 Essential (primary) hypertension: Secondary | ICD-10-CM | POA: Diagnosis not present

## 2015-07-28 DIAGNOSIS — J439 Emphysema, unspecified: Secondary | ICD-10-CM | POA: Diagnosis not present

## 2015-07-31 ENCOUNTER — Ambulatory Visit
Admission: RE | Admit: 2015-07-31 | Discharge: 2015-07-31 | Disposition: A | Discharge status: Home / Self Care | Payer: Medicare Other | Source: Ambulatory Visit | Attending: Internal Medicine | Admitting: Internal Medicine

## 2015-07-31 DIAGNOSIS — K449 Diaphragmatic hernia without obstruction or gangrene: Secondary | ICD-10-CM | POA: Diagnosis not present

## 2015-07-31 DIAGNOSIS — K224 Dyskinesia of esophagus: Secondary | ICD-10-CM | POA: Diagnosis not present

## 2015-07-31 DIAGNOSIS — K219 Gastro-esophageal reflux disease without esophagitis: Secondary | ICD-10-CM | POA: Insufficient documentation

## 2015-07-31 DIAGNOSIS — K225 Diverticulum of esophagus, acquired: Secondary | ICD-10-CM | POA: Insufficient documentation

## 2015-07-31 DIAGNOSIS — K2970 Gastritis, unspecified, without bleeding: Secondary | ICD-10-CM | POA: Insufficient documentation

## 2015-07-31 DIAGNOSIS — IMO0001 Reserved for inherently not codable concepts without codable children: Secondary | ICD-10-CM

## 2015-08-04 ENCOUNTER — Ambulatory Visit
Admission: RE | Admit: 2015-08-04 | Discharge: 2015-08-04 | Disposition: A | Discharge status: Home / Self Care | Payer: Medicare Other | Source: Ambulatory Visit | Attending: Pulmonary Disease | Admitting: Pulmonary Disease

## 2015-08-04 DIAGNOSIS — R911 Solitary pulmonary nodule: Secondary | ICD-10-CM | POA: Diagnosis not present

## 2015-08-04 DIAGNOSIS — Z85038 Personal history of other malignant neoplasm of large intestine: Secondary | ICD-10-CM | POA: Insufficient documentation

## 2015-08-04 DIAGNOSIS — D3501 Benign neoplasm of right adrenal gland: Secondary | ICD-10-CM | POA: Diagnosis not present

## 2015-08-04 DIAGNOSIS — R042 Hemoptysis: Secondary | ICD-10-CM | POA: Diagnosis present

## 2015-08-04 DIAGNOSIS — R918 Other nonspecific abnormal finding of lung field: Secondary | ICD-10-CM | POA: Insufficient documentation

## 2015-08-04 DIAGNOSIS — I2510 Atherosclerotic heart disease of native coronary artery without angina pectoris: Secondary | ICD-10-CM | POA: Diagnosis not present

## 2015-08-04 DIAGNOSIS — J439 Emphysema, unspecified: Secondary | ICD-10-CM | POA: Diagnosis not present

## 2015-08-05 DIAGNOSIS — R0602 Shortness of breath: Secondary | ICD-10-CM | POA: Diagnosis not present

## 2015-08-10 ENCOUNTER — Ambulatory Visit (INDEPENDENT_AMBULATORY_CARE_PROVIDER_SITE_OTHER): Payer: Medicare Other | Admitting: Family Medicine

## 2015-08-10 ENCOUNTER — Encounter: Payer: Self-pay | Admitting: Family Medicine

## 2015-08-10 VITALS — BP 118/72 | HR 84 | Temp 98.2°F | Resp 18 | Wt 160.0 lb

## 2015-08-10 DIAGNOSIS — E7800 Pure hypercholesterolemia, unspecified: Secondary | ICD-10-CM

## 2015-08-10 DIAGNOSIS — R079 Chest pain, unspecified: Secondary | ICD-10-CM | POA: Diagnosis not present

## 2015-08-10 DIAGNOSIS — Z23 Encounter for immunization: Secondary | ICD-10-CM | POA: Diagnosis not present

## 2015-08-10 DIAGNOSIS — Z720 Tobacco use: Secondary | ICD-10-CM | POA: Diagnosis not present

## 2015-08-10 DIAGNOSIS — J439 Emphysema, unspecified: Secondary | ICD-10-CM | POA: Diagnosis not present

## 2015-08-10 DIAGNOSIS — K2100 Gastro-esophageal reflux disease with esophagitis, without bleeding: Secondary | ICD-10-CM

## 2015-08-10 DIAGNOSIS — I10 Essential (primary) hypertension: Secondary | ICD-10-CM

## 2015-08-10 MED ORDER — LOSARTAN POTASSIUM 50 MG PO TABS: 50.0000 mg | ORAL_TABLET | Freq: Every day | ORAL | Status: DC

## 2015-08-10 MED ORDER — PANTOPRAZOLE SODIUM 40 MG PO TBEC: 40.0000 mg | DELAYED_RELEASE_TABLET | Freq: Two times a day (BID) | ORAL | Status: DC

## 2015-08-10 MED ORDER — HYDROCHLOROTHIAZIDE 25 MG PO TABS: 25.0000 mg | ORAL_TABLET | Freq: Every day | ORAL | Status: DC

## 2015-08-10 MED ORDER — CITALOPRAM HYDROBROMIDE 10 MG PO TABS: 10.0000 mg | ORAL_TABLET | Freq: Every day | ORAL | Status: DC

## 2015-08-10 NOTE — Progress Notes (Signed)
Patient ID: Crystal White, female   DOB: [birth year], 74 y.o.   MRN: [id number]    Subjective:  HPI  Chest pain follow up: Patient states this has resolved. She has not had to take pain medication in the past 4 days.  Hypertension follow up: Patient is not checking her B/P but feels like it is running fine.  GERD follow up: Patient states her symptoms are controlled.  Insomnia follow up: She has been taking Ambien almost every night.   Since last visit she has had CT tests, Upper GI and has sleep study scheduled all through Dr. Humphrey Rolls.  Prior to Admission medications   Medication Sig Start Date End Date Taking? Authorizing Provider  acetaminophen (TYLENOL) 500 MG tablet Take 1,000 mg by mouth every 4 (four) hours as needed for mild pain or headache.    Yes Historical Provider, MD  albuterol (PROVENTIL HFA) 108 (90 BASE) MCG/ACT inhaler Inhale 2 puffs into the lungs every 6 (six) hours as needed for wheezing or shortness of breath.    Yes Historical Provider, MD  albuterol (PROVENTIL) (2.5 MG/3ML) 0.083% nebulizer solution Take 3 mLs (2.5 mg total) by nebulization every 6 (six) hours as needed for wheezing or shortness of breath. 07/14/15  Yes Theodoro Grist, MD  ALPRAZolam Duanne Moron) 0.25 MG tablet TAKE 1 TABLET BY MOUTH TWICE DAILY 07/17/15  Yes Jerrol Banana., MD  budesonide-formoterol [facility]) 160-4.5 MCG/ACT inhaler Inhale 2 puffs into the lungs 2 (two) times daily.    Yes Historical Provider, MD  citalopram (CELEXA) 10 MG tablet Take 10 mg by mouth at bedtime.    Yes Historical Provider, MD  fluticasone (FLONASE) 50 MCG/ACT nasal spray Place 2 sprays into both nostrils daily as needed for rhinitis.    Yes Historical Provider, MD  gabapentin (NEURONTIN) 100 MG capsule Take 200 mg by mouth 3 (three) times daily.    Yes Historical Provider, MD  guaiFENesin (MUCINEX) 600 MG 12 hr tablet Take 1 tablet (600 mg total) by mouth 2 (two) times daily. 07/14/15  Yes Theodoro Grist, MD    hydrochlorothiazide (HYDRODIURIL) 25 MG tablet Take 25 mg by mouth daily.   Yes Historical Provider, MD  HYDROcodone-acetaminophen (NORCO/VICODIN) 5-325 MG per tablet Take 1 tablet by mouth every 6 (six) hours as needed for moderate pain or severe pain. 07/20/15  Yes Richard Maceo Pro., MD  hyoscyamine (LEVBID) 0.375 MG 12 hr tablet Take 0.375 mg by mouth 2 (two) times daily as needed for cramping.   Yes Historical Provider, MD  losartan (COZAAR) 50 MG tablet Take 50 mg by mouth at bedtime.   Yes Historical Provider, MD  magnesium oxide (MAG-OX) 400 MG tablet Take 400 mg by mouth 2 (two) times daily.   Yes Historical Provider, MD  meclizine (ANTIVERT) 25 MG tablet Take 1 tablet by mouth 2 (two) times daily as needed for dizziness.  10/23/13  Yes Historical Provider, MD  mirtazapine (REMERON) 30 MG tablet Take 30 mg by mouth at bedtime.   Yes Historical Provider, MD  nicotine (NICODERM CQ - DOSED IN MG/24 HOURS) 21 mg/24hr patch Place 1 patch (21 mg total) onto the skin daily. 07/14/15  Yes Theodoro Grist, MD  nicotine (NICOTROL) 10 MG inhaler Inhale 1 cartridge (1 continuous puffing total) into the lungs as needed for smoking cessation. 07/14/15  Yes Theodoro Grist, MD  pantoprazole (PROTONIX) 40 MG tablet Take 40 mg by mouth 2 (two) times daily.   Yes Historical Provider, MD  simethicone (MYLICON) 80  MG chewable tablet Chew 160 mg by mouth 2 (two) times daily as needed for flatulence.   Yes Historical Provider, MD  Suvorexant (BELSOMRA) 10 MG TABS Take 10 mg by mouth at bedtime as needed (for sleep).   Yes Historical Provider, MD  traZODone (DESYREL) 150 MG tablet Take 150 mg by mouth at bedtime.   Yes Historical Provider, MD  zolpidem (AMBIEN) 5 MG tablet Take 5 mg by mouth at bedtime as needed for sleep.   Yes Historical Provider, MD    Patient Active Problem List   Diagnosis Date Noted  . Musculoskeletal chest pain 07/14/2015  . Chronic respiratory failure 07/14/2015  . Acute bronchitis  07/14/2015  . Other emphysema 07/14/2015  . Chronic renal insufficiency 07/14/2015  . Chest pain 07/13/2015  . Multiple pulmonary nodules 06/02/2015  . Colon polyp 05/29/2015  . Hemoptysis 04/29/2015  . Sprain of ankle 04/14/2015  . Anxiety 04/14/2015  . Barrett's esophagus 04/14/2015  . Acute exacerbation of chronic obstructive airways disease 04/14/2015  . Bursitis 04/14/2015  . Cellulitis 04/14/2015  . Claudication 04/14/2015  . Cough 04/14/2015  . Deficiency, 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  . Clinical depression 04/14/2015  . Dizziness 04/14/2015  . Dyslipidemia 04/14/2015  . Essential (primary) hypertension 04/14/2015  . Flank pain 04/14/2015  . H/O suicide attempt 04/14/2015  . Dysphonia 04/14/2015  . Hypercholesteremia 04/14/2015  . Cannot sleep 04/14/2015  . Malaise and fatigue 04/14/2015  . Mild cognitive impairment 04/14/2015  . Cramp in muscle 04/14/2015  . Muscle ache 04/14/2015  . Neuropathy 04/14/2015  . Adiposity 04/14/2015  . 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 Candida infection of mouth 04/14/2015  . Abnormal loss of weight 04/14/2015  . Artificial cardiac pacemaker 10/08/2014  . Apnea, sleep 10/08/2014  . COPD (chronic obstructive pulmonary disease) 02/13/2012  . Tobacco abuse 02/13/2012  . Sinus congestion 02/13/2012  . Current tobacco use 02/13/2012  . Esophagitis, reflux 08/28/2006  . Abdominal pain, right lower quadrant 08/22/2005  . Acute appendicitis with peritoneal abscess 08/22/2005    Past Medical History  Diagnosis Date  . Hypertension   . COPD (chronic obstructive pulmonary disease)   . Hyperlipidemia   . OSA (obstructive sleep apnea)     on CPAP  . Presence of permanent cardiac pacemaker     2014- [facility]  . GERD (gastroesophageal reflux disease)   . Stomach ulcer   . Barrett's esophagus   . Peripheral neuropathy   . Mobitz type II atrioventricular block     Social History   Social History  . Marital Status: Widowed     Spouse Name: widowed  . Number of Children: Y  . Years of Education: N/A   Occupational History  . retired Personal assistant    Social History Main Topics  . Smoking status: Current Every Day Smoker -- 1.00 packs/day for 55 years    Types: Cigarettes  . Smokeless tobacco: Never Used  . Alcohol Use: No     Comment: occcasionall 1/2 glass wine a month  . Drug Use: No  . Sexual Activity: Not on file   Other Topics Concern  . Not on file   Social History Narrative   Pt was adopted.    Lives at home by herself.   Not on home oxygen.    Allergies  Allergen Reactions  . Contrast Media [Iodinated Diagnostic Agents] Other (See Comments)    Reaction:  Burning and redness   . Statins Other (See Comments)    Reaction:  Leg cramps  Review of Systems  Constitutional: Negative.   Eyes: Negative.   Respiratory: Positive for cough and shortness of breath.   Cardiovascular: Negative.   Gastrointestinal: Negative.   Genitourinary: Negative.   Musculoskeletal: Positive for joint pain.  Neurological: Negative.   Endo/Heme/Allergies: Negative.   Psychiatric/Behavioral: Negative for suicidal ideas. The patient has insomnia.     Immunization History  Administered Date(s) Administered  . Influenza Whole 08/28/2012  . Influenza-Unspecified 08/28/2013  . Pneumococcal Conjugate-13 08/28/2014  . Pneumococcal Polysaccharide-23 03/07/2011  . Tdap 08/28/2014  . Zoster 11/04/2012   Objective:  BP 118/72 mmHg  Pulse 84  Temp(Src) 98.2 F (36.8 C)  Resp 18  Wt 160 lb (72.576 kg)  LMP  (Approximate)  Physical Exam  Constitutional: She is oriented to person, place, and time and well-developed, well-nourished, and in no distress.  HENT:  Head: Normocephalic and atraumatic.  Right Ear: External ear normal.  Left Ear: External ear normal.  Nose: Nose normal.  Eyes: Conjunctivae are normal. Pupils are equal, round, and reactive to light.  Neck: Neck supple.  Cardiovascular: Normal rate,  regular rhythm and normal heart sounds.   Pulmonary/Chest: Effort normal and breath sounds normal.  Abdominal: Soft.  Neurological: She is alert and oriented to person, place, and time.  Skin: Skin is warm and dry.  Psychiatric: Mood, memory, affect and judgment normal.    Lab Results  Component Value Date   WBC 6.0 07/14/2015   HGB 12.8 07/14/2015   HCT 40.3 07/14/2015   PLT 222 07/14/2015   GLUCOSE 103* 07/14/2015   CHOL 288* 08/28/2014   TRIG 252* 08/28/2014   HDL 49 08/28/2014   LDLCALC 189 08/28/2014   TSH 1.41 08/28/2014    CMP     Component Value Date/Time   NA 142 07/14/2015 0419   NA 142 08/28/2014   NA 141 12/24/2012 1108   K 3.6 07/14/2015 0419   K 3.9 12/24/2012 1108   CL 105 07/14/2015 0419   CL 101 12/24/2012 1108   CO2 30 07/14/2015 0419   CO2 29 12/24/2012 1108   GLUCOSE 103* 07/14/2015 0419   GLUCOSE 112* 12/24/2012 1108   BUN 24* 07/14/2015 0419   BUN 18 08/28/2014   BUN 19* 12/24/2012 1108   CREATININE 1.31* 07/14/2015 0419   CREATININE 1.1 08/28/2014   CREATININE 1.36* 12/24/2012 1108   CALCIUM 8.4* 07/14/2015 0419   CALCIUM 9.6 12/24/2012 1108   PROT 5.9* 06/23/2015 0341   PROT 7.9 12/24/2012 1108   ALBUMIN 3.1* 06/23/2015 0341   ALBUMIN 4.0 12/24/2012 1108   AST 12* 06/23/2015 0341   AST 17 12/24/2012 1108   ALT 9* 06/23/2015 0341   ALT 21 12/24/2012 1108   ALKPHOS 56 06/23/2015 0341   ALKPHOS 107 12/24/2012 1108   BILITOT 0.3 06/23/2015 0341   BILITOT 0.4 12/24/2012 1108   GFRNONAA 40* 07/14/2015 0419   GFRNONAA 39* 12/24/2012 1108   GFRNONAA 51* 12/05/2011 1210   GFRAA 46* 07/14/2015 0419   GFRAA 46* 12/24/2012 1108   GFRAA >60 12/05/2011 1210    Assessment and Plan :  1. Chest pain, unspecified chest pain type   2. Essential (primary) hypertension   3. Pulmonary emphysema, unspecified emphysema type   4. Esophagitis, reflux   5. Tobacco abuse Pt advised to quit.  6. Hypercholesteremia   7. Need for influenza  vaccination  - Flu vaccine HIGH DOSE PF (Fluzone High dose)   [name] MD [facility] 08/10/2015 11:24  AM

## 2015-08-11 ENCOUNTER — Encounter: Payer: Self-pay | Admitting: Internal Medicine

## 2015-08-11 DIAGNOSIS — G4710 Hypersomnia, unspecified: Secondary | ICD-10-CM | POA: Diagnosis not present

## 2015-08-14 NOTE — Progress Notes (Signed)
Quick Note:  Attempted to call pt. Line was busy ______

## 2015-08-20 DIAGNOSIS — R911 Solitary pulmonary nodule: Secondary | ICD-10-CM | POA: Diagnosis not present

## 2015-08-20 DIAGNOSIS — G4733 Obstructive sleep apnea (adult) (pediatric): Secondary | ICD-10-CM | POA: Diagnosis not present

## 2015-08-20 DIAGNOSIS — F17210 Nicotine dependence, cigarettes, uncomplicated: Secondary | ICD-10-CM | POA: Diagnosis not present

## 2015-08-20 DIAGNOSIS — R0602 Shortness of breath: Secondary | ICD-10-CM | POA: Diagnosis not present

## 2015-08-26 ENCOUNTER — Telehealth: Payer: Self-pay | Admitting: Pulmonary Disease

## 2015-08-26 DIAGNOSIS — R911 Solitary pulmonary nodule: Secondary | ICD-10-CM

## 2015-08-26 NOTE — Telephone Encounter (Signed)
Result Notes     Notes Recorded by Len Blalock, CMA on 08/18/2015 at 5:25 PM Letter sent ------  Notes Recorded by Osa Craver, CMA on 08/14/2015 at 9:58 AM Attempted to call pt. Line was busy ------  Notes Recorded by Christie Beckers, RN on 08/13/2015 at 1:28 PM ATC - Fast busy signal - WCB ------  Notes Recorded by Glean Hess, CMA on 08/11/2015 at 11:42 AM Attempted to contact patient, line busy. wcb ------  Notes Recorded by Len Blalock, CMA on 08/06/2015 at 3:31 PM atc pt X2, line busy. Wcb. ------  Notes Recorded by Juanito Doom, MD on 08/05/2015 at 2:11 PM A, Please let her know that the nodules were stable. She will need another CT in 6 months. Will discuss more on the next visit. Thanks B     Attempted to call pt. Received fast busy signal. Will try back.

## 2015-08-27 NOTE — Telephone Encounter (Signed)
[phone] calling back

## 2015-08-27 NOTE — Telephone Encounter (Signed)
Called again and fast busy tone  Called her son, Konrad Dolores (EC) and LMTCB

## 2015-08-27 NOTE — Telephone Encounter (Signed)
Tried to call phone number given Fast busy tone and call would not go through  Will call back later

## 2015-08-28 NOTE — Telephone Encounter (Signed)
Called # for pt and received fasy busy signal. Called # for alternate contact Tommy (EC) and LMTCB x1

## 2015-08-31 NOTE — Telephone Encounter (Signed)
ATC pt - busy signal.  LMTCB for Crystal White.

## 2015-09-01 NOTE — Telephone Encounter (Signed)
Spoke with pt's son, Konrad Dolores. He is aware of results. Order will be placed for 6 month CT. Nothing further was needed.

## 2015-09-15 DIAGNOSIS — G4733 Obstructive sleep apnea (adult) (pediatric): Secondary | ICD-10-CM | POA: Diagnosis not present

## 2015-09-17 ENCOUNTER — Other Ambulatory Visit: Payer: Self-pay | Admitting: Family Medicine

## 2015-09-28 DIAGNOSIS — F17210 Nicotine dependence, cigarettes, uncomplicated: Secondary | ICD-10-CM | POA: Diagnosis not present

## 2015-09-28 DIAGNOSIS — R911 Solitary pulmonary nodule: Secondary | ICD-10-CM | POA: Diagnosis not present

## 2015-09-28 DIAGNOSIS — J449 Chronic obstructive pulmonary disease, unspecified: Secondary | ICD-10-CM | POA: Diagnosis not present

## 2015-09-28 DIAGNOSIS — G4733 Obstructive sleep apnea (adult) (pediatric): Secondary | ICD-10-CM | POA: Diagnosis not present

## 2015-10-08 DIAGNOSIS — Z950 Presence of cardiac pacemaker: Secondary | ICD-10-CM | POA: Diagnosis not present

## 2015-10-08 DIAGNOSIS — R0602 Shortness of breath: Secondary | ICD-10-CM | POA: Diagnosis not present

## 2015-10-08 DIAGNOSIS — E7800 Pure hypercholesterolemia, unspecified: Secondary | ICD-10-CM | POA: Diagnosis not present

## 2015-10-08 DIAGNOSIS — I739 Peripheral vascular disease, unspecified: Secondary | ICD-10-CM | POA: Diagnosis not present

## 2015-10-08 DIAGNOSIS — I10 Essential (primary) hypertension: Secondary | ICD-10-CM | POA: Diagnosis not present

## 2015-10-08 DIAGNOSIS — I442 Atrioventricular block, complete: Secondary | ICD-10-CM | POA: Diagnosis not present

## 2015-10-08 DIAGNOSIS — J439 Emphysema, unspecified: Secondary | ICD-10-CM | POA: Diagnosis not present

## 2015-10-15 ENCOUNTER — Ambulatory Visit: Payer: Medicare Other | Admitting: Family Medicine

## 2015-10-19 ENCOUNTER — Encounter: Payer: Self-pay | Admitting: Family Medicine

## 2015-10-19 ENCOUNTER — Ambulatory Visit (INDEPENDENT_AMBULATORY_CARE_PROVIDER_SITE_OTHER): Payer: Medicare Other | Admitting: Family Medicine

## 2015-10-19 VITALS — BP 134/62 | HR 84 | Temp 98.0°F | Resp 16 | Wt 166.0 lb

## 2015-10-19 DIAGNOSIS — I10 Essential (primary) hypertension: Secondary | ICD-10-CM | POA: Diagnosis not present

## 2015-10-19 DIAGNOSIS — J449 Chronic obstructive pulmonary disease, unspecified: Secondary | ICD-10-CM | POA: Diagnosis not present

## 2015-10-19 DIAGNOSIS — Z720 Tobacco use: Secondary | ICD-10-CM

## 2015-10-19 DIAGNOSIS — F419 Anxiety disorder, unspecified: Secondary | ICD-10-CM | POA: Diagnosis not present

## 2015-10-19 NOTE — Progress Notes (Signed)
Patient ID: Crystal White, female   DOB: 1941/09/17, 74 y.o.   MRN: [id number]    Subjective:  HPI  Hypertension follow up: Patient was last seen in September. Not checking B/P at home.  Chest pain has resolved.  She has seen Dr. Lorinda Creed and had echo done but does not know the results yet. She has seen Dr. Humphrey Rolls and had sleep study done and she has sleep apnea and uses CPAP at night time and has O2 attached to that, she does use O2 during the day as needed also. Prior to Admission medications   Medication Sig Start Date End Date Taking? Authorizing Provider  acetaminophen (TYLENOL) 500 MG tablet Take 1,000 mg by mouth every 4 (four) hours as needed for mild pain or headache.    Yes Historical Provider, MD  albuterol (PROVENTIL HFA) 108 (90 BASE) MCG/ACT inhaler Inhale 2 puffs into the lungs every 6 (six) hours as needed for wheezing or shortness of breath.    Yes Historical Provider, MD  albuterol (PROVENTIL) (2.5 MG/3ML) 0.083% nebulizer solution Take 3 mLs (2.5 mg total) by nebulization every 6 (six) hours as needed for wheezing or shortness of breath. 07/14/15  Yes Theodoro Grist, MD  ALPRAZolam Duanne Moron) 0.25 MG tablet TAKE 1 TABLET BY MOUTH TWICE DAILY 07/17/15  Yes Jerrol Banana., MD  budesonide-formoterol [facility]) 160-4.5 MCG/ACT inhaler Inhale 2 puffs into the lungs 2 (two) times daily.    Yes Historical Provider, MD  citalopram (CELEXA) 10 MG tablet Take 1 tablet (10 mg total) by mouth at bedtime. 08/10/15  Yes Carlous Olivares Maceo Pro., MD  fluticasone [facility]) 50 MCG/ACT nasal spray Place 2 sprays into both nostrils daily as needed for rhinitis.    Yes Historical Provider, MD  gabapentin (NEURONTIN) 100 MG capsule Take 200 mg by mouth 3 (three) times daily.    Yes Historical Provider, MD  guaiFENesin (MUCINEX) 600 MG 12 hr tablet Take 1 tablet (600 mg total) by mouth 2 (two) times daily. 07/14/15  Yes Theodoro Grist, MD  hydrochlorothiazide (HYDRODIURIL) 25 MG tablet Take 1 tablet  (25 mg total) by mouth daily. 08/10/15  Yes Destani Wamser Maceo Pro., MD  HYDROcodone-acetaminophen (NORCO/VICODIN) 5-325 MG per tablet Take 1 tablet by mouth every 6 (six) hours as needed for moderate pain or severe pain. 07/20/15  Yes Breck Hollinger Maceo Pro., MD  hyoscyamine (LEVBID) 0.375 MG 12 hr tablet Take 0.375 mg by mouth 2 (two) times daily as needed for cramping.   Yes Historical Provider, MD  losartan (COZAAR) 50 MG tablet Take 1 tablet (50 mg total) by mouth at bedtime. 08/10/15  Yes Jhoselyn Ruffini Maceo Pro., MD  magnesium oxide (MAG-OX) 400 MG tablet Take 400 mg by mouth 2 (two) times daily.   Yes Historical Provider, MD  meclizine (ANTIVERT) 25 MG tablet Take 1 tablet by mouth 2 (two) times daily as needed for dizziness.  10/23/13  Yes Historical Provider, MD  mirtazapine (REMERON) 30 MG tablet Take 30 mg by mouth at bedtime.   Yes Historical Provider, MD  nicotine (NICODERM CQ - DOSED IN MG/24 HOURS) 21 mg/24hr patch Place 1 patch (21 mg total) onto the skin daily. 07/14/15  Yes Theodoro Grist, MD  nicotine (NICOTROL) 10 MG inhaler Inhale 1 cartridge (1 continuous puffing total) into the lungs as needed for smoking cessation. 07/14/15  Yes Theodoro Grist, MD  pantoprazole (PROTONIX) 40 MG tablet Take 1 tablet (40 mg total) by mouth 2 (two) times daily. 08/10/15  Yes Kenlie Seki  L Cranford Mon., MD  simethicone (MYLICON) 80 MG chewable tablet Chew 160 mg by mouth 2 (two) times daily as needed for flatulence.   Yes Historical Provider, MD  Suvorexant (BELSOMRA) 10 MG TABS Take 10 mg by mouth at bedtime as needed (for sleep).   Yes Historical Provider, MD  Tiotropium Bromide Monohydrate (SPIRIVA RESPIMAT) 1.25 MCG/ACT AERS  08/21/15  Yes Historical Provider, MD  traZODone (DESYREL) 150 MG tablet Take 150 mg by mouth at bedtime.   Yes Historical Provider, MD  zolpidem (AMBIEN) 5 MG tablet TAKE 1 TABLET BY MOUTH AT BEDTIME AS NEEDED 09/17/15  Yes Jerrol Banana., MD    Patient Active Problem List    Diagnosis Date Noted  . Musculoskeletal chest pain 07/14/2015  . Chronic respiratory failure ([place]) 07/14/2015  . Acute bronchitis 07/14/2015  . Other emphysema ([place]) 07/14/2015  . Chronic renal insufficiency 07/14/2015  . Chest pain 07/13/2015  . Multiple pulmonary nodules 06/02/2015  . Colon polyp 05/29/2015  . Hemoptysis 04/29/2015  . Sprain of ankle 04/14/2015  . Anxiety 04/14/2015  . Barrett's esophagus 04/14/2015  . Acute exacerbation of chronic obstructive airways disease ([place]) 04/14/2015  . Bursitis 04/14/2015  . Cellulitis 04/14/2015  . Claudication ([place]) 04/14/2015  . Cough 04/14/2015  . Deficiency, 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  . Clinical depression 04/14/2015  . Dizziness 04/14/2015  . Dyslipidemia 04/14/2015  . Essential (primary) hypertension 04/14/2015  . Flank pain 04/14/2015  . H/O suicide attempt 04/14/2015  . Dysphonia 04/14/2015  . Hypercholesteremia 04/14/2015  . Cannot sleep 04/14/2015  . Malaise and fatigue 04/14/2015  . Mild cognitive impairment 04/14/2015  . Cramp in muscle 04/14/2015  . Muscle ache 04/14/2015  . Neuropathy ([place]) 04/14/2015  . Adiposity 04/14/2015  . 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 Candida infection of mouth 04/14/2015  . Abnormal loss of weight 04/14/2015  . Artificial cardiac pacemaker 10/08/2014  . Apnea, sleep 10/08/2014  . COPD (chronic obstructive pulmonary disease) ([place]) 02/13/2012  . Tobacco abuse 02/13/2012  . Sinus congestion 02/13/2012  . Current tobacco use 02/13/2012  . Esophagitis, reflux 08/28/2006  . Abdominal pain, right lower quadrant 08/22/2005  . Acute appendicitis with peritoneal abscess 08/22/2005    Past Medical History  Diagnosis Date  . Hypertension   . COPD (chronic obstructive pulmonary disease) ([place])   . Hyperlipidemia   . OSA (obstructive sleep apnea)     on CPAP  . Presence of permanent cardiac pacemaker     2014- [facility]  . GERD (gastroesophageal reflux disease)   . Stomach ulcer   .  Barrett's esophagus   . Peripheral neuropathy ([place])   . Mobitz type II atrioventricular block     Social History   Social History  . Marital Status: Widowed    Spouse Name: widowed  . Number of Children: Y  . Years of Education: N/A   Occupational History  . retired Personal assistant    Social History Main Topics  . Smoking status: Current Every Day Smoker -- 1.00 packs/day for 55 years    Types: Cigarettes  . Smokeless tobacco: Never Used  . Alcohol Use: No     Comment: occcasionall 1/2 glass wine a month  . Drug Use: No  . Sexual Activity: No   Other Topics Concern  . Not on file   Social History Narrative   Pt was adopted.    Lives at home by herself.   Not on home oxygen.    Allergies  Allergen Reactions  . Contrast  Media [Iodinated Diagnostic Agents] Other (See Comments)    Reaction:  Burning and redness   . Statins Other (See Comments)    Reaction:  Leg cramps     Review of Systems  Constitutional: Negative.   Respiratory: Negative.   Cardiovascular: Negative.   Musculoskeletal: Negative.   Neurological: Negative.     Immunization History  Administered Date(s) Administered  . Influenza Whole 08/28/2012  . Influenza, High Dose Seasonal PF 08/10/2015  . Influenza-Unspecified 08/28/2013  . Pneumococcal Conjugate-13 08/28/2014  . Pneumococcal Polysaccharide-23 03/07/2011  . Tdap 08/28/2014  . Zoster 11/04/2012   Objective:  BP 134/62 mmHg  Pulse 84  Temp(Src) 98 F (36.7 C)  Resp 16  Wt 166 lb (75.297 kg)  LMP  (Approximate)  Physical Exam  Constitutional: She is oriented to person, place, and time and well-developed, well-nourished, and in no distress.  HENT:  Head: Normocephalic and atraumatic.  Right Ear: External ear normal.  Left Ear: External ear normal.  Nose: Nose normal.  Eyes: Conjunctivae are normal.  Neck: Neck supple.  Cardiovascular: Normal rate, regular rhythm and normal heart sounds.   Pulmonary/Chest: Effort normal and breath  sounds normal.  Abdominal: Soft.  Neurological: She is alert and oriented to person, place, and time.  Skin: Skin is warm and dry.  Psychiatric: Mood, memory, affect and judgment normal.    Lab Results  Component Value Date   WBC 6.0 07/14/2015   HGB 12.8 07/14/2015   HCT 40.3 07/14/2015   PLT 222 07/14/2015   GLUCOSE 103* 07/14/2015   CHOL 288* 08/28/2014   TRIG 252* 08/28/2014   HDL 49 08/28/2014   LDLCALC 189 08/28/2014   TSH 1.41 08/28/2014    CMP     Component Value Date/Time   NA 142 07/14/2015 0419   NA 142 08/28/2014   NA 141 12/24/2012 1108   K 3.6 07/14/2015 0419   K 3.9 12/24/2012 1108   CL 105 07/14/2015 0419   CL 101 12/24/2012 1108   CO2 30 07/14/2015 0419   CO2 29 12/24/2012 1108   GLUCOSE 103* 07/14/2015 0419   GLUCOSE 112* 12/24/2012 1108   BUN 24* 07/14/2015 0419   BUN 18 08/28/2014   BUN 19* 12/24/2012 1108   CREATININE 1.31* 07/14/2015 0419   CREATININE 1.1 08/28/2014   CREATININE 1.36* 12/24/2012 1108   CALCIUM 8.4* 07/14/2015 0419   CALCIUM 9.6 12/24/2012 1108   PROT 5.9* 06/23/2015 0341   PROT 7.9 12/24/2012 1108   ALBUMIN 3.1* 06/23/2015 0341   ALBUMIN 4.0 12/24/2012 1108   AST 12* 06/23/2015 0341   AST 17 12/24/2012 1108   ALT 9* 06/23/2015 0341   ALT 21 12/24/2012 1108   ALKPHOS 56 06/23/2015 0341   ALKPHOS 107 12/24/2012 1108   BILITOT 0.3 06/23/2015 0341   BILITOT 0.4 12/24/2012 1108   GFRNONAA 40* 07/14/2015 0419   GFRNONAA 39* 12/24/2012 1108   GFRNONAA 51* 12/05/2011 1210   GFRAA 46* 07/14/2015 0419   GFRAA 46* 12/24/2012 1108   GFRAA >60 12/05/2011 1210    Assessment and Plan :  No diagnosis found. HTN COPD GERD MDD/GAD OSA Tobacco Abuse [name] MD [facility] 10/19/2015 2:55 PM

## 2015-12-29 ENCOUNTER — Other Ambulatory Visit: Payer: Self-pay | Admitting: Family Medicine

## 2016-01-22 ENCOUNTER — Ambulatory Visit: Payer: Medicare Other

## 2016-01-22 ENCOUNTER — Ambulatory Visit
Admission: EM | Admit: 2016-01-22 | Discharge: 2016-01-22 | Disposition: A | Discharge status: Home / Self Care | Payer: Medicare Other | Attending: Family Medicine | Admitting: Family Medicine

## 2016-01-22 DIAGNOSIS — Z950 Presence of cardiac pacemaker: Secondary | ICD-10-CM | POA: Diagnosis not present

## 2016-01-22 DIAGNOSIS — K219 Gastro-esophageal reflux disease without esophagitis: Secondary | ICD-10-CM | POA: Insufficient documentation

## 2016-01-22 DIAGNOSIS — M940 Chondrocostal junction syndrome [Tietze]: Secondary | ICD-10-CM | POA: Diagnosis not present

## 2016-01-22 DIAGNOSIS — G4733 Obstructive sleep apnea (adult) (pediatric): Secondary | ICD-10-CM | POA: Insufficient documentation

## 2016-01-22 DIAGNOSIS — R05 Cough: Secondary | ICD-10-CM | POA: Diagnosis not present

## 2016-01-22 DIAGNOSIS — F17210 Nicotine dependence, cigarettes, uncomplicated: Secondary | ICD-10-CM | POA: Insufficient documentation

## 2016-01-22 DIAGNOSIS — G629 Polyneuropathy, unspecified: Secondary | ICD-10-CM | POA: Insufficient documentation

## 2016-01-22 DIAGNOSIS — I10 Essential (primary) hypertension: Secondary | ICD-10-CM | POA: Insufficient documentation

## 2016-01-22 DIAGNOSIS — R0781 Pleurodynia: Secondary | ICD-10-CM | POA: Diagnosis present

## 2016-01-22 DIAGNOSIS — J449 Chronic obstructive pulmonary disease, unspecified: Secondary | ICD-10-CM | POA: Insufficient documentation

## 2016-01-22 DIAGNOSIS — E785 Hyperlipidemia, unspecified: Secondary | ICD-10-CM | POA: Insufficient documentation

## 2016-01-22 MED ORDER — HYDROCODONE-ACETAMINOPHEN 5-325 MG PO TABS
1.0000 | ORAL_TABLET | Freq: Four times a day (QID) | ORAL | Status: DC | PRN
Start: 2016-01-22 — End: 2016-02-25

## 2016-01-22 NOTE — ED Provider Notes (Signed)
CSN: [id number]     Arrival date & time 01/22/16  1311 History   First MD Initiated Contact with Patient 01/22/16 1555     Chief Complaint  Patient presents with  . Chest Pain    Rib Pain on her Left Side   (Consider location/radiation/quality/duration/timing/severity/associated sxs/prior Treatment) HPI Comments: 75 yo female with a 3 days h/o left lower ribs pain, after exerting a twisting motion and 'hearing a pop". States worse with movement and deep breaths. Denies any fevers, chills, shortness of breath. Pain unrelieved with otc analgesics.   The history is provided by the patient.    Past Medical History  Diagnosis Date  . Hypertension   . COPD (chronic obstructive pulmonary disease) ([place])   . Hyperlipidemia   . OSA (obstructive sleep apnea)     on CPAP  . Presence of permanent cardiac pacemaker     2014- [facility]  . GERD (gastroesophageal reflux disease)   . Stomach ulcer   . Barrett's esophagus   . Peripheral neuropathy ([place])   . Mobitz type II atrioventricular block    Past Surgical History  Procedure Laterality Date  . Total abdominal hysterectomy  1980  . Pacemaker insertion  2012  . Cardiac catheterization  2007  . Hiatal hernia repair  2007  . Colonoscopy with propofol N/A 04/14/2015    Procedure: COLONOSCOPY WITH PROPOFOL;  Surgeon: Lollie Sails, MD;  Location: [facility] ENDOSCOPY;  Service: Endoscopy;  Laterality: N/A;  . Esophagogastroduodenoscopy N/A 04/14/2015    Procedure: ESOPHAGOGASTRODUODENOSCOPY (EGD);  Surgeon: Lollie Sails, MD;  Location: [facility] ENDOSCOPY;  Service: Endoscopy;  Laterality: N/A;  . Appendectomy  2006    Dr. Pat Patrick  . Nissen fundoplication  [id number]  . Laparoscopic right hemi colectomy Right 06/22/2015    Procedure: LAPAROSCOPIC RIGHT HEMI COLECTOMY;  Surgeon: Marlyce Huge, MD;  Location: [facility] ORS;  Service: General;  Laterality: Right;  . Colon surgery     Family History  Problem Relation Age of Onset  . Adopted: Yes  . Family  history unknown: Yes   Social History  Substance Use Topics  . Smoking status: Current Every Day Smoker -- 0.50 packs/day for 55 years    Types: Cigarettes  . Smokeless tobacco: Never Used  . Alcohol Use: No     Comment: occcasionall 1/2 glass wine a month   OB History    No data available     Review of Systems  Allergies  Contrast media and Statins  Home Medications   Prior to Admission medications   Medication Sig Start Date End Date Taking? Authorizing Provider  acetaminophen (TYLENOL) 500 MG tablet Take 1,000 mg by mouth every 4 (four) hours as needed for mild pain or headache.    Yes Historical Provider, MD  budesonide-formoterol (SYMBICORT) 160-4.5 MCG/ACT inhaler Inhale 2 puffs into the lungs 2 (two) times daily.    Yes Historical Provider, MD  gabapentin (NEURONTIN) 100 MG capsule Take 200 mg by mouth 3 (three) times daily.    Yes Historical Provider, MD  hydrochlorothiazide (HYDRODIURIL) 25 MG tablet Take 1 tablet (25 mg total) by mouth daily. 08/10/15  Yes Richard Maceo Pro., MD  losartan (COZAAR) 50 MG tablet Take 1 tablet (50 mg total) by mouth at bedtime. 08/10/15  Yes Richard Maceo Pro., MD  pantoprazole (PROTONIX) 40 MG tablet Take 1 tablet (40 mg total) by mouth 2 (two) times daily. 08/10/15  Yes Richard Maceo Pro., MD  traZODone (DESYREL) 150 MG tablet Take 1  tablet by mouth  every night at bedtime 12/29/15  Yes Richard Maceo Pro., MD  albuterol (PROVENTIL HFA) 108 (90 BASE) MCG/ACT inhaler Inhale 2 puffs into the lungs every 6 (six) hours as needed for wheezing or shortness of breath.     Historical Provider, MD  albuterol (PROVENTIL) (2.5 MG/3ML) 0.083% nebulizer solution Take 3 mLs (2.5 mg total) by nebulization every 6 (six) hours as needed for wheezing or shortness of breath. 07/14/15   Theodoro Grist, MD  ALPRAZolam Duanne Moron) 0.25 MG tablet TAKE 1 TABLET BY MOUTH TWICE DAILY 07/17/15   Jerrol Banana., MD  citalopram (CELEXA) 10 MG tablet Take 1 tablet  (10 mg total) by mouth at bedtime. 08/10/15   Richard Maceo Pro., MD  fluticasone (FLONASE) 50 MCG/ACT nasal spray Place 2 sprays into both nostrils daily as needed for rhinitis.     Historical Provider, MD  guaiFENesin (MUCINEX) 600 MG 12 hr tablet Take 1 tablet (600 mg total) by mouth 2 (two) times daily. 07/14/15   Theodoro Grist, MD  HYDROcodone-acetaminophen (NORCO/VICODIN) 5-325 MG tablet Take 1-2 tablets by mouth every 6 (six) hours as needed. 01/22/16   Norval Gable, MD  hyoscyamine (LEVBID) 0.375 MG 12 hr tablet Take 0.375 mg by mouth 2 (two) times daily as needed for cramping.    Historical Provider, MD  magnesium oxide (MAG-OX) 400 MG tablet Take 400 mg by mouth 2 (two) times daily.    Historical Provider, MD  meclizine (ANTIVERT) 25 MG tablet Take 1 tablet by mouth 2 (two) times daily as needed for dizziness.  10/23/13   Historical Provider, MD  mirtazapine (REMERON) 30 MG tablet Take 30 mg by mouth at bedtime.    Historical Provider, MD  nicotine (NICODERM CQ - DOSED IN MG/24 HOURS) 21 mg/24hr patch Place 1 patch (21 mg total) onto the skin daily. 07/14/15   Theodoro Grist, MD  nicotine (NICOTROL) 10 MG inhaler Inhale 1 cartridge (1 continuous puffing total) into the lungs as needed for smoking cessation. 07/14/15   Theodoro Grist, MD  simethicone (MYLICON) 80 MG chewable tablet Chew 160 mg by mouth 2 (two) times daily as needed for flatulence.    Historical Provider, MD  Suvorexant (BELSOMRA) 10 MG TABS Take 10 mg by mouth at bedtime as needed (for sleep).    Historical Provider, MD  Tiotropium Bromide Monohydrate (SPIRIVA RESPIMAT) 1.25 MCG/ACT AERS  08/21/15   Historical Provider, MD  zolpidem (AMBIEN) 5 MG tablet TAKE 1 TABLET BY MOUTH AT BEDTIME AS NEEDED 09/17/15   Jerrol Banana., MD   Meds Ordered and Administered this Visit  Medications - No data to display  BP 134/64 mmHg  Pulse 81  Temp(Src) 98.4 F (36.9 C) (Oral)  Resp 18  Ht 5\' 4"  (1.626 m)  Wt 160 lb (72.576 kg)   BMI 27.45 kg/m2  SpO2 95%  LMP  (Approximate) No data found.   Physical Exam  Constitutional: She appears well-developed and well-nourished. No distress.  HENT:  Head: Normocephalic and atraumatic.  Right Ear: Tympanic membrane, external ear and ear canal normal.  Left Ear: Tympanic membrane, external ear and ear canal normal.  Nose: No mucosal edema, rhinorrhea, nose lacerations, sinus tenderness, nasal deformity, septal deviation or nasal septal hematoma. No epistaxis.  No foreign bodies.  Mouth/Throat: Uvula is midline, oropharynx is clear and moist and mucous membranes are normal. No oropharyngeal exudate, posterior oropharyngeal edema, posterior oropharyngeal erythema or tonsillar abscesses.  Eyes: Conjunctivae and EOM are normal.  Pupils are equal, round, and reactive to light. Right eye exhibits no discharge. Left eye exhibits no discharge. No scleral icterus.  Neck: Normal range of motion. Neck supple. No thyromegaly present.  Cardiovascular: Normal rate, regular rhythm and normal heart sounds.   Pulmonary/Chest: Effort normal and breath sounds normal. No respiratory distress. She has no wheezes. She has no rales. She exhibits tenderness (left mid and lower ribs).  Lymphadenopathy:    She has no cervical adenopathy.  Skin: No rash noted. She is not diaphoretic.  Nursing note and vitals reviewed.   ED Course  Procedures (including critical care time)  Labs Review Labs Reviewed - No data to display  Imaging Review Dg Chest 2 View  01/22/2016  CLINICAL DATA:  Cough for 4 days. EXAM: CHEST  2 VIEW COMPARISON:  07/13/2015 and 03/17/2015.  Chest CT from 08/04/2015. FINDINGS: Hyperexpansion is consistent with emphysema. The lungs are clear wiithout focal pneumonia, edema, pneumothorax or pleural effusion. 5 mm nodule in the lateral right lung is new since 03/17/2015 but was seen on previous CT. The cardiopericardial silhouette is within normal limits for size. Left dual lead permanent  pacemaker again noted. The visualized bony structures of the thorax are intact. IMPRESSION: 1. Emphysema without acute cardiopulmonary process. 2. 5 mm right lung nodule better characterized on CT scan of 08/04/2015 Electronically Signed   By: Misty Stanley M.D.   On: 01/22/2016 16:44     Visual Acuity Review  Right Eye Distance:   Left Eye Distance:   Bilateral Distance:    Right Eye Near:   Left Eye Near:    Bilateral Near:         MDM   1. Costochondritis    Discharge Medication List as of 01/22/2016  4:52 PM     Discharge Medication List as of 01/22/2016  4:52 PM     1. x-ray results and diagnosis reviewed with patient 2. rx as per orders above; reviewed possible side effects, interactions, risks and benefits; rx given for vicodin 3. Recommend supportive treatment with rest, ice, otc analgesics prn 4. Follow-up prn if symptoms worsen or don't improve    Norval Gable, MD 01/22/16 2035

## 2016-01-22 NOTE — Discharge Instructions (Signed)

## 2016-01-22 NOTE — ED Notes (Signed)
Patient c/o rib-cage pain on left side after twisting and putting medicine on her dog and heard a pop 2-3 days ago.  States that she is also having trouble taking deep breathes, moving, and sleeping.  Denies fever c/n/v or chest pain.  She has been taking Tylenol and Ibuprofen, but they do not seem to be helping with her pain.

## 2016-02-02 ENCOUNTER — Ambulatory Visit
Admission: RE | Admit: 2016-02-02 | Discharge: 2016-02-02 | Disposition: A | Discharge status: Home / Self Care | Payer: Medicare Other | Source: Ambulatory Visit | Attending: Pulmonary Disease | Admitting: Pulmonary Disease

## 2016-02-02 DIAGNOSIS — R911 Solitary pulmonary nodule: Secondary | ICD-10-CM

## 2016-02-02 DIAGNOSIS — Z85038 Personal history of other malignant neoplasm of large intestine: Secondary | ICD-10-CM | POA: Diagnosis not present

## 2016-02-02 DIAGNOSIS — D3501 Benign neoplasm of right adrenal gland: Secondary | ICD-10-CM | POA: Diagnosis not present

## 2016-02-02 DIAGNOSIS — I2510 Atherosclerotic heart disease of native coronary artery without angina pectoris: Secondary | ICD-10-CM | POA: Diagnosis not present

## 2016-02-02 DIAGNOSIS — Z950 Presence of cardiac pacemaker: Secondary | ICD-10-CM | POA: Insufficient documentation

## 2016-02-02 DIAGNOSIS — R918 Other nonspecific abnormal finding of lung field: Secondary | ICD-10-CM

## 2016-02-02 DIAGNOSIS — J439 Emphysema, unspecified: Secondary | ICD-10-CM | POA: Diagnosis not present

## 2016-02-05 ENCOUNTER — Telehealth: Payer: Self-pay | Admitting: Pulmonary Disease

## 2016-02-05 NOTE — Telephone Encounter (Addendum)
Notes Recorded by Juanito Doom, MD on 02/02/2016 at 3:43 PM A, Please let the patient know this was OK Thanks, B -----  ATC pt, received fast busy signal. Had shelby try calling pt as well and she received same thing [facility]

## 2016-02-08 DIAGNOSIS — R911 Solitary pulmonary nodule: Secondary | ICD-10-CM | POA: Diagnosis not present

## 2016-02-08 DIAGNOSIS — J449 Chronic obstructive pulmonary disease, unspecified: Secondary | ICD-10-CM | POA: Diagnosis not present

## 2016-02-08 DIAGNOSIS — G4733 Obstructive sleep apnea (adult) (pediatric): Secondary | ICD-10-CM | POA: Diagnosis not present

## 2016-02-08 DIAGNOSIS — H26491 Other secondary cataract, right eye: Secondary | ICD-10-CM | POA: Diagnosis not present

## 2016-02-08 NOTE — Telephone Encounter (Signed)
ATC, fast busy signal, wcb

## 2016-02-09 NOTE — Telephone Encounter (Signed)
Attempted to call pt. Received fast busy signal x2. Will try back. 

## 2016-02-10 NOTE — Telephone Encounter (Signed)
Called and spoke with the pt and notified of results per BQ  She verbalized understanding  Nothing further needed

## 2016-02-22 ENCOUNTER — Encounter: Payer: Self-pay | Admitting: Family Medicine

## 2016-02-22 ENCOUNTER — Ambulatory Visit (INDEPENDENT_AMBULATORY_CARE_PROVIDER_SITE_OTHER): Payer: Medicare Other | Admitting: Family Medicine

## 2016-02-22 VITALS — BP 132/64 | HR 94 | Temp 98.0°F | Resp 18 | Ht 64.0 in | Wt 171.0 lb

## 2016-02-22 DIAGNOSIS — E7800 Pure hypercholesterolemia, unspecified: Secondary | ICD-10-CM | POA: Diagnosis not present

## 2016-02-22 DIAGNOSIS — Z0000 Encounter for general adult medical examination without abnormal findings: Secondary | ICD-10-CM

## 2016-02-22 DIAGNOSIS — Z720 Tobacco use: Secondary | ICD-10-CM

## 2016-02-22 DIAGNOSIS — I10 Essential (primary) hypertension: Secondary | ICD-10-CM

## 2016-02-22 DIAGNOSIS — E669 Obesity, unspecified: Secondary | ICD-10-CM

## 2016-02-22 DIAGNOSIS — J449 Chronic obstructive pulmonary disease, unspecified: Secondary | ICD-10-CM

## 2016-02-22 DIAGNOSIS — M810 Age-related osteoporosis without current pathological fracture: Secondary | ICD-10-CM | POA: Diagnosis not present

## 2016-02-22 NOTE — Progress Notes (Signed)
Patient ID: Crystal White, female   DOB: [birth year], 75 y.o.   MRN: [id number] Visit Date: 02/22/2016  Today's Provider: [name], MD   Chief Complaint  Patient presents with  . Medicare Wellness   Subjective:   Crystal White is a 75 y.o. female who presents today for her Subsequent Annual Wellness Visit. She feels fairly well. She reports she is not exercising, other than taking the dog outside. She reports she is sleeping fairly well. Overall patient feels well. She states her sleep apnea is well controlled and she uses her CPAP nightly 90% of the time. She continues to smoke and no she needs to quit. She has chronic insomnia and states that she must have her medications to sleep at all. She states she does take her medications as prescribed for her chronic conditions such as hypertension and hyperlipidemia. Her lung disease is stable Colonoscopy- 04/14/15 tubular adenoma barrett's esophagus inflamed mammogram- 03/29/11? BMD-  Immunization History  Administered Date(s) Administered  . Influenza Whole 08/28/2012  . Influenza, High Dose Seasonal PF 08/10/2015  . Influenza-Unspecified 08/28/2013  . Pneumococcal Conjugate-13 08/28/2014  . Pneumococcal Polysaccharide-23 03/07/2011  . Tdap 08/28/2014  . Zoster 11/04/2012     Review of Systems  Constitutional: Negative.   HENT: Negative.   Eyes: Negative.   Respiratory: Positive for cough.   Cardiovascular: Negative.   Gastrointestinal: Negative.   Endocrine: Positive for polydipsia.  Genitourinary: Negative.   Musculoskeletal: Positive for arthralgias.  Skin: Negative.   Allergic/Immunologic: Negative.   Neurological: Positive for dizziness (when standing, last week. Has resolved.).  Hematological: Negative.   Psychiatric/Behavioral: Negative.     Patient Active Problem List   Diagnosis Date Noted  . Musculoskeletal chest pain 07/14/2015  . Chronic respiratory failure ([place]) 07/14/2015  . Acute bronchitis 07/14/2015   . Other emphysema ([place]) 07/14/2015  . Chronic renal insufficiency 07/14/2015  . Chest pain 07/13/2015  . Multiple pulmonary nodules 06/02/2015  . Colon polyp 05/29/2015  . Hemoptysis 04/29/2015  . Sprain of ankle 04/14/2015  . Anxiety 04/14/2015  . Barrett's esophagus 04/14/2015  . Acute exacerbation of chronic obstructive airways disease ([place]) 04/14/2015  . Bursitis 04/14/2015  . Cellulitis 04/14/2015  . Claudication ([place]) 04/14/2015  . Cough 04/14/2015  . Deficiency, 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  . Clinical depression 04/14/2015  . Dizziness 04/14/2015  . Dyslipidemia 04/14/2015  . Essential (primary) hypertension 04/14/2015  . Flank pain 04/14/2015  . H/O suicide attempt 04/14/2015  . Dysphonia 04/14/2015  . Hypercholesteremia 04/14/2015  . Cannot sleep 04/14/2015  . Malaise and fatigue 04/14/2015  . Mild cognitive impairment 04/14/2015  . Cramp in muscle 04/14/2015  . Muscle ache 04/14/2015  . Neuropathy ([place]) 04/14/2015  . Adiposity 04/14/2015  . 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 Candida infection of mouth 04/14/2015  . Abnormal loss of weight 04/14/2015  . Decreased body weight 04/14/2015  . Artificial cardiac pacemaker 10/08/2014  . Apnea, sleep 10/08/2014  . COPD (chronic obstructive pulmonary disease) ([place]) 02/13/2012  . Tobacco abuse 02/13/2012  . Sinus congestion 02/13/2012  . Current tobacco use 02/13/2012  . Esophagitis, reflux 08/28/2006  . Abdominal pain, right lower quadrant 08/22/2005  . Acute appendicitis with peritoneal abscess 08/22/2005    Social History   Social History  . Marital Status: Widowed    Spouse Name: widowed  . Number of Children: Y  . Years of Education: N/A   Occupational History  . retired Personal assistant    Social History Main Topics  . Smoking  status: Current Every Day Smoker -- 0.50 packs/day for 55 years    Types: Cigarettes  . Smokeless tobacco: Never Used  . Alcohol Use: No     Comment: occcasionall 1/2 glass  wine a month  . Drug Use: No  . Sexual Activity: No   Other Topics Concern  . Not on file   Social History Narrative   Pt was adopted.    Lives at home by herself.   Not on home oxygen.    Past Surgical History  Procedure Laterality Date  . Total abdominal hysterectomy  1980  . Pacemaker insertion  2012  . Cardiac catheterization  2007  . Hiatal hernia repair  2007  . Colonoscopy with propofol N/A 04/14/2015    Procedure: COLONOSCOPY WITH PROPOFOL;  Surgeon: Lollie Sails, MD;  [facility] ENDOSCOPY;  Service: Endoscopy;  Laterality: N/A;  . Esophagogastroduodenoscopy N/A 04/14/2015    Procedure: ESOPHAGOGASTRODUODENOSCOPY (EGD);  Surgeon: Lollie Sails, MD;  Location: [facility] ENDOSCOPY;  Service: Endoscopy;  Laterality: N/A;  . Appendectomy  2006    Dr. Pat Patrick  . Nissen fundoplication  [id number]  . Laparoscopic right hemi colectomy Right 06/22/2015    Procedure: LAPAROSCOPIC RIGHT HEMI COLECTOMY;  Surgeon: Marlyce Huge, MD;  Location: [facility] ORS;  Service: General;  Laterality: Right;  . Colon surgery      Her She was adopted. Family history is unknown by patient.    Outpatient Prescriptions Prior to Visit  Medication Sig Dispense Refill  . acetaminophen (TYLENOL) 500 MG tablet Take 1,000 mg by mouth every 4 (four) hours as needed for mild pain or headache.     . albuterol (PROVENTIL HFA) 108 (90 BASE) MCG/ACT inhaler Inhale 2 puffs into the lungs every 6 (six) hours as needed for wheezing or shortness of breath.     Marland Kitchen albuterol (PROVENTIL) (2.5 MG/3ML) 0.083% nebulizer solution Take 3 mLs (2.5 mg total) by nebulization every 6 (six) hours as needed for wheezing or shortness of breath. 75 mL 12  . budesonide-formoterol (SYMBICORT) 160-4.5 MCG/ACT inhaler Inhale 2 puffs into the lungs 2 (two) times daily.     . citalopram (CELEXA) 10 MG tablet Take 1 tablet (10 mg total) by mouth at bedtime. 90 tablet 3  . fluticasone (FLONASE) 50 MCG/ACT nasal spray Place 2 sprays into  both nostrils daily as needed for rhinitis.     Marland Kitchen gabapentin (NEURONTIN) 100 MG capsule Take 200 mg by mouth 3 (three) times daily.     Marland Kitchen guaiFENesin (MUCINEX) 600 MG 12 hr tablet Take 1 tablet (600 mg total) by mouth 2 (two) times daily. 20 tablet 2  . hydrochlorothiazide (HYDRODIURIL) 25 MG tablet Take 1 tablet (25 mg total) by mouth daily. 90 tablet 3  . HYDROcodone-acetaminophen (NORCO/VICODIN) 5-325 MG tablet Take 1-2 tablets by mouth every 6 (six) hours as needed. 15 tablet 0  . hyoscyamine (LEVBID) 0.375 MG 12 hr tablet Take 0.375 mg by mouth 2 (two) times daily as needed for cramping.    Marland Kitchen losartan (COZAAR) 50 MG tablet Take 1 tablet (50 mg total) by mouth at bedtime. 90 tablet 3  . magnesium oxide (MAG-OX) 400 MG tablet Take 400 mg by mouth 2 (two) times daily.    . meclizine (ANTIVERT) 25 MG tablet Take 1 tablet by mouth 2 (two) times daily as needed for dizziness.     . mirtazapine (REMERON) 30 MG tablet Take 30 mg by mouth at bedtime.    . nicotine (NICODERM CQ - DOSED IN  MG/24 HOURS) 21 mg/24hr patch Place 1 patch (21 mg total) onto the skin daily. 28 patch 0  . nicotine (NICOTROL) 10 MG inhaler Inhale 1 cartridge (1 continuous puffing total) into the lungs as needed for smoking cessation. 42 each 0  . pantoprazole (PROTONIX) 40 MG tablet Take 1 tablet (40 mg total) by mouth 2 (two) times daily. 180 tablet 3  . simethicone (MYLICON) 80 MG chewable tablet Chew 160 mg by mouth 2 (two) times daily as needed for flatulence.    . Suvorexant (BELSOMRA) 10 MG TABS Take 10 mg by mouth at bedtime as needed (for sleep).    . Tiotropium Bromide Monohydrate (SPIRIVA RESPIMAT) 1.25 MCG/ACT AERS     . traZODone (DESYREL) 150 MG tablet Take 1 tablet by mouth  every night at bedtime 90 tablet 3  . zolpidem (AMBIEN) 5 MG tablet TAKE 1 TABLET BY MOUTH AT BEDTIME AS NEEDED 30 tablet 3  . ALPRAZolam (XANAX) 0.25 MG tablet TAKE 1 TABLET BY MOUTH TWICE DAILY 60 tablet 2   No facility-administered  medications prior to visit.    Allergies  Allergen Reactions  . Contrast Media [Iodinated Diagnostic Agents] Other (See Comments)    Reaction:  Burning and redness   . Statins Other (See Comments)    Reaction:  Leg cramps     Patient Care Team: Jerrol Banana., MD as PCP - General (Unknown Physician Specialty)  Objective:   Vitals: There were no vitals filed for this visit.  Physical Exam  Constitutional: She is oriented to person, place, and time. She appears well-developed and well-nourished.  HENT:  Head: Normocephalic and atraumatic.  Right Ear: External ear normal.  Left Ear: External ear normal.  Nose: Nose normal.  Mouth/Throat: Oropharynx is clear and moist.  Eyes: Conjunctivae and EOM are normal. Pupils are equal, round, and reactive to light.  Neck: Normal range of motion. Neck supple.  Cardiovascular: Normal rate, regular rhythm, normal heart sounds and intact distal pulses.   Pulmonary/Chest: Effort normal and breath sounds normal.  Abdominal: Soft. Bowel sounds are normal.  Musculoskeletal: Normal range of motion.  Neurological: She is alert and oriented to person, place, and time. She has normal reflexes.  Skin: Skin is warm and dry.  Psychiatric: She has a normal mood and affect. Her behavior is normal. Judgment and thought content normal.    Activities of Daily Living In your present state of health, do you have any difficulty performing the following activities: 10/19/2015 08/10/2015  Hearing? N N  Vision? N N  Difficulty concentrating or making decisions? N N  Walking or climbing stairs? N N  Dressing or bathing? N N  Doing errands, shopping? N N    Fall Risk Assessment Fall Risk  10/19/2015 08/10/2015  Falls in the past year? No No     Depression Screen PHQ 2/9 Scores 10/19/2015 08/10/2015  PHQ - 2 Score 0 0    Cognitive Testing - 6-CIT    Year: 0 points  Month: 0 points  Memorize "Pia Mau, [address], [place]"  Time (within 1  hour:) 0 points  Count backwards from 20: 0 points  Name months of year: 0 points  Repeat Address: 4 points   Total Score: 4/28  Interpretation : Normal (0-7) Abnormal (8-28)    Assessment & Plan:     Annual Wellness Visit  Reviewed patient's Family Medical History Reviewed and updated list of patient's medical providers Assessment of cognitive impairment was done Assessed patient's functional  ability Established a written schedule for health screening [place] Completed and Reviewed  Exercise Activities and Dietary recommendations Goals    None      Immunization History  Administered Date(s) Administered  . Influenza Whole 08/28/2012  . Influenza, High Dose Seasonal PF 08/10/2015  . Influenza-Unspecified 08/28/2013  . Pneumococcal Conjugate-13 08/28/2014  . Pneumococcal Polysaccharide-23 03/07/2011  . Tdap 08/28/2014  . Zoster 11/04/2012    Health Maintenance  Topic Date Due  . DEXA SCAN  11/06/2007  . MAMMOGRAM  02/11/2011  . INFLUENZA VACCINE  06/28/2016  . TETANUS/TDAP  08/28/2024  . COLONOSCOPY  04/13/2025  . ZOSTAVAX  Completed  . PNA vac Low Risk Adult  Completed   1. Medicare annual wellness visit, subsequent   2. Essential (primary) hypertension  - TSH - CBC with Differential/Platelet  3. Tobacco abuse Patient strongly advised to quit. 4. Hypercholesteremia  - Lipid Panel With LDL/HDL Ratio - Comprehensive metabolic panel  5. Adiposity   6. Osteoporosis  - DG Bone Density; Future  7. Chronic obstructive pulmonary disease, unspecified COPD type  - Ambulatory referral to Pulmonology    Discussed health benefits of physical activity, and encouraged her to engage in regular exercise appropriate for her age and condition.   I have done the exam and reviewed the above chart and it is accurate to the best of my knowledge.  [name] MD [facility] 02/22/2016 1:59  PM  ------------------------------------------------------------------------------------------------------------

## 2016-02-25 ENCOUNTER — Encounter (INDEPENDENT_AMBULATORY_CARE_PROVIDER_SITE_OTHER): Payer: Self-pay

## 2016-02-25 ENCOUNTER — Ambulatory Visit (INDEPENDENT_AMBULATORY_CARE_PROVIDER_SITE_OTHER): Payer: Medicare Other | Admitting: Internal Medicine

## 2016-02-25 ENCOUNTER — Encounter: Payer: Self-pay | Admitting: Internal Medicine

## 2016-02-25 VITALS — BP 140/78 | HR 99 | Ht 64.5 in | Wt 170.6 lb

## 2016-02-25 DIAGNOSIS — R918 Other nonspecific abnormal finding of lung field: Secondary | ICD-10-CM

## 2016-02-25 MED ORDER — FLUTICASONE FUROATE-VILANTEROL 100-25 MCG/INH IN AEPB: 1.0000 | INHALATION_SPRAY | Freq: Every day | RESPIRATORY_TRACT | Status: AC

## 2016-02-25 MED ORDER — FLUTICASONE FUROATE-VILANTEROL 100-25 MCG/INH IN AEPB: 1.0000 | INHALATION_SPRAY | Freq: Every day | RESPIRATORY_TRACT | Status: DC

## 2016-02-25 NOTE — Patient Instructions (Addendum)
--  Repeat CT chest without contrast in 9 months; follow up after that.  --Stop symbicort and start Breo inhaler once daily.

## 2016-02-25 NOTE — Progress Notes (Signed)
[facility] Consultation      Assessment and Plan:  COPD/emphysema. -Patient frequently forgets to take her second dose of Symbicort, will switch to Breo 100 to be used as once daily, patient asked to stop taking Symbicort. -Discussed trying to increase her activity level due to presence of debility/deconditioning. -Patient had some mild desats from the normal range to. Mild hypoxia with activity. Per the LOTT study, no oxygen would be indicated at this time for stable COPD.  Lung nodules. -Reviewed, most recent CT of the chest images from 02/02/2015, lung nodules appear stable, since 04/29/2015. -We'll repeat CT chest in approximately 9 months, if stable at that time, patient will have completed about 18 months of surveillance.  Nicotine abuse. -Discussed the importance of smoking cessation, she does not feel that she is ready to quit at this time.  Sleep apnea.  -Continue with CPAP at 11, oxygen via CPAP.   Date: 02/25/2016  MRN# [id number] MAGDA BOLENBAUGH 1941/01/13  Referring Physician:   JESSILYN LORENSON is a 75 y.o. old female seen in consultation for chief complaint of:    Chief Complaint  Patient presents with  . Follow-up    former BQ pt. seen for COPD. c/o occ SOB, prod cough clear to yellow in color, wheezing. denies chest pain/tigthness.    HPI:   The patient is a 75 year old female with a history of COPD, obstructive sleep apnea, hypertension, GERD.  She was seen by Dr. Lake Bells in 06/02/2015. She was seen at that time for follow-up from hemoptysis and pulmonary nodules. At that time it appeared that the hemoptysis resolved, she had a CT of the chest on 04/29/2015 which showed bilateral emphysema and multiple pulmonary nodules, the largest of which was 7 mm.   She was subsequently recommended to undergo repeat CT chest in 3 months. She was also noted to have been diagnosed with colon cancer, and has undergone a hemicolectomy. I reviewed her most  recent CT chest findings from 02/02/2016, shows bilateral emphysematous changes in both lungs, with small pulmonary nodules. She had been seen by Dr. Humphrey Rolls after a hospitalization, she has had a sleep study and PFT done. She is known to have OSA, she is using CPAP @ 11, but still has occasional trouble adjusting to it and once in while will take it off and not put it back on. She notes that she is more awake during the day since using it.   She is currently using oxygen at night via CPAP, not sure about the O2 flow rate. She feels that her breathing is doing ok, but she does get winded with house cleaning or with grocery shopping.  She is currently using symbicort 2 puffs once daily, and feels that it is helping. She usually forgets the second dose. She uses no other inhalers. She has a nebulizer which she uses only she feels a cold coming on.   She is smoking half to 1 ppd.   She was ambulated around the office today, baseline O2 was 89% on RA with HR of 82. With walking 600 feet at a moderate pace sat dropped to 85% and HR 127, with rest,improved to 89% and HR 95.   Review of testing/summary: CT chest 02/02/2016:Stable sub-cm bilateral pulmonary nodules  CT chest 08/04/2015: Minimally changed, pulmonary nodules, the largest of which was 7 mm. No significant change when compared with previous CT film from 04/29/2015.  CPAP titration study 09/15/2015: Requires CPAP at a pressure of 11. Oxygen  desaturations were noted. Sleep study 08/11/2015: Mild sleep apnea with an apnea-hypopnea index of 14.    PMHX:   Past Medical History  Diagnosis Date  . Hypertension   . COPD (chronic obstructive pulmonary disease) ([place])   . Hyperlipidemia   . OSA (obstructive sleep apnea)     on CPAP  . Presence of permanent cardiac pacemaker     2014- [facility]  . GERD (gastroesophageal reflux disease)   . Stomach ulcer   . Barrett's esophagus   . Peripheral neuropathy ([place])   . Mobitz type II atrioventricular  block    Surgical Hx:  Past Surgical History  Procedure Laterality Date  . Total abdominal hysterectomy  1980  . Pacemaker insertion  2012  . Cardiac catheterization  2007  . Hiatal hernia repair  2007  . Colonoscopy with propofol N/A 04/14/2015    Procedure: COLONOSCOPY WITH PROPOFOL;  Surgeon: Lollie Sails, MD;  Location: [facility] ENDOSCOPY;  Service: Endoscopy;  Laterality: N/A;  . Esophagogastroduodenoscopy N/A 04/14/2015    Procedure: ESOPHAGOGASTRODUODENOSCOPY (EGD);  Surgeon: Lollie Sails, MD;  Location: [facility] ENDOSCOPY;  Service: Endoscopy;  Laterality: N/A;  . Appendectomy  2006    Dr. Pat Patrick  . Nissen fundoplication  [id number]  . Laparoscopic right hemi colectomy Right 06/22/2015    Procedure: LAPAROSCOPIC RIGHT HEMI COLECTOMY;  Surgeon: Marlyce Huge, MD;  Location: [facility] ORS;  Service: General;  Laterality: Right;  . Colon surgery     Family Hx:  Family History  Problem Relation Age of Onset  . Adopted: Yes  . Family history unknown: Yes   Social Hx:   Social History  Substance Use Topics  . Smoking status: Current Every Day Smoker -- 0.50 packs/day for 55 years    Types: Cigarettes  . Smokeless tobacco: Never Used  . Alcohol Use: No     Comment: occcasionall 1/2 glass wine a month   Medication:   Current Outpatient Rx  Name  Route  Sig  Dispense  Refill  . acetaminophen (TYLENOL) 500 MG tablet   Oral   Take 1,000 mg by mouth every 4 (four) hours as needed for mild pain or headache.          . albuterol (PROVENTIL HFA) 108 (90 BASE) MCG/ACT inhaler   Inhalation   Inhale 2 puffs into the lungs every 6 (six) hours as needed for wheezing or shortness of breath.          . ALPRAZolam (XANAX) 0.25 MG tablet      TAKE 1 TABLET BY MOUTH TWICE DAILY   60 tablet   2   . budesonide-formoterol (SYMBICORT) 160-4.5 MCG/ACT inhaler   Inhalation   Inhale 2 puffs into the lungs 2 (two) times daily.          . citalopram (CELEXA) 10 MG tablet   Oral   Take  1 tablet (10 mg total) by mouth at bedtime.   90 tablet   3   . fluticasone (FLONASE) 50 MCG/ACT nasal spray   Each Nare   Place 2 sprays into both nostrils daily as needed for rhinitis.          Marland Kitchen gabapentin (NEURONTIN) 100 MG capsule   Oral   Take 200 mg by mouth 3 (three) times daily.          Marland Kitchen guaiFENesin (MUCINEX) 600 MG 12 hr tablet   Oral   Take 1 tablet (600 mg total) by mouth 2 (two) times daily.   20 tablet  2   . hydrochlorothiazide (HYDRODIURIL) 25 MG tablet   Oral   Take 1 tablet (25 mg total) by mouth daily.   90 tablet   3   . HYDROcodone-acetaminophen (NORCO/VICODIN) 5-325 MG tablet   Oral   Take 1-2 tablets by mouth every 6 (six) hours as needed.   15 tablet   0   . hyoscyamine (LEVBID) 0.375 MG 12 hr tablet   Oral   Take 0.375 mg by mouth 2 (two) times daily as needed for cramping.         Marland Kitchen losartan (COZAAR) 50 MG tablet   Oral   Take 1 tablet (50 mg total) by mouth at bedtime.   90 tablet   3   . magnesium oxide (MAG-OX) 400 MG tablet   Oral   Take 400 mg by mouth 2 (two) times daily.         . meclizine (ANTIVERT) 25 MG tablet   Oral   Take 1 tablet by mouth 2 (two) times daily as needed for dizziness.          . mirtazapine (REMERON) 30 MG tablet   Oral   Take 30 mg by mouth at bedtime.         . nicotine (NICODERM CQ - DOSED IN MG/24 HOURS) 21 mg/24hr patch   Transdermal   Place 1 patch (21 mg total) onto the skin daily.   28 patch   0   . nicotine (NICOTROL) 10 MG inhaler   Inhalation   Inhale 1 cartridge (1 continuous puffing total) into the lungs as needed for smoking cessation.   42 each   0   . pantoprazole (PROTONIX) 40 MG tablet   Oral   Take 1 tablet (40 mg total) by mouth 2 (two) times daily.   180 tablet   3   . simethicone (MYLICON) 80 MG chewable tablet   Oral   Chew 160 mg by mouth 2 (two) times daily as needed for flatulence.         . Suvorexant (BELSOMRA) 10 MG TABS   Oral   Take 10 mg  by mouth at bedtime as needed (for sleep).         . Tiotropium Bromide Monohydrate (SPIRIVA RESPIMAT) 1.25 MCG/ACT AERS               . traZODone (DESYREL) 150 MG tablet      Take 1 tablet by mouth  every night at bedtime   90 tablet   3   . zolpidem (AMBIEN) 5 MG tablet      TAKE 1 TABLET BY MOUTH AT BEDTIME AS NEEDED Patient not taking: Reported on 02/22/2016   30 tablet   3       Allergies:  Contrast media and Statins  Review of Systems: Gen:  Denies  fever, sweats, chills HEENT: Denies blurred vision, double vision. bleeds, sore throat Cvc:  No dizziness, chest pain. Resp:   Denies cough or sputum production, shortness of breath Gi: Denies swallowing difficulty, stomach pain. Gu:  Denies bladder incontinence, burning urine Ext:   No Joint pain, stiffness. Skin: No skin rash,  hives  Endoc:  No polyuria, polydipsia. Psych: No depression, insomnia. Other:  All other systems were reviewed with the patient and were negative other that what is mentioned in the HPI.   Physical Examination:   VS: BP 140/78 mmHg  Pulse 99  Ht 5' 4.5" (1.638 m)  Wt 170 lb 9.6 oz (77.384  kg)  BMI 28.84 kg/m2  SpO2 100%  LMP  (Approximate)  General Appearance: No distress  Neuro:without focal findings,  speech normal,  HEENT: PERRLA, EOM intact.   Pulmonary: decreased air entry bilaterally.   CardiovascularNormal S1,S2.  No m/r/g.   Abdomen: Benign, Soft, non-tender. Renal:  No costovertebral tenderness  GU:  No performed at this time. Endoc: No evident thyromegaly, no signs of acromegaly. Skin:   warm, no rashes, no ecchymosis  Extremities: normal, no cyanosis, clubbing.  Other findings:    LABORATORY PANEL:   CBC No results for input(s): WBC, HGB, HCT, PLT in the last 168 hours. ------------------------------------------------------------------------------------------------------------------  Chemistries  No results for input(s): NA, K, CL, CO2, GLUCOSE, BUN,  CREATININE, CALCIUM, MG, AST, ALT, ALKPHOS, BILITOT in the last 168 hours.  Invalid input(s): GFRCGP ------------------------------------------------------------------------------------------------------------------  Cardiac Enzymes No results for input(s): TROPONINI in the last 168 hours. ------------------------------------------------------------  RADIOLOGY:  No results found.     Thank  you for the consultation and for allowing [facility] to assist in the care of your patient. Our recommendations are noted above.  Please contact us if we can be of further service.   Marda Stalker, MD.  Board Certified in Internal Medicine, Pulmonary Medicine, [place], and Sleep Medicine.  [facility] Office Number: [phone]  Patricia Pesa, M.D.  Vilinda Boehringer, M.D.  Merton Border, M.D  02/25/2016

## 2016-03-10 ENCOUNTER — Ambulatory Visit
Admission: RE | Admit: 2016-03-10 | Discharge: 2016-03-10 | Disposition: A | Discharge status: Home / Self Care | Payer: Medicare Other | Source: Ambulatory Visit | Attending: Family Medicine | Admitting: Family Medicine

## 2016-03-10 ENCOUNTER — Ambulatory Visit: Payer: Medicare Other

## 2016-03-10 DIAGNOSIS — M818 Other osteoporosis without current pathological fracture: Secondary | ICD-10-CM | POA: Diagnosis not present

## 2016-03-10 DIAGNOSIS — M89751 Major osseous defect, right pelvic region and thigh: Secondary | ICD-10-CM | POA: Insufficient documentation

## 2016-03-10 DIAGNOSIS — M810 Age-related osteoporosis without current pathological fracture: Secondary | ICD-10-CM | POA: Diagnosis not present

## 2016-03-10 DIAGNOSIS — M8588 Other specified disorders of bone density and structure, other site: Secondary | ICD-10-CM | POA: Diagnosis not present

## 2016-03-10 DIAGNOSIS — M89752 Major osseous defect, left pelvic region and thigh: Secondary | ICD-10-CM | POA: Insufficient documentation

## 2016-03-10 DIAGNOSIS — Z780 Asymptomatic menopausal state: Secondary | ICD-10-CM | POA: Diagnosis not present

## 2016-03-15 ENCOUNTER — Telehealth: Payer: Self-pay

## 2016-03-15 DIAGNOSIS — M810 Age-related osteoporosis without current pathological fracture: Secondary | ICD-10-CM

## 2016-03-15 NOTE — Telephone Encounter (Signed)
Pt advised, patient never took Fosamax as far as she can remember. Referral to endocrinologist put in-aa

## 2016-03-15 NOTE — Telephone Encounter (Signed)
-----   Message from Jerrol Banana., MD sent at 03/15/2016 10:42 AM EDT ----- Refer to endocrine for treatment of osteoporosis. I do not think she could tolerate Fosamax due to her chronic GERD

## 2016-03-17 DIAGNOSIS — H26491 Other secondary cataract, right eye: Secondary | ICD-10-CM | POA: Diagnosis not present

## 2016-03-21 DIAGNOSIS — M810 Age-related osteoporosis without current pathological fracture: Secondary | ICD-10-CM | POA: Diagnosis not present

## 2016-04-07 DIAGNOSIS — H02831 Dermatochalasis of right upper eyelid: Secondary | ICD-10-CM | POA: Diagnosis not present

## 2016-04-17 ENCOUNTER — Other Ambulatory Visit: Payer: Self-pay | Admitting: Family Medicine

## 2016-04-20 ENCOUNTER — Other Ambulatory Visit: Payer: Self-pay | Admitting: Family Medicine

## 2016-04-21 DIAGNOSIS — I442 Atrioventricular block, complete: Secondary | ICD-10-CM | POA: Diagnosis not present

## 2016-04-28 ENCOUNTER — Encounter: Payer: Self-pay | Admitting: *Deleted

## 2016-04-28 NOTE — Anesthesia Preprocedure Evaluation (Addendum)
Anesthesia Evaluation  Patient identified by MRN, date of birth, ID band Patient awake    Reviewed: Allergy & Precautions, H&P , NPO status , Patient's Chart, lab work & pertinent test results, reviewed documented beta blocker date and time   Airway Mallampati: II  TM Distance: >3 FB Neck ROM: full    Dental no notable dental hx.    Pulmonary shortness of breath and with exertion, sleep apnea and Continuous Positive Airway Pressure Ventilation , COPD, Current Smoker,  Per pulmonology note 3/17 "Patient had some mild desats from the normal range to. Mild hypoxia with activity. Per the LOTT study, no oxygen would be indicated at this time for stable COPD."   Pulmonary exam normal breath sounds clear to auscultation       Cardiovascular Exercise Tolerance: Good hypertension, + dysrhythmias (mobitz 2 block) + pacemaker  Rhythm:regular Rate:Normal  Pacemaker interrogated by cardiologist on 04/21/16   Neuro/Psych PSYCHIATRIC DISORDERS (depression) negative neurological ROS     GI/Hepatic Neg liver ROS, PUD, GERD  ,  Endo/Other  negative endocrine ROS  Renal/GU negative Renal ROS  negative genitourinary   Musculoskeletal   Abdominal   Peds  Hematology negative hematology ROS (+)   Anesthesia Other Findings   Reproductive/Obstetrics negative OB ROS                             Anesthesia Physical Anesthesia Plan  ASA: III  Anesthesia Plan: MAC   Post-op Pain Management:    Induction:   Airway Management Planned:   Additional Equipment:   Intra-op Plan:   Post-operative Plan:   Informed Consent: I have reviewed the patients History and Physical, chart, labs and discussed the procedure including the risks, benefits and alternatives for the proposed anesthesia with the patient or authorized representative who has indicated his/her understanding and acceptance.   Dental Advisory  Given  Plan Discussed with: CRNA  Anesthesia Plan Comments:         Anesthesia Quick Evaluation

## 2016-04-29 NOTE — Discharge Instructions (Signed)
INSTRUCTIONS FOLLOWING OCULOPLASTIC SURGERY °AMY M. FOWLER, MD ° °AFTER YOUR EYE SURGERY, THER ARE MANY THINGS THWIHC YOU, THE PATIENT, CAN DO TO ASSURE THE BEST POSSIBLE RESULT FROM YOUR OPERATION.  THIS SHEET SHOULD BE REFERRED TO WHENEVER QUESTIONS ARISE.  IF THERE ARE ANY QUESTIONS NOT ANSWERED HERE, DO NOT HESITATE TO CALL OUR OFFICE AT [phone] OR [phone].  THERE IS ALWAYS [facility] AVAILABLE TO CALL IF QUESTIONS OR PROBLEMS ARISE. ° °VISION: Your vision may be blurred and out of focus after surgery until you are able to stop using your ointment, swelling resolves and your eye(s) heal. This may take 1 to 2 weeks at the least.  If your vision becomes gradually more dim or dark, this is not normal and you need to call our office immediately. ° °EYE CARE: For the first 48 hours after surgery, use ice packs frequently - “20 minutes on, 20 minutes off” - to help reduce swelling and bruising.  Small bags of frozen peas or corn make good ice packs along with cloths soaked in ice water.  If you are wearing a patch or other type of dressing following surgery, keep this on for the amount of time specified by your doctor.  For the first week following surgery, you will need to treat your stitches with great care.  If is OK to shower, but take care to not allow soapy water to run into your eye(s) to help reduce changes of infection.  You may gently clean the eyelashes and around the eye(s) with cotton balls and sterile water, BUT DO NOT RUB THE STITCHES VIGOROUSLY.  Keeping your stitches moist with ointment will help promote healing with minimal scar formation. ° °ACTIVITY: When you leave the surgery center, you should go home, rest and be inactive.  The eye(s) may feel scratchy and keeping the eyes closed will allow for faster healing.  The first week following surgery, avoid straining (anything making the face turn red) or lifting over 20 pounds.  Additionally, avoid bending which causes your head to go below  your waist.  Using your eyes will NOT harm them, so feel free to read, watch television, use the computer, etc as desired.  Driving depends on each individual, so check with your doctor if you have questions about driving. ° °MEDICATIONS:  You will be given a prescription for an ointment to use 4 times a day on your stitches.  You can use the ointment in your eyes if they feel scratchy or irritated.  If you eyelid(s) don’t close completely when you sleep, put some ointment in your eyes before bedtime. ° °EMERGENCY: If you experience SEVERE EYE PAIN OR HEADACHE UNRELIEVED BY TYLENOL OR PERCOCET, NAUSEA OR VOMITING, WORSENING REDNESS, OR WORSENING VISION (ESPECIALLY VISION THAT WA INITIALLY BETTER) CALL [phone] OR [phone] DURING BUSINESS HOURS OR AFTER HOURS. ° °General Anesthesia, Adult, Care After °Refer to this sheet in the next few weeks. These instructions provide you with information on caring for yourself after your procedure. Your health care provider may also give you more specific instructions. Your treatment has been planned according to current medical practices, but problems sometimes occur. Call your health care provider if you have any problems or questions after your procedure. °WHAT TO EXPECT AFTER THE PROCEDURE °After the procedure, it is typical to experience: °· Sleepiness. °· Nausea and vomiting. °HOME CARE INSTRUCTIONS °· For the first 24 hours after general anesthesia: °¨ Have a responsible person with you. °¨ Do not drive a car. If you   are alone, do not take public transportation. °¨ Do not drink alcohol. °¨ Do not take medicine that has not been prescribed by your health care provider. °¨ Do not sign important papers or make important decisions. °¨ You may resume a normal diet and activities as directed by your health care provider. °· Change bandages (dressings) as directed. °· If you have questions or problems that seem related to general anesthesia, call the hospital and ask for  the anesthetist or anesthesiologist on call. °SEEK MEDICAL CARE IF: °· You have nausea and vomiting that continue the day after anesthesia. °· You develop a rash. °SEEK IMMEDIATE MEDICAL CARE IF:  °· You have difficulty breathing. °· You have chest pain. °· You have any allergic problems. °  °This information is not intended to replace advice given to you by your health care provider. Make sure you discuss any questions you have with your health care provider. °  °Document Released: 02/20/2001 Document Revised: 12/05/2014 Document Reviewed: 03/14/2012 °Elsevier Interactive Patient Education ©2016 [employer] ° °

## 2016-05-03 ENCOUNTER — Ambulatory Visit
Admission: RE | Admit: 2016-05-03 | Discharge: 2016-05-03 | Disposition: A | Discharge status: Home / Self Care | Payer: Medicare Other | Source: Ambulatory Visit | Attending: Ophthalmology | Admitting: Ophthalmology

## 2016-05-03 ENCOUNTER — Encounter
Admission: RE | Disposition: A | Discharge status: Home / Self Care | Payer: Self-pay | Source: Ambulatory Visit | Attending: Ophthalmology

## 2016-05-03 ENCOUNTER — Ambulatory Visit: Payer: Medicare Other | Admitting: Anesthesiology

## 2016-05-03 DIAGNOSIS — H02834 Dermatochalasis of left upper eyelid: Secondary | ICD-10-CM | POA: Insufficient documentation

## 2016-05-03 DIAGNOSIS — G4730 Sleep apnea, unspecified: Secondary | ICD-10-CM | POA: Diagnosis not present

## 2016-05-03 DIAGNOSIS — Z79899 Other long term (current) drug therapy: Secondary | ICD-10-CM | POA: Diagnosis not present

## 2016-05-03 DIAGNOSIS — H02831 Dermatochalasis of right upper eyelid: Secondary | ICD-10-CM | POA: Diagnosis not present

## 2016-05-03 DIAGNOSIS — Z91041 Radiographic dye allergy status: Secondary | ICD-10-CM | POA: Diagnosis not present

## 2016-05-03 DIAGNOSIS — I10 Essential (primary) hypertension: Secondary | ICD-10-CM | POA: Insufficient documentation

## 2016-05-03 DIAGNOSIS — Z87891 Personal history of nicotine dependence: Secondary | ICD-10-CM | POA: Insufficient documentation

## 2016-05-03 DIAGNOSIS — Z9981 Dependence on supplemental oxygen: Secondary | ICD-10-CM | POA: Diagnosis not present

## 2016-05-03 DIAGNOSIS — Z9049 Acquired absence of other specified parts of digestive tract: Secondary | ICD-10-CM | POA: Insufficient documentation

## 2016-05-03 DIAGNOSIS — Z950 Presence of cardiac pacemaker: Secondary | ICD-10-CM | POA: Insufficient documentation

## 2016-05-03 DIAGNOSIS — H02403 Unspecified ptosis of bilateral eyelids: Secondary | ICD-10-CM | POA: Insufficient documentation

## 2016-05-03 DIAGNOSIS — Z9889 Other specified postprocedural states: Secondary | ICD-10-CM | POA: Insufficient documentation

## 2016-05-03 DIAGNOSIS — J439 Emphysema, unspecified: Secondary | ICD-10-CM | POA: Diagnosis not present

## 2016-05-03 DIAGNOSIS — L908 Other atrophic disorders of skin: Secondary | ICD-10-CM | POA: Diagnosis not present

## 2016-05-03 DIAGNOSIS — G629 Polyneuropathy, unspecified: Secondary | ICD-10-CM | POA: Diagnosis not present

## 2016-05-03 DIAGNOSIS — Z7951 Long term (current) use of inhaled steroids: Secondary | ICD-10-CM | POA: Diagnosis not present

## 2016-05-03 DIAGNOSIS — Z90710 Acquired absence of both cervix and uterus: Secondary | ICD-10-CM | POA: Diagnosis not present

## 2016-05-03 HISTORY — DX: Unspecified osteoarthritis, unspecified site: M19.90

## 2016-05-03 HISTORY — DX: Emphysema, unspecified: J43.9

## 2016-05-03 HISTORY — DX: Reserved for inherently not codable concepts without codable children: IMO0001

## 2016-05-03 HISTORY — PX: BROW LIFT: SHX178

## 2016-05-03 SURGERY — BLEPHAROPLASTY
Anesthesia: Monitor Anesthesia Care | Site: Eye | Laterality: Bilateral | Wound class: Clean

## 2016-05-03 MED ORDER — TETRACAINE HCL 0.5 % OP SOLN
OPHTHALMIC | Status: DC | PRN
  Administered 2016-05-03: 8 [drp] via OPHTHALMIC

## 2016-05-03 MED ORDER — LACTATED RINGERS IV SOLN
INTRAVENOUS | Status: DC
  Administered 2016-05-03: 09:00:00 via INTRAVENOUS

## 2016-05-03 MED ORDER — ALFENTANIL 500 MCG/ML IJ INJ
INJECTION | INTRAMUSCULAR | Status: DC | PRN
  Administered 2016-05-03 (×2): 500 ug via INTRAVENOUS

## 2016-05-03 MED ORDER — MIDAZOLAM HCL 2 MG/2ML IJ SOLN
INTRAMUSCULAR | Status: DC | PRN
Start: 2016-05-03 — End: 2016-05-03
  Administered 2016-05-03 (×2): 0.5 mg via INTRAVENOUS
  Administered 2016-05-03: 1 mg via INTRAVENOUS

## 2016-05-03 MED ORDER — HYDROMORPHONE HCL 1 MG/ML IJ SOLN: 0.2500 mg | INTRAMUSCULAR | Status: DC | PRN

## 2016-05-03 MED ORDER — LIDOCAINE-EPINEPHRINE 2 %-1:100000 IJ SOLN
INTRAMUSCULAR | Status: DC | PRN
  Administered 2016-05-03: 9.5 mL via OPHTHALMIC

## 2016-05-03 MED ORDER — BSS IO SOLN
INTRAOCULAR | Status: DC | PRN
  Administered 2016-05-03: 15 mL

## 2016-05-03 MED ORDER — BACITRACIN 500 UNIT/GM OP OINT: TOPICAL_OINTMENT | OPHTHALMIC | Status: DC

## 2016-05-03 MED ORDER — HYDROCODONE-ACETAMINOPHEN 7.5-325 MG PO TABS: 1.0000 | ORAL_TABLET | Freq: Once | ORAL | Status: DC | PRN

## 2016-05-03 MED ORDER — OXYCODONE-ACETAMINOPHEN 5-325 MG PO TABS: 1.0000 | ORAL_TABLET | ORAL | Status: DC | PRN

## 2016-05-03 MED ORDER — ERYTHROMYCIN 5 MG/GM OP OINT
TOPICAL_OINTMENT | OPHTHALMIC | Status: DC | PRN
Start: 2016-05-03 — End: 2016-05-03
  Administered 2016-05-03: 1 via OPHTHALMIC

## 2016-05-03 MED ORDER — ONDANSETRON HCL 4 MG/2ML IJ SOLN: 4.0000 mg | Freq: Once | INTRAMUSCULAR | Status: DC | PRN

## 2016-05-03 SURGICAL SUPPLY — 36 items
APPLICATOR COTTON TIP WD 3 STR (MISCELLANEOUS) ×6 IMPLANT
BLADE SURG 15 STRL LF DISP TIS (BLADE) ×2 IMPLANT
BLADE SURG 15 STRL SS (BLADE) ×1
CORD BIP STRL DISP 12FT (MISCELLANEOUS) ×3 IMPLANT
DRAPE HEAD BAR (DRAPES) ×3 IMPLANT
GAUZE SPONGE 4X4 12PLY STRL (GAUZE/BANDAGES/DRESSINGS) ×3 IMPLANT
GAUZE SPONGE NON-WVN 2X2 STRL (MISCELLANEOUS) ×20 IMPLANT
GLOVE SURG LX 7.0 MICRO (GLOVE) ×2
GLOVE SURG LX STRL 7.0 MICRO (GLOVE) ×4 IMPLANT
MARKER SKIN XFINE TIP W/RULER (MISCELLANEOUS) ×3 IMPLANT
NEEDLE FILTER BLUNT 18X 1/2SAF (NEEDLE) ×1
NEEDLE FILTER BLUNT 18X1 1/2 (NEEDLE) ×2 IMPLANT
NEEDLE HYPO 30X.5 LL (NEEDLE) ×6 IMPLANT
PACK DRAPE NASAL/ENT (PACKS) ×3 IMPLANT
SOL PREP PVP 2OZ (MISCELLANEOUS) ×3
SOLUTION PREP PVP 2OZ (MISCELLANEOUS) ×2 IMPLANT
SPONGE VERSALON 2X2 STRL (MISCELLANEOUS) ×10
SUT CHROMIC 4-0 (SUTURE)
SUT CHROMIC 4-0 M2 12X2 ARM (SUTURE)
SUT CHROMIC 5 0 P 3 (SUTURE) ×3 IMPLANT
SUT ETHILON 4 0 CL P 3 (SUTURE) IMPLANT
SUT MERSILENE 4-0 S-2 (SUTURE) IMPLANT
SUT PDS AB 4-0 P3 18 (SUTURE) IMPLANT
SUT PLAIN GUT (SUTURE) ×6 IMPLANT
SUT PROLENE 5 0 P 3 (SUTURE) ×6 IMPLANT
SUT PROLENE 6 0 P 1 18 (SUTURE) ×6 IMPLANT
SUT SILK 4 0 G 3 (SUTURE) IMPLANT
SUT VIC AB 5-0 P-3 18X BRD (SUTURE) IMPLANT
SUT VIC AB 5-0 P3 18 (SUTURE)
SUT VICRYL 6-0  S14 CTD (SUTURE)
SUT VICRYL 6-0 S14 CTD (SUTURE) IMPLANT
SUT VICRYL 7 0 TG140 8 (SUTURE) IMPLANT
SUTURE CHRMC 4-0 M2 12X2 ARM (SUTURE) IMPLANT
SYR 3ML LL SCALE MARK (SYRINGE) ×3 IMPLANT
SYRINGE 10CC LL (SYRINGE) ×3 IMPLANT
WATER STERILE IRR 500ML POUR (IV SOLUTION) ×3 IMPLANT

## 2016-05-03 NOTE — Interval H&P Note (Signed)
History and Physical Interval Note:  05/03/2016 8:57 AM  Crystal White  has presented today for surgery, with the diagnosis of DERMATOCHALASIS H02.831 RIGHT UPPER H02.834 LEFT UPPER BROW PTOSIS L90.8  The various methods of treatment have been discussed with the patient and family. After consideration of risks, benefits and other options for treatment, the patient has consented to  Procedure(s) with comments: BLEPHAROPLASTY (Bilateral) BROW PTOSIS (Bilateral) PTOSIS REPAIR (Bilateral) - CPAP as a surgical intervention .  The patient's history has been reviewed, patient examined, no change in status, stable for surgery.  I have reviewed the patient's chart and labs.  Questions were answered to the patient's satisfaction.     Vickki Muff, Aldean Pipe M

## 2016-05-03 NOTE — Transfer of Care (Signed)
Immediate Anesthesia Transfer of Care Note  Patient: Crystal White  Procedure(s) Performed: Procedure(s): BLEPHAROPLASTY  BILATERAL UPPER EYELIDS WITH EXCESS SKIN REMOVAL BILATERAL BROW PTOSIS REPAIR BLEPHAROPTOSIS REPAIR BILATERAL EYES  (Bilateral)  Patient Location: PACU  Anesthesia Type: MAC  Level of Consciousness: awake, alert  and patient cooperative  Airway and Oxygen Therapy: Patient Spontanous Breathing and Patient connected to supplemental oxygen  Post-op Assessment: Post-op Vital signs reviewed, Patient's Cardiovascular Status Stable, Respiratory Function Stable, Patent Airway and No signs of Nausea or vomiting  Post-op Vital Signs: Reviewed and stable  Complications: No apparent anesthesia complications

## 2016-05-03 NOTE — Op Note (Signed)
Preoperative Diagnosis:   1.  Visually significant bilateral brow ptosis.  2.  Visually significant blepharoptosis both Upper Eyelid(s) 3.  Visually significant dermatochalasis both Upper Eyelid(s)  Postoperative Diagnosis: Same.   Procedure(s) Performed:   1. Bilateral Direct brow lift to improve vision.  2.  Blepharoptosis repair with levator aponeurosis advancement bilateral Upper Eyelid(s) 3.  Upper eyelid blepharoplasty with excess skin excision  bilateral Upper Eyelid(s)  Teaching Surgeon: Philis Pique. Vickki Muff, M.D.   Assistants: None   Anesthesia: MAC  Specimens: None.  Estimated Blood Loss: Minimal.  Complications: None.  Operative Findings: None Dictated  PROCEDURE:   Allergies were reviewed and the patient is allergic to contrast media and statins..   After the risks, benefits, complications and alternatives were discussed with the patient, appropriate informed consent was obtained. While seated in an upright position and looking in primary gaze, the amount of supra-brow skin to be removed was measured and marked in an elliptical pattern. The patient was then brought to the operating suite and reclined supine.  Timeout was conducted and the patient was sedated. Local anesthetic consisting of a 50-50 mixture of 2% lidocaine with epinephrine and 0.75% bupivacaine with added Hylenex was injected subcutaneously to the bilateral brow region(s) and down to the periosteum and  subcutaneously to both upper eyelid(s). After adequate local was instilled, the patient was prepped and draped in the usual sterile fashion for eyelid surgery.   Attention was turned to the right brow region. A #15 blade was used to create a bevelled incision along the premarked incision line. A skin and subcutaneous tissue flap was then excised and hemostasis was obtained with bipolar cautery. The deep tissues were reapproximated with interrupted vertical 5-0 chromic sutures. The skin margin was reapproximated  with a running locking 5-0 Prolene suture. Attention was then turned to the opposite brow region where the same procedure was performed in the same manner.    Attention was then turned to the upper eyelids. A 9 mm upper eyelid crease incision line was marked with calipers on both upper eyelid(s).  A pinch test was used to estimate the amount of excess skin to remove and this was marked in standard blepharoplasty style fashion. Attention was turned to the right upper eyelid. A #15 blade was used to open the premarked incision line. A skin and partial muscle flap was excised and hemostasis was obtained with bipolar cautery.   A buttonhole was created medially in orbicularis and orbital septum to reveal the medial fat pocket. This was dissected free from fascial attachments, cauterized towards the pedicle base and excised to produce a nice flattening of the medial corner of the upper eyelid.  Westcott scissors were then used to transect through orbicularis for the length of the incision down to the tarsal plate. Epitarsus was dissected to create a smooth surface to suture to. Dissection was then carried superiorly in the plane between orbicularis and orbital septum. Once the preaponeurotic fat pocket was identified, the orbital septum was opened. This revealed the levator and its aponeurosis.    Attention was then turned to the opposite eyelid where the same procedure was performed in the same manner.   3 interrupted 6-0 Prolene sutures were then passed partial thickness through the tarsal plates of both upper eyelid(s). These sutures were placed in line with the mid pupillary, medial limbal, and lateral limbal lines. The sutures were fixed to the levator aponeurosis and adjusted until a nice lid height and contour were achieved. Once nice symmetry  was achieved, the skin incisions were closed with a combination of interrupted and  running 6-0 fast absorbing plain gut suture.   The patient tolerated the  procedure well. Erythromycin ophthalmic ointment was applied to the incision site(s) followed by ice packs.The patient was taken to the recovery area where she recovered without difficulty.  Post-Op Plan/Instructions:  The patient was instructed to use ice packs frequently for the next 48 hours. Marland Kitchenshe  was instructed to use bacitracin ophthalmic ointment on her incisions 4 times a day for the next 12 to 14 days. she was given a prescription for Percocet for pain control should Tylenol not be effective. she was asked to to follow up in 10 days time for suture removal or sooner as needed for problems.  Teaching Surgeon Attestation: None  Jahnasia Tatum M. Vickki Muff, M.D. Attending,Ophthalmology

## 2016-05-03 NOTE — Anesthesia Postprocedure Evaluation (Signed)
Anesthesia Post Note  Patient: SHETARA BERROA  Procedure(s) Performed: Procedure(s) (LRB): BLEPHAROPLASTY  BILATERAL UPPER EYELIDS WITH EXCESS SKIN REMOVAL BILATERAL BROW PTOSIS REPAIR BLEPHAROPTOSIS REPAIR BILATERAL EYES  (Bilateral)  Patient location during evaluation: PACU Anesthesia Type: MAC Level of consciousness: awake and alert Pain management: pain level controlled Vital Signs Assessment: post-procedure vital signs reviewed and stable Respiratory status: spontaneous breathing, nonlabored ventilation, respiratory function stable and patient connected to nasal cannula oxygen Cardiovascular status: blood pressure returned to baseline and stable Postop Assessment: no signs of nausea or vomiting Anesthetic complications: no    Alisa Graff

## 2016-05-03 NOTE — Anesthesia Procedure Notes (Signed)
Procedure Name: MAC Performed by: Cresencia Asmus Pre-anesthesia Checklist: Patient identified, Emergency Drugs available, Suction available, Patient being monitored and Timeout performed Patient Re-evaluated:Patient Re-evaluated prior to inductionOxygen Delivery Method: Nasal cannula       

## 2016-05-03 NOTE — H&P (Signed)
  See history and physical completed at [facility] on 04/27/2016 and scanned into the chart

## 2016-05-04 ENCOUNTER — Encounter: Payer: Self-pay | Admitting: Ophthalmology

## 2016-05-04 ENCOUNTER — Telehealth: Payer: Self-pay | Admitting: Family Medicine

## 2016-05-04 NOTE — Telephone Encounter (Signed)
Pt advised-aa 

## 2016-05-04 NOTE — Telephone Encounter (Signed)
Pt called saying she is having bronchitis symptoms.  She had eye surgery yesterday and cant come in today. She would like to know if you will call in an antibiotic.  She uses [facility]  In [place].  Her call back number is [phone]  Thank sTeri

## 2016-05-04 NOTE — Telephone Encounter (Signed)
Spoke with patient, symptoms are: no fever, coughing up yellowish/green phlegm, chest congestion, headache from surgery yesterday, no body aches, no sore throat, no drainage, no chills. She has taking Tylenol for her symptoms. Symptoms started yesterday, she had eye surgery and can not come in please review if we can help her. Thank you-aa

## 2016-05-04 NOTE — Telephone Encounter (Signed)
Fluids,robitussin.Will need evaluation for dyspnea or high fevers.

## 2016-05-17 IMAGING — CR DG ESOPHAGUS
5 series · 5 of 5 positions shown · non-contrast
Comparison: none

[t abdomen supine]
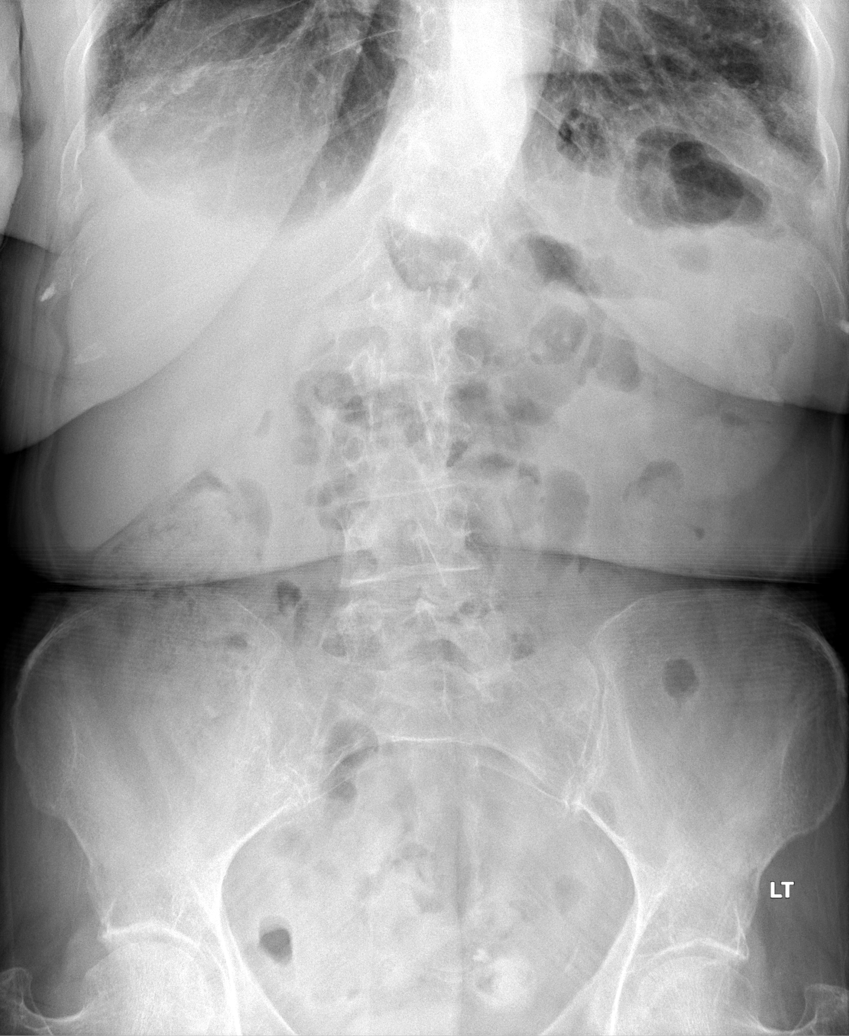

[t abdomen barium ap (1 of 4)]
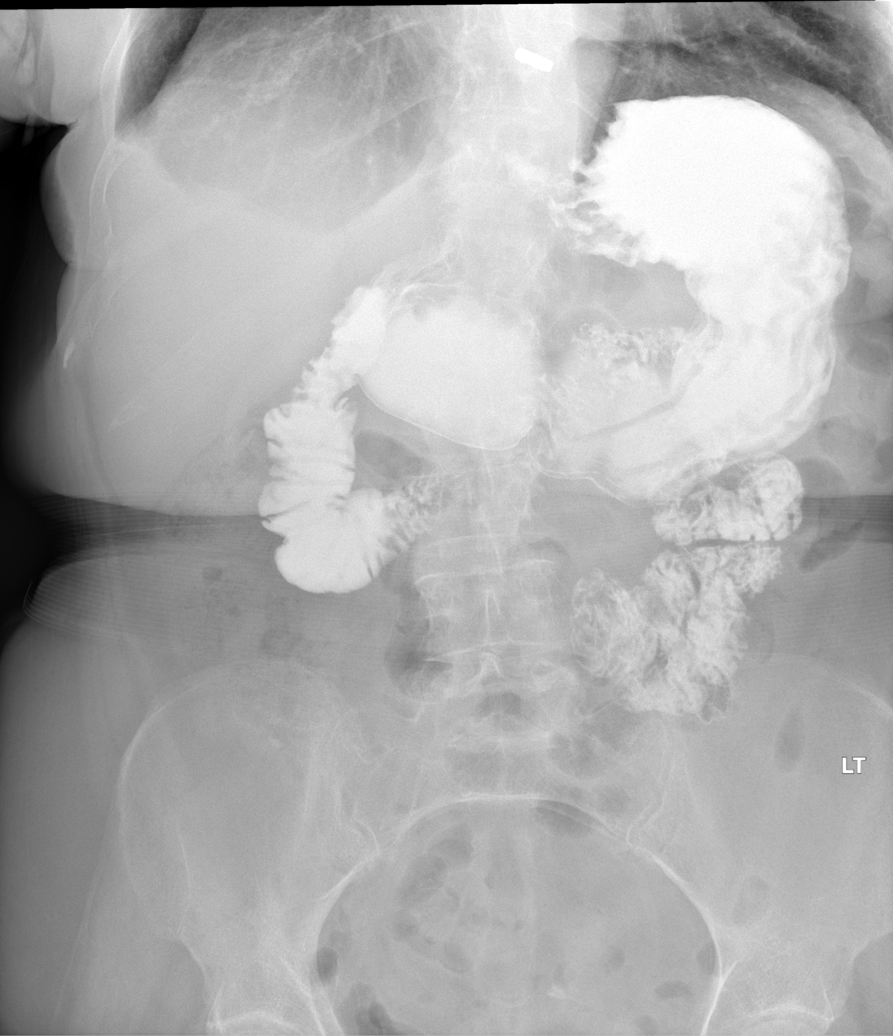

[t abdomen barium ap (2 of 4)]
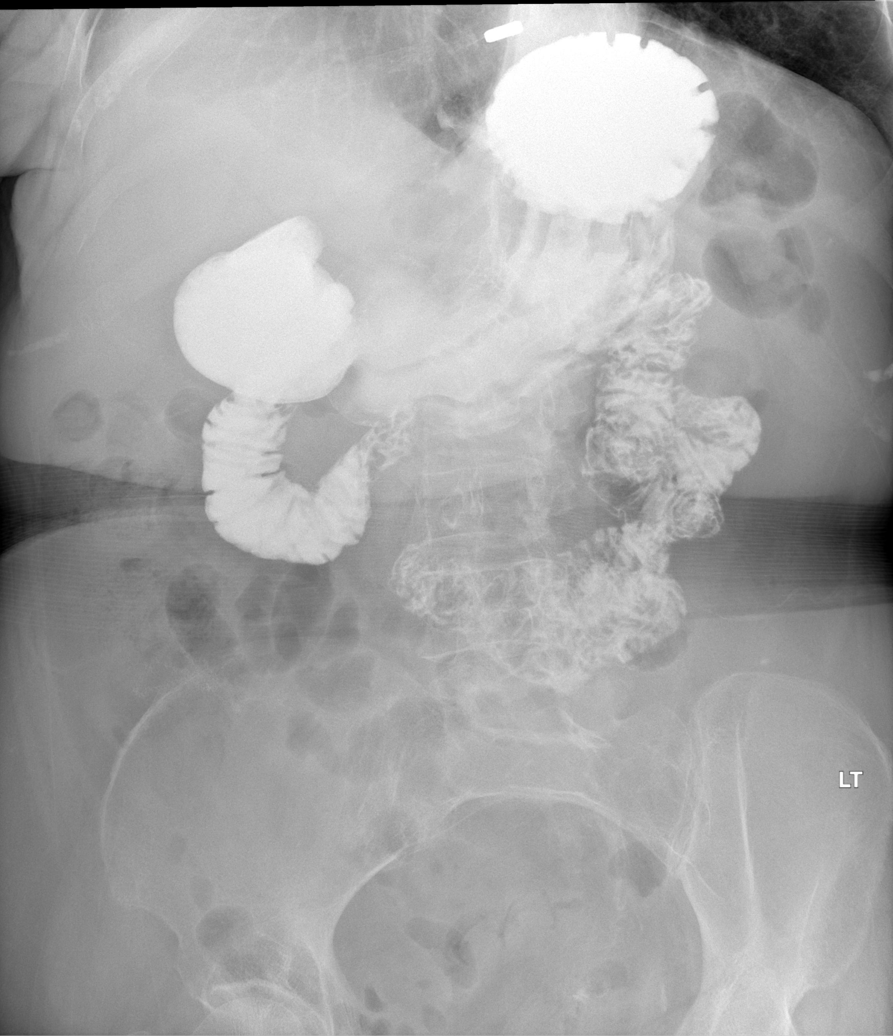

[t abdomen barium ap (3 of 4)]
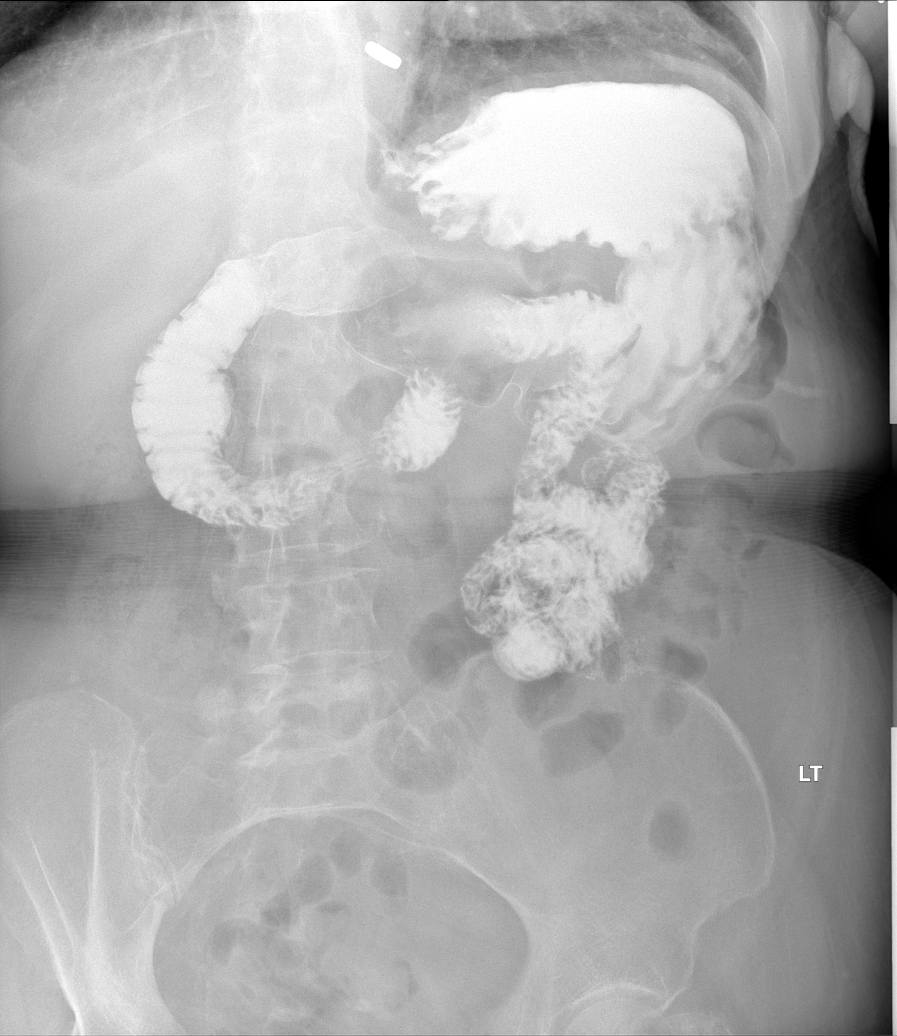

[t abdomen barium ap (4 of 4)]
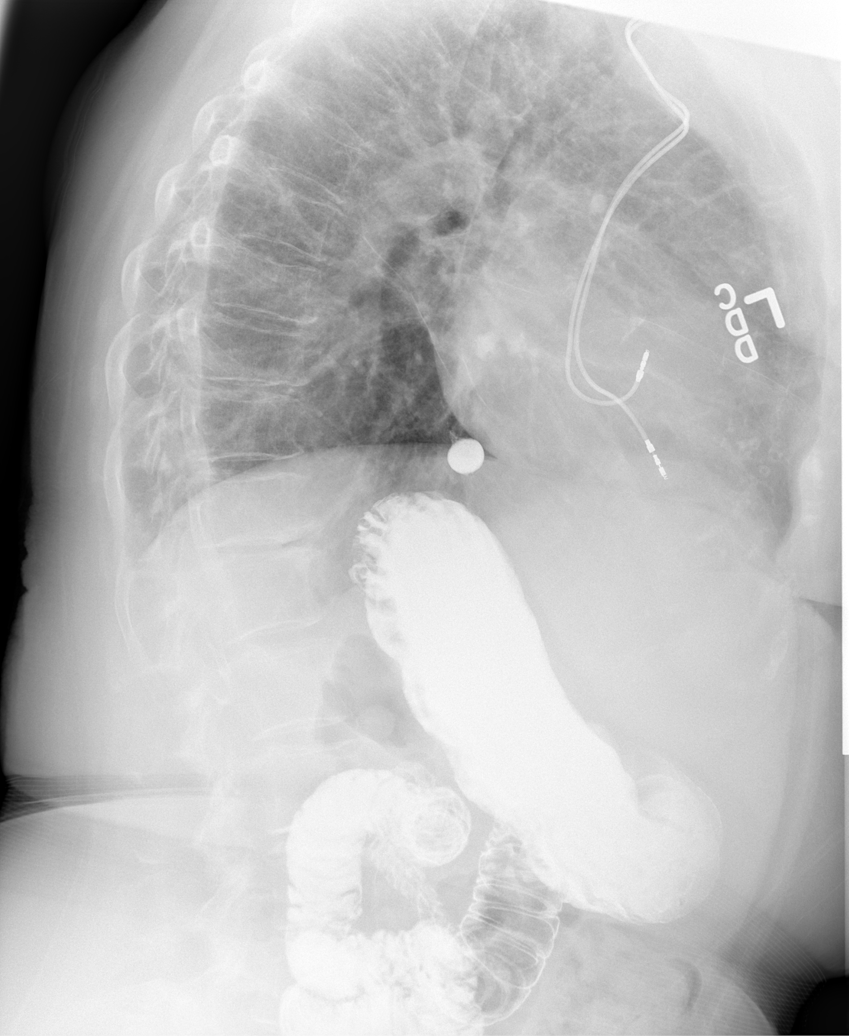

[5 of 5 positions shown; findings below may reference images not displayed]

:
Please select appropriate template.

Esophagram is included as part of the upper GI dictation.

## 2016-05-25 ENCOUNTER — Telehealth: Payer: Self-pay | Admitting: Internal Medicine

## 2016-05-25 NOTE — Telephone Encounter (Signed)
Pt received letter from [place] her she needs to qualify for her O2 for daytime use. I have scheduled pt for a SMW and OV appt for after. Nothing further needed.

## 2016-05-25 NOTE — Telephone Encounter (Signed)
Patient called and needs a new order placed for oxygen with [facility].

## 2016-05-26 ENCOUNTER — Emergency Department
Admission: EM | Admit: 2016-05-26 | Discharge: 2016-05-26 | Disposition: A | Discharge status: Home / Self Care | Payer: Medicare Other | Attending: Emergency Medicine | Admitting: Emergency Medicine

## 2016-05-26 ENCOUNTER — Ambulatory Visit (INDEPENDENT_AMBULATORY_CARE_PROVIDER_SITE_OTHER)
Admission: EM | Admit: 2016-05-26 | Discharge: 2016-05-26 | Disposition: A | Discharge status: Other Acute Inpatient Hospital | Payer: Medicare Other | Source: Home / Self Care | Attending: Family Medicine | Admitting: Family Medicine

## 2016-05-26 ENCOUNTER — Emergency Department: Payer: Medicare Other

## 2016-05-26 DIAGNOSIS — E785 Hyperlipidemia, unspecified: Secondary | ICD-10-CM | POA: Insufficient documentation

## 2016-05-26 DIAGNOSIS — F17210 Nicotine dependence, cigarettes, uncomplicated: Secondary | ICD-10-CM | POA: Insufficient documentation

## 2016-05-26 DIAGNOSIS — Z79899 Other long term (current) drug therapy: Secondary | ICD-10-CM | POA: Diagnosis not present

## 2016-05-26 DIAGNOSIS — G629 Polyneuropathy, unspecified: Secondary | ICD-10-CM

## 2016-05-26 DIAGNOSIS — Z950 Presence of cardiac pacemaker: Secondary | ICD-10-CM

## 2016-05-26 DIAGNOSIS — R197 Diarrhea, unspecified: Secondary | ICD-10-CM

## 2016-05-26 DIAGNOSIS — J449 Chronic obstructive pulmonary disease, unspecified: Secondary | ICD-10-CM | POA: Insufficient documentation

## 2016-05-26 DIAGNOSIS — K219 Gastro-esophageal reflux disease without esophagitis: Secondary | ICD-10-CM | POA: Insufficient documentation

## 2016-05-26 DIAGNOSIS — G4733 Obstructive sleep apnea (adult) (pediatric): Secondary | ICD-10-CM | POA: Insufficient documentation

## 2016-05-26 DIAGNOSIS — Z7951 Long term (current) use of inhaled steroids: Secondary | ICD-10-CM | POA: Diagnosis not present

## 2016-05-26 DIAGNOSIS — R112 Nausea with vomiting, unspecified: Secondary | ICD-10-CM

## 2016-05-26 DIAGNOSIS — M1990 Unspecified osteoarthritis, unspecified site: Secondary | ICD-10-CM | POA: Insufficient documentation

## 2016-05-26 DIAGNOSIS — I10 Essential (primary) hypertension: Secondary | ICD-10-CM | POA: Insufficient documentation

## 2016-05-26 DIAGNOSIS — R1084 Generalized abdominal pain: Secondary | ICD-10-CM

## 2016-05-26 DIAGNOSIS — K449 Diaphragmatic hernia without obstruction or gangrene: Secondary | ICD-10-CM | POA: Diagnosis not present

## 2016-05-26 DIAGNOSIS — K2270 Barrett's esophagus without dysplasia: Secondary | ICD-10-CM | POA: Insufficient documentation

## 2016-05-26 DIAGNOSIS — D72829 Elevated white blood cell count, unspecified: Secondary | ICD-10-CM

## 2016-05-26 DIAGNOSIS — R109 Unspecified abdominal pain: Secondary | ICD-10-CM | POA: Insufficient documentation

## 2016-05-26 LAB — CBC WITH DIFFERENTIAL/PLATELET
Basophils Absolute: 0.1 10*3/uL (ref 0–0.1)
Basophils Relative: 0 %
Eosinophils Absolute: 0 10*3/uL (ref 0–0.7)
Eosinophils Relative: 0 %
HCT: 50.5 % — ABNORMAL HIGH (ref 35.0–47.0)
Hemoglobin: 16.6 g/dL — ABNORMAL HIGH (ref 12.0–16.0)
Lymphocytes Relative: 4 %
Lymphs Abs: 1.1 10*3/uL (ref 1.0–3.6)
MCH: 28.6 pg (ref 26.0–34.0)
MCHC: 32.8 g/dL (ref 32.0–36.0)
MCV: 87.2 fL (ref 80.0–100.0)
Monocytes Absolute: 1.4 10*3/uL — ABNORMAL HIGH (ref 0.2–0.9)
Monocytes Relative: 6 %
Neutro Abs: 22.4 10*3/uL — ABNORMAL HIGH (ref 1.4–6.5)
Neutrophils Relative %: 90 %
Platelets: 255 10*3/uL (ref 150–440)
RBC: 5.79 MIL/uL — ABNORMAL HIGH (ref 3.80–5.20)
RDW: 15.1 % — ABNORMAL HIGH (ref 11.5–14.5)
WBC: 25 10*3/uL — ABNORMAL HIGH (ref 3.6–11.0)

## 2016-05-26 LAB — URINALYSIS COMPLETE WITH MICROSCOPIC (ARMC ONLY)
BACTERIA UA: NONE SEEN
BILIRUBIN URINE: NEGATIVE
GLUCOSE, UA: NEGATIVE mg/dL
HGB URINE DIPSTICK: NEGATIVE
Ketones, ur: NEGATIVE mg/dL
Leukocytes, UA: NEGATIVE
NITRITE: NEGATIVE
Protein, ur: 30 mg/dL — AB
RBC / HPF: NONE SEEN RBC/hpf (ref 0–5)
Specific Gravity, Urine: 1.02 (ref 1.005–1.030)
pH: 6 (ref 5.0–8.0)

## 2016-05-26 LAB — BASIC METABOLIC PANEL
Anion gap: 8 (ref 5–15)
BUN: 29 mg/dL — ABNORMAL HIGH (ref 6–20)
CO2: 33 mmol/L — ABNORMAL HIGH (ref 22–32)
Calcium: 8.8 mg/dL — ABNORMAL LOW (ref 8.9–10.3)
Chloride: 95 mmol/L — ABNORMAL LOW (ref 101–111)
Creatinine, Ser: 1.36 mg/dL — ABNORMAL HIGH (ref 0.44–1.00)
GFR calc Af Amer: 44 mL/min — ABNORMAL LOW (ref >60)
GFR calc non Af Amer: 38 mL/min — ABNORMAL LOW (ref >60)
Glucose, Bld: 130 mg/dL — ABNORMAL HIGH (ref 65–99)
Potassium: 3.5 mmol/L (ref 3.5–5.1)
Sodium: 136 mmol/L (ref 135–145)

## 2016-05-26 MED ORDER — ONDANSETRON HCL 4 MG/2ML IJ SOLN
4.0000 mg | Freq: Once | INTRAMUSCULAR | Status: AC
  Administered 2016-05-26: 4 mg via INTRAVENOUS

## 2016-05-26 MED ORDER — ONDANSETRON HCL 4 MG/2ML IJ SOLN
INTRAMUSCULAR | Status: AC
  Administered 2016-05-26: 4 mg via INTRAVENOUS
  Filled 2016-05-26: qty 2

## 2016-05-26 MED ORDER — MORPHINE SULFATE (PF) 2 MG/ML IV SOLN
INTRAVENOUS | Status: AC
  Administered 2016-05-26: 2 mg via INTRAVENOUS
  Filled 2016-05-26: qty 1

## 2016-05-26 MED ORDER — BARIUM SULFATE 2.1 % PO SUSP
450.0000 mL | ORAL | Status: AC
  Administered 2016-05-26: 450 mL via ORAL
  Filled 2016-05-26 (×2): qty 450

## 2016-05-26 MED ORDER — MORPHINE SULFATE (PF) 2 MG/ML IV SOLN
2.0000 mg | Freq: Once | INTRAVENOUS | Status: AC
  Administered 2016-05-26: 2 mg via INTRAVENOUS

## 2016-05-26 MED ORDER — SODIUM CHLORIDE 0.9 % IV BOLUS (SEPSIS)
500.0000 mL | Freq: Once | INTRAVENOUS | Status: AC
  Administered 2016-05-26: 500 mL via INTRAVENOUS

## 2016-05-26 NOTE — Progress Notes (Signed)
Outpatient Surgical Follow Up  05/26/2016  Crystal White is an 75 y.o. female.   Chief Complaint  Patient presents with  . Abdominal Pain  . Nausea  . Diarrhea    HPI: 75 year old female who is known to the surgery department report to the emergency room today for evaluation of abdominal pain, nausea, dry heaving, diarrhea. Patient states that the abdominal pain started yesterday and is in her lower abdomen. She does state that since being in the emergency room and receiving IV fluids she feels much better. Patient had a laparoscopic right hemicolectomy performed aproximally and one year ago by Dr. Rexene Edison. She denies any chest pain, shortness of breath, fevers, chills. She currently states she desires to go home.  Past Medical History  Diagnosis Date  . Hypertension   . COPD (chronic obstructive pulmonary disease) ([place])   . Hyperlipidemia   . GERD (gastroesophageal reflux disease)   . Stomach ulcer   . Barrett's esophagus   . Peripheral neuropathy ([place])   . Mobitz type II atrioventricular block   . Presence of permanent cardiac pacemaker 09/22/11    Biotronik - EVIADR-T [facility])  . Shortness of breath dyspnea   . Emphysema lung ([place])   . OSA (obstructive sleep apnea)     on CPAP  . Arthritis     hands, lower back    Past Surgical History  Procedure Laterality Date  . Total abdominal hysterectomy  1980  . Pacemaker insertion  09/22/11    [facility]  - Biotronik EVIADR-T  . Cardiac catheterization  2007  . Hiatal hernia repair  2007  . Colonoscopy with propofol N/A 04/14/2015    Procedure: COLONOSCOPY WITH PROPOFOL;  Surgeon: Lollie Sails, MD;  Location: [facility] ENDOSCOPY;  Service: Endoscopy;  Laterality: N/A;  . Esophagogastroduodenoscopy N/A 04/14/2015    Procedure: ESOPHAGOGASTRODUODENOSCOPY (EGD);  Surgeon: Lollie Sails, MD;  Location: [facility] ENDOSCOPY;  Service: Endoscopy;  Laterality: N/A;  . Appendectomy  2006    Dr. Pat Patrick  . Nissen fundoplication  1610  .  Laparoscopic right hemi colectomy Right 06/22/2015    Procedure: LAPAROSCOPIC RIGHT HEMI COLECTOMY;  Surgeon: Marlyce Huge, MD;  Location: [facility] ORS;  Service: General;  Laterality: Right;  . Colon surgery    . Brow lift Bilateral 05/03/2016    Procedure: BLEPHAROPLASTY  BILATERAL UPPER EYELIDS WITH EXCESS SKIN REMOVAL BILATERAL BROW PTOSIS REPAIR BLEPHAROPTOSIS REPAIR BILATERAL EYES ;  Surgeon: Karle Starch, MD;  Location: [place];  Service: Ophthalmology;  Laterality: Bilateral;    Family History  Problem Relation Age of Onset  . Adopted: Yes  . Family history unknown: Yes    Social History:  reports that she has been smoking Cigarettes.  She has a 27.5 pack-year smoking history. She has never used smokeless tobacco. She reports that she drinks alcohol. She reports that she does not use illicit drugs.  Allergies:  Allergies  Allergen Reactions  . Contrast Media [Iodinated Diagnostic Agents] Other (See Comments)    Reaction:  Burning and redness   . Statins Other (See Comments)    Reaction:  Leg cramps     Medications reviewed.    ROS Multipoint review of systems was completed. All pertinent positives and negatives are documented in the history of present illness and remainder are negative.   BP 149/55 mmHg  Pulse 85  Temp(Src) 97.7 F (36.5 C) (Oral)  Resp 18  Ht 5' 4"  (1.626 m)  Wt 77.565 kg (171 lb)  BMI 29.34  kg/m2  SpO2 95%  LMP  (Approximate)  Physical Exam Gen.: No acute distress Chest: Clear to auscultation Heart: Regular rhythm Abdomen: Soft, minimally tender to deep palpation in the midline, and nondistended.    Results for orders placed or performed during the hospital encounter of 05/26/16 (from the past 48 hour(s))  Urinalysis complete, with microscopic     Status: Abnormal   Collection Time: 05/26/16 10:37 AM  Result Value Ref Range   Color, Urine YELLOW YELLOW   APPearance CLEAR CLEAR   Glucose, UA NEGATIVE NEGATIVE mg/dL    Bilirubin Urine NEGATIVE NEGATIVE   Ketones, ur NEGATIVE NEGATIVE mg/dL   Specific Gravity, Urine 1.020 1.005 - 1.030   Hgb urine dipstick NEGATIVE NEGATIVE   pH 6.0 5.0 - 8.0   Protein, ur 30 (A) NEGATIVE mg/dL   Nitrite NEGATIVE NEGATIVE   Leukocytes, UA NEGATIVE NEGATIVE   RBC / HPF NONE SEEN 0 - 5 RBC/hpf   WBC, UA 0-5 0 - 5 WBC/hpf   Bacteria, UA NONE SEEN NONE SEEN   Squamous Epithelial / LPF 0-5 (A) NONE SEEN  Basic metabolic panel     Status: Abnormal   Collection Time: 05/26/16 11:36 AM  Result Value Ref Range   Sodium 136 135 - 145 mmol/L   Potassium 3.5 3.5 - 5.1 mmol/L   Chloride 95 (L) 101 - 111 mmol/L   CO2 33 (H) 22 - 32 mmol/L   Glucose, Bld 130 (H) 65 - 99 mg/dL   BUN 29 (H) 6 - 20 mg/dL   Creatinine, Ser 1.36 (H) 0.44 - 1.00 mg/dL   Calcium 8.8 (L) 8.9 - 10.3 mg/dL   GFR calc non Af Amer 38 (L) >60 mL/min   GFR calc Af Amer 44 (L) >60 mL/min    Comment: (NOTE) The eGFR has been calculated using the CKD EPI equation. This calculation has not been validated in all clinical situations. eGFR's persistently <60 mL/min signify possible Chronic Kidney Disease.    Anion gap 8 5 - 15  CBC with Differential     Status: Abnormal   Collection Time: 05/26/16 11:36 AM  Result Value Ref Range   WBC 25.0 (H) 3.6 - 11.0 K/uL   RBC 5.79 (H) 3.80 - 5.20 MIL/uL   Hemoglobin 16.6 (H) 12.0 - 16.0 g/dL   HCT 50.5 (H) 35.0 - 47.0 %   MCV 87.2 80.0 - 100.0 fL   MCH 28.6 26.0 - 34.0 pg   MCHC 32.8 32.0 - 36.0 g/dL   RDW 15.1 (H) 11.5 - 14.5 %   Platelets 255 150 - 440 K/uL   Neutrophils Relative % 90% %   Neutro Abs 22.4 (H) 1.4 - 6.5 K/uL   Lymphocytes Relative 4% %   Lymphs Abs 1.1 1.0 - 3.6 K/uL   Monocytes Relative 6% %   Monocytes Absolute 1.4 (H) 0.2 - 0.9 K/uL   Eosinophils Relative 0% %   Eosinophils Absolute 0.0 0 - 0.7 K/uL   Basophils Relative 0% %   Basophils Absolute 0.1 0 - 0.1 K/uL   Ct Abdomen Pelvis Wo Contrast  05/26/2016  CLINICAL DATA:  Abdominal  pain with diarrhea and nausea EXAM: CT ABDOMEN AND PELVIS WITHOUT CONTRAST TECHNIQUE: Multidetector CT imaging of the abdomen and pelvis was performed following the standard protocol without IV contrast. Oral contrast was administered. COMPARISON:  December 09, 2010 FINDINGS: Lower chest: There is mild scarring in the lung bases. There is a stable nodular opacity in the periphery of the  anterior segment of the left lower lobe measuring 5 x 4 mm. There is no lung base edema or consolidation. There is a fairly small hiatal hernia. Hepatobiliary: No focal liver lesions are identified on this noncontrast enhanced study. Gallbladder wall is not appreciably thickened. There is no biliary duct dilatation. Pancreas: There is no pancreatic mass or inflammatory focus. There is fatty infiltration in the head of the pancreas. Spleen: No splenic lesions are evident. Adrenals/Urinary Tract: Left adrenal appears normal. There is a stable right adrenal adenoma measuring 3.5 x 2.1 cm. There is a cyst arising from the posterior mid right kidney measuring 1.1 x 0.8 cm. There is no appreciable hydronephrosis on either side. There is a small focus of vascular calcification in a branch of the right renal artery. There is no intrarenal calculus on either side. There is no appreciable ureteral calculus on either side. The urinary bladder is midline with wall thickness within normal limits. There is increased attenuation and thickening along the course of the urethra, stable from prior study. Stomach/Bowel: There are occasional sigmoid diverticula without diverticulitis. The patient has had a portion of the right colon removed with the anastomosis patent. There is no bowel wall or mesenteric thickening. There is no appreciable bowel obstruction. No free air or portal venous air. Vascular/Lymphatic: There is atherosclerotic calcification throughout the aorta and common iliac arteries. Calcification is also noted in the external and internal  iliac arteries bilaterally. There is no abdominal aortic aneurysm. Major mesenteric vessels appear patent on this noncontrast enhanced study. There is no appreciable adenopathy in the abdomen or pelvis. Reproductive: Uterus is absent. There is no pelvic mass or pelvic fluid collection. Other: There is a focal ventral hernia which contains a loop of transverse colon but no bowel obstruction or compromise. Appendix absent. No abscess or ascites in the abdomen or pelvis. Musculoskeletal: There is degenerative change in the lower thoracic and lumbar spine regions. There is vacuum phenomenon at T11-12 and L2-3. There are no blastic or lytic bone lesions. There is no intramuscular or abdominal wall lesion. IMPRESSION: Ventral hernia containing a loop of transverse colon but no demonstrable bowel compromise. No bowel obstruction. No abscess. Patient has had a portion of the right colon appendix removed. Anastomosis patent in the right abdomen. Stable right adrenal adenoma. No renal or ureteral calculus.  No hydronephrosis. Increased attenuation and thickening along the course of the urethra, a stable finding. Question postoperative change/ possible collagen injections in this area. Uterus absent. Small hiatal hernia. Stable nodular opacity left base. Stability of this nodular opacity since 2012 is consistent with benign etiology. Electronically Signed   By: Lowella Grip III M.D.   On: 05/26/2016 17:27    Assessment/Plan:  Abdominal pain: 74 year old female that is one year status post right hemicolectomy with abdominal pain, nausea, vomiting and a markedly abnormal white blood cell count. All pertinent for admission for observation and repeat labs. She declined adamantly stating she would go home and will follow-up in clinic tomorrow. Discussed that she should report to the hospital for labs in the morning and be seen in our clinic tomorrow at approximately 10 AM. Discussed that she be walked in and seen as  available. Also discussed that should her symptoms return or worsen that she is to return to the emergency department. She voiced understanding and will follow-up in clinic tomorrow. I will Route this to my clinic nurse to ensure that she has repeat labs ordered and that she is seen in the appropriate  timeframe in the morning.    Clayburn Pert, MD FACS General Surgeon  05/26/2016,6:11 PM

## 2016-05-26 NOTE — ED Notes (Signed)
Bilateral lower abdominal pain X 2 days, dry heaves, diarrhea. Pt sent from [facility] for abnormal white count. Pt alert and oriented X4, active, cooperative, pt in NAD. RR even and unlabored, color WNL.

## 2016-05-26 NOTE — Discharge Instructions (Signed)

## 2016-05-26 NOTE — ED Provider Notes (Signed)
[facility] Emergency Department Provider Note   ____________________________________________  Time seen: Approximately 350 PM  I have reviewed the triage vital signs and the nursing notes.   HISTORY  Chief Complaint Abdominal Pain; Nausea; and Diarrhea   HPI Crystal White is a 75 y.o. female with a history of a bowel resection as well as a Nissen procedure who is presented to the emergency department with lower abdominal pain and abdominal distention over the past day. She said that she was having dry heaving yesterday as well as diarrhea. However, she has had neither since last night. She also says that she is not passing any gas today. Says that her abdominal pain is more lower.Denies any burning with urination. Was seen in urgent care earlier today and was found a white blood cell count of 25. She was then sent in to the emergency department for further workup.   Past Medical History  Diagnosis Date  . Hypertension   . COPD (chronic obstructive pulmonary disease) ([place])   . Hyperlipidemia   . GERD (gastroesophageal reflux disease)   . Stomach ulcer   . Barrett's esophagus   . Peripheral neuropathy ([place])   . Mobitz type II atrioventricular block   . Presence of permanent cardiac pacemaker 09/22/11    Biotronik - EVIADR-T [facility])  . Shortness of breath dyspnea   . Emphysema lung ([place])   . OSA (obstructive sleep apnea)     on CPAP  . Arthritis     hands, lower back    Patient Active Problem List   Diagnosis Date Noted  . Osteoporosis 02/22/2016  . Musculoskeletal chest pain 07/14/2015  . Chronic respiratory failure ([place]) 07/14/2015  . Acute bronchitis 07/14/2015  . Other emphysema ([place]) 07/14/2015  . Chronic renal insufficiency 07/14/2015  . Chest pain 07/13/2015  . Multiple pulmonary nodules 06/02/2015  . Colon polyp 05/29/2015  . Hemoptysis 04/29/2015  . Sprain of ankle 04/14/2015  . Anxiety 04/14/2015  . Barrett's esophagus  04/14/2015  . Acute exacerbation of chronic obstructive airways disease ([place]) 04/14/2015  . Bursitis 04/14/2015  . Cellulitis 04/14/2015  . Claudication ([place]) 04/14/2015  . Cough 04/14/2015  . Deficiency, 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  . Clinical depression 04/14/2015  . Dizziness 04/14/2015  . Dyslipidemia 04/14/2015  . Essential (primary) hypertension 04/14/2015  . Flank pain 04/14/2015  . H/O suicide attempt 04/14/2015  . Dysphonia 04/14/2015  . Hypercholesteremia 04/14/2015  . Cannot sleep 04/14/2015  . Malaise and fatigue 04/14/2015  . Mild cognitive impairment 04/14/2015  . Cramp in muscle 04/14/2015  . Muscle ache 04/14/2015  . Neuropathy ([place]) 04/14/2015  . Adiposity 04/14/2015  . 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 Candida infection of mouth 04/14/2015  . Abnormal loss of weight 04/14/2015  . Decreased body weight 04/14/2015  . Artificial cardiac pacemaker 10/08/2014  . Apnea, sleep 10/08/2014  . COPD (chronic obstructive pulmonary disease) ([place]) 02/13/2012  . Tobacco abuse 02/13/2012  . Sinus congestion 02/13/2012  . Current tobacco use 02/13/2012  . Esophagitis, reflux 08/28/2006  . Abdominal pain, right lower quadrant 08/22/2005  . Acute appendicitis with peritoneal abscess 08/22/2005    Past Surgical History  Procedure Laterality Date  . Total abdominal hysterectomy  1980  . Pacemaker insertion  09/22/11    [facility]  - Biotronik EVIADR-T  . Cardiac catheterization  2007  . Hiatal hernia repair  2007  . Colonoscopy with propofol N/A 04/14/2015    Procedure: COLONOSCOPY WITH PROPOFOL;  Surgeon: Lollie Sails, MD;  Location: [facility] ENDOSCOPY;  Service: Endoscopy;  Laterality: N/A;  . Esophagogastroduodenoscopy N/A 04/14/2015    Procedure: ESOPHAGOGASTRODUODENOSCOPY (EGD);  Surgeon: Lollie Sails, MD;  Location: [facility] ENDOSCOPY;  Service: Endoscopy;  Laterality: N/A;  . Appendectomy  2006    Dr. Pat Patrick  . Nissen fundoplication  [id number]  . Laparoscopic right hemi  colectomy Right 06/22/2015    Procedure: LAPAROSCOPIC RIGHT HEMI COLECTOMY;  Surgeon: Marlyce Huge, MD;  Location: [facility] ORS;  Service: General;  Laterality: Right;  . Colon surgery    . Brow lift Bilateral 05/03/2016    Procedure: BLEPHAROPLASTY  BILATERAL UPPER EYELIDS WITH EXCESS SKIN REMOVAL BILATERAL BROW PTOSIS REPAIR BLEPHAROPTOSIS REPAIR BILATERAL EYES ;  Surgeon: Karle Starch, MD;  Location: [place];  Service: Ophthalmology;  Laterality: Bilateral;    Current Outpatient Rx  Name  Route  Sig  Dispense  Refill  . acetaminophen (TYLENOL) 500 MG tablet   Oral   Take 1,000 mg by mouth every 4 (four) hours as needed for mild pain or headache.          . albuterol (PROVENTIL HFA) 108 (90 BASE) MCG/ACT inhaler   Inhalation   Inhale 2 puffs into the lungs every 6 (six) hours as needed for wheezing or shortness of breath.          . ALPRAZolam (XANAX) 0.25 MG tablet      TAKE 1 TABLET BY MOUTH TWICE DAILY Patient taking differently: TAKE 1 TABLET BY MOUTH TWICE DAILY (pt uses as needed)   60 tablet   2   . bacitracin ophthalmic ointment      Use on sutures 4 times a day for 12-14 days   3.5 g   3   . citalopram (CELEXA) 10 MG tablet   Oral   Take 1 tablet (10 mg total) by mouth at bedtime.   90 tablet   3   . fluticasone (FLONASE) 50 MCG/ACT nasal spray   Each Nare   Place 2 sprays into both nostrils daily as needed for rhinitis. Reported on 05/03/2016         . fluticasone furoate-vilanterol (BREO ELLIPTA) 100-25 MCG/INH AEPB   Inhalation   Inhale 1 puff into the lungs daily.   180 each   2   . gabapentin (NEURONTIN) 100 MG capsule      TAKE 2 CAPSULES BY MOUTH THREE TIMES DAILY   180 capsule   0   . guaiFENesin (MUCINEX) 600 MG 12 hr tablet   Oral   Take 1 tablet (600 mg total) by mouth 2 (two) times daily. Patient not taking: Reported on 05/03/2016   20 tablet   2   . hydrochlorothiazide (HYDRODIURIL) 25 MG tablet   Oral   Take 1  tablet (25 mg total) by mouth daily.   90 tablet   3   . hyoscyamine (LEVBID) 0.375 MG 12 hr tablet   Oral   Take 0.375 mg by mouth 2 (two) times daily as needed for cramping. Reported on 05/03/2016         . losartan (COZAAR) 50 MG tablet   Oral   Take 1 tablet (50 mg total) by mouth at bedtime.   90 tablet   3   . meclizine (ANTIVERT) 25 MG tablet   Oral   Take 1 tablet by mouth 2 (two) times daily as needed for dizziness.          . mirtazapine (REMERON) 30 MG tablet  TAKE 1 TABLET BY MOUTH AT BEDTIME   30 tablet   6   . oxyCODONE-acetaminophen (PERCOCET) 5-325 MG tablet   Oral   Take 1 tablet by mouth every 4 (four) hours as needed for severe pain.   6 tablet   0   . OXYGEN   Inhalation   Inhale into the lungs daily. Uses at night with CPAP and PRN during daytime         . pantoprazole (PROTONIX) 40 MG tablet   Oral   Take 1 tablet (40 mg total) by mouth 2 (two) times daily.   180 tablet   3   . simethicone (MYLICON) 80 MG chewable tablet   Oral   Chew 160 mg by mouth 2 (two) times daily as needed for flatulence.         . traZODone (DESYREL) 150 MG tablet      Take 1 tablet by mouth  every night at bedtime   90 tablet   3     Allergies Contrast media and Statins  Family History  Problem Relation Age of Onset  . Adopted: Yes  . Family history unknown: Yes    Social History Social History  Substance Use Topics  . Smoking status: Current Every Day Smoker -- 0.50 packs/day for 55 years    Types: Cigarettes  . Smokeless tobacco: Never Used  . Alcohol Use: 0.0 oz/week    0 Standard drinks or equivalent per week     Comment: occcasionall 1-2  glass wine a month    Review of Systems Constitutional: No fever/chills Eyes: No visual changes. ENT: No sore throat. Cardiovascular: Denies chest pain. Respiratory: Denies shortness of breath. Gastrointestinal:  No constipation. Genitourinary: Negative for dysuria. Musculoskeletal: Pain  radiating to the lower back. Skin: Negative for rash. Neurological: Negative for headaches, focal weakness or numbness.  10-point ROS otherwise negative.  ____________________________________________   PHYSICAL EXAM:  VITAL SIGNS: ED Triage Vitals  Enc Vitals Group     BP 05/26/16 1321 149/55 mmHg     Pulse Rate 05/26/16 1321 85     Resp 05/26/16 1321 18     Temp 05/26/16 1321 97.7 F (36.5 C)     Temp Source 05/26/16 1321 Oral     SpO2 05/26/16 1321 95 %     Weight 05/26/16 1321 171 lb (77.565 kg)     Height 05/26/16 1321 5\' 4"  (1.626 m)     Head Cir --      Peak Flow --      Pain Score 05/26/16 1322 9     Pain Loc --      Pain Edu? --      Excl. in [place]? --     Constitutional: Alert and oriented. Well appearing and in no acute distress. Eyes: Conjunctivae are normal. PERRL. EOMI. Head: Atraumatic. Nose: No congestion/rhinnorhea. Mouth/Throat: Mucous membranes are moist.   Neck: No stridor.   Cardiovascular: Normal rate, regular rhythm. Grossly normal heart sounds.   Respiratory: Normal respiratory effort.  No retractions. Lungs CTAB. Gastrointestinal: Soft With mild distention as well as lower abdominal tenderness. No CVA tenderness. Musculoskeletal: No lower extremity tenderness nor edema.  No joint effusions. Tenderness to the lower back. Neurologic:  Normal speech and language. No gross focal neurologic deficits are appreciated.  Skin:  Skin is warm, dry and intact. No rash noted. Psychiatric: Mood and affect are normal. Speech and behavior are normal.  ____________________________________________   LABS (all labs ordered are listed, but  only abnormal results are displayed)  Labs Reviewed - No data to display    _______ _____________________________________  EKG   ____________________________________________  RADIOLOGY   CT Abdomen Pelvis Wo Contrast (Final result) Result time: 05/26/16 17:27:49   Final result by Rad Results In Interface (05/26/16  17:27:49)   Narrative:   CLINICAL DATA: Abdominal pain with diarrhea and nausea  EXAM: CT ABDOMEN AND PELVIS WITHOUT CONTRAST  TECHNIQUE: Multidetector CT imaging of the abdomen and pelvis was performed following the standard protocol without IV contrast. Oral contrast was administered.  COMPARISON: December 09, 2010  FINDINGS: Lower chest: There is mild scarring in the lung bases. There is a stable nodular opacity in the periphery of the anterior segment of the left lower lobe measuring 5 x 4 mm. There is no lung base edema or consolidation. There is a fairly small hiatal hernia.  Hepatobiliary: No focal liver lesions are identified on this noncontrast enhanced study. Gallbladder wall is not appreciably thickened. There is no biliary duct dilatation.  Pancreas: There is no pancreatic mass or inflammatory focus. There is fatty infiltration in the head of the pancreas.  Spleen: No splenic lesions are evident.  Adrenals/Urinary Tract: Left adrenal appears normal. There is a stable right adrenal adenoma measuring 3.5 x 2.1 cm. There is a cyst arising from the posterior mid right kidney measuring 1.1 x 0.8 cm. There is no appreciable hydronephrosis on either side. There is a small focus of vascular calcification in a branch of the right renal artery. There is no intrarenal calculus on either side. There is no appreciable ureteral calculus on either side. The urinary bladder is midline with wall thickness within normal limits. There is increased attenuation and thickening along the course of the urethra, stable from prior study.  Stomach/Bowel: There are occasional sigmoid diverticula without diverticulitis. The patient has had a portion of the right colon removed with the anastomosis patent. There is no bowel wall or mesenteric thickening. There is no appreciable bowel obstruction. No free air or portal venous air.  Vascular/Lymphatic: There is atherosclerotic  calcification throughout the aorta and common iliac arteries. Calcification is also noted in the external and internal iliac arteries bilaterally. There is no abdominal aortic aneurysm. Major mesenteric vessels appear patent on this noncontrast enhanced study. There is no appreciable adenopathy in the abdomen or pelvis.  Reproductive: Uterus is absent. There is no pelvic mass or pelvic fluid collection.  Other: There is a focal ventral hernia which contains a loop of transverse colon but no bowel obstruction or compromise. Appendix absent. No abscess or ascites in the abdomen or pelvis.  Musculoskeletal: There is degenerative change in the lower thoracic and lumbar spine regions. There is vacuum phenomenon at T11-12 and L2-3. There are no blastic or lytic bone lesions. There is no intramuscular or abdominal wall lesion.  IMPRESSION: Ventral hernia containing a loop of transverse colon but no demonstrable bowel compromise.  No bowel obstruction. No abscess. Patient has had a portion of the right colon appendix removed. Anastomosis patent in the right abdomen.  Stable right adrenal adenoma.  No renal or ureteral calculus. No hydronephrosis.  Increased attenuation and thickening along the course of the urethra, a stable finding. Question postoperative change/ possible collagen injections in this area.  Uterus absent.  Small hiatal hernia. Stable nodular opacity left base. Stability of this nodular opacity since 2012 is consistent with benign etiology.   Electronically Signed By: Lowella Grip III M.D. On: 05/26/2016 17:27    ____________________________________________  PROCEDURES  ____________________________________________   INITIAL IMPRESSION / ASSESSMENT AND PLAN / ED COURSE  Pertinent labs & imaging results that were available during my care of the patient were reviewed by me and considered in my medical decision making (see chart for  details).  ----------------------------------------- 6:13 PM on 05/26/2016 -----------------------------------------  Patient says her pain is relieved after drinking the contrast and pain meds. However, given her high white blood cell count and distention on exam with tenderness initially I did call the surgeon. She was evaluated at bedside by Dr. Adonis Huguenin who did find that she did complete benign exam at this time. The patient has had no vomiting in the emergency department. She is wishing to go home. Per Dr. Adonis Huguenin the patient should come to his office at 10 AM tomorrow morning for reevaluation and repeat blood work. The patient understands these instructions as well as to return to the emergency department immediately for any worsening or concerning symptoms. The hernia was easily reducible. The patient is pain-free at this time. ____________________________________________   FINAL CLINICAL IMPRESSION(S) / ED DIAGNOSES  Abdominal pain with nausea vomiting and diarrhea.    NEW MEDICATIONS STARTED DURING THIS VISIT:  New Prescriptions   No medications on file     Note:  This document was prepared using Dragon voice recognition software and may include unintentional dictation errors.    Orbie Pyo, MD 05/26/16 [phone]

## 2016-05-26 NOTE — ED Provider Notes (Signed)
CSN: [id number]     Arrival date & time 05/26/16  [id number] History   First MD Initiated Contact with Patient 05/26/16 1057     Chief Complaint  Patient presents with  . Abdominal Pain   (Consider location/radiation/quality/duration/timing/severity/associated sxs/prior Treatment) HPI  This a 75 year old female who complains of abdominal pain low back pain nausea diarrhea and dry heaves that started yesterday afternoon. She states that she was feeling well then all of a sudden began to have symptoms starting with the low back pain and then beginning with the dry heaves shortly thereafter. He stopped around 10:30 PM. The diarrhea has been very watery but has no blood or mucus in it. She thought that she was going to die last night because of the stomach cramps. Her last bowel movement was last night as was the dry heaves. Today she  still has abdominal pain she thinks may be residuals from the amount of vomiting that she was doing. He's not had any vomiting or bowel movements since last night. She has a long involved history of multiple comorbidities Barrett's esophagitis, stomach ulcer ,GERD ,COPD. She is tolerating fluids.      Past Medical History  Diagnosis Date  . Hypertension   . COPD (chronic obstructive pulmonary disease) ([place])   . Hyperlipidemia   . GERD (gastroesophageal reflux disease)   . Stomach ulcer   . Barrett's esophagus   . Peripheral neuropathy ([place])   . Mobitz type II atrioventricular block   . Presence of permanent cardiac pacemaker 09/22/11    Biotronik - EVIADR-T [facility])  . Shortness of breath dyspnea   . Emphysema lung ([place])   . OSA (obstructive sleep apnea)     on CPAP  . Arthritis     hands, lower back   Past Surgical History  Procedure Laterality Date  . Total abdominal hysterectomy  1980  . Pacemaker insertion  09/22/11    [facility]  - Biotronik EVIADR-T  . Cardiac catheterization  2007  . Hiatal hernia repair  2007  . Colonoscopy with propofol N/A  04/14/2015    Procedure: COLONOSCOPY WITH PROPOFOL;  Surgeon: Lollie Sails, MD;  Location: [facility] ENDOSCOPY;  Service: Endoscopy;  Laterality: N/A;  . Esophagogastroduodenoscopy N/A 04/14/2015    Procedure: ESOPHAGOGASTRODUODENOSCOPY (EGD);  Surgeon: Lollie Sails, MD;  [facility] ENDOSCOPY;  Service: Endoscopy;  Laterality: N/A;  . Appendectomy  2006    Dr. Pat Patrick  . Nissen fundoplication  [id number]  . Laparoscopic right hemi colectomy Right 06/22/2015    Procedure: LAPAROSCOPIC RIGHT HEMI COLECTOMY;  Surgeon: Marlyce Huge, MD;  Location: [facility] ORS;  Service: General;  Laterality: Right;  . Colon surgery    . Brow lift Bilateral 05/03/2016    Procedure: BLEPHAROPLASTY  BILATERAL UPPER EYELIDS WITH EXCESS SKIN REMOVAL BILATERAL BROW PTOSIS REPAIR BLEPHAROPTOSIS REPAIR BILATERAL EYES ;  Surgeon: Karle Starch, MD;  Location: [place];  Service: Ophthalmology;  Laterality: Bilateral;   Family History  Problem Relation Age of Onset  . Adopted: Yes  . Family history unknown: Yes   Social History  Substance Use Topics  . Smoking status: Current Every Day Smoker -- 0.50 packs/day for 55 years    Types: Cigarettes  . Smokeless tobacco: Never Used  . Alcohol Use: 0.0 oz/week    0 Standard drinks or equivalent per week     Comment: occcasionall 1-2  glass wine a month   OB History    No data available     Review  of Systems  Constitutional: Positive for activity change and appetite change. Negative for fever, chills and fatigue.  Gastrointestinal: Positive for nausea, vomiting, abdominal pain and diarrhea. Negative for constipation and blood in stool.  All other systems reviewed and are negative.   Allergies  Contrast media and Statins  Home Medications   Prior to Admission medications   Medication Sig Start Date End Date Taking? Authorizing Provider  acetaminophen (TYLENOL) 500 MG tablet Take 1,000 mg by mouth every 4 (four) hours as needed for mild pain or  headache.    Yes Historical Provider, MD  albuterol (PROVENTIL HFA) 108 (90 BASE) MCG/ACT inhaler Inhale 2 puffs into the lungs every 6 (six) hours as needed for wheezing or shortness of breath.    Yes Historical Provider, MD  ALPRAZolam (XANAX) 0.25 MG tablet TAKE 1 TABLET BY MOUTH TWICE DAILY Patient taking differently: TAKE 1 TABLET BY MOUTH TWICE DAILY (pt uses as needed) 07/17/15  Yes Richard Maceo Pro., MD  citalopram (CELEXA) 10 MG tablet Take 1 tablet (10 mg total) by mouth at bedtime. 08/10/15  Yes Richard Maceo Pro., MD  fluticasone furoate-vilanterol (BREO ELLIPTA) 100-25 MCG/INH AEPB Inhale 1 puff into the lungs daily. 02/25/16  Yes Laverle Hobby, MD  gabapentin (NEURONTIN) 100 MG capsule TAKE 2 CAPSULES BY MOUTH THREE TIMES DAILY 04/20/16  Yes Jerrol Banana., MD  hydrochlorothiazide (HYDRODIURIL) 25 MG tablet Take 1 tablet (25 mg total) by mouth daily. 08/10/15  Yes Richard Maceo Pro., MD  hyoscyamine (LEVBID) 0.375 MG 12 hr tablet Take 0.375 mg by mouth 2 (two) times daily as needed for cramping. Reported on 05/03/2016   Yes Historical Provider, MD  losartan (COZAAR) 50 MG tablet Take 1 tablet (50 mg total) by mouth at bedtime. 08/10/15  Yes Richard Maceo Pro., MD  meclizine (ANTIVERT) 25 MG tablet Take 1 tablet by mouth 2 (two) times daily as needed for dizziness.  10/23/13  Yes Historical Provider, MD  mirtazapine (REMERON) 30 MG tablet TAKE 1 TABLET BY MOUTH AT BEDTIME 04/18/16  Yes Richard Maceo Pro., MD  oxyCODONE-acetaminophen (PERCOCET) 5-325 MG tablet Take 1 tablet by mouth every 4 (four) hours as needed for severe pain. 05/03/16  Yes Amy Dennie Maizes, MD  OXYGEN Inhale into the lungs daily. Uses at night with CPAP and PRN during daytime   Yes Historical Provider, MD  pantoprazole (PROTONIX) 40 MG tablet Take 1 tablet (40 mg total) by mouth 2 (two) times daily. 08/10/15  Yes Richard Maceo Pro., MD  traZODone (DESYREL) 150 MG tablet Take 1 tablet by mouth  every  night at bedtime 12/29/15  Yes Richard Maceo Pro., MD  bacitracin ophthalmic ointment Use on sutures 4 times a day for 12-14 days 05/03/16   Karle Starch, MD  fluticasone [facility]) 50 MCG/ACT nasal spray Place 2 sprays into both nostrils daily as needed for rhinitis. Reported on 05/03/2016    Historical Provider, MD  guaiFENesin (MUCINEX) 600 MG 12 hr tablet Take 1 tablet (600 mg total) by mouth 2 (two) times daily. Patient not taking: Reported on 05/03/2016 07/14/15   Theodoro Grist, MD  simethicone (MYLICON) 80 MG chewable tablet Chew 160 mg by mouth 2 (two) times daily as needed for flatulence.    Historical Provider, MD   Meds Ordered and Administered this Visit  Medications - No data to display  BP 141/71 mmHg  Pulse 77  Temp(Src) 97.9 F (36.6 C) (Tympanic)  Resp 16  Ht  5\' 4"  (1.626 m)  Wt 171 lb (77.565 kg)  BMI 29.34 kg/m2  SpO2 95%  LMP  (Approximate) No data found.   Physical Exam  Constitutional: She appears well-developed and well-nourished. No distress.  HENT:  Head: Normocephalic and atraumatic.  Right Ear: External ear normal.  Left Ear: External ear normal.  Eyes: Conjunctivae are normal. Pupils are equal, round, and reactive to light.  Neck: Normal range of motion. Neck supple.  Pulmonary/Chest: Effort normal and breath sounds normal. No respiratory distress. She has no wheezes. She has no rales.  Abdominal: Soft. Bowel sounds are normal. She exhibits no distension and no mass. There is tenderness. There is no rebound and no guarding.  Patient has diffuse tenderness of the abdomen but no distention seen normal percussive note no rebound and no guarding. No masses are palpable. She does not look toxic. Exam was performed in the presence of Heather, RN acting as Education officer, museum  Skin: She is not diaphoretic.  Nursing note and vitals reviewed.   ED Course  Procedures (including critical care time)  Labs Review Labs Reviewed  URINALYSIS COMPLETEWITH  MICROSCOPIC ([facility] ONLY) - Abnormal; Notable for the following:    Protein, ur 30 (*)    Squamous Epithelial / LPF 0-5 (*)    All other components within normal limits  BASIC METABOLIC PANEL - Abnormal; Notable for the following:    Chloride 95 (*)    CO2 33 (*)    Glucose, Bld 130 (*)    BUN 29 (*)    Creatinine, Ser 1.36 (*)    Calcium 8.8 (*)    GFR calc non Af Amer 38 (*)    GFR calc Af Amer 44 (*)    All other components within normal limits  CBC WITH DIFFERENTIAL/PLATELET - Abnormal; Notable for the following:    WBC 25.0 (*)    RBC 5.79 (*)    Hemoglobin 16.6 (*)    HCT 50.5 (*)    RDW 15.1 (*)    Neutro Abs 22.4 (*)    Monocytes Absolute 1.4 (*)    All other components within normal limits  CBG MONITORING, ED    Imaging Review No results found.   Visual Acuity Review  Right Eye Distance:   Left Eye Distance:   Bilateral Distance:    Right Eye Near:   Left Eye Near:    Bilateral Near:         MDM   1. Nausea vomiting and diarrhea   2. Generalized abdominal pain   3. Leukocytosis    Discussed with the patient our findings and recommended that she be transferred to [facility] for further workup and care because of her leukocytosis and continued abdominal pain. In addition she has a renal insufficiency and will require rehydration. She will be transported by her son on a private vehicle and is stable at the time that she leaves the facility. I've contacted [facility] and spoken with the charge nurse Marya Amsler given him report.    Lorin Picket, PA-C 05/26/16 1317  Lorin Picket, PA-C 05/26/16 1320

## 2016-05-26 NOTE — ED Notes (Signed)
Patient complains of abdominal pain, low back pain, nausea and dry heaves. Patient states that she is still having body aches. Patient states that symptoms started yesterday and have been worsening.

## 2016-05-27 ENCOUNTER — Ambulatory Visit (INDEPENDENT_AMBULATORY_CARE_PROVIDER_SITE_OTHER): Payer: Medicare Other | Admitting: General Surgery

## 2016-05-27 ENCOUNTER — Other Ambulatory Visit: Payer: Self-pay | Admitting: General Surgery

## 2016-05-27 ENCOUNTER — Encounter: Payer: Self-pay | Admitting: General Surgery

## 2016-05-27 ENCOUNTER — Other Ambulatory Visit
Admission: RE | Admit: 2016-05-27 | Discharge: 2016-05-27 | Disposition: A | Discharge status: Home / Self Care | Payer: Medicare Other | Source: Ambulatory Visit | Attending: General Surgery | Admitting: General Surgery

## 2016-05-27 VITALS — BP 125/84 | HR 84 | Temp 98.3°F | Ht 64.0 in | Wt 169.0 lb

## 2016-05-27 DIAGNOSIS — R1084 Generalized abdominal pain: Secondary | ICD-10-CM

## 2016-05-27 LAB — BASIC METABOLIC PANEL WITH GFR
Anion gap: 13 (ref 5–15)
BUN: 20 mg/dL (ref 6–20)
CO2: 26 mmol/L (ref 22–32)
Calcium: 8.4 mg/dL — ABNORMAL LOW (ref 8.9–10.3)
Chloride: 96 mmol/L — ABNORMAL LOW (ref 101–111)
Creatinine, Ser: 1.38 mg/dL — ABNORMAL HIGH (ref 0.44–1.00)
GFR calc Af Amer: 43 mL/min — ABNORMAL LOW
GFR calc non Af Amer: 37 mL/min — ABNORMAL LOW
Glucose, Bld: 117 mg/dL — ABNORMAL HIGH (ref 65–99)
Potassium: 3.6 mmol/L (ref 3.5–5.1)
Sodium: 135 mmol/L (ref 135–145)

## 2016-05-27 LAB — CBC
HCT: 44.8 % (ref 35.0–47.0)
Hemoglobin: 14.7 g/dL (ref 12.0–16.0)
MCH: 28.4 pg (ref 26.0–34.0)
MCHC: 32.9 g/dL (ref 32.0–36.0)
MCV: 86.3 fL (ref 80.0–100.0)
Platelets: 205 K/uL (ref 150–440)
RBC: 5.19 MIL/uL (ref 3.80–5.20)
RDW: 15.3 % — ABNORMAL HIGH (ref 11.5–14.5)
WBC: 26.3 K/uL — ABNORMAL HIGH (ref 3.6–11.0)

## 2016-05-27 MED ORDER — SULFAMETHOXAZOLE-TRIMETHOPRIM 800-160 MG PO TABS: 1.0000 | ORAL_TABLET | Freq: Two times a day (BID) | ORAL | Status: DC

## 2016-05-27 NOTE — Progress Notes (Signed)
Outpatient Surgical Follow Up  05/27/2016  Crystal White is an 75 y.o. female.   Chief Complaint  Patient presents with  . Follow-up    HPI: 75 year old female returns to the clinic for follow-up from recent ER visit. Patient reports that she has had intermittent abdominal pain, nausea, vomiting over the last several days. Her last vomiting was now 2 days ago. Her last bowel movement was also 2 days ago and was diarrhea. She denies any other symptoms. She denies any fevers, chills, chest pain, shortness of breath, constipation. She does have a chronic cough secondary to her emphysema. She states that she still actually feels better than she did before she went to the emergency department but still doesn't "feel back to myself ". She states a very strong desire not to come in to the hospital.  Past Medical History  Diagnosis Date  . Hypertension   . COPD (chronic obstructive pulmonary disease) ([place])   . Hyperlipidemia   . GERD (gastroesophageal reflux disease)   . Stomach ulcer   . Barrett's esophagus   . Peripheral neuropathy ([place])   . Mobitz type II atrioventricular block   . Presence of permanent cardiac pacemaker 09/22/11    Biotronik - EVIADR-T [facility])  . Shortness of breath dyspnea   . Emphysema lung ([place])   . OSA (obstructive sleep apnea)     on CPAP  . Arthritis     hands, lower back    Past Surgical History  Procedure Laterality Date  . Total abdominal hysterectomy  1980  . Pacemaker insertion  09/22/11    [facility]  - Biotronik EVIADR-T  . Cardiac catheterization  2007  . Hiatal hernia repair  2007  . Colonoscopy with propofol N/A 04/14/2015    Procedure: COLONOSCOPY WITH PROPOFOL;  Surgeon: Lollie Sails, MD;  Location[facility] ENDOSCOPY;  Service: Endoscopy;  Laterality: N/A;  . Esophagogastroduodenoscopy N/A 04/14/2015    Procedure: ESOPHAGOGASTRODUODENOSCOPY (EGD);  Surgeon: Lollie Sails, MD;  Location: [facility] ENDOSCOPY;  Service: Endoscopy;  Laterality: N/A;   . Appendectomy  2006    Dr. Pat Patrick  . Nissen fundoplication  [id number]  . Laparoscopic right hemi colectomy Right 06/22/2015    Procedure: LAPAROSCOPIC RIGHT HEMI COLECTOMY;  Surgeon: Marlyce Huge, MD;  Location: [facility] ORS;  Service: General;  Laterality: Right;  . Colon surgery    . Brow lift Bilateral 05/03/2016    Procedure: BLEPHAROPLASTY  BILATERAL UPPER EYELIDS WITH EXCESS SKIN REMOVAL BILATERAL BROW PTOSIS REPAIR BLEPHAROPTOSIS REPAIR BILATERAL EYES ;  Surgeon: Karle Starch, MD;  Location: [place];  Service: Ophthalmology;  Laterality: Bilateral;    Family History  Problem Relation Age of Onset  . Adopted: Yes  . Family history unknown: Yes    Social History:  reports that she has been smoking Cigarettes.  She has a 27.5 pack-year smoking history. She has never used smokeless tobacco. She reports that she drinks alcohol. She reports that she does not use illicit drugs.  Allergies:  Allergies  Allergen Reactions  . Contrast Media [Iodinated Diagnostic Agents] Other (See Comments) and Rash    Pain and burning Reaction:  Burning and redness   . Statins Other (See Comments)    Reaction:  Leg cramps     Medications reviewed.    ROS Multipoint review of systems was performed, all pertinent positives and negatives are documented within the history of present illness remainder negative.   BP 125/84 mmHg  Pulse 84  Temp(Src) 98.3 F (36.8  C) (Oral)  Ht 5' 4"  (1.626 m)  Wt 76.658 kg (169 lb)  BMI 28.99 kg/m2  LMP  (Approximate)  Physical Exam Gen.: No acute distress Chest: Clear to auscultation Heart: Regular rate and rhythm Abdomen: Soft, minimally tender to palpation in the midline, palpable ventral hernia that is easily reducible but tender. No evidence of peritonitis or distention. Extremities: Moves all extremities well without evidence of edema    Results for orders placed or performed during the hospital encounter of 05/27/16 (from the past 48  hour(s))  CBC     Status: Abnormal   Collection Time: 05/27/16 10:45 AM  Result Value Ref Range   WBC 26.3 (H) 3.6 - 11.0 K/uL   RBC 5.19 3.80 - 5.20 MIL/uL   Hemoglobin 14.7 12.0 - 16.0 g/dL   HCT 44.8 35.0 - 47.0 %   MCV 86.3 80.0 - 100.0 fL   MCH 28.4 26.0 - 34.0 pg   MCHC 32.9 32.0 - 36.0 g/dL   RDW 15.3 (H) 11.5 - 14.5 %   Platelets 205 150 - 440 K/uL  Basic metabolic panel     Status: Abnormal   Collection Time: 05/27/16 10:45 AM  Result Value Ref Range   Sodium 135 135 - 145 mmol/L   Potassium 3.6 3.5 - 5.1 mmol/L   Chloride 96 (L) 101 - 111 mmol/L   CO2 26 22 - 32 mmol/L   Glucose, Bld 117 (H) 65 - 99 mg/dL   BUN 20 6 - 20 mg/dL   Creatinine, Ser 1.38 (H) 0.44 - 1.00 mg/dL   Calcium 8.4 (L) 8.9 - 10.3 mg/dL   GFR calc non Af Amer 37 (L) >60 mL/min   GFR calc Af Amer 43 (L) >60 mL/min    Comment: (NOTE) The eGFR has been calculated using the CKD EPI equation. This calculation has not been validated in all clinical situations. eGFR's persistently <60 mL/min signify possible Chronic Kidney Disease.    Anion gap 13 5 - 15   Ct Abdomen Pelvis Wo Contrast  05/26/2016  CLINICAL DATA:  Abdominal pain with diarrhea and nausea EXAM: CT ABDOMEN AND PELVIS WITHOUT CONTRAST TECHNIQUE: Multidetector CT imaging of the abdomen and pelvis was performed following the standard protocol without IV contrast. Oral contrast was administered. COMPARISON:  December 09, 2010 FINDINGS: Lower chest: There is mild scarring in the lung bases. There is a stable nodular opacity in the periphery of the anterior segment of the left lower lobe measuring 5 x 4 mm. There is no lung base edema or consolidation. There is a fairly small hiatal hernia. Hepatobiliary: No focal liver lesions are identified on this noncontrast enhanced study. Gallbladder wall is not appreciably thickened. There is no biliary duct dilatation. Pancreas: There is no pancreatic mass or inflammatory focus. There is fatty infiltration in  the head of the pancreas. Spleen: No splenic lesions are evident. Adrenals/Urinary Tract: Left adrenal appears normal. There is a stable right adrenal adenoma measuring 3.5 x 2.1 cm. There is a cyst arising from the posterior mid right kidney measuring 1.1 x 0.8 cm. There is no appreciable hydronephrosis on either side. There is a small focus of vascular calcification in a branch of the right renal artery. There is no intrarenal calculus on either side. There is no appreciable ureteral calculus on either side. The urinary bladder is midline with wall thickness within normal limits. There is increased attenuation and thickening along the course of the urethra, stable from prior study. Stomach/Bowel: There are occasional  sigmoid diverticula without diverticulitis. The patient has had a portion of the right colon removed with the anastomosis patent. There is no bowel wall or mesenteric thickening. There is no appreciable bowel obstruction. No free air or portal venous air. Vascular/Lymphatic: There is atherosclerotic calcification throughout the aorta and common iliac arteries. Calcification is also noted in the external and internal iliac arteries bilaterally. There is no abdominal aortic aneurysm. Major mesenteric vessels appear patent on this noncontrast enhanced study. There is no appreciable adenopathy in the abdomen or pelvis. Reproductive: Uterus is absent. There is no pelvic mass or pelvic fluid collection. Other: There is a focal ventral hernia which contains a loop of transverse colon but no bowel obstruction or compromise. Appendix absent. No abscess or ascites in the abdomen or pelvis. Musculoskeletal: There is degenerative change in the lower thoracic and lumbar spine regions. There is vacuum phenomenon at T11-12 and L2-3. There are no blastic or lytic bone lesions. There is no intramuscular or abdominal wall lesion. IMPRESSION: Ventral hernia containing a loop of transverse colon but no demonstrable bowel  compromise. No bowel obstruction. No abscess. Patient has had a portion of the right colon appendix removed. Anastomosis patent in the right abdomen. Stable right adrenal adenoma. No renal or ureteral calculus.  No hydronephrosis. Increased attenuation and thickening along the course of the urethra, a stable finding. Question postoperative change/ possible collagen injections in this area. Uterus absent. Small hiatal hernia. Stable nodular opacity left base. Stability of this nodular opacity since 2012 is consistent with benign etiology. Electronically Signed   By: Lowella Grip III M.D.   On: 05/26/2016 17:27    Assessment/Plan:  1. Generalized abdominal pain 75 year old female with a generalized abdominal pain and a remote history now nausea and vomiting. She has some vague other complaints including her chronic cough and some diarrhea 2 days ago. Given her elevated white blood cell count without a clear cause I again discussed the option of admission. She voiced a strong desire not to come in to the hospital. At this point we will start her on antibiotics for a presumed enteritis. Discussed with her at length that should any of her symptoms worsen in the slightest bit that she is to return to the emergency department immediately. She voiced understanding. She'll follow-up in clinic in 2 weeks to discuss hernia repair as long as she improves.     Clayburn Pert, MD FACS General Surgeon  05/27/2016,12:14 PM

## 2016-05-27 NOTE — Patient Instructions (Addendum)
If you still have Abdominal Pain, fever, vomiting, not keeping food down, I will want you to go to the Emergency Room.  You could eat the "BRAT Diet". Banana, Rice, Apple Sauce and Toast.  Please take Tylenol if you have abdominal pain. If you continue to have abdominal pain, please give [place] a call.

## 2016-05-30 ENCOUNTER — Other Ambulatory Visit: Payer: Self-pay

## 2016-06-01 DIAGNOSIS — R109 Unspecified abdominal pain: Secondary | ICD-10-CM | POA: Diagnosis not present

## 2016-06-01 DIAGNOSIS — K529 Noninfective gastroenteritis and colitis, unspecified: Secondary | ICD-10-CM | POA: Diagnosis not present

## 2016-06-03 ENCOUNTER — Ambulatory Visit (INDEPENDENT_AMBULATORY_CARE_PROVIDER_SITE_OTHER): Payer: Medicare Other | Admitting: General Surgery

## 2016-06-03 ENCOUNTER — Encounter: Payer: Self-pay | Admitting: General Surgery

## 2016-06-03 VITALS — BP 148/78 | HR 153 | Temp 97.7°F | Ht 64.0 in | Wt 175.0 lb

## 2016-06-03 DIAGNOSIS — K432 Incisional hernia without obstruction or gangrene: Secondary | ICD-10-CM | POA: Insufficient documentation

## 2016-06-03 NOTE — Patient Instructions (Signed)
Please call our office if you have any questions or concerns.  

## 2016-06-03 NOTE — Progress Notes (Signed)
Outpatient Surgical Follow Up  06/03/2016  Crystal White is an 75 y.o. female.   Chief Complaint  Patient presents with  . Follow-up    General Abdominal Pian    HPI: Patient returns to clinic for follow-up from recent evaluation with an elevated white blood cell count and a new diagnosis of an incisional hernia. Patient reports that since being started on antibiotics last week she has felt worlds better. She denies any fevers, chills, nausea, vomiting, chest pain, shortness of breath, diarrhea, constipation. Her intermittent pains around her hernia site have decreased and are currently not bothering her at all. She states she's not really interested in any surgery at this point for any reason.  Past Medical History  Diagnosis Date  . Hypertension   . COPD (chronic obstructive pulmonary disease) ([place])   . Hyperlipidemia   . GERD (gastroesophageal reflux disease)   . Stomach ulcer   . Barrett's esophagus   . Peripheral neuropathy ([place])   . Mobitz type II atrioventricular block   . Presence of permanent cardiac pacemaker 09/22/11    Biotronik - EVIADR-T [facility])  . Shortness of breath dyspnea   . Emphysema lung ([place])   . OSA (obstructive sleep apnea)     on CPAP  . Arthritis     hands, lower back    Past Surgical History  Procedure Laterality Date  . Total abdominal hysterectomy  1980  . Pacemaker insertion  09/22/11    [facility]  - Biotronik EVIADR-T  . Cardiac catheterization  2007  . Hiatal hernia repair  2007  . Colonoscopy with propofol N/A 04/14/2015    Procedure: COLONOSCOPY WITH PROPOFOL;  Surgeon: Lollie Sails, MD;  Location: [facility] ENDOSCOPY;  Service: Endoscopy;  Laterality: N/A;  . Esophagogastroduodenoscopy N/A 04/14/2015    Procedure: ESOPHAGOGASTRODUODENOSCOPY (EGD);  Surgeon: Lollie Sails, MD;  [facility] END[facility]COPY;  Service: Endoscopy;  Laterality: N/A;  . Appendectomy  2006    Dr. Pat Patrick  . Nissen fundoplication  [id number]  . Laparoscopic right hemi  colectomy Right 06/22/2015    Procedure: LAPAROSCOPIC RIGHT HEMI COLECTOMY;  Surgeon: Marlyce Huge, MD;  Location: [facility] ORS;  Service: General;  Laterality: Right;  . Colon surgery    . Brow lift Bilateral 05/03/2016    Procedure: BLEPHAROPLASTY  BILATERAL UPPER EYELIDS WITH EXCESS SKIN REMOVAL BILATERAL BROW PTOSIS REPAIR BLEPHAROPTOSIS REPAIR BILATERAL EYES ;  Surgeon: Karle Starch, MD;  Location: [place];  Service: Ophthalmology;  Laterality: Bilateral;    Family History  Problem Relation Age of Onset  . Adopted: Yes  . Family history unknown: Yes    Social History:  reports that she has been smoking Cigarettes.  She has a 27.5 pack-year smoking history. She has never used smokeless tobacco. She reports that she drinks alcohol. She reports that she does not use illicit drugs.  Allergies:  Allergies  Allergen Reactions  . Contrast Media [Iodinated Diagnostic Agents] Other (See Comments) and Rash    Pain and burning Reaction:  Burning and redness   . Other Rash    Pain and burning  . Statins Other (See Comments)    Reaction:  Leg cramps     Medications reviewed.    ROS A multipoint review of systems was completed, all pertinent positives and negatives are documented within the history of present illness remainder negative.   BP 148/78 mmHg  Pulse 153  Temp(Src) 97.7 F (36.5 C) (Oral)  Ht 5\' 4"  (1.626 m)  Wt 79.379  kg (175 lb)  BMI 30.02 kg/m2  LMP  (Approximate)  Physical Exam Gen.: No acute distress Chest: Clear to auscultation Heart: Regular rate and rhythm Abdomen: Soft, nontender, nondistended. Easily reducible incisional hernia just above the umbilicus.    No results found for this or any previous visit (from the past 48 hour(s)). No results found.  Assessment/Plan:  1. Incisional hernia, without obstruction or gangrene 75 year old female with recent bacterial infection that has responded well to oral antibiotics and a diagnosis of  incisional hernia that is currently asymptomatic. Discussed at length the signs and symptoms of worsening hernia to include incarceration and strength relation. Should this signs occur she is to report to emergency room immediately. Otherwise discussed that if it only becomes more discomforting she can return to clinic for evaluation of elective repair. She'll follow-up in clinic on an as-needed basis.     Clayburn Pert, MD FACS General Surgeon  06/03/2016,4:18 PM

## 2016-06-04 ENCOUNTER — Other Ambulatory Visit: Payer: Self-pay | Admitting: Family Medicine

## 2016-06-10 ENCOUNTER — Ambulatory Visit (INDEPENDENT_AMBULATORY_CARE_PROVIDER_SITE_OTHER): Payer: Medicare Other | Admitting: *Deleted

## 2016-06-10 DIAGNOSIS — J449 Chronic obstructive pulmonary disease, unspecified: Secondary | ICD-10-CM | POA: Diagnosis not present

## 2016-06-10 NOTE — Progress Notes (Signed)
SMW performed today. 

## 2016-06-27 NOTE — Progress Notes (Signed)
[facility] Consultation      Assessment and Plan:  COPD/emphysema, with chronic hypoxic respiratory failure. -on Breo 100 to be used as once daily. -Discussed trying to increase her activity level due to presence of debility/deconditioning. -Continue oxygen at 2 L with activity.  Lung nodules. -Reviewed, most recent CT of the chest images from 02/02/2015, lung nodules appear stable, since 04/29/2015. -We'll repeat CT chest in approximately 9 months, if stable at that time, patient will have completed about 18 months of surveillance.  Nicotine abuse. -Discussed the importance of smoking cessation, she does not feel that she is ready to quit at this time.  Sleep apnea.  -Continue with CPAP at 11, oxygen via CPAP.   Date: 06/27/2016  MRN# [id number] Crystal White July 24, 1941  Referring Physician:   MARISHA White is a 75 y.o. old female seen in consultation for chief complaint of:    Chief Complaint  Patient presents with  . Follow-up    SMW results; breathing at baseline, not any worse; prod cough at times w/clear to brown specs     HPI:   The patient is a 75 year old female with a history of COPD, obstructive sleep apnea, hypertension, GERD.  She was seen by Dr. Lake Bells in 06/02/2015. She was seen at that time for follow-up from hemoptysis and pulmonary nodules. At that time it appeared that the hemoptysis resolved, she had a CT of the chest on 04/29/2015 which showed bilateral emphysema and multiple pulmonary nodules, the largest of which was 7 mm.   She is here primarily to re-qualify for oxygen. Review of oxygen desat walk that showed the patient desatted to 87% on room air with ambulation, she required 2 L and oxygen sat improved to 95%   At her last visit it was noted that the lung nodules were stable, it was recommended that she undergo a final scan around December of this year to complete 18 months of surveillance.  She is taking Breo every day, she  got a new puppy, does not sleep in the bedroom, the dog helps her keep moving.  She is still having trouble with ADLs such as carrying groceries.  She is smoking half to 1 ppd.     Review of testing/summary: CT chest 02/02/2016:Stable sub-cm bilateral pulmonary nodules shows bilateral emphysematous changes in both lungs, CT chest 08/04/2015: Minimally changed, pulmonary nodules, the largest of which was 7 mm. No significant change when compared with previous CT film from 04/29/2015.  CPAP titration study 09/15/2015: Requires CPAP at a pressure of 11. Oxygen desaturations were noted. Sleep study 08/11/2015: Mild sleep apnea with an apnea-hypopnea index of 14.  Medication:       Allergies:  Contrast media [iodinated diagnostic agents]; Other; and Statins  Review of Systems: Gen:  Denies  fever, sweats, chills HEENT: Denies blurred vision, double vision. . Cvc:  No dizziness, chest pain. Resp:   Denies cough or sputum production. Gi: Denies swallowing difficulty, stomach pain. Gu:  Denies bladder incontinence, burning urine Ext:   No Joint pain, stiffness. Skin: No skin rash,  hives  Endoc:  No polyuria, polydipsia. Psych: No depression, insomnia. Other:  All other systems were reviewed with the patient and were negative other that what is mentioned in the HPI.   Physical Examination:   VS: BP 132/78 (BP Location: Right Arm, Cuff Size: Normal)   Pulse (!) 101   Ht 5' 4.5" (1.638 m)   Wt 170 lb (77.1 kg)   LMP  (  Approximate)   SpO2 95%   BMI 28.73 kg/m   General Appearance: No distress  Neuro:without focal findings,  speech normal,  HEENT: PERRLA, EOM intact.   Pulmonary: decreased air entry bilaterally.   CardiovascularNormal S1,S2.  No m/r/g.   Abdomen: Benign, Soft, non-tender. Renal:  No costovertebral tenderness  GU:  No performed at this time. Endoc: No evident thyromegaly, no signs of acromegaly. Skin:   warm, no rashes, no ecchymosis  Extremities: normal, no  cyanosis, clubbing.  Other findings:    LABORATORY PANEL:   CBC No results for input(s): WBC, HGB, HCT, PLT in the last 168 hours. ------------------------------------------------------------------------------------------------------------------  Chemistries  No results for input(s): NA, K, CL, CO2, GLUCOSE, BUN, CREATININE, CALCIUM, MG, AST, ALT, ALKPHOS, BILITOT in the last 168 hours.  Invalid input(s): GFRCGP ------------------------------------------------------------------------------------------------------------------  Cardiac Enzymes No results for input(s): TROPONINI in the last 168 hours. ------------------------------------------------------------  RADIOLOGY:  No results found.     Thank  you for the consultation and for allowing [facility] to assist in the care of your patient. Our recommendations are noted above.  Please contact us if we can be of further service.   Marda Stalker, MD.  Board Certified in Internal Medicine, Pulmonary Medicine, [place], and Sleep Medicine.  [facility] Office Number: [phone]  Patricia Pesa, M.D.  Vilinda Boehringer, M.D.  Merton Border, M.D  06/27/2016

## 2016-06-28 ENCOUNTER — Encounter: Payer: Self-pay | Admitting: Internal Medicine

## 2016-06-28 ENCOUNTER — Ambulatory Visit (INDEPENDENT_AMBULATORY_CARE_PROVIDER_SITE_OTHER): Payer: Medicare Other | Admitting: Internal Medicine

## 2016-06-28 VITALS — BP 132/78 | HR 101 | Ht 64.5 in | Wt 170.0 lb

## 2016-06-28 DIAGNOSIS — J449 Chronic obstructive pulmonary disease, unspecified: Secondary | ICD-10-CM | POA: Diagnosis not present

## 2016-06-28 DIAGNOSIS — R918 Other nonspecific abnormal finding of lung field: Secondary | ICD-10-CM | POA: Diagnosis not present

## 2016-06-28 NOTE — Patient Instructions (Addendum)
Quitting smoking is the best thing you can do for your health.   Continue breo once daily, rinse mouth after use.   CT scan  in December of this year, follow up after that.

## 2016-07-25 ENCOUNTER — Encounter: Payer: Self-pay | Admitting: Family Medicine

## 2016-07-25 ENCOUNTER — Ambulatory Visit (INDEPENDENT_AMBULATORY_CARE_PROVIDER_SITE_OTHER): Payer: Medicare Other | Admitting: Family Medicine

## 2016-07-25 VITALS — BP 148/78 | HR 76 | Temp 97.6°F | Resp 18 | Wt 175.0 lb

## 2016-07-25 DIAGNOSIS — E7800 Pure hypercholesterolemia, unspecified: Secondary | ICD-10-CM

## 2016-07-25 DIAGNOSIS — Z720 Tobacco use: Secondary | ICD-10-CM

## 2016-07-25 DIAGNOSIS — F419 Anxiety disorder, unspecified: Secondary | ICD-10-CM

## 2016-07-25 DIAGNOSIS — K2100 Gastro-esophageal reflux disease with esophagitis, without bleeding: Secondary | ICD-10-CM

## 2016-07-25 DIAGNOSIS — F32A Depression, unspecified: Secondary | ICD-10-CM

## 2016-07-25 DIAGNOSIS — J449 Chronic obstructive pulmonary disease, unspecified: Secondary | ICD-10-CM | POA: Diagnosis not present

## 2016-07-25 DIAGNOSIS — Z23 Encounter for immunization: Secondary | ICD-10-CM

## 2016-07-25 DIAGNOSIS — F329 Major depressive disorder, single episode, unspecified: Secondary | ICD-10-CM | POA: Diagnosis not present

## 2016-07-25 DIAGNOSIS — I10 Essential (primary) hypertension: Secondary | ICD-10-CM

## 2016-07-25 NOTE — Progress Notes (Signed)
Subjective:  HPI  Pt is here for a 6 month follow up for her chronic problems. However she is also having some lower back pain for several weeks. She has been taking Aleve for this and has helped some but not completely. Denies urinary symptoms. Pt thinks that it is arthritis. Denies injury. Pain is constant, achy pain.   COPD- pt reports that she has been having some yellowish sputum lately but other that her breathing is stable.   Patient continues to smoke daily.   Hypertension, follow-up:  BP Readings from Last 3 Encounters:  07/25/16 (!) 148/78  06/28/16 132/78  06/03/16 (!) 148/78    She was last seen for hypertension 6 months ago.  BP at that visit was 132/78. Management since that visit includes none. She reports good compliance with treatment. She is not having side effects.  She is exercising with new puppy. Outside blood pressures are being checked occasionally and runs about 120's/70's. She is experiencing dyspnea.  Patient denies chest pain, chest pressure/discomfort, claudication, exertional chest pressure/discomfort, fatigue, irregular heart beat, lower extremity edema, near-syncope, orthopnea, palpitations, paroxysmal nocturnal dyspnea and syncope.   Cardiovascular risk factors include advanced age (older than 52 for men, 75 for women), hypertension and smoking/ tobacco exposure.  Wt Readings from Last 3 Encounters:  07/25/16 175 lb (79.4 kg)  06/28/16 170 lb (77.1 kg)  06/03/16 175 lb (79.4 kg)   She has her wearing her CPAP for her obstructive sleep apnea ------------------------------------------------------------------------    Prior to Admission medications   Medication Sig Start Date End Date Taking? Authorizing Provider  albuterol (PROVENTIL HFA) 108 (90 BASE) MCG/ACT inhaler Inhale 2 puffs into the lungs every 6 (six) hours as needed for wheezing or shortness of breath.     Historical Provider, MD  alendronate (FOSAMAX) 70 MG tablet Take 1 tablet  by mouth once a week. 04/27/16   Historical Provider, MD  ALPRAZolam (XANAX) 0.25 MG tablet TAKE 1 TABLET BY MOUTH TWICE DAILY Patient taking differently: TAKE 1 TABLET BY MOUTH TWICE DAILY (pt uses as needed) 07/17/15   Jerrol Banana., MD  citalopram (CELEXA) 10 MG tablet Take 1 tablet (10 mg total) by mouth at bedtime. 08/10/15   Richard Maceo Pro., MD  fluticasone furoate-vilanterol (BREO ELLIPTA) 100-25 MCG/INH AEPB Inhale 1 puff into the lungs daily. 02/25/16   Laverle Hobby, MD  gabapentin (NEURONTIN) 100 MG capsule TAKE 2 CAPSULES BY MOUTH THREE TIMES DAILY 06/06/16   Jerrol Banana., MD  hydrochlorothiazide (HYDRODIURIL) 25 MG tablet Take 1 tablet (25 mg total) by mouth daily. 08/10/15   Richard Maceo Pro., MD  hyoscyamine (LEVBID) 0.375 MG 12 hr tablet Take 0.375 mg by mouth 2 (two) times daily as needed for cramping. Reported on 05/03/2016    Historical Provider, MD  losartan (COZAAR) 50 MG tablet Take 1 tablet (50 mg total) by mouth at bedtime. 08/10/15   Richard Maceo Pro., MD  meclizine (ANTIVERT) 25 MG tablet Take 1 tablet by mouth 2 (two) times daily as needed for dizziness.  10/23/13   Historical Provider, MD  mirtazapine (REMERON) 30 MG tablet TAKE 1 TABLET BY MOUTH AT BEDTIME 04/18/16   Richard Maceo Pro., MD  oxyCODONE-acetaminophen (PERCOCET) 5-325 MG tablet Take 1 tablet by mouth every 4 (four) hours as needed for severe pain. 05/03/16   Amy Dennie Maizes, MD  OXYGEN Inhale into the lungs daily. Uses at night with CPAP and PRN during daytime  Historical Provider, MD  pantoprazole (PROTONIX) 40 MG tablet Take 1 tablet (40 mg total) by mouth 2 (two) times daily. 08/10/15   Richard Maceo Pro., MD  predniSONE (STERAPRED UNI-PAK 21 TAB) 10 MG (21) TBPK tablet Take 1 tablet by mouth daily. 05/17/16   Historical Provider, MD  simethicone (MYLICON) 80 MG chewable tablet Chew 160 mg by mouth 2 (two) times daily as needed for flatulence.    Historical Provider, MD    traZODone (DESYREL) 150 MG tablet Take 1 tablet by mouth  every night at bedtime 12/29/15   Jerrol Banana., MD  Vitamin D, Ergocalciferol, (DRISDOL) 50000 units CAPS capsule Take 1 capsule by mouth once a week. 03/22/16   Historical Provider, MD    Patient Active Problem List   Diagnosis Date Noted  . Incisional hernia, without obstruction or gangrene 06/03/2016  . Osteoporosis 02/22/2016  . Musculoskeletal chest pain 07/14/2015  . Chronic respiratory failure ([place]) 07/14/2015  . Acute bronchitis 07/14/2015  . Other emphysema ([place]) 07/14/2015  . Chronic renal insufficiency 07/14/2015  . Chest pain 07/13/2015  . Multiple pulmonary nodules 06/02/2015  . Colon polyp 05/29/2015  . Hemoptysis 04/29/2015  . Sprain of ankle 04/14/2015  . Anxiety 04/14/2015  . Barrett's esophagus 04/14/2015  . Acute exacerbation of chronic obstructive airways disease ([place]) 04/14/2015  . Bursitis 04/14/2015  . Cellulitis 04/14/2015  . Claudication ([place]) 04/14/2015  . Cough 04/14/2015  . Deficiency, 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  . Clinical depression 04/14/2015  . Dizziness 04/14/2015  . Dyslipidemia 04/14/2015  . Essential (primary) hypertension 04/14/2015  . Flank pain 04/14/2015  . H/O suicide attempt 04/14/2015  . Dysphonia 04/14/2015  . Hypercholesteremia 04/14/2015  . Cannot sleep 04/14/2015  . Malaise and fatigue 04/14/2015  . Mild cognitive impairment 04/14/2015  . Cramp in muscle 04/14/2015  . Muscle ache 04/14/2015  . Neuropathy ([place]) 04/14/2015  . Adiposity 04/14/2015  . 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 Candida infection of mouth 04/14/2015  . Abnormal loss of weight 04/14/2015  . Decreased body weight 04/14/2015  . Artificial cardiac pacemaker 10/08/2014  . Apnea, sleep 10/08/2014  . COPD (chronic obstructive pulmonary disease) ([place]) 02/13/2012  . Tobacco abuse 02/13/2012  . Sinus congestion 02/13/2012  . Current tobacco use 02/13/2012  . Esophagitis, reflux 08/28/2006   . Abdominal pain, right lower quadrant 08/22/2005  . Acute appendicitis with peritoneal abscess 08/22/2005    Past Medical History:  Diagnosis Date  . Arthritis    hands, lower back  . Barrett's esophagus   . COPD (chronic obstructive pulmonary disease) ([place])   . Emphysema lung ([place])   . GERD (gastroesophageal reflux disease)   . Hyperlipidemia   . Hypertension   . Mobitz type II atrioventricular block   . OSA (obstructive sleep apnea)    on CPAP  . Peripheral neuropathy ([place])   . Presence of permanent cardiac pacemaker 09/22/11   Biotronik - [facility]IADR-T [facility])  . Shortness of breath dyspnea   . Stomach ulcer     Social History   Social History  . Marital status: Widowed    Spouse name: widowed  . Number of children: Y  . Years of education: N/A   Occupational History  . retired Personal assistant    Social History Main Topics  . Smoking status: Current Every Day Smoker    Packs/day: 0.50    Years: 55.00    Types: Cigarettes  . Smokeless tobacco: Never Used  . Alcohol use 0.0 oz/week  Comment: occcasionall 1-2  glass wine a month  . Drug use: No  . Sexual activity: No   Other Topics Concern  . Not on file   Social History Narrative   Pt was adopted.    Lives at home by herself.   Not on home oxygen.    Allergies  Allergen Reactions  . Contrast Media [Iodinated Diagnostic Agents] Other (See Comments) and Rash    Pain and burning Reaction:  Burning and redness   . Other Rash    Pain and burning  . Statins Other (See Comments)    Reaction:  Leg cramps   . Bacitracin Swelling and Rash    Review of Systems  Constitutional: Negative.   HENT: Negative.   Eyes: Negative.   Respiratory: Positive for cough and sputum production.   Cardiovascular: Negative.   Gastrointestinal: Negative.   Genitourinary: Negative.   Musculoskeletal: Positive for back pain.  Skin: Negative.   Neurological: Negative.   Endo/Heme/Allergies: Negative.    Psychiatric/Behavioral: Negative.     Immunization History  Administered Date(s) Administered  . Influenza Whole 08/28/2012  . Influenza, High Dose Seasonal PF 08/10/2015  . Influenza-Unspecified 08/28/2013  . Pneumococcal Conjugate-13 08/28/2014  . Pneumococcal Polysaccharide-23 03/07/2011  . Tdap 08/28/2014  . Zoster 11/04/2012   Objective:  BP (!) 148/78 (BP Location: Left Arm, Patient Position: Sitting, Cuff Size: Large)   Pulse 76   Temp 97.6 F (36.4 C) (Oral)   Resp 18   Wt 175 lb (79.4 kg)   LMP  (Approximate)   SpO2 94%   BMI 29.57 kg/m   Physical Exam  Constitutional: She is oriented to person, place, and time and well-developed, well-nourished, and in no distress.  HENT:  Head: Normocephalic and atraumatic.  Right Ear: External ear normal.  Left Ear: External ear normal.  Nose: Nose normal.  Eyes: Conjunctivae and EOM are normal. Pupils are equal, round, and reactive to light.  Neck: Normal range of motion. Neck supple.  Cardiovascular: Normal rate, regular rhythm, normal heart sounds and intact distal pulses.   Pulmonary/Chest: Effort normal and breath sounds normal.  Abdominal: Soft.  Musculoskeletal: She exhibits tenderness (across lower back speically over both SI joints. ).  Neurological: She is alert and oriented to person, place, and time. Gait normal. GCS score is 15.  Skin: Skin is warm and dry.  Psychiatric: Mood, memory, affect and judgment normal.    Lab Results  Component Value Date   WBC 26.3 (H) 05/27/2016   HGB 14.7 05/27/2016   HCT 44.8 05/27/2016   PLT 205 05/27/2016   GLUCOSE 117 (H) 05/27/2016   CHOL 288 (A) 08/28/2014   TRIG 252 (A) 08/28/2014   HDL 49 08/28/2014   LDLCALC 189 08/28/2014   TSH 1.41 08/28/2014    CMP     Component Value Date/Time   NA 135 05/27/2016 1045   NA 142 08/28/2014   NA 141 12/24/2012 1108   K 3.6 05/27/2016 1045   K 3.9 12/24/2012 1108   CL 96 (L) 05/27/2016 1045   CL 101 12/24/2012 1108    CO2 26 05/27/2016 1045   CO2 29 12/24/2012 1108   GLUCOSE 117 (H) 05/27/2016 1045   GLUCOSE 112 (H) 12/24/2012 1108   BUN 20 05/27/2016 1045   BUN 18 08/28/2014   BUN 19 (H) 12/24/2012 1108   CREATININE 1.38 (H) 05/27/2016 1045   CREATININE 1.36 (H) 12/24/2012 1108   CALCIUM 8.4 (L) 05/27/2016 1045   CALCIUM 9.6 12/24/2012 1108  PROT 5.9 (L) 06/23/2015 0341   PROT 7.9 12/24/2012 1108   ALBUMIN 3.1 (L) 06/23/2015 0341   ALBUMIN 4.0 12/24/2012 1108   AST 12 (L) 06/23/2015 0341   AST 17 12/24/2012 1108   ALT 9 (L) 06/23/2015 0341   ALT 21 12/24/2012 1108   ALKPHOS 56 06/23/2015 0341   ALKPHOS 107 12/24/2012 1108   BILITOT 0.3 06/23/2015 0341   BILITOT 0.4 12/24/2012 1108   GFRNONAA 37 (L) 05/27/2016 1045   GFRNONAA 39 (L) 12/24/2012 1108   GFRAA 43 (L) 05/27/2016 1045   GFRAA 46 (L) 12/24/2012 1108    Assessment and Plan :  1. Essential (primary) hypertension  - TSH - CBC with Differential/Platelet  2. Hypercholesteremia  - Lipid Panel With LDL/HDL Ratio - Comprehensive metabolic panel  3. Current tobacco use Time spent strongly advised patient to quit smoking.  4. Anxiety Stable  5. Chronic obstructive pulmonary disease, unspecified COPD type ([place])   6. Esophagitis, reflux Chronic. Patient has had surgery for reflux. 7. Clinical depression iN REMISSION.  8. Need for influenza vaccination - Flu vaccine HIGH DOSE PF 9.OSA On CPAP  Patient was seen and examined by Dr. [name], and noted scribed by Webb Laws, [place] MD [facility] 07/25/2016 1:52 PM

## 2016-07-26 LAB — LIPID PANEL WITH LDL/HDL RATIO
Cholesterol, Total: 292 mg/dL — ABNORMAL HIGH (ref 100–199)
HDL: 45 mg/dL (ref >39)
LDL CALC: 185 mg/dL — AB (ref 0–99)
LDL/HDL RATIO: 4.1 ratio — AB (ref 0.0–3.2)
TRIGLYCERIDES: 312 mg/dL — AB (ref 0–149)
VLDL CHOLESTEROL CAL: 62 mg/dL — AB (ref 5–40)

## 2016-07-26 LAB — COMPREHENSIVE METABOLIC PANEL
A/G RATIO: 1.6 (ref 1.2–2.2)
ALBUMIN: 4.1 g/dL (ref 3.5–4.8)
ALK PHOS: 87 IU/L (ref 39–117)
ALT: 12 IU/L (ref 0–32)
AST: 20 IU/L (ref 0–40)
BUN / CREAT RATIO: 15 (ref 12–28)
BUN: 19 mg/dL (ref 8–27)
Bilirubin Total: 0.2 mg/dL (ref 0.0–1.2)
CO2: 25 mmol/L (ref 18–29)
CREATININE: 1.27 mg/dL — AB (ref 0.57–1.00)
Calcium: 10 mg/dL (ref 8.7–10.3)
Chloride: 100 mmol/L (ref 96–106)
GFR calc Af Amer: 48 mL/min/{1.73_m2} — ABNORMAL LOW (ref >59)
GFR, EST NON AFRICAN AMERICAN: 42 mL/min/{1.73_m2} — AB (ref >59)
GLOBULIN, TOTAL: 2.6 g/dL (ref 1.5–4.5)
Glucose: 92 mg/dL (ref 65–99)
POTASSIUM: 5.1 mmol/L (ref 3.5–5.2)
SODIUM: 141 mmol/L (ref 134–144)
Total Protein: 6.7 g/dL (ref 6.0–8.5)

## 2016-07-26 LAB — CBC WITH DIFFERENTIAL/PLATELET

## 2016-07-26 LAB — TSH: TSH: 1.41 u[IU]/mL (ref 0.450–4.500)

## 2016-08-07 ENCOUNTER — Other Ambulatory Visit: Payer: Self-pay | Admitting: Family Medicine

## 2016-09-10 ENCOUNTER — Other Ambulatory Visit: Payer: Self-pay | Admitting: Family Medicine

## 2016-09-14 ENCOUNTER — Other Ambulatory Visit: Payer: Self-pay

## 2016-09-14 ENCOUNTER — Telehealth: Payer: Self-pay

## 2016-09-14 MED ORDER — MIRTAZAPINE 30 MG PO TABS: 30.0000 mg | ORAL_TABLET | Freq: Every day | ORAL | 3 refills | Status: DC

## 2016-09-14 MED ORDER — HYOSCYAMINE SULFATE ER 0.375 MG PO TB12: 0.3750 mg | ORAL_TABLET | Freq: Two times a day (BID) | ORAL | 3 refills | Status: DC | PRN

## 2016-09-14 MED ORDER — GABAPENTIN 100 MG PO CAPS: 200.0000 mg | ORAL_CAPSULE | Freq: Three times a day (TID) | ORAL | 3 refills | Status: DC

## 2016-09-14 MED ORDER — ALBUTEROL SULFATE HFA 108 (90 BASE) MCG/ACT IN AERS: 2.0000 | INHALATION_SPRAY | Freq: Four times a day (QID) | RESPIRATORY_TRACT | 3 refills | Status: DC | PRN

## 2016-09-14 MED ORDER — TRAZODONE HCL 150 MG PO TABS: 150.0000 mg | ORAL_TABLET | Freq: Every day | ORAL | 3 refills | Status: DC

## 2016-09-14 MED ORDER — FLUTICASONE FUROATE-VILANTEROL 100-25 MCG/INH IN AEPB: 1.0000 | INHALATION_SPRAY | Freq: Every day | RESPIRATORY_TRACT | 3 refills | Status: DC

## 2016-09-14 MED ORDER — PANTOPRAZOLE SODIUM 40 MG PO TBEC: 40.0000 mg | DELAYED_RELEASE_TABLET | Freq: Two times a day (BID) | ORAL | 3 refills | Status: DC

## 2016-09-14 MED ORDER — ALENDRONATE SODIUM 70 MG PO TABS: 70.0000 mg | ORAL_TABLET | ORAL | 3 refills | Status: DC

## 2016-09-14 MED ORDER — CITALOPRAM HYDROBROMIDE 10 MG PO TABS: 10.0000 mg | ORAL_TABLET | Freq: Every day | ORAL | 3 refills | Status: DC

## 2016-09-14 MED ORDER — LOSARTAN POTASSIUM 50 MG PO TABS: 50.0000 mg | ORAL_TABLET | Freq: Every day | ORAL | 3 refills | Status: DC

## 2016-09-14 MED ORDER — VITAMIN D (ERGOCALCIFEROL) 1.25 MG (50000 UNIT) PO CAPS: 50000.0000 [IU] | ORAL_CAPSULE | ORAL | 3 refills | Status: DC

## 2016-09-14 MED ORDER — MECLIZINE HCL 25 MG PO TABS: 25.0000 mg | ORAL_TABLET | Freq: Two times a day (BID) | ORAL | 3 refills | Status: DC | PRN

## 2016-09-14 MED ORDER — HYDROCHLOROTHIAZIDE 25 MG PO TABS: 25.0000 mg | ORAL_TABLET | Freq: Every day | ORAL | 3 refills | Status: DC

## 2016-09-14 MED ORDER — SIMETHICONE 80 MG PO CHEW: 160.0000 mg | CHEWABLE_TABLET | Freq: Two times a day (BID) | ORAL | 3 refills | Status: DC | PRN

## 2016-09-14 NOTE — Telephone Encounter (Signed)
Patient is requesting a refill on ALL medications be sent to [facility]. Patient's medication list is correct.

## 2016-09-14 NOTE — Telephone Encounter (Signed)
ok 

## 2016-09-14 NOTE — Telephone Encounter (Signed)
Ok?-aa 

## 2016-09-14 NOTE — Telephone Encounter (Signed)
All of her regular medication has been sent to [facility].  Do you want her having 90 supplies of her controlled medications?  Thanks ED

## 2016-09-15 ENCOUNTER — Other Ambulatory Visit: Payer: Self-pay

## 2016-09-15 MED ORDER — ALPRAZOLAM 0.25 MG PO TABS: 0.2500 mg | ORAL_TABLET | Freq: Two times a day (BID) | ORAL | 0 refills | Status: DC

## 2016-09-15 NOTE — Telephone Encounter (Signed)
No--30 for those--she attempted suicide about 10 years ago.

## 2016-10-18 DIAGNOSIS — I10 Essential (primary) hypertension: Secondary | ICD-10-CM | POA: Diagnosis not present

## 2016-10-18 DIAGNOSIS — E7800 Pure hypercholesterolemia, unspecified: Secondary | ICD-10-CM | POA: Diagnosis not present

## 2016-10-18 DIAGNOSIS — F17200 Nicotine dependence, unspecified, uncomplicated: Secondary | ICD-10-CM | POA: Diagnosis not present

## 2016-10-18 DIAGNOSIS — R0602 Shortness of breath: Secondary | ICD-10-CM | POA: Diagnosis not present

## 2016-10-18 DIAGNOSIS — I739 Peripheral vascular disease, unspecified: Secondary | ICD-10-CM | POA: Diagnosis not present

## 2016-10-18 DIAGNOSIS — J410 Simple chronic bronchitis: Secondary | ICD-10-CM | POA: Diagnosis not present

## 2016-10-18 DIAGNOSIS — I442 Atrioventricular block, complete: Secondary | ICD-10-CM | POA: Diagnosis not present

## 2016-10-18 DIAGNOSIS — G4733 Obstructive sleep apnea (adult) (pediatric): Secondary | ICD-10-CM | POA: Diagnosis not present

## 2016-10-18 DIAGNOSIS — Z950 Presence of cardiac pacemaker: Secondary | ICD-10-CM | POA: Diagnosis not present

## 2016-10-25 ENCOUNTER — Ambulatory Visit
Admission: RE | Admit: 2016-10-25 | Discharge: 2016-10-25 | Disposition: A | Discharge status: Home / Self Care | Payer: Medicare Other | Source: Ambulatory Visit | Attending: Internal Medicine | Admitting: Internal Medicine

## 2016-10-25 DIAGNOSIS — R918 Other nonspecific abnormal finding of lung field: Secondary | ICD-10-CM

## 2016-10-25 DIAGNOSIS — J432 Centrilobular emphysema: Secondary | ICD-10-CM | POA: Insufficient documentation

## 2016-10-27 ENCOUNTER — Encounter: Payer: Self-pay | Admitting: Family Medicine

## 2016-10-27 ENCOUNTER — Ambulatory Visit (INDEPENDENT_AMBULATORY_CARE_PROVIDER_SITE_OTHER): Payer: Medicare Other | Admitting: Family Medicine

## 2016-10-27 VITALS — BP 122/72 | HR 95 | Temp 97.8°F | Resp 18 | Wt 170.0 lb

## 2016-10-27 DIAGNOSIS — R42 Dizziness and giddiness: Secondary | ICD-10-CM

## 2016-10-27 MED ORDER — DIAZEPAM 2 MG PO TABS: 2.0000 mg | ORAL_TABLET | Freq: Three times a day (TID) | ORAL | 0 refills | Status: DC | PRN

## 2016-10-27 NOTE — Progress Notes (Signed)
Subjective:  HPI Pt is here for dizziness. She reports that it started Sunday and is only when she moves around, when she is sitting you is ok. She has been taking Meclizine but does not sem to be helping. Denies any new shortness of breath (she has COPD), or chest pain. She has had vertigo in the past but is unsure if this is the same because she does not remember. Pt had an appt with cardiologist last week and she does not feel it is heart related.   Prior to Admission medications   Medication Sig Start Date End Date Taking? Authorizing Provider  albuterol (PROVENTIL HFA) 108 (90 Base) MCG/ACT inhaler Inhale 2 puffs into the lungs every 6 (six) hours as needed for wheezing or shortness of breath. 09/14/16  Yes Richard Maceo Pro., MD  alendronate (FOSAMAX) 70 MG tablet Take 1 tablet (70 mg total) by mouth once a week. 09/14/16  Yes Richard Maceo Pro., MD  ALPRAZolam Duanne Moron) 0.25 MG tablet Take 1 tablet (0.25 mg total) by mouth 2 (two) times daily. 09/15/16  Yes Richard Maceo Pro., MD  citalopram (CELEXA) 10 MG tablet Take 1 tablet (10 mg total) by mouth at bedtime. 09/14/16  Yes Richard Maceo Pro., MD  fluticasone furoate-vilanterol (BREO ELLIPTA) 100-25 MCG/INH AEPB Inhale 1 puff into the lungs daily. 09/14/16  Yes Richard Maceo Pro., MD  gabapentin (NEURONTIN) 100 MG capsule Take 2 capsules (200 mg total) by mouth 3 (three) times daily. 09/14/16  Yes Richard Maceo Pro., MD  hydrochlorothiazide (HYDRODIURIL) 25 MG tablet Take 1 tablet (25 mg total) by mouth daily. 09/14/16  Yes Richard Maceo Pro., MD  hyoscyamine (LEVBID) 0.375 MG 12 hr tablet Take 1 tablet (0.375 mg total) by mouth 2 (two) times daily as needed for cramping. Reported on 05/03/2016 09/14/16  Yes Richard Maceo Pro., MD  losartan (COZAAR) 50 MG tablet Take 1 tablet (50 mg total) by mouth at bedtime. 09/14/16  Yes Richard Maceo Pro., MD  meclizine (ANTIVERT) 25 MG tablet Take 1 tablet (25 mg total) by  mouth 2 (two) times daily as needed for dizziness. 09/14/16  Yes Richard Maceo Pro., MD  mirtazapine (REMERON) 30 MG tablet Take 1 tablet (30 mg total) by mouth at bedtime. 09/14/16  Yes Richard Maceo Pro., MD  oxyCODONE-acetaminophen (PERCOCET) 5-325 MG tablet Take 1 tablet by mouth every 4 (four) hours as needed for severe pain. 05/03/16  Yes Amy Dennie Maizes, MD  OXYGEN Inhale into the lungs daily. Uses at night with CPAP and PRN during daytime   Yes Historical Provider, MD  pantoprazole (PROTONIX) 40 MG tablet Take 1 tablet (40 mg total) by mouth 2 (two) times daily. 09/14/16  Yes Richard Maceo Pro., MD  simethicone (MYLICON) 80 MG chewable tablet Chew 2 tablets (160 mg total) by mouth 2 (two) times daily as needed for flatulence. 09/14/16  Yes Richard Maceo Pro., MD  traZODone (DESYREL) 150 MG tablet Take 1 tablet (150 mg total) by mouth at bedtime. 09/14/16  Yes Richard Maceo Pro., MD  Vitamin D, Ergocalciferol, (DRISDOL) 50000 units CAPS capsule Take 1 capsule (50,000 Units total) by mouth once a week. 09/14/16  Yes Richard Maceo Pro., MD    Patient Active Problem List   Diagnosis Date Noted  . Incisional hernia, without obstruction or gangrene 06/03/2016  . Osteoporosis 02/22/2016  . Musculoskeletal chest pain 07/14/2015  . Chronic respiratory failure ([place]) 07/14/2015  .  Acute bronchitis 07/14/2015  . Other emphysema ([place]) 07/14/2015  . Chronic renal insufficiency 07/14/2015  . Chest pain 07/13/2015  . Multiple pulmonary nodules 06/02/2015  . Colon polyp 05/29/2015  . Hemoptysis 04/29/2015  . Sprain of ankle 04/14/2015  . Anxiety 04/14/2015  . Barrett's esophagus 04/14/2015  . Acute exacerbation of chronic obstructive airways disease ([place]) 04/14/2015  . Bursitis 04/14/2015  . Cellulitis 04/14/2015  . Claudication ([place]) 04/14/2015  . Cough 04/14/2015  . Deficiency, 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  . Clinical depression 04/14/2015  . Dizziness 04/14/2015  .  Dyslipidemia 04/14/2015  . Essential (primary) hypertension 04/14/2015  . Flank pain 04/14/2015  . H/O suicide attempt 04/14/2015  . Dysphonia 04/14/2015  . Hypercholesteremia 04/14/2015  . Cannot sleep 04/14/2015  . Malaise and fatigue 04/14/2015  . Mild cognitive impairment 04/14/2015  . Cramp in muscle 04/14/2015  . Muscle ache 04/14/2015  . Neuropathy ([place]) 04/14/2015  . Adiposity 04/14/2015  . 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 Candida infection of mouth 04/14/2015  . Abnormal loss of weight 04/14/2015  . Decreased body weight 04/14/2015  . Artificial cardiac pacemaker 10/08/2014  . Apnea, sleep 10/08/2014  . COPD (chronic obstructive pulmonary disease) ([place]) 02/13/2012  . Tobacco abuse 02/13/2012  . Sinus congestion 02/13/2012  . Current tobacco use 02/13/2012  . Esophagitis, reflux 08/28/2006  . Abdominal pain, right lower quadrant 08/22/2005  . Acute appendicitis with peritoneal abscess 08/22/2005    Past Medical History:  Diagnosis Date  . Arthritis    hands, lower back  . Barrett's esophagus   . COPD (chronic obstructive pulmonary disease) ([place])   . Emphysema lung ([place])   . GERD (gastroesophageal reflux disease)   . Hyperlipidemia   . Hypertension   . Mobitz type II atrioventricular block   . OSA (obstructive sleep apnea)    on CPAP  . Peripheral neuropathy ([place])   . Presence of permanent cardiac pacemaker 09/22/11   Biotronik - [facility]IADR-T [facility])  . Shortness of breath dyspnea   . Stomach ulcer     Social History   Social History  . Marital status: Widowed    Spouse name: widowed  . Number of children: Y  . Years of education: N/A   Occupational History  . retired Personal assistant    Social History Main Topics  . Smoking status: Current Every Day Smoker    Packs/day: 1.00    Years: 55.00    Types: Cigarettes  . Smokeless tobacco: Never Used  . Alcohol use 0.0 oz/week     Comment: occcasionall 1-2  glass wine a month  . Drug use: No  . Sexual  activity: No   Other Topics Concern  . Not on file   Social History Narrative   Pt was adopted.    Lives at home by herself.   Not on home oxygen.    Allergies  Allergen Reactions  . Contrast Media [Iodinated Diagnostic Agents] Other (See Comments) and Rash    Pain and burning Reaction:  Burning and redness   . Other Rash    Pain and burning  . Statins Other (See Comments)    Reaction:  Leg cramps   . Bacitracin Swelling and Rash    Review of Systems  Constitutional: Negative.   HENT: Negative.   Eyes: Positive for blurred vision (slightly).  Respiratory: Positive for cough and shortness of breath.   Cardiovascular: Negative.   Gastrointestinal: Negative.   Genitourinary: Negative.   Musculoskeletal: Negative.   Skin: Negative.  Neurological: Positive for dizziness.  Endo/Heme/Allergies: Negative.   Psychiatric/Behavioral: Negative.     Immunization History  Administered Date(s) Administered  . Influenza Whole 08/28/2012  . Influenza, High Dose Seasonal PF 08/10/2015, 07/25/2016  . Influenza-Unspecified 08/28/2013  . Pneumococcal Conjugate-13 08/28/2014  . Pneumococcal Polysaccharide-23 03/07/2011  . Tdap 08/28/2014  . Zoster 11/04/2012    Objective:  BP 122/72 (BP Location: Left Arm, Patient Position: Sitting, Cuff Size: Large)   Pulse 95   Temp 97.8 F (36.6 C) (Oral)   Resp 18   Wt 170 lb (77.1 kg)   LMP  (Approximate)   SpO2 90%   BMI 28.73 kg/m   Physical Exam  Constitutional: She is oriented to person, place, and time and well-developed, well-nourished, and in no distress.  HENT:  Head: Normocephalic and atraumatic.  Right Ear: External ear normal.  Left Ear: External ear normal.  Nose: Nose normal.  Eyes: Conjunctivae and EOM are normal. Pupils are equal, round, and reactive to light.  Significant nystagmus to the right  Cardiovascular: Normal rate, regular rhythm, normal heart sounds and intact distal pulses.   Pulmonary/Chest: Effort  normal and breath sounds normal.  Abdominal: Soft.  Neurological: She is alert and oriented to person, place, and time. She has normal reflexes. No cranial nerve deficit. She exhibits normal muscle tone. Gait normal. Coordination normal. GCS score is 15.  Vertigo with gaze to right. Gait and Romberg Normal.  Skin: Skin is warm and dry.  Psychiatric: Mood, memory, affect and judgment normal.    Lab Results  Component Value Date   WBC CANCELED 07/25/2016   HGB 14.7 05/27/2016   HCT CANCELED 07/25/2016   PLT CANCELED 07/25/2016   GLUCOSE 92 07/25/2016   CHOL 292 (H) 07/25/2016   TRIG 312 (H) 07/25/2016   HDL 45 07/25/2016   LDLCALC 185 (H) 07/25/2016   TSH 1.410 07/25/2016    CMP     Component Value Date/Time   NA 141 07/25/2016 1442   NA 141 12/24/2012 1108   K 5.1 07/25/2016 1442   K 3.9 12/24/2012 1108   CL 100 07/25/2016 1442   CL 101 12/24/2012 1108   CO2 25 07/25/2016 1442   CO2 29 12/24/2012 1108   GLUCOSE 92 07/25/2016 1442   GLUCOSE 117 (H) 05/27/2016 1045   GLUCOSE 112 (H) 12/24/2012 1108   BUN 19 07/25/2016 1442   BUN 19 (H) 12/24/2012 1108   CREATININE 1.27 (H) 07/25/2016 1442   CREATININE 1.36 (H) 12/24/2012 1108   CALCIUM 10.0 07/25/2016 1442   CALCIUM 9.6 12/24/2012 1108   PROT 6.7 07/25/2016 1442   PROT 7.9 12/24/2012 1108   ALBUMIN 4.1 07/25/2016 1442   ALBUMIN 4.0 12/24/2012 1108   AST 20 07/25/2016 1442   AST 17 12/24/2012 1108   ALT 12 07/25/2016 1442   ALT 21 12/24/2012 1108   ALKPHOS 87 07/25/2016 1442   ALKPHOS 107 12/24/2012 1108   BILITOT 0.2 07/25/2016 1442   BILITOT 0.4 12/24/2012 1108   GFRNONAA 42 (L) 07/25/2016 1442   GFRNONAA 39 (L) 12/24/2012 1108   GFRAA 48 (L) 07/25/2016 1442   GFRAA 46 (L) 12/24/2012 1108    Assessment and Plan :  1. Vertigo Push fluids. May need ENT or neurology referral. - diazepam (VALIUM) 2 MG tablet; Take 1 tablet (2 mg total) by mouth 3 (three) times daily as needed for anxiety.  Dispense: 90 tablet;  Refill: 0 2.Smoking Patient strongly advised to quit. Son is present. HPI, Exam, and A&P  Transcribed under the direction and in the presence of Richard L. Cranford Mon, MD  Electronically Signed: Webb Laws, [place] MD [facility] 10/27/2016 10:53 AM

## 2016-10-27 NOTE — Progress Notes (Signed)
[facility] Consultation      Assessment and Plan:  COPD/emphysema, with chronic hypoxic respiratory failure. -on Breo 100 to be used as once daily. -Discussed trying to increase her activity level due to presence of debility/deconditioning. -Continue oxygen at 2 L with activity.  Lung nodules. -Reviewed, most recent CT of the chest images from 04/29/2015, lung nodules appear stable, since 02/02/2015 -CT chest 10/25/16--no change in size of nodules, completed about 18 months of surveillance, and no further surveillance would be recommended for these nodules.   Nicotine abuse. -Discussed the importance of smoking cessation, she does feel that she is ready to quit at this time.  -We'll give her prescription for Chantix, she is already set a quit date of her birthday on [birth year], asked her to start the Chantix 1 week before and then stop smoking. Her quit date.  Sleep apnea.  -Continue with CPAP at 11, oxygen via CPAP.   Date: 10/27/2016  MRN# [id number] KAMMIE GEARHEART [birth year]  Referring Physician:   SHRAVYA JULSON is a 75 y.o. old female seen in consultation for chief complaint of:    Chief Complaint  Patient presents with  . Follow-up    SOB w/activity: prod cough w/green mucus:     HPI:   The patient is a 75 year old female with a history of COPD, obstructive sleep apnea, hypertension, GERD.  She was seen by Dr. Lake Bells in 06/02/2015. She was seen at that time for follow-up from hemoptysis and pulmonary nodules. At that time it appeared that the hemoptysis resolved, she had a CT of the chest on 04/29/2015 which showed bilateral emphysema and multiple pulmonary nodules, the largest of which was 7 mm.   She is here primarily to re-qualify for oxygen. Review of oxygen desat walk that showed the patient desatted to 87% on room air with ambulation, she required 2 L and oxygen sat improved to 95%   At her last visit it was noted that the lung nodules were  stable, it was recommended that she undergo a final scan around December of this year to complete 18 months of surveillance.  She is taking Breo every day, she got a new puppy, does not sleep in the bedroom, the dog helps her keep moving.  She is still having trouble with ADLs such as carrying groceries.  She is smoking half to 1 ppd.  She has quit in the past with chantix.   She is using her CPAP every night, with oxygen. She is supposed to be on ambulatory oxygen but does not have it with her today.   Review of testing/summary: CT chest 08/25/16 reviewed, no significant change from previous.   Previous testing: CT chest 02/02/2016:Stable sub-cm bilateral pulmonary nodules shows bilateral emphysematous changes in both lungs, CT chest 08/04/2015: Minimally changed, pulmonary nodules, the largest of which was 7 mm. No significant change when compared with previous CT film from 04/29/2015.  CPAP titration study 09/15/2015: Requires CPAP at a pressure of 11. Oxygen desaturations were noted. Sleep study 08/11/2015: Mild sleep apnea with an apnea-hypopnea index of 14.  Medication:       Allergies:  Contrast media [iodinated diagnostic agents]; Other; Statins; and Bacitracin  Review of Systems: Gen:  Denies  fever, sweats, chills HEENT: Denies blurred vision, double vision. . Cvc:  No dizziness, chest pain. Resp:   Denies cough or sputum production. Gi: Denies swallowing difficulty, stomach pain. Gu:  Denies bladder incontinence, burning urine Ext:   No Joint pain,  stiffness. Skin: No skin rash,  hives  Endoc:  No polyuria, polydipsia. Psych: No depression, insomnia. Other:  All other systems were reviewed with the patient and were negative other that what is mentioned in the HPI.   Physical Examination:   VS: BP 122/78 (BP Location: Left Arm, Cuff Size: Normal)   Pulse 95   Wt 168 lb (76.2 kg)   LMP  (Approximate)   SpO2 90%   BMI 28.39 kg/m   General Appearance: No distress    Neuro:without focal findings,  speech normal,  HEENT: PERRLA, EOM intact.   Pulmonary: decreased air entry bilaterally.   CardiovascularNormal S1,S2.  No m/r/g.   Abdomen: Benign, Soft, non-tender. Renal:  No costovertebral tenderness  GU:  No performed at this time. Endoc: No evident thyromegaly, no signs of acromegaly. Skin:   warm, no rashes, no ecchymosis  Extremities: normal, no cyanosis, clubbing.  Other findings:    LABORATORY PANEL:   CBC No results for input(s): WBC, HGB, HCT, PLT in the last 168 hours. ------------------------------------------------------------------------------------------------------------------  Chemistries  No results for input(s): NA, K, CL, CO2, GLUCOSE, BUN, CREATININE, CALCIUM, MG, AST, ALT, ALKPHOS, BILITOT in the last 168 hours.  Invalid input(s): GFRCGP ------------------------------------------------------------------------------------------------------------------  Cardiac Enzymes No results for input(s): TROPONINI in the last 168 hours. ------------------------------------------------------------  RADIOLOGY:  No results found.     Thank  you for the consultation and for allowing [facility] to assist in the care of your patient. Our recommendations are noted above.  Please contact us if we can be of further service.   Marda Stalker, MD.  Board Certified in Internal Medicine, Pulmonary Medicine, [place], and Sleep Medicine.  [facility] Office Number: [phone]  Patricia Pesa, M.D.  Vilinda Boehringer, M.D.  Merton Border, M.D  10/27/2016

## 2016-10-28 ENCOUNTER — Encounter: Payer: Self-pay | Admitting: Internal Medicine

## 2016-10-28 ENCOUNTER — Ambulatory Visit (INDEPENDENT_AMBULATORY_CARE_PROVIDER_SITE_OTHER): Payer: Medicare Other | Admitting: Internal Medicine

## 2016-10-28 VITALS — BP 122/78 | HR 95 | Wt 168.0 lb

## 2016-10-28 DIAGNOSIS — J449 Chronic obstructive pulmonary disease, unspecified: Secondary | ICD-10-CM

## 2016-10-28 DIAGNOSIS — G4733 Obstructive sleep apnea (adult) (pediatric): Secondary | ICD-10-CM

## 2016-10-28 DIAGNOSIS — R918 Other nonspecific abnormal finding of lung field: Secondary | ICD-10-CM | POA: Diagnosis not present

## 2016-10-28 MED ORDER — VARENICLINE TARTRATE 0.5 MG X 11 & 1 MG X 42 PO MISC: ORAL | 0 refills | Status: DC

## 2016-10-28 NOTE — Patient Instructions (Signed)
--  Will start chantix starter pack. First set a quit date, start the chantix one week before.   --Quitting smoking is the most important thing that you can do for your health.  --Quitting smoking will have greater affect on your health than any medicine that we can give you.

## 2016-10-28 NOTE — Addendum Note (Signed)
Addended by: Oscar La R on: 10/28/2016 11:16 AM   Modules accepted: Orders

## 2016-11-14 ENCOUNTER — Telehealth: Payer: Self-pay | Admitting: Internal Medicine

## 2016-11-14 NOTE — Telephone Encounter (Signed)
Patient is changing insurances 11/28/16 and she is having to change o2 companies to [employer] in [place] and they need a O2 order. Please call patient.

## 2016-11-14 NOTE — Telephone Encounter (Signed)
Will route to [facility] triage box as this is not a sick message.

## 2016-11-15 ENCOUNTER — Encounter: Payer: Self-pay | Admitting: Internal Medicine

## 2016-11-15 ENCOUNTER — Ambulatory Visit (INDEPENDENT_AMBULATORY_CARE_PROVIDER_SITE_OTHER): Payer: Medicare Other | Admitting: Internal Medicine

## 2016-11-15 VITALS — BP 130/74 | HR 89 | Ht 65.0 in | Wt 171.0 lb

## 2016-11-15 DIAGNOSIS — J9611 Chronic respiratory failure with hypoxia: Secondary | ICD-10-CM | POA: Diagnosis not present

## 2016-11-15 DIAGNOSIS — R918 Other nonspecific abnormal finding of lung field: Secondary | ICD-10-CM | POA: Diagnosis not present

## 2016-11-15 DIAGNOSIS — Z720 Tobacco use: Secondary | ICD-10-CM

## 2016-11-15 DIAGNOSIS — J438 Other emphysema: Secondary | ICD-10-CM | POA: Diagnosis not present

## 2016-11-15 NOTE — Patient Instructions (Addendum)
Continue using 2L of oxygen with activity and 2L of oxygen at night.

## 2016-11-15 NOTE — Telephone Encounter (Signed)
Pt scheduled for 11-15-16 @ 430 for re qualifying walk and face to face per [facility] medical. Pt aware & voiced understanding. Nothing further needed.

## 2016-11-15 NOTE — Progress Notes (Signed)
[facility] Consultation      Assessment and Plan:  COPD/emphysema, with chronic hypoxic respiratory failure. -on Breo 100 to be used as once daily. -Discussed trying to increase her activity level due to presence of debility/deconditioning. -Continue oxygen at 2 L with activity.  Lung nodules. -Reviewed, most recent CT of the chest images from 04/29/2015, lung nodules appear stable, since 02/02/2015 -CT chest 10/25/16--no change in size of nodules, completed about 18 months of surveillance, and no further surveillance would be recommended for these nodules.   Nicotine abuse. -Discussed the importance of smoking cessation, she does feel that she is ready to quit at this time.  -We'll give her prescription for Chantix, she is already set a quit date of her birthday on [birth year], asked her to start the Chantix 1 week before and then stop smoking. Her quit date.  Sleep apnea.  -Continue with CPAP at 11, oxygen via CPAP.   Date: 11/15/2016  MRN# [id number] EMERY DILELLA Jun 03, 1941  Referring Physician:   DANEIL LIGHT is a 75 y.o. old female seen in consultation for chief complaint of:    Chief Complaint  Patient presents with  . Follow-up    per [facility]- pt needs qualifying walk and face to face.    HPI:   The patient is a 75 year old female with a history of COPD, obstructive sleep apnea, hypertension, GERD. She had a CT of the chest on 04/29/2015 which showed bilateral emphysema and multiple pulmonary nodules, the largest of which was 7 mm.  Currently, she is using Brio once daily, and appears to do well with this.  Pt comes in for oxygen Requalification today. The patient was noted to have normal, and saturation 90% at rest, with ambulation her oxygen saturation dropped to 86%, once adding 2L oxygen improved to 93%. She continues to use oxygen at 2L at night with cpap.    At her last visit it was noted that the lung nodules were stable, it was  recommended that she undergo a final scan around December of this year to complete 18 months of surveillance.   She is taking Breo every day, she got a new puppy, does not sleep in the bedroom, the dog helps her keep moving.  She is still having trouble with ADLs such as carrying groceries. She is smoking half to 1 ppd. She has quit in the past with chantix.   She is using her CPAP every night, with oxygen. She is supposed to be on ambulatory oxygen but does not have it with her today.   Review of testing/summary: CT chest 08/25/16 reviewed, no significant change from previous.   Previous testing: CT chest 02/02/2016:Stable sub-cm bilateral pulmonary nodules shows bilateral emphysematous changes in both lungs, CT chest 08/04/2015: Minimally changed, pulmonary nodules, the largest of which was 7 mm. No significant change when compared with previous CT film from 04/29/2015.  CPAP titration study 09/15/2015: Requires CPAP at a pressure of 11. Oxygen desaturations were noted. Sleep study 08/11/2015: Mild sleep apnea with an apnea-hypopnea index of 14.  Medication:       Allergies:  Contrast media [iodinated diagnostic agents]; Other; Statins; and Bacitracin  Review of Systems: Gen:  Denies  fever, sweats, chills HEENT: Denies blurred vision, double vision. . Cvc:  No dizziness, chest pain. Resp:   Denies cough or sputum production. Gi: Denies swallowing difficulty, stomach pain. Gu:  Denies bladder incontinence, burning urine Ext:   No Joint pain, stiffness. Skin: No  skin rash,  hives  Endoc:  No polyuria, polydipsia. Psych: No depression, insomnia. Other:  All other systems were reviewed with the patient and were negative other that what is mentioned in the HPI.   Physical Examination:   VS: BP 130/74 (BP Location: Left Arm, Cuff Size: Normal)   Pulse 89   Ht 5\' 5"  (1.651 m)   Wt 77.6 kg (171 lb)   LMP  (Approximate)   SpO2 98%   BMI 28.46 kg/m   General Appearance: No  distress  Neuro:without focal findings,  speech normal,  HEENT: PERRLA, EOM intact.   Pulmonary: decreased air entry bilaterally.   CardiovascularNormal S1,S2.  No m/r/g.   Abdomen: Benign, Soft, non-tender. Renal:  No costovertebral tenderness  GU:  No performed at this time. Endoc: No evident thyromegaly, no signs of acromegaly. Skin:   warm, no rashes, no ecchymosis  Extremities: normal, no cyanosis, clubbing.  Other findings:    LABORATORY PANEL:   CBC No results for input(s): WBC, HGB, HCT, PLT in the last 168 hours. ------------------------------------------------------------------------------------------------------------------  Chemistries  No results for input(s): NA, K, CL, CO2, GLUCOSE, BUN, CREATININE, CALCIUM, MG, AST, ALT, ALKPHOS, BILITOT in the last 168 hours.  Invalid input(s): GFRCGP ------------------------------------------------------------------------------------------------------------------  Cardiac Enzymes No results for input(s): TROPONINI in the last 168 hours. ------------------------------------------------------------  RADIOLOGY:  No results found.     Thank  you for the consultation and for allowing [facility] to assist in the care of your patient. Our recommendations are noted above.  Please contact us if we can be of further service.   Marda Stalker, MD.  Board Certified in Internal Medicine, Pulmonary Medicine, [place], and Sleep Medicine.  [facility] Office Number: [phone]  Patricia Pesa, M.D.  Vilinda Boehringer, M.D.  Merton Border, M.D  11/15/2016

## 2016-11-15 NOTE — Telephone Encounter (Signed)
Lmtcb. Will await call back 

## 2016-12-13 DIAGNOSIS — J449 Chronic obstructive pulmonary disease, unspecified: Secondary | ICD-10-CM | POA: Diagnosis not present

## 2017-01-12 ENCOUNTER — Other Ambulatory Visit: Payer: Self-pay | Admitting: Family Medicine

## 2017-01-12 ENCOUNTER — Ambulatory Visit
Admission: RE | Admit: 2017-01-12 | Discharge: 2017-01-12 | Disposition: A | Discharge status: Home / Self Care | Payer: Medicare Other | Source: Ambulatory Visit | Attending: Family Medicine | Admitting: Family Medicine

## 2017-01-12 ENCOUNTER — Ambulatory Visit (INDEPENDENT_AMBULATORY_CARE_PROVIDER_SITE_OTHER): Payer: Medicare Other | Admitting: Family Medicine

## 2017-01-12 ENCOUNTER — Encounter: Payer: Self-pay | Admitting: Family Medicine

## 2017-01-12 VITALS — BP 132/82 | HR 82 | Temp 98.2°F | Resp 16 | Wt 165.6 lb

## 2017-01-12 DIAGNOSIS — M545 Low back pain, unspecified: Secondary | ICD-10-CM

## 2017-01-12 DIAGNOSIS — M5136 Other intervertebral disc degeneration, lumbar region: Secondary | ICD-10-CM | POA: Diagnosis not present

## 2017-01-12 LAB — POCT URINALYSIS DIPSTICK
Bilirubin, UA: NEGATIVE
Blood, UA: NEGATIVE
GLUCOSE UA: NEGATIVE
Ketones, UA: NEGATIVE
LEUKOCYTES UA: NEGATIVE
Nitrite, UA: NEGATIVE
Protein, UA: NEGATIVE
SPEC GRAV UA: 1.02
UROBILINOGEN UA: 0.2
pH, UA: 6

## 2017-01-12 NOTE — Progress Notes (Signed)
See recommendations on x-ray result note.

## 2017-01-12 NOTE — Progress Notes (Signed)
Patient: Crystal White Female    DOB: [birth year]   76 y.o.   MRN: [id number] Visit Date: 01/12/2017  Today's Provider: Vernie Murders, PA   Chief Complaint  Patient presents with  . Back Pain   Subjective:    Back Pain  This is a new problem. The current episode started more than 1 month ago. The problem occurs constantly. The problem has been gradually worsening since onset. The pain is present in the lumbar spine. Quality: sharp. Radiates to: upper back. Exacerbated by: movement. Treatments tried: Tylenol. The treatment provided mild relief.   Past Medical History:  Diagnosis Date  . Arthritis    hands, lower back  . Barrett's esophagus   . COPD (chronic obstructive pulmonary disease) ([place])   . Emphysema lung ([place])   . GERD (gastroesophageal reflux disease)   . Hyperlipidemia   . Hypertension   . Mobitz type II atrioventricular block   . OSA (obstructive sleep apnea)    on CPAP  . Peripheral neuropathy ([place])   . Presence of permanent cardiac pacemaker 09/22/11   Biotronik - EVIADR-T [facility])  . Shortness of breath dyspnea   . Stomach ulcer    Patient Active Problem List   Diagnosis Date Noted  . Incisional hernia, without obstruction or gangrene 06/03/2016  . Osteoporosis 02/22/2016  . Musculoskeletal chest pain 07/14/2015  . Chronic respiratory failure ([place]) 07/14/2015  . Acute bronchitis 07/14/2015  . Other emphysema ([place]) 07/14/2015  . Chronic renal insufficiency 07/14/2015  . Chest pain 07/13/2015  . Multiple pulmonary nodules 06/02/2015  . Colon polyp 05/29/2015  . Hemoptysis 04/29/2015  . Sprain of ankle 04/14/2015  . Anxiety 04/14/2015  . Barrett's esophagus 04/14/2015  . Acute exacerbation of chronic obstructive airways disease ([place]) 04/14/2015  . Bursitis 04/14/2015  . Cellulitis 04/14/2015  . Claudication ([place]) 04/14/2015  . Cough 04/14/2015  . Deficiency, 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  . Clinical depression 04/14/2015  . Dizziness  04/14/2015  . Dyslipidemia 04/14/2015  . Essential (primary) hypertension 04/14/2015  . Flank pain 04/14/2015  . H/O suicide attempt 04/14/2015  . Dysphonia 04/14/2015  . Hypercholesteremia 04/14/2015  . Cannot sleep 04/14/2015  . Malaise and fatigue 04/14/2015  . Mild cognitive impairment 04/14/2015  . Cramp in muscle 04/14/2015  . Muscle ache 04/14/2015  . Neuropathy ([place]) 04/14/2015  . Adiposity 04/14/2015  . 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 Candida infection of mouth 04/14/2015  . Abnormal loss of weight 04/14/2015  . Decreased body weight 04/14/2015  . Artificial cardiac pacemaker 10/08/2014  . Apnea, sleep 10/08/2014  . COPD (chronic obstructive pulmonary disease) ([place]) 02/13/2012  . Tobacco abuse 02/13/2012  . Sinus congestion 02/13/2012  . Current tobacco use 02/13/2012  . Esophagitis, reflux 08/28/2006  . Abdominal pain, right lower quadrant 08/22/2005  . Acute appendicitis with peritoneal abscess 08/22/2005   Past Surgical History:  Procedure Laterality Date  . APPENDECTOMY  2006   Dr. Pat Patrick  . BROW LIFT Bilateral 05/03/2016   Procedure: BLEPHAROPLASTY  BILATERAL UPPER EYELIDS WITH EXCESS SKIN REMOVAL BILATERAL BROW PTOSIS REPAIR BLEPHAROPTOSIS REPAIR BILATERAL EYES ;  Surgeon: Karle Starch, MD;  Location: [facility];  Service: Ophthalmology;  Laterality: Bilateral;  . CARDIAC CATHETERIZATION  2007  . COLON SURGERY    . COLONOSCOPY WITH PROPOFOL N/A 04/14/2015   Procedure: COLONOSCOPY WITH PROPOFOL;  Surgeon: Lollie Sails, MD;  Location: [facility] ENDOSCOPY;  Service: Endoscopy;  Laterality: N/A;  . ESOPHAGOGASTRODUODENOSCOPY N/A 04/14/2015   Procedure: ESOPHAGOGASTRODUODENOSCOPY (EGD);  Surgeon:  Lollie Sails, MD;  Location: [facility] ENDOSCOPY;  Service: Endoscopy;  Laterality: N/A;  . HIATAL HERNIA REPAIR  2007  . LAPAROSCOPIC RIGHT HEMI COLECTOMY Right 06/22/2015   Procedure: LAPAROSCOPIC RIGHT HEMI COLECTOMY;  Surgeon: Marlyce Huge, MD;  Location: [facility]  ORS;  Service: General;  Laterality: Right;  . NISSEN FUNDOPLICATION  [id number]  . PACEMAKER INSERTION  09/22/11   [facility]  - Biotronik EVIADR-T  . TOTAL ABDOMINAL HYSTERECTOMY  1980   Family History  Problem Relation Age of Onset  . Adopted: Yes  . Family history unknown: Yes   Allergies  Allergen Reactions  . Contrast Media [Iodinated Diagnostic Agents] Other (See Comments) and Rash    Pain and burning Reaction:  Burning and redness   . Other Rash    Pain and burning  . Statins Other (See Comments)    Reaction:  Leg cramps   . Bacitracin Swelling and Rash     Previous Medications   ALBUTEROL (PROVENTIL HFA) 108 (90 BASE) MCG/ACT INHALER    Inhale 2 puffs into the lungs every 6 (six) hours as needed for wheezing or shortness of breath.   ALENDRONATE (FOSAMAX) 70 MG TABLET    Take 1 tablet (70 mg total) by mouth once a week.   ALPRAZOLAM (XANAX) 0.25 MG TABLET    Take 1 tablet (0.25 mg total) by mouth 2 (two) times daily.   CITALOPRAM (CELEXA) 10 MG TABLET    Take 1 tablet (10 mg total) by mouth at bedtime.   DIAZEPAM (VALIUM) 2 MG TABLET    Take 1 tablet (2 mg total) by mouth 3 (three) times daily as needed for anxiety.   FLUTICASONE FUROATE-VILANTEROL (BREO ELLIPTA) 100-25 MCG/INH AEPB    Inhale 1 puff into the lungs daily.   GABAPENTIN (NEURONTIN) 100 MG CAPSULE    Take 2 capsules (200 mg total) by mouth 3 (three) times daily.   HYDROCHLOROTHIAZIDE (HYDRODIURIL) 25 MG TABLET    Take 1 tablet (25 mg total) by mouth daily.   HYOSCYAMINE (LEVBID) 0.375 MG 12 HR TABLET    Take 1 tablet (0.375 mg total) by mouth 2 (two) times daily as needed for cramping. Reported on 05/03/2016   LOSARTAN (COZAAR) 50 MG TABLET    Take 1 tablet (50 mg total) by mouth at bedtime.   MECLIZINE (ANTIVERT) 25 MG TABLET    Take 1 tablet (25 mg total) by mouth 2 (two) times daily as needed for dizziness.   MIRTAZAPINE (REMERON) 30 MG TABLET    Take 1 tablet (30 mg total) by mouth at bedtime.   OXYCODONE-ACETAMINOPHEN  (PERCOCET) 5-325 MG TABLET    Take 1 tablet by mouth every 4 (four) hours as needed for severe pain.   OXYGEN    Inhale into the lungs daily. Uses at night with CPAP and PRN during daytime   PANTOPRAZOLE (PROTONIX) 40 MG TABLET    Take 1 tablet (40 mg total) by mouth 2 (two) times daily.   TRAZODONE (DESYREL) 150 MG TABLET    Take 1 tablet (150 mg total) by mouth at bedtime.   VARENICLINE (CHANTIX STARTING MONTH PAK) 0.5 MG X 11 & 1 MG X 42 TABLET    Take one 0.5 mg tablet by mouth once daily for 3 days, then increase to one 0.5 mg tablet twice daily for 4 days, then increase to one 1 mg tablet twice daily.    Review of Systems  Constitutional: Negative.   Respiratory: Negative.   Cardiovascular: Negative.  Musculoskeletal: Positive for back pain.    Social History  Substance Use Topics  . Smoking status: Current Every Day Smoker    Packs/day: 1.00    Years: 55.00    Types: Cigarettes  . Smokeless tobacco: Never Used  . Alcohol use 0.0 oz/week     Comment: occcasionall 1-2  glass wine a month   Objective:   BP 132/82 (BP Location: Right Arm, Patient Position: Sitting, Cuff Size: Normal)   Pulse 82   Temp 98.2 F (36.8 C) (Oral)   Resp 16   Wt 165 lb 9.6 oz (75.1 kg)   SpO2 93%   BMI 27.56 kg/m   Physical Exam  Constitutional: She is oriented to person, place, and time. She appears well-developed and well-nourished. No distress.  HENT:  Head: Normocephalic and atraumatic.  Right Ear: Hearing normal.  Left Ear: Hearing normal.  Nose: Nose normal.  Eyes: Conjunctivae and lids are normal. Right eye exhibits no discharge. Left eye exhibits no discharge. No scleral icterus.  Pulmonary/Chest: Effort normal. No respiratory distress.  Musculoskeletal:  No tenderness to palpation today. Sharp pains with transition from sitting to standing or standing to sitting. Constant ache/throbbing at rest (standing, sitting or lying down). Relief from lying on the right side. SLR's 80 degrees  without pain.   Neurological: She is alert and oriented to person, place, and time.  DTR's symmetric knee jerk.  Skin: Skin is intact. No lesion and no rash noted.  Psychiatric: She has a normal mood and affect. Her speech is normal and behavior is normal. Thought content normal.      Assessment & Plan:      1. Midline low back pain without sciatica, unspecified chronicity Onset over the past couple month without known injury. History of osteoporosis and presently on Fosamax 70 mg weekly. Has not been using anything other than Tylenol for pain. May use moist heat and will get L-S spine x-ray to rule out compression fracture or spondylolisthesis. May need Meloxicam or increase in Neurontin. Recheck pending x-ray report. Urinalysis negative for infection. - POCT Urinalysis Dipstick - DG Lumbar Spine Complete

## 2017-01-12 NOTE — Patient Instructions (Signed)
Chronic Back Pain When back pain lasts longer than 3 months, it is called chronic back pain.The cause of your back pain may not be known. Some common causes include:  Wear and tear (degenerative disease) of the bones, ligaments, or disks in your back.  Inflammation and stiffness in your back (arthritis). People who have chronic back pain often go through certain periods in which the pain is more intense (flare-ups). Many people can learn to manage the pain with home care. Follow these instructions at home: Pay attention to any changes in your symptoms. Take these actions to help with your pain: Activity   Avoid bending and activities that make the problem worse.  Do not sit or stand in one place for long periods of time.  Take brief periods of rest throughout the day. This will reduce your pain. Resting in a lying or standing position is usually better than sitting to rest.  When you are resting for longer periods, mix in some mild activity or stretching between periods of rest. This will help to prevent stiffness and pain.  Get regular exercise. Ask your health care provider what activities are safe for you.  Do not lift anything that is heavier than 10 lb (4.5 kg). Always use proper lifting technique, which includes:  Bending your knees.  Keeping the load close to your body.  Avoiding twisting. Managing pain   If directed, apply ice to the painful area. Your health care provider may recommend applying ice during the first 24-48 hours after a flare-up begins.  Put ice in a plastic bag.  Place a towel between your skin and the bag.  Leave the ice on for 20 minutes, 2-3 times per day.  After icing, apply heat to the affected area as often as told by your health care provider. Use the heat source that your health care provider recommends, such as a moist heat pack or a heating pad.  Place a towel between your skin and the heat source.  Leave the heat on for 20-30  minutes.  Remove the heat if your skin turns bright red. This is especially important if you are unable to feel pain, heat, or cold. You may have a greater risk of getting burned.  Try soaking in a warm tub.  Take over-the-counter and prescription medicines only as told by your health care provider.  Keep all follow-up visits as told by your health care provider. This is important. Contact a health care provider if:  You have pain that is not relieved with rest or medicine. Get help right away if:  You have weakness or numbness in one or both of your legs or feet.  You have trouble controlling your bladder or your bowels.  You have nausea or vomiting.  You have pain in your abdomen.  You have shortness of breath or you faint. This information is not intended to replace advice given to you by your health care provider. Make sure you discuss any questions you have with your health care provider. Document Released: 12/22/2004 Document Revised: 03/24/2016 Document Reviewed: 05/04/2015 Elsevier Interactive Patient Education  2017 [employer]  

## 2017-01-13 ENCOUNTER — Telehealth: Payer: Self-pay

## 2017-01-13 DIAGNOSIS — J449 Chronic obstructive pulmonary disease, unspecified: Secondary | ICD-10-CM | POA: Diagnosis not present

## 2017-01-13 NOTE — Telephone Encounter (Signed)
-----   Message from Margo Common, [place] sent at 01/12/2017  1:44 PM EST ----- Only degenerative changes (arthritis) on x-rays. No sign of collapsed vertebra/compression fracture. Can try Aspercreme with Lidocaine (OTC) to rub into the pain area. Prefer to postpone use of NSAID with elevated creatinine on the last blood tests and limit possibility of irritating stomach. Recheck progress in 2 weeks.

## 2017-01-13 NOTE — Telephone Encounter (Signed)
Patient has been advised, she states that she has a follow up appt with Dr. Rosanna Randy on 2/28 and will address at visit. KW

## 2017-01-24 ENCOUNTER — Ambulatory Visit: Payer: Self-pay | Admitting: Family Medicine

## 2017-01-24 ENCOUNTER — Ambulatory Visit (INDEPENDENT_AMBULATORY_CARE_PROVIDER_SITE_OTHER): Payer: Medicare Other | Admitting: Family Medicine

## 2017-01-24 VITALS — BP 132/66 | HR 88 | Temp 98.4°F | Resp 18 | Wt 163.0 lb

## 2017-01-24 DIAGNOSIS — F419 Anxiety disorder, unspecified: Secondary | ICD-10-CM | POA: Diagnosis not present

## 2017-01-24 DIAGNOSIS — I10 Essential (primary) hypertension: Secondary | ICD-10-CM

## 2017-01-24 DIAGNOSIS — K2100 Gastro-esophageal reflux disease with esophagitis, without bleeding: Secondary | ICD-10-CM

## 2017-01-24 DIAGNOSIS — R739 Hyperglycemia, unspecified: Secondary | ICD-10-CM

## 2017-01-24 DIAGNOSIS — Z720 Tobacco use: Secondary | ICD-10-CM

## 2017-01-24 DIAGNOSIS — J449 Chronic obstructive pulmonary disease, unspecified: Secondary | ICD-10-CM | POA: Diagnosis not present

## 2017-01-24 DIAGNOSIS — M545 Low back pain, unspecified: Secondary | ICD-10-CM

## 2017-01-24 DIAGNOSIS — E7800 Pure hypercholesterolemia, unspecified: Secondary | ICD-10-CM | POA: Diagnosis not present

## 2017-01-24 DIAGNOSIS — Z789 Other specified health status: Secondary | ICD-10-CM | POA: Diagnosis not present

## 2017-01-24 LAB — POCT GLYCOSYLATED HEMOGLOBIN (HGB A1C): HEMOGLOBIN A1C: 5.5

## 2017-01-24 MED ORDER — EZETIMIBE 10 MG PO TABS: 10.0000 mg | ORAL_TABLET | Freq: Every day | ORAL | 12 refills | Status: DC

## 2017-01-24 MED ORDER — EZETIMIBE 10 MG PO TABS: 10.0000 mg | ORAL_TABLET | Freq: Every day | ORAL | 3 refills | Status: DC

## 2017-01-24 NOTE — Progress Notes (Signed)
Crystal White  MRN: [id number] DOB: Apr 19, 1941  Subjective:  HPI  Patient is here for 6 months follow up. COPD: She is using Breo inhaler and has albuterol inhaler as needed. Sees pulmonologist. Has chronic symptoms of SOB, cough and wheezing. She is still smoking. No cardiac symptoms. She does have numbness and tingling in feet and legs. She is taking Celexa daily and Xanax maybe 2 tablets a month. All labs were done in August 2017. She is taking Protonix 1 tablet twice daily. Patient Active Problem List   Diagnosis Date Noted  . Incisional hernia, without obstruction or gangrene 06/03/2016  . Osteoporosis 02/22/2016  . Musculoskeletal chest pain 07/14/2015  . Chronic respiratory failure ([place]) 07/14/2015  . Acute bronchitis 07/14/2015  . Other emphysema ([place]) 07/14/2015  . Chronic renal insufficiency 07/14/2015  . Chest pain 07/13/2015  . Multiple pulmonary nodules 06/02/2015  . Colon polyp 05/29/2015  . Hemoptysis 04/29/2015  . Sprain of ankle 04/14/2015  . Anxiety 04/14/2015  . Barrett's esophagus 04/14/2015  . Acute exacerbation of chronic obstructive airways disease ([place]) 04/14/2015  . Bursitis 04/14/2015  . Cellulitis 04/14/2015  . Claudication ([place]) 04/14/2015  . Cough 04/14/2015  . Deficiency, 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  . Clinical depression 04/14/2015  . Dizziness 04/14/2015  . Dyslipidemia 04/14/2015  . Essential (primary) hypertension 04/14/2015  . Flank pain 04/14/2015  . H/O suicide attempt 04/14/2015  . Dysphonia 04/14/2015  . Hypercholesteremia 04/14/2015  . Cannot sleep 04/14/2015  . Malaise and fatigue 04/14/2015  . Mild cognitive impairment 04/14/2015  . Cramp in muscle 04/14/2015  . Muscle ache 04/14/2015  . Neuropathy ([place]) 04/14/2015  . Adiposity 04/14/2015  . 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 Candida infection of mouth 04/14/2015  . Abnormal loss of weight 04/14/2015  . Decreased body weight 04/14/2015  . Artificial cardiac  pacemaker 10/08/2014  . Apnea, sleep 10/08/2014  . COPD (chronic obstructive pulmonary disease) ([place]) 02/13/2012  . Tobacco abuse 02/13/2012  . Sinus congestion 02/13/2012  . Current tobacco use 02/13/2012  . Esophagitis, reflux 08/28/2006  . Abdominal pain, right lower quadrant 08/22/2005  . Acute appendicitis with peritoneal abscess 08/22/2005    Past Medical History:  Diagnosis Date  . Arthritis    hands, lower back  . Barrett's esophagus   . COPD (chronic obstructive pulmonary disease) ([place])   . Emphysema lung ([place])   . GERD (gastroesophageal reflux disease)   . Hyperlipidemia   . Hypertension   . Mobitz type II atrioventricular block   . OSA (obstructive sleep apnea)    on CPAP  . Peripheral neuropathy ([place])   . Presence of permanent cardiac pacemaker 09/22/11   Biotronik - EVIADR-T [facility])  . Shortness of breath dyspnea   . Stomach ulcer     Social History   Social History  . Marital status: Widowed    Spouse name: widowed  . Number of children: Y  . Years of education: N/A   Occupational History  . retired Personal assistant    Social History Main Topics  . Smoking status: Current Every Day Smoker    Packs/day: 1.00    Years: 55.00    Types: Cigarettes  . Smokeless tobacco: Never Used  . Alcohol use 0.0 oz/week     Comment: occcasionall 1-2  glass wine a month  . Drug use: No  . Sexual activity: No   Other Topics Concern  . Not on file   Social History Narrative   Pt was adopted.  Lives at home by herself.   Not on home oxygen.    Outpatient Encounter Prescriptions as of 01/24/2017  Medication Sig Note  . albuterol (PROVENTIL HFA) 108 (90 Base) MCG/ACT inhaler Inhale 2 puffs into the lungs every 6 (six) hours as needed for wheezing or shortness of breath.   Marland Kitchen alendronate (FOSAMAX) 70 MG tablet Take 1 tablet (70 mg total) by mouth once a week.   . ALPRAZolam (XANAX) 0.25 MG tablet Take 1 tablet (0.25 mg total) by mouth 2 (two) times daily.  01/24/2017: As needed  . citalopram (CELEXA) 10 MG tablet Take 1 tablet (10 mg total) by mouth at bedtime.   . diazepam (VALIUM) 2 MG tablet Take 1 tablet (2 mg total) by mouth 3 (three) times daily as needed for anxiety. 01/24/2017: As needed  . fluticasone furoate-vilanterol (BREO ELLIPTA) 100-25 MCG/INH AEPB Inhale 1 puff into the lungs daily.   Marland Kitchen gabapentin (NEURONTIN) 100 MG capsule Take 2 capsules (200 mg total) by mouth 3 (three) times daily.   . hydrochlorothiazide (HYDRODIURIL) 25 MG tablet Take 1 tablet (25 mg total) by mouth daily.   . hyoscyamine (LEVBID) 0.375 MG 12 hr tablet Take 1 tablet (0.375 mg total) by mouth 2 (two) times daily as needed for cramping. Reported on 05/03/2016   . losartan (COZAAR) 50 MG tablet Take 1 tablet (50 mg total) by mouth at bedtime.   . meclizine (ANTIVERT) 25 MG tablet Take 1 tablet (25 mg total) by mouth 2 (two) times daily as needed for dizziness. 01/24/2017: As needed  . mirtazapine (REMERON) 30 MG tablet Take 1 tablet (30 mg total) by mouth at bedtime.   . OXYGEN Inhale into the lungs daily. Uses at night with CPAP and PRN during daytime   . pantoprazole (PROTONIX) 40 MG tablet Take 1 tablet (40 mg total) by mouth 2 (two) times daily.   . traZODone (DESYREL) 150 MG tablet Take 1 tablet (150 mg total) by mouth at bedtime.   . [DISCONTINUED] oxyCODONE-acetaminophen (PERCOCET) 5-325 MG tablet Take 1 tablet by mouth every 4 (four) hours as needed for severe pain. (Patient not taking: Reported on 01/12/2017)   . [DISCONTINUED] varenicline (CHANTIX STARTING MONTH PAK) 0.5 MG X 11 & 1 MG X 42 tablet Take one 0.5 mg tablet by mouth once daily for 3 days, then increase to one 0.5 mg tablet twice daily for 4 days, then increase to one 1 mg tablet twice daily. (Patient not taking: Reported on 01/12/2017)    No facility-administered encounter medications on file as of 01/24/2017.     Allergies  Allergen Reactions  . Contrast Media [Iodinated Diagnostic Agents] Other  (See Comments) and Rash    Pain and burning Reaction:  Burning and redness   . Other Rash    Pain and burning  . Statins Other (See Comments)    Reaction:  Leg cramps   . Bacitracin Swelling and Rash    Review of Systems  Constitutional: Positive for malaise/fatigue.  Respiratory: Positive for cough, sputum production, shortness of breath and wheezing.   Cardiovascular: Negative.   Gastrointestinal: Negative.        Controlled, on medication  Musculoskeletal: Positive for back pain.  Skin: Negative.   Neurological: Positive for tingling (and numbness in feet and legs) and weakness.  Endo/Heme/Allergies: Negative.   Psychiatric/Behavioral: Positive for depression. The patient is nervous/anxious and has insomnia.        Stable on medications.    Objective:  BP 132/66  Pulse 88   Temp 98.4 F (36.9 C)   Resp 18   Wt 163 lb (73.9 kg)   SpO2 91%   BMI 27.12 kg/m   Physical Exam  Constitutional: She is oriented to person, place, and time and well-developed, well-nourished, and in no distress.  HENT:  Head: Normocephalic and atraumatic.  Right Ear: External ear normal.  Left Ear: External ear normal.  Eyes: Conjunctivae are normal. Pupils are equal, round, and reactive to light.  Neck: Normal range of motion. Neck supple.  Cardiovascular: Normal rate, regular rhythm, normal heart sounds and intact distal pulses.   No murmur heard. Pulmonary/Chest: Effort normal and breath sounds normal. No respiratory distress. She has no wheezes.  Abdominal: Soft. There is no tenderness. There is no rebound.  Small hernia above the umbilicus, about an inch or so  Neurological: She is alert and oriented to person, place, and time.  Psychiatric: Mood, memory, affect and judgment normal.    Assessment and Plan :  1. Essential (primary) hypertension Stable. Continue current medication.  2. Midline low back pain without sciatica, unspecified chronicity Chronic. Patient advised to try and  walk and move around as much as she is able to tolerate. Patient advised to limit Tylenol to 2 tablets at one time. 3. Hypercholesteremia Start Zetia. Patient is statin intolerant. Re check levels on the next visit. 4. Current tobacco use Patient strongly advised to quit.  5. Anxiety Stable.  6. Chronic obstructive pulmonary disease, unspecified COPD type ([place])  7. Esophagitis, reflux Stable on current regimen.  8. Statin intolerance 9. Hyperglycemia A1C 5.5. Resolved at this time. - POCT HgB A1C 10. Small umbilical hernia Patient states this does not bother her enough to see a surgeon for at this time. Follow for now.  HPI, Exam and A&P transcribed under direction and in the presence of [name], MD.

## 2017-01-25 ENCOUNTER — Ambulatory Visit: Payer: Medicare Other | Admitting: Family Medicine

## 2017-01-25 ENCOUNTER — Ambulatory Visit: Payer: Medicare Other

## 2017-02-06 DIAGNOSIS — L218 Other seborrheic dermatitis: Secondary | ICD-10-CM | POA: Diagnosis not present

## 2017-02-06 DIAGNOSIS — L720 Epidermal cyst: Secondary | ICD-10-CM | POA: Diagnosis not present

## 2017-02-06 DIAGNOSIS — D239 Other benign neoplasm of skin, unspecified: Secondary | ICD-10-CM | POA: Diagnosis not present

## 2017-02-06 DIAGNOSIS — L570 Actinic keratosis: Secondary | ICD-10-CM | POA: Diagnosis not present

## 2017-02-06 DIAGNOSIS — L821 Other seborrheic keratosis: Secondary | ICD-10-CM | POA: Diagnosis not present

## 2017-02-10 DIAGNOSIS — J449 Chronic obstructive pulmonary disease, unspecified: Secondary | ICD-10-CM | POA: Diagnosis not present

## 2017-02-20 DIAGNOSIS — M79675 Pain in left toe(s): Secondary | ICD-10-CM | POA: Diagnosis not present

## 2017-02-20 DIAGNOSIS — I739 Peripheral vascular disease, unspecified: Secondary | ICD-10-CM | POA: Diagnosis not present

## 2017-02-20 DIAGNOSIS — B351 Tinea unguium: Secondary | ICD-10-CM | POA: Diagnosis not present

## 2017-02-20 DIAGNOSIS — M79674 Pain in right toe(s): Secondary | ICD-10-CM | POA: Diagnosis not present

## 2017-02-22 ENCOUNTER — Telehealth: Payer: Self-pay | Admitting: Family Medicine

## 2017-03-13 DIAGNOSIS — J449 Chronic obstructive pulmonary disease, unspecified: Secondary | ICD-10-CM | POA: Diagnosis not present

## 2017-03-27 ENCOUNTER — Ambulatory Visit: Payer: Medicare Other | Admitting: Family Medicine

## 2017-03-28 ENCOUNTER — Ambulatory Visit (INDEPENDENT_AMBULATORY_CARE_PROVIDER_SITE_OTHER): Payer: Medicare Other

## 2017-03-28 ENCOUNTER — Ambulatory Visit (INDEPENDENT_AMBULATORY_CARE_PROVIDER_SITE_OTHER): Payer: Medicare Other | Admitting: Family Medicine

## 2017-03-28 VITALS — BP 138/72 | HR 80 | Temp 98.8°F | Ht 65.0 in | Wt 168.0 lb

## 2017-03-28 VITALS — BP 138/72 | HR 80 | Temp 98.8°F | Wt 168.0 lb

## 2017-03-28 DIAGNOSIS — Z720 Tobacco use: Secondary | ICD-10-CM

## 2017-03-28 DIAGNOSIS — Z0000 Encounter for general adult medical examination without abnormal findings: Secondary | ICD-10-CM | POA: Diagnosis not present

## 2017-03-28 DIAGNOSIS — I10 Essential (primary) hypertension: Secondary | ICD-10-CM

## 2017-03-28 DIAGNOSIS — E7800 Pure hypercholesterolemia, unspecified: Secondary | ICD-10-CM

## 2017-03-28 NOTE — Progress Notes (Signed)
Subjective:   Crystal White is a 76 y.o. female who presents for Medicare Annual (Subsequent) preventive examination.  Review of Systems:  N/A  Cardiac Risk Factors include: advanced age (>54men, >35 women);dyslipidemia;hypertension;smoking/ tobacco exposure     Objective:     Vitals: BP 138/72 (BP Location: Right Arm)   Pulse 80   Temp 98.8 F (37.1 C) (Oral)   Ht 5\' 5"  (1.651 m)   Wt 168 lb (76.2 kg)   BMI 27.96 kg/m   Body mass index is 27.96 kg/m.   Tobacco History  Smoking Status  . Current Every Day Smoker  . Packs/day: 1.00  . Years: 55.00  . Types: Cigarettes  Smokeless Tobacco  . Never Used     Ready to quit: No Counseling given: No   Past Medical History:  Diagnosis Date  . Arthritis    hands, lower back  . Barrett's esophagus   . COPD (chronic obstructive pulmonary disease) ([place])   . Emphysema lung ([place])   . GERD (gastroesophageal reflux disease)   . Hyperlipidemia   . Hypertension   . Mobitz type II atrioventricular block   . OSA (obstructive sleep apnea)    on CPAP  . Peripheral neuropathy   . Presence of permanent cardiac pacemaker 09/22/11   Biotronik - EVIADR-T [facility])  . Shortness of breath dyspnea   . Stomach ulcer    Past Surgical History:  Procedure Laterality Date  . APPENDECTOMY  2006   Dr. Pat Patrick  . BROW LIFT Bilateral 05/03/2016   Procedure: BLEPHAROPLASTY  BILATERAL UPPER EYELIDS WITH EXCESS SKIN REMOVAL BILATERAL BROW PTOSIS REPAIR BLEPHAROPTOSIS REPAIR BILATERAL EYES ;  Surgeon: Karle Starch, MD;  Location: [place] [place];  Service: Ophthalmology;  Laterality: Bilateral;  . CARDIAC CATHETERIZATION  2007  . COLON SURGERY    . COLONOSCOPY WITH PROPOFOL N/A 04/14/2015   Procedure: COLONOSCOPY WITH PROPOFOL;  Surgeon: Lollie Sails, MD;  Location[facility] END[facility]COPY;  Service: Endoscopy;  Laterality: N/A;  . ESOPHAGOGASTRODUODENOSCOPY N/A 04/14/2015   Procedure: ESOPHAGOGASTRODUODENOSCOPY (EGD);  Surgeon: Lollie Sails, MD;  Location: [facility] ENDOSCOPY;  Service: Endoscopy;  Laterality: N/A;  . HIATAL HERNIA REPAIR  2007  . LAPAROSCOPIC RIGHT HEMI COLECTOMY Right 06/22/2015   Procedure: LAPAROSCOPIC RIGHT HEMI COLECTOMY;  Surgeon: Marlyce Huge, MD;  Location: [facility] ORS;  Service: General;  Laterality: Right;  . NISSEN FUNDOPLICATION  1610  . PACEMAKER INSERTION  09/22/11   [facility]  - Biotronik EVIADR-T  . TOTAL ABDOMINAL HYSTERECTOMY  1980   Family History  Problem Relation Age of Onset  . Adopted: Yes  . Family history unknown: Yes   History  Sexual Activity  . Sexual activity: No    Outpatient Encounter Prescriptions as of 03/28/2017  Medication Sig  . acetaminophen (TYLENOL) 500 MG tablet Take 500 mg by mouth every 6 (six) hours as needed.  Marland Kitchen albuterol (PROVENTIL HFA) 108 (90 Base) MCG/ACT inhaler Inhale 2 puffs into the lungs every 6 (six) hours as needed for wheezing or shortness of breath.  Marland Kitchen alendronate (FOSAMAX) 70 MG tablet Take 1 tablet (70 mg total) by mouth once a week.  . ALPRAZolam (XANAX) 0.25 MG tablet Take 1 tablet (0.25 mg total) by mouth 2 (two) times daily. (Patient taking differently: Take 0.25 mg by mouth 2 (two) times daily. )  . citalopram (CELEXA) 10 MG tablet Take 1 tablet (10 mg total) by mouth at bedtime.  . fluticasone furoate-vilanterol (BREO ELLIPTA) 100-25 MCG/INH AEPB Inhale 1 puff  into the lungs daily.  Marland Kitchen gabapentin (NEURONTIN) 100 MG capsule Take 2 capsules (200 mg total) by mouth 3 (three) times daily.  . hydrochlorothiazide (HYDRODIURIL) 25 MG tablet Take 1 tablet (25 mg total) by mouth daily.  . hyoscyamine (LEVBID) 0.375 MG 12 hr tablet Take 1 tablet (0.375 mg total) by mouth 2 (two) times daily as needed for cramping. Reported on 05/03/2016  . losartan (COZAAR) 50 MG tablet Take 1 tablet (50 mg total) by mouth at bedtime.  . meclizine (ANTIVERT) 25 MG tablet Take 1 tablet (25 mg total) by mouth 2 (two) times daily as needed for dizziness.  . mirtazapine  (REMERON) 30 MG tablet Take 1 tablet (30 mg total) by mouth at bedtime.  . OXYGEN Inhale into the lungs daily. Uses at night with CPAP and PRN during daytime  . pantoprazole (PROTONIX) 40 MG tablet Take 1 tablet (40 mg total) by mouth 2 (two) times daily.  . traZODone (DESYREL) 150 MG tablet Take 1 tablet (150 mg total) by mouth at bedtime.  . diazepam (VALIUM) 2 MG tablet Take 1 tablet (2 mg total) by mouth 3 (three) times daily as needed for anxiety. (Patient not taking: Reported on 03/28/2017)  . ezetimibe (ZETIA) 10 MG tablet Take 1 tablet (10 mg total) by mouth daily.   No facility-administered encounter medications on file as of 03/28/2017.     Activities of Daily Living In your present state of health, do you have any difficulty performing the following activities: 03/28/2017 05/03/2016  Hearing? N N  Vision? N N  Difficulty concentrating or making decisions? N N  Walking or climbing stairs? Y N  Dressing or bathing? N N  Doing errands, shopping? N -  Preparing Food and eating ? N -  Using the Toilet? N -  In the past six months, have you accidently leaked urine? Y -  Do you have problems with loss of bowel control? Y -  Managing your Medications? N -  Managing your Finances? N -  Housekeeping or managing your Housekeeping? N -  Some recent data might be hidden    Patient Care Team: Jerrol Banana., MD as PCP - General (Unknown Physician Specialty)    Assessment:     Exercise Activities and Dietary recommendations Current Exercise Habits: The patient does not participate in regular exercise at present, Exercise limited by: None identified  Goals    . Cut out night time snacks          Recommend cutting out all snacks after 7 PM at night.       Fall Risk Fall Risk  03/28/2017 02/22/2016 10/19/2015 08/10/2015  Falls in the past year? No No No No   Depression Screen PHQ 2/9 Scores 03/28/2017 03/28/2017 02/22/2016 10/19/2015  PHQ - 2 Score 0 0 0 0  PHQ- 9 Score 9 - - -       Cognitive Function        Immunization History  Administered Date(s) Administered  . Influenza Whole 08/28/2012  . Influenza, High Dose Seasonal PF 08/10/2015, 07/25/2016  . Influenza-Unspecified 08/28/2013  . Pneumococcal Conjugate-13 08/28/2014  . Pneumococcal Polysaccharide-23 03/07/2011  . Tdap 08/28/2014  . Zoster 11/04/2012   Screening Tests Health Maintenance  Topic Date Due  . MAMMOGRAM  02/11/2011  . INFLUENZA VACCINE  06/28/2017  . TETANUS/TDAP  08/28/2024  . COLONOSCOPY  04/13/2025  . DEXA SCAN  Completed  . PNA vac Low Risk Adult  Completed  Plan:  I have personally reviewed and addressed the Medicare Annual Wellness questionnaire and have noted the following in the patient's chart:  A. Medical and social history B. Use of alcohol, tobacco or illicit drugs  C. Current medications and supplements D. Functional ability and status E.  Nutritional status F.  Physical activity G. Advance directives H. List of other physicians I.  Hospitalizations, surgeries, and ER visits in previous 12 months J.  [place] such as hearing and vision if needed, cognitive and depression L. Referrals and appointments - none  In addition, I have reviewed and discussed with patient certain preventive protocols, quality metrics, and best practice recommendations. A written personalized care plan for preventive services as well as general preventive health recommendations were provided to patient.  See attached scanned questionnaire for additional information.   Signed,  Fabio Neighbors, LPN Nurse Health Advisor   MD Recommendations: None. Pt to schedule mammogram this year (2018). I have reviewed the health advisors note, was  available for consultation and I agree with documentation and plan. [name] MD [facility]

## 2017-03-28 NOTE — Patient Instructions (Addendum)
PLEASE MAKE SURE YOU ARE TAKING THIS MEDICATION Ezetimibe(ZETIA). IF YOU ARE TAKING THIS MEDICATION THEN GET YOUR BLOOD WORK CHECKED FASTING.

## 2017-03-28 NOTE — Progress Notes (Signed)
Crystal White  MRN: [id number] DOB: 09-22-1941  Subjective:  HPI  Patient is here to follow up on cholesterol. Last office visit was on 01/24/17 and levels were discussed with patient and she was started on Zetia. Patient was to follow up today and have levels re checked on the medication. She is not sure if she is taking this medication. Lab Results  Component Value Date   CHOL 292 (H) 07/25/2016   HDL 45 07/25/2016   LDLCALC 185 (H) 07/25/2016   TRIG 312 (H) 07/25/2016    Patient Active Problem List   Diagnosis Date Noted  . Incisional hernia, without obstruction or gangrene 06/03/2016  . Osteoporosis 02/22/2016  . Musculoskeletal chest pain 07/14/2015  . Chronic respiratory failure ([place]) 07/14/2015  . Acute bronchitis 07/14/2015  . Other emphysema ([place]) 07/14/2015  . Chronic renal insufficiency 07/14/2015  . Chest pain 07/13/2015  . Multiple pulmonary nodules 06/02/2015  . Colon polyp 05/29/2015  . Hemoptysis 04/29/2015  . Sprain of ankle 04/14/2015  . Anxiety 04/14/2015  . Barrett's esophagus 04/14/2015  . Acute exacerbation of chronic obstructive airways disease ([place]) 04/14/2015  . Bursitis 04/14/2015  . Cellulitis 04/14/2015  . Claudication ([place]) 04/14/2015  . Cough 04/14/2015  . Deficiency, 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  . Clinical depression 04/14/2015  . Dizziness 04/14/2015  . Dyslipidemia 04/14/2015  . Essential (primary) hypertension 04/14/2015  . Flank pain 04/14/2015  . H/O suicide attempt 04/14/2015  . Dysphonia 04/14/2015  . Hypercholesteremia 04/14/2015  . Cannot sleep 04/14/2015  . Malaise and fatigue 04/14/2015  . Mild cognitive impairment 04/14/2015  . Cramp in muscle 04/14/2015  . Muscle ache 04/14/2015  . Neuropathy 04/14/2015  . Adiposity 04/14/2015  . 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 Candida infection of mouth 04/14/2015  . Abnormal loss of weight 04/14/2015  . Decreased body weight 04/14/2015  . Artificial cardiac pacemaker  10/08/2014  . Apnea, sleep 10/08/2014  . COPD (chronic obstructive pulmonary disease) ([place]) 02/13/2012  . Tobacco abuse 02/13/2012  . Sinus congestion 02/13/2012  . Current tobacco use 02/13/2012  . Esophagitis, reflux 08/28/2006  . Abdominal pain, right lower quadrant 08/22/2005  . Acute appendicitis with peritoneal abscess 08/22/2005    Past Medical History:  Diagnosis Date  . Arthritis    hands, lower back  . Barrett's esophagus   . COPD (chronic obstructive pulmonary disease) ([place])   . Emphysema lung ([place])   . GERD (gastroesophageal reflux disease)   . Hyperlipidemia   . Hypertension   . Mobitz type II atrioventricular block   . OSA (obstructive sleep apnea)    on CPAP  . Peripheral neuropathy   . Presence of permanent cardiac pacemaker 09/22/11   [place] - EV[facility]ADR-[facility])  . Shortness of breath dyspnea   . Stomach ulcer     Social History   Social History  . Marital status: Widowed    Spouse name: widowed  . Number of children: Y  . Years of education: N/A   Occupational History  . retired Personal assistant    Social History Main Topics  . Smoking status: Current Every Day Smoker    Packs/day: 1.00    Years: 55.00    Types: Cigarettes  . Smokeless tobacco: Never Used  . Alcohol use 0.0 oz/week     Comment: occcasionally 1-2  glass wine a month  . Drug use: No  . Sexual activity: No   Other Topics Concern  . Not on file   Social History Narrative  Pt was adopted.    Lives at home by herself.   Not on home oxygen.    Outpatient Encounter Prescriptions as of 03/28/2017  Medication Sig Note  . acetaminophen (TYLENOL) 500 MG tablet Take 500 mg by mouth every 6 (six) hours as needed.   Marland Kitchen albuterol (PROVENTIL HFA) 108 (90 Base) MCG/ACT inhaler Inhale 2 puffs into the lungs every 6 (six) hours as needed for wheezing or shortness of breath.   Marland Kitchen alendronate (FOSAMAX) 70 MG tablet Take 1 tablet (70 mg total) by mouth once a week.   . ALPRAZolam (XANAX)  0.25 MG tablet Take 1 tablet (0.25 mg total) by mouth 2 (two) times daily. (Patient taking differently: Take 0.25 mg by mouth 2 (two) times daily. ) 01/24/2017: As needed  . citalopram (CELEXA) 10 MG tablet Take 1 tablet (10 mg total) by mouth at bedtime.   Marland Kitchen ezetimibe (ZETIA) 10 MG tablet Take 1 tablet (10 mg total) by mouth daily.   . fluticasone furoate-vilanterol (BREO ELLIPTA) 100-25 MCG/INH AEPB Inhale 1 puff into the lungs daily.   Marland Kitchen gabapentin (NEURONTIN) 100 MG capsule Take 2 capsules (200 mg total) by mouth 3 (three) times daily.   . hydrochlorothiazide (HYDRODIURIL) 25 MG tablet Take 1 tablet (25 mg total) by mouth daily.   . hyoscyamine (LEVBID) 0.375 MG 12 hr tablet Take 1 tablet (0.375 mg total) by mouth 2 (two) times daily as needed for cramping. Reported on 05/03/2016   . losartan (COZAAR) 50 MG tablet Take 1 tablet (50 mg total) by mouth at bedtime.   . meclizine (ANTIVERT) 25 MG tablet Take 1 tablet (25 mg total) by mouth 2 (two) times daily as needed for dizziness. 01/24/2017: As needed  . mirtazapine (REMERON) 30 MG tablet Take 1 tablet (30 mg total) by mouth at bedtime.   . OXYGEN Inhale into the lungs daily. Uses at night with CPAP and PRN during daytime   . pantoprazole (PROTONIX) 40 MG tablet Take 1 tablet (40 mg total) by mouth 2 (two) times daily.   . traZODone (DESYREL) 150 MG tablet Take 1 tablet (150 mg total) by mouth at bedtime.   . diazepam (VALIUM) 2 MG tablet Take 1 tablet (2 mg total) by mouth 3 (three) times daily as needed for anxiety. (Patient not taking: Reported on 03/28/2017) 01/24/2017: As needed   No facility-administered encounter medications on file as of 03/28/2017.     Allergies  Allergen Reactions  . Contrast Media [Iodinated Diagnostic Agents] Other (See Comments) and Rash    Pain and burning Reaction:  Burning and redness   . Other Rash    Pain and burning  . Statins Other (See Comments)    Reaction:  Leg cramps   . Bacitracin Swelling and Rash     Review of Systems  Constitutional: Positive for malaise/fatigue.  HENT: Positive for sinus pain and tinnitus.   Eyes: Positive for discharge.  Respiratory: Positive for cough and shortness of breath.        Apnea  Cardiovascular: Negative.   Gastrointestinal: Positive for diarrhea.  Genitourinary: Positive for frequency.  Musculoskeletal: Positive for back pain and joint pain.  Neurological: Positive for dizziness.  Endo/Heme/Allergies: Bruises/bleeds easily.  Psychiatric/Behavioral: Negative.        Sleep disturbance    Objective:  BP 138/72   Pulse 80   Temp 98.8 F (37.1 C)   Wt 168 lb (76.2 kg)   BMI 27.96 kg/m   Physical Exam  Constitutional: She is oriented  to person, place, and time and well-developed, well-nourished, and in no distress.  HENT:  Head: Normocephalic and atraumatic.  Right Ear: External ear normal.  Left Ear: External ear normal.  Nose: Nose normal.  Eyes: Conjunctivae are normal. Pupils are equal, round, and reactive to light.  Neck: Normal range of motion. Neck supple.  Cardiovascular: Normal rate, regular rhythm, normal heart sounds and intact distal pulses.   No murmur heard. Pulmonary/Chest: Effort normal and breath sounds normal. No respiratory distress. She has no wheezes.  Abdominal: Soft.  Neurological: She is alert and oriented to person, place, and time.  Skin: Skin is warm and dry.  Psychiatric: Mood, memory, affect and judgment normal.    Assessment and Plan :  1. Hypercholesteremia Will re check levels today if patient is taking Zetia. Follow. - Lipid Panel With LDL/HDL Ratio  2. Essential (primary) hypertension 3. Current tobacco use Patient advised to quit. 4.Chronic GERD HPI, Exam and A&P transcribed by Theressa Millard, RMA under direction and in the presence of [name], MD.

## 2017-03-28 NOTE — Patient Instructions (Addendum)
Crystal White , Thank you for taking time to come for your Medicare Wellness Visit. I appreciate your ongoing commitment to your health goals. Please review the following plan we discussed and let me know if I can assist you in the future.   Screening recommendations/referrals: Colonoscopy: completed 06/09/15, due 05/2020 Mammogram: completed 03/29/11, patient to schedule this year (2018) Bone Density: completed 03/10/16, due 02/2026 Recommended yearly ophthalmology/optometry visit for glaucoma screening and checkup Recommended yearly dental visit for hygiene and checkup  Vaccinations: Influenza vaccine: up to date, due 07/2017 Pneumococcal vaccine: completed series Tdap vaccine: completed 08/28/14, due 08/2024 Shingles vaccine: completed 11/04/12    Advanced directives: Please bring a copy of your POA (Power of [place]) and/or Living Will to your next appointment.   Conditions/risks identified: Recommend cutting out all snacks after 7 PM.  Next appointment: None, need to schedule AWV in 1 year.   Preventive Care 75 Years and Older, Female Preventive care refers to lifestyle choices and visits with your health care provider that can promote health and wellness. What does preventive care include?  A yearly physical exam. This is also called an annual well check.  Dental exams once or twice a year.  Routine eye exams. Ask your health care provider how often you should have your eyes checked.  Personal lifestyle choices, including:  Daily care of your teeth and gums.  Regular physical activity.  Eating a healthy diet.  Avoiding tobacco and drug use.  Limiting alcohol use.  Practicing safe sex.  Taking low-dose aspirin every day.  Taking vitamin and mineral supplements as recommended by your health care provider. What happens during an annual well check? The services and screenings done by your health care provider during your annual well check will depend on your age,  overall health, lifestyle risk factors, and family history of disease. Counseling  Your health care provider may ask you questions about your:  Alcohol use.  Tobacco use.  Drug use.  Emotional well-being.  Home and relationship well-being.  Sexual activity.  Eating habits.  History of falls.  Memory and ability to understand (cognition).  Work and work Statistician.  Reproductive health. Screening  You may have the following tests or measurements:  Height, weight, and BMI.  Blood pressure.  Lipid and cholesterol levels. These may be checked every 5 years, or more frequently if you are over 75 years old.  Skin check.  Lung cancer screening. You may have this screening every year starting at age 75 if you have a 30-pack-year history of smoking and currently smoke or have quit within the past 15 years.  Fecal occult blood test (FOBT) of the stool. You may have this test every year starting at age 75.  Flexible sigmoidoscopy or colonoscopy. You may have a sigmoidoscopy every 5 years or a colonoscopy every 10 years starting at age 75.  Hepatitis C blood test.  Hepatitis B blood test.  Sexually transmitted disease (STD) testing.  Diabetes screening. This is done by checking your blood sugar (glucose) after you have not eaten for a while (fasting). You may have this done every 1-3 years.  Bone density scan. This is done to screen for osteoporosis. You may have this done starting at age 75.  Mammogram. This may be done every 1-2 years. Talk to your health care provider about how often you should have regular mammograms. Talk with your health care provider about your test results, treatment options, and if necessary, the need for more tests. Vaccines  Your health care provider may recommend certain vaccines, such as:  Influenza vaccine. This is recommended every year.  Tetanus, diphtheria, and acellular pertussis (Tdap, Td) vaccine. You may need a Td booster every 10  years.  Zoster vaccine. You may need this after age 65.  Pneumococcal 13-valent conjugate (PCV13) vaccine. One dose is recommended after age 75.  Pneumococcal polysaccharide (PPSV23) vaccine. One dose is recommended after age 75. Talk to your health care provider about which screenings and vaccines you need and how often you need them. This information is not intended to replace advice given to you by your health care provider. Make sure you discuss any questions you have with your health care provider. Document Released: 12/11/2015 Document Revised: 08/03/2016 Document Reviewed: 09/15/2015 Elsevier Interactive Patient Education  2017 [place] Prevention in the Home Falls can cause injuries. They can happen to people of all ages. There are many things you can do to make your home safe and to help prevent falls. What can I do on the outside of my home?  Regularly fix the edges of walkways and driveways and fix any cracks.  Remove anything that might make you trip as you walk through a door, such as a raised step or threshold.  Trim any bushes or trees on the path to your home.  Use bright outdoor lighting.  Clear any walking paths of anything that might make someone trip, such as rocks or tools.  Regularly check to see if handrails are loose or broken. Make sure that both sides of any steps have handrails.  Any raised decks and porches should have guardrails on the edges.  Have any leaves, snow, or ice cleared regularly.  Use sand or salt on walking paths during winter.  Clean up any spills in your garage right away. This includes oil or grease spills. What can I do in the bathroom?  Use night lights.  Install grab bars by the toilet and in the tub and shower. Do not use towel bars as grab bars.  Use non-skid mats or decals in the tub or shower.  If you need to sit down in the shower, use a plastic, non-slip stool.  Keep the floor dry. Clean up any water that  spills on the floor as soon as it happens.  Remove soap buildup in the tub or shower regularly.  Attach bath mats securely with double-sided non-slip rug tape.  Do not have throw rugs and other things on the floor that can make you trip. What can I do in the bedroom?  Use night lights.  Make sure that you have a light by your bed that is easy to reach.  Do not use any sheets or blankets that are too big for your bed. They should not hang down onto the floor.  Have a firm chair that has side arms. You can use this for support while you get dressed.  Do not have throw rugs and other things on the floor that can make you trip. What can I do in the kitchen?  Clean up any spills right away.  Avoid walking on wet floors.  Keep items that you use a lot in easy-to-reach places.  If you need to reach something above you, use a strong step stool that has a grab bar.  Keep electrical cords out of the way.  Do not use floor polish or wax that makes floors slippery. If you must use wax, use non-skid floor wax.  Do  not have throw rugs and other things on the floor that can make you trip. What can I do with my stairs?  Do not leave any items on the stairs.  Make sure that there are handrails on both sides of the stairs and use them. Fix handrails that are broken or loose. Make sure that handrails are as long as the stairways.  Check any carpeting to make sure that it is firmly attached to the stairs. Fix any carpet that is loose or worn.  Avoid having throw rugs at the top or bottom of the stairs. If you do have throw rugs, attach them to the floor with carpet tape.  Make sure that you have a light switch at the top of the stairs and the bottom of the stairs. If you do not have them, ask someone to add them for you. What else can I do to help prevent falls?  Wear shoes that:  Do not have high heels.  Have rubber bottoms.  Are comfortable and fit you well.  Are closed at the  toe. Do not wear sandals.  If you use a stepladder:  Make sure that it is fully opened. Do not climb a closed stepladder.  Make sure that both sides of the stepladder are locked into place.  Ask someone to hold it for you, if possible.  Clearly mark and make sure that you can see:  Any grab bars or handrails.  First and last steps.  Where the edge of each step is.  Use tools that help you move around (mobility aids) if they are needed. These include:  Canes.  Walkers.  Scooters.  Crutches.  Turn on the lights when you go into a dark area. Replace any light bulbs as soon as they burn out.  Set up your furniture so you have a clear path. Avoid moving your furniture around.  If any of your floors are uneven, fix them.  If there are any pets around you, be aware of where they are.  Review your medicines with your doctor. Some medicines can make you feel dizzy. This can increase your chance of falling. Ask your doctor what other things that you can do to help prevent falls. This information is not intended to replace advice given to you by your health care provider. Make sure you discuss any questions you have with your health care provider. Document Released: 09/10/2009 Document Revised: 04/21/2016 Document Reviewed: 12/19/2014 Elsevier Interactive Patient Education  2017 [employer].

## 2017-04-12 DIAGNOSIS — J449 Chronic obstructive pulmonary disease, unspecified: Secondary | ICD-10-CM | POA: Diagnosis not present

## 2017-04-26 ENCOUNTER — Ambulatory Visit (INDEPENDENT_AMBULATORY_CARE_PROVIDER_SITE_OTHER): Payer: Medicare Other | Admitting: Family Medicine

## 2017-04-26 ENCOUNTER — Telehealth: Payer: Self-pay

## 2017-04-26 ENCOUNTER — Encounter: Payer: Self-pay | Admitting: Family Medicine

## 2017-04-26 VITALS — BP 140/80 | HR 93 | Temp 98.0°F | Resp 16 | Wt 169.0 lb

## 2017-04-26 DIAGNOSIS — L239 Allergic contact dermatitis, unspecified cause: Secondary | ICD-10-CM | POA: Diagnosis not present

## 2017-04-26 MED ORDER — PREDNISONE 20 MG PO TABS: 20.0000 mg | ORAL_TABLET | Freq: Two times a day (BID) | ORAL | 0 refills | Status: AC

## 2017-04-26 NOTE — Telephone Encounter (Signed)
Spoke with patient and went ahead and made appt to se Dr Caryn Section this afternoon to check the rash-aa

## 2017-04-26 NOTE — Telephone Encounter (Signed)
Patient called saying that she has developed a rash that has spread all over her body. She reports that she has had the rash since yesterday morning. She reports that it is really itchy, red, and raised above the skin. She has been taking Benadryl with no relief.   She denies any fever, changes in soaps or detergents, or recent changes in medications. She has not been exposed to any poison oak or ivy that she is aware of. She does mention that she had a tick bite about 3 weeks ago, but does not feel her rash is in relation to this.  You do not have any openings today. Can patient wait to be seen tomorrow by you or another provider? Or can you call something in? Patient uses [facility] in [place]. Thanks!

## 2017-04-26 NOTE — Telephone Encounter (Signed)
OTC claritin for itching. I have no openings until mid June. For rash will neeed to see PA.

## 2017-04-26 NOTE — Progress Notes (Signed)
Patient: Crystal White Female    DOB: September 26, 1941   76 y.o.   MRN: [id number] Visit Date: 04/26/2017  Today's Provider: Lelon Huh, MD   No chief complaint on file.  Subjective:    Patient has had rash for 2 days. Rash started around face and has now spread all over her body. Rash is light red and itchy. Patient stated that she has not come in to contact with anything different or unusual. Patient wanted to mention that she did find a tick on her right upper arm at the end of April 2018. Patient has been taking otc benadryl with no relief.    Rash  This is a new problem. The current episode started in the past 7 days (2 days). The problem has been gradually worsening since onset. The affected locations include the head, face, neck, chest, torso, back, abdomen, left arm, left upper leg, left lower leg, left ankle, right arm, right upper leg, right lower leg and right ankle. The rash is characterized by itchiness and redness. She was exposed to nothing. Pertinent negatives include no anorexia, congestion, cough, diarrhea, eye pain, facial edema, fatigue, fever, joint pain, nail changes, rhinorrhea, shortness of breath, sore throat or vomiting. Treatments tried: benadryl. The treatment provided no relief.   Has been taking 2 benadryl every six hours, last was about eight hours . Rash has resolved since she came into air conditioned office. Otherwise feel well with no constitutional symptoms.     Allergies  Allergen Reactions  . Contrast Media [Iodinated Diagnostic Agents] Other (See Comments) and Rash    Pain and burning Reaction:  Burning and redness   . Other Rash    Pain and burning  . Statins Other (See Comments)    Reaction:  Leg cramps   . Bacitracin Swelling and Rash     Current Outpatient Prescriptions:  .  acetaminophen (TYLENOL) 500 MG tablet, Take 500 mg by mouth every 6 (six) hours as needed., Disp: , Rfl:  .  albuterol (PROVENTIL HFA) 108 (90 Base) MCG/ACT  inhaler, Inhale 2 puffs into the lungs every 6 (six) hours as needed for wheezing or shortness of breath., Disp: 3 each, Rfl: 3 .  alendronate (FOSAMAX) 70 MG tablet, Take 1 tablet (70 mg total) by mouth once a week., Disp: 12 tablet, Rfl: 3 .  ALPRAZolam (XANAX) 0.25 MG tablet, Take 1 tablet (0.25 mg total) by mouth 2 (two) times daily. (Patient taking differently: Take 0.25 mg by mouth 2 (two) times daily. ), Disp: 60 tablet, Rfl: 0 .  citalopram (CELEXA) 10 MG tablet, Take 1 tablet (10 mg total) by mouth at bedtime., Disp: 90 tablet, Rfl: 3 .  diazepam (VALIUM) 2 MG tablet, Take 1 tablet (2 mg total) by mouth 3 (three) times daily as needed for anxiety. (Patient not taking: Reported on 03/28/2017), Disp: 90 tablet, Rfl: 0 .  ezetimibe (ZETIA) 10 MG tablet, Take 1 tablet (10 mg total) by mouth daily., Disp: 90 tablet, Rfl: 3 .  fluticasone furoate-vilanterol (BREO ELLIPTA) 100-25 MCG/INH AEPB, Inhale 1 puff into the lungs daily., Disp: 180 each, Rfl: 3 .  gabapentin (NEURONTIN) 100 MG capsule, Take 2 capsules (200 mg total) by mouth 3 (three) times daily., Disp: 540 capsule, Rfl: 3 .  hydrochlorothiazide (HYDRODIURIL) 25 MG tablet, Take 1 tablet (25 mg total) by mouth daily., Disp: 90 tablet, Rfl: 3 .  hyoscyamine (LEVBID) 0.375 MG 12 hr tablet, Take 1 tablet (0.375 mg total)  by mouth 2 (two) times daily as needed for cramping. Reported on 05/03/2016, Disp: 180 tablet, Rfl: 3 .  losartan (COZAAR) 50 MG tablet, Take 1 tablet (50 mg total) by mouth at bedtime., Disp: 90 tablet, Rfl: 3 .  meclizine (ANTIVERT) 25 MG tablet, Take 1 tablet (25 mg total) by mouth 2 (two) times daily as needed for dizziness., Disp: 180 tablet, Rfl: 3 .  mirtazapine (REMERON) 30 MG tablet, Take 1 tablet (30 mg total) by mouth at bedtime., Disp: 90 tablet, Rfl: 3 .  OXYGEN, Inhale into the lungs daily. Uses at night with CPAP and PRN during daytime, Disp: , Rfl:  .  pantoprazole (PROTONIX) 40 MG tablet, Take 1 tablet (40 mg total)  by mouth 2 (two) times daily., Disp: 180 tablet, Rfl: 3 .  traZODone (DESYREL) 150 MG tablet, Take 1 tablet (150 mg total) by mouth at bedtime., Disp: 90 tablet, Rfl: 3  Review of Systems  Constitutional: Negative for appetite change, chills, fatigue and fever.  HENT: Negative for congestion, rhinorrhea and sore throat.   Eyes: Negative for pain.  Respiratory: Negative for cough, chest tightness and shortness of breath.   Cardiovascular: Negative for chest pain and palpitations.  Gastrointestinal: Negative for abdominal pain, anorexia, diarrhea, nausea and vomiting.  Musculoskeletal: Negative for joint pain.  Skin: Positive for rash. Negative for nail changes.  Neurological: Negative for dizziness and weakness.    Social History  Substance Use Topics  . Smoking status: Current Every Day Smoker    Packs/day: 1.00    Years: 55.00    Types: Cigarettes  . Smokeless tobacco: Never Used  . Alcohol use 0.0 oz/week     Comment: occcasionally 1-2  glass wine a month   Objective:   There were no vitals taken for this visit. Vitals:   04/26/17 1650  BP: 140/80  Pulse: 93  Resp: 16  Temp: 98 F (36.7 C)  TempSrc: Oral  SpO2: 94%  Weight: 169 lb (76.7 kg)     Physical Exam   General Appearance:    Alert, cooperative, no distress  Eyes:    PERRL, conjunctiva/corneas clear, EOM's intact       Lungs:     Clear to auscultation bilaterally, respirations unlabored  Heart:    Regular rate and rhythm  Skin:   No lesions or rashes.          Assessment & Plan:     1. Allergic dermatitis Resolved since coming into air conditioned building and has been on OTC Benadryl. Will cover with 4 days prednisone. No sign of tick born illness.  - predniSONE (DELTASONE) 20 MG tablet; Take 1 tablet (20 mg total) by mouth 2 (two) times daily with a meal.  Dispense: 10 tablet; Refill: 0       Lelon Huh, MD  [facility]

## 2017-05-04 DIAGNOSIS — I739 Peripheral vascular disease, unspecified: Secondary | ICD-10-CM | POA: Diagnosis not present

## 2017-05-04 DIAGNOSIS — I442 Atrioventricular block, complete: Secondary | ICD-10-CM | POA: Diagnosis not present

## 2017-05-04 DIAGNOSIS — E785 Hyperlipidemia, unspecified: Secondary | ICD-10-CM | POA: Diagnosis not present

## 2017-05-04 DIAGNOSIS — Z950 Presence of cardiac pacemaker: Secondary | ICD-10-CM | POA: Diagnosis not present

## 2017-05-04 DIAGNOSIS — I10 Essential (primary) hypertension: Secondary | ICD-10-CM | POA: Diagnosis not present

## 2017-05-04 DIAGNOSIS — J449 Chronic obstructive pulmonary disease, unspecified: Secondary | ICD-10-CM | POA: Diagnosis not present

## 2017-05-13 DIAGNOSIS — J449 Chronic obstructive pulmonary disease, unspecified: Secondary | ICD-10-CM | POA: Diagnosis not present

## 2017-06-12 DIAGNOSIS — J449 Chronic obstructive pulmonary disease, unspecified: Secondary | ICD-10-CM | POA: Diagnosis not present

## 2017-06-28 ENCOUNTER — Other Ambulatory Visit: Payer: Self-pay | Admitting: Gastroenterology

## 2017-06-28 DIAGNOSIS — K22719 Barrett's esophagus with dysplasia, unspecified: Secondary | ICD-10-CM

## 2017-06-28 DIAGNOSIS — R1013 Epigastric pain: Secondary | ICD-10-CM

## 2017-06-28 DIAGNOSIS — K921 Melena: Secondary | ICD-10-CM

## 2017-07-03 ENCOUNTER — Ambulatory Visit
Admission: RE | Admit: 2017-07-03 | Discharge: 2017-07-03 | Disposition: A | Discharge status: Home / Self Care | Payer: Medicare Other | Source: Ambulatory Visit | Attending: Gastroenterology | Admitting: Gastroenterology

## 2017-07-03 DIAGNOSIS — R1013 Epigastric pain: Secondary | ICD-10-CM

## 2017-07-03 DIAGNOSIS — K225 Diverticulum of esophagus, acquired: Secondary | ICD-10-CM | POA: Insufficient documentation

## 2017-07-03 DIAGNOSIS — K22719 Barrett's esophagus with dysplasia, unspecified: Secondary | ICD-10-CM | POA: Diagnosis not present

## 2017-07-03 DIAGNOSIS — K921 Melena: Secondary | ICD-10-CM

## 2017-07-03 DIAGNOSIS — K219 Gastro-esophageal reflux disease without esophagitis: Secondary | ICD-10-CM | POA: Insufficient documentation

## 2017-07-03 DIAGNOSIS — K2270 Barrett's esophagus without dysplasia: Secondary | ICD-10-CM | POA: Insufficient documentation

## 2017-07-05 ENCOUNTER — Encounter: Payer: Self-pay | Admitting: *Deleted

## 2017-07-05 ENCOUNTER — Encounter
Admission: RE | Disposition: A | Discharge status: Home / Self Care | Payer: Self-pay | Source: Ambulatory Visit | Attending: Unknown Physician Specialty

## 2017-07-05 ENCOUNTER — Ambulatory Visit
Admission: RE | Admit: 2017-07-05 | Discharge: 2017-07-05 | Disposition: A | Discharge status: Home / Self Care | Payer: Medicare Other | Source: Ambulatory Visit | Attending: Unknown Physician Specialty | Admitting: Unknown Physician Specialty

## 2017-07-05 ENCOUNTER — Ambulatory Visit: Payer: Medicare Other | Admitting: Anesthesiology

## 2017-07-05 DIAGNOSIS — K210 Gastro-esophageal reflux disease with esophagitis: Secondary | ICD-10-CM | POA: Diagnosis not present

## 2017-07-05 DIAGNOSIS — K219 Gastro-esophageal reflux disease without esophagitis: Secondary | ICD-10-CM | POA: Diagnosis not present

## 2017-07-05 DIAGNOSIS — K3189 Other diseases of stomach and duodenum: Secondary | ICD-10-CM | POA: Diagnosis not present

## 2017-07-05 DIAGNOSIS — J439 Emphysema, unspecified: Secondary | ICD-10-CM | POA: Insufficient documentation

## 2017-07-05 DIAGNOSIS — I10 Essential (primary) hypertension: Secondary | ICD-10-CM | POA: Insufficient documentation

## 2017-07-05 DIAGNOSIS — R1310 Dysphagia, unspecified: Secondary | ICD-10-CM | POA: Diagnosis not present

## 2017-07-05 DIAGNOSIS — Z79899 Other long term (current) drug therapy: Secondary | ICD-10-CM | POA: Diagnosis not present

## 2017-07-05 DIAGNOSIS — E785 Hyperlipidemia, unspecified: Secondary | ICD-10-CM | POA: Diagnosis not present

## 2017-07-05 DIAGNOSIS — K2970 Gastritis, unspecified, without bleeding: Secondary | ICD-10-CM | POA: Insufficient documentation

## 2017-07-05 DIAGNOSIS — K222 Esophageal obstruction: Secondary | ICD-10-CM | POA: Diagnosis not present

## 2017-07-05 DIAGNOSIS — K2960 Other gastritis without bleeding: Secondary | ICD-10-CM | POA: Diagnosis not present

## 2017-07-05 DIAGNOSIS — Z7983 Long term (current) use of bisphosphonates: Secondary | ICD-10-CM | POA: Insufficient documentation

## 2017-07-05 DIAGNOSIS — F419 Anxiety disorder, unspecified: Secondary | ICD-10-CM | POA: Diagnosis not present

## 2017-07-05 DIAGNOSIS — G4733 Obstructive sleep apnea (adult) (pediatric): Secondary | ICD-10-CM | POA: Diagnosis not present

## 2017-07-05 DIAGNOSIS — Z9981 Dependence on supplemental oxygen: Secondary | ICD-10-CM | POA: Diagnosis not present

## 2017-07-05 DIAGNOSIS — F17210 Nicotine dependence, cigarettes, uncomplicated: Secondary | ICD-10-CM | POA: Insufficient documentation

## 2017-07-05 DIAGNOSIS — I441 Atrioventricular block, second degree: Secondary | ICD-10-CM | POA: Insufficient documentation

## 2017-07-05 DIAGNOSIS — K2950 Unspecified chronic gastritis without bleeding: Secondary | ICD-10-CM | POA: Diagnosis not present

## 2017-07-05 DIAGNOSIS — F329 Major depressive disorder, single episode, unspecified: Secondary | ICD-10-CM | POA: Diagnosis not present

## 2017-07-05 DIAGNOSIS — J449 Chronic obstructive pulmonary disease, unspecified: Secondary | ICD-10-CM | POA: Diagnosis not present

## 2017-07-05 DIAGNOSIS — G629 Polyneuropathy, unspecified: Secondary | ICD-10-CM | POA: Insufficient documentation

## 2017-07-05 DIAGNOSIS — Z950 Presence of cardiac pacemaker: Secondary | ICD-10-CM | POA: Diagnosis not present

## 2017-07-05 DIAGNOSIS — Z7951 Long term (current) use of inhaled steroids: Secondary | ICD-10-CM | POA: Insufficient documentation

## 2017-07-05 HISTORY — PX: ESOPHAGOGASTRODUODENOSCOPY (EGD) WITH PROPOFOL: SHX5813

## 2017-07-05 SURGERY — ESOPHAGOGASTRODUODENOSCOPY (EGD) WITH PROPOFOL
Anesthesia: General

## 2017-07-05 MED ORDER — SODIUM CHLORIDE 0.9 % IV SOLN: INTRAVENOUS | Status: DC

## 2017-07-05 MED ORDER — PROPOFOL 10 MG/ML IV BOLUS
INTRAVENOUS | Status: AC
  Filled 2017-07-05: qty 20

## 2017-07-05 MED ORDER — IPRATROPIUM-ALBUTEROL 0.5-2.5 (3) MG/3ML IN SOLN
RESPIRATORY_TRACT | Status: AC
  Filled 2017-07-05: qty 3

## 2017-07-05 MED ORDER — LIDOCAINE HCL (PF) 2 % IJ SOLN
INTRAMUSCULAR | Status: AC
  Filled 2017-07-05: qty 2

## 2017-07-05 NOTE — Op Note (Signed)
[facility] Patient Name: Crystal White Procedure Date: 07/05/2017 2:57 PM MRN: [id number] Account #: [id number] Date of Birth: 1941-04-05 Admit Type: Outpatient Age: 76 Room: [facility] ENDO ROOM 3 Gender: Female Note Status: Finalized Procedure:            Upper GI endoscopy Indications:          Dysphagia, Heartburn Providers:            Manya Silvas, MD Referring MD:         Janine Ores. Rosanna Randy, MD (Referring MD) Medicines:            Propofol per Anesthesia Complications:        No immediate complications. Procedure:            Pre-Anesthesia Assessment:                       - After reviewing the risks and benefits, the patient                        was deemed in satisfactory condition to undergo the                        procedure.                       After obtaining informed consent, the endoscope was                        passed under direct vision. Throughout the procedure,                        the patient's blood pressure, pulse, and oxygen                        saturations were monitored continuously. The Endoscope                        was introduced through the mouth, and advanced to the                        second part of duodenum. The upper GI endoscopy was                        accomplished without difficulty. The patient tolerated                        the procedure well. Findings:      There were esophageal mucosal changes suspicious for short-segment       Barrett's esophagus present in the distal esophagus. The maximum       longitudinal extent of these mucosal changes was 1-2 cm in length. It       was somewhat lopsided with an irregular pattern. Mucosa was biopsied       with a cold forceps for histology. One specimen bottle was sent to       pathology.      Diffuse mild inflammation characterized by congestion (edema) and       erythema was found in the gastric body. Biopsies were taken with a cold       forceps  for histology. Biopsies were taken with a cold forceps for  Helicobacter pylori testing.      The examined duodenum was normal.      After procedure done a guide wire was passed into the duodenum and scope       withdrawn and a 45 F and a 48 F Savary dilators passed, more resistance       to the second dilator. Impression:           - Esophageal mucosal changes suspicious for                        short-segment Barrett's esophagus. Biopsied.                       - Gastritis. Biopsied.                       - Normal examined duodenum.                       dysphagia, Savary dilators Recommendation:       - soft food for 3 days, eat slowly, chew well, take                        small bites Manya Silvas, MD 07/05/2017 3:36:38 PM This report has been signed electronically. Number of Addenda: 0 Note Initiated On: 07/05/2017 2:57 PM      [facility]

## 2017-07-06 ENCOUNTER — Encounter: Payer: Self-pay | Admitting: Anesthesiology

## 2017-07-06 NOTE — Anesthesia Postprocedure Evaluation (Signed)
Anesthesia Post Note  Patient: Crystal White  Procedure(s) Performed: Procedure(s) (LRB): ESOPHAGOGASTRODUODENOSCOPY (EGD) WITH PROPOFOL (N/A)  Patient location during evaluation: Endoscopy Anesthesia Type: General Level of consciousness: awake and alert Pain management: pain level controlled Vital Signs Assessment: post-procedure vital signs reviewed and stable Respiratory status: spontaneous breathing, nonlabored ventilation, respiratory function stable and patient connected to nasal cannula oxygen Cardiovascular status: blood pressure returned to baseline and stable Postop Assessment: no signs of nausea or vomiting Anesthetic complications: no     Last Vitals:  Vitals:   07/05/17 1421  BP: (!) 159/71  Pulse: 90  Resp: 20  Temp: 36.6 C    Last Pain:  Vitals:   07/05/17 1421  TempSrc: Tympanic          EPIC EMR Failure.  See paper chart for further info.        Precious Haws Piscitello

## 2017-07-06 NOTE — Anesthesia Preprocedure Evaluation (Signed)
Anesthesia Evaluation  Patient identified by MRN, date of birth, ID band Patient awake    Reviewed: Allergy & Precautions, H&P , NPO status , Patient's Chart, lab work & pertinent test results  History of Anesthesia Complications Negative for: history of anesthetic complications  Airway Mallampati: III  TM Distance: <3 FB Neck ROM: limited    Dental  (+) Poor Dentition, Chipped, Missing   Pulmonary shortness of breath, with exertion and Long-Term Oxygen Therapy, sleep apnea , COPD, Current Smoker,           Cardiovascular Exercise Tolerance: Poor hypertension, + dysrhythmias + pacemaker      Neuro/Psych PSYCHIATRIC DISORDERS Anxiety Depression  Neuromuscular disease    GI/Hepatic Neg liver ROS, PUD, GERD  ,  Endo/Other  negative endocrine ROS  Renal/GU Renal disease  negative genitourinary   Musculoskeletal  (+) Arthritis ,   Abdominal   Peds  Hematology negative hematology ROS (+)   Anesthesia Other Findings Past Medical History: No date: Arthritis     Comment:  hands, lower back No date: Barrett's esophagus No date: COPD (chronic obstructive pulmonary disease) (HCC) No date: Emphysema lung (HCC) No date: GERD (gastroesophageal reflux disease) No date: Hyperlipidemia No date: Hypertension No date: Mobitz type II atrioventricular block No date: OSA (obstructive sleep apnea)     Comment:  on CPAP No date: Peripheral neuropathy 09/22/11: Presence of permanent cardiac pacemaker     Comment:  Biotronik - EVIADR-T [facility]) No date: Shortness of breath dyspnea No date: Stomach ulcer  Past Surgical History: 2006: APPENDECTOMY     Comment:  Dr. Pat Patrick 05/03/2016: BROW LIFT; Bilateral     Comment:  Procedure: BLEPHAROPLASTY  BILATERAL UPPER EYELIDS WITH               EXCESS SKIN REMOVAL BILATERAL BROW PTOSIS REPAIR               BLEPHAROPTOSIS REPAIR BILATERAL EYES ;  Surgeon: Karle Starch, MD;   Location: [place];  Service:               Ophthalmology;  Laterality: Bilateral; 2007: CARDIAC CATHETERIZATION No date: COLON SURGERY 04/14/2015: COLONOSCOPY WITH PROPOFOL; N/A     Comment:  Procedure: COLONOSCOPY WITH PROPOFOL;  Surgeon: Lollie Sails, MD;  Location: [facility] ENDOSCOPY;  Service:               Endoscopy;  Laterality: N/A; 04/14/2015: ESOPHAGOGASTRODUODENOSCOPY; N/A     Comment:  Procedure: ESOPHAGOGASTRODUODENOSCOPY (EGD);  Surgeon:               Lollie Sails, MD;  Location: [facility] ENDOSCOPY;                Service: Endoscopy;  Laterality: N/A; 2007: HIATAL HERNIA REPAIR 06/22/2015: LAPAROSCOPIC RIGHT HEMI COLECTOMY; Right     Comment:  Procedure: LAPAROSCOPIC RIGHT HEMI COLECTOMY;  Surgeon:               Marlyce Huge, MD;  Location: [facility] ORS;  Service:              General;  Laterality: Right; 5277: NISSEN FUNDOPLICATION 82/42/35: PACEMAKER INSERTION     Comment:  [facility]  - Biotronik EVIADR-T 1980: TOTAL ABDOMINAL HYSTERECTOMY  BMI    Body Mass Index:  28.67 kg/m      Reproductive/Obstetrics  negative OB ROS                             Anesthesia Physical Anesthesia Plan  ASA: IV  Anesthesia Plan: General   Post-op Pain Management:    Induction: Intravenous  PONV Risk Score and Plan:   Airway Management Planned: Natural Airway and Nasal Cannula  Additional Equipment:   Intra-op Plan:   Post-operative Plan:   Informed Consent: I have reviewed the patients History and Physical, chart, labs and discussed the procedure including the risks, benefits and alternatives for the proposed anesthesia with the patient or authorized representative who has indicated his/her understanding and acceptance.   Dental Advisory Given  Plan Discussed with: Anesthesiologist, CRNA and Surgeon  Anesthesia Plan Comments: (Patient consented for risks of anesthesia including but not limited to:  - adverse  reactions to medications - risk of intubation if required - damage to teeth, lips or other oral mucosa - sore throat or hoarseness - Damage to heart, brain, lungs or loss of life  Patient voiced understanding.)        Anesthesia Quick Evaluation

## 2017-07-06 NOTE — Transfer of Care (Signed)
Immediate Anesthesia Transfer of Care Note  Patient: Crystal White  Procedure(s) Performed: Procedure(s): ESOPHAGOGASTRODUODENOSCOPY (EGD) WITH PROPOFOL (N/A)  Patient Location: Endoscopy Unit  Anesthesia Type:General  Level of Consciousness: sedated  Airway & Oxygen Therapy: Patient Spontanous Breathing and Patient connected to nasal cannula oxygen  Post-op Assessment: Report given to RN and Post -op Vital signs reviewed and stable  Post vital signs: Reviewed and stable  Last Vitals:  Vitals:   07/05/17 1421  BP: (!) 159/71  Pulse: 90  Resp: 20  Temp: 36.6 C    Last Pain:  Vitals:   07/05/17 1421  TempSrc: Tympanic     EPIC EMR Failure.  See paper chart for further info. Patients Stated Pain Goal: 0 (14/97/02 6378)  Complications: No apparent anesthesia complications

## 2017-07-06 NOTE — H&P (Signed)
Primary Care Physician:  Jerrol Banana., MD Primary Gastroenterologist:  Dr. Vira Agar  Pre-Procedure History & Physical: HPI:  Crystal White is a 76 y.o. female is here for an endoscopy.   Past Medical History:  Diagnosis Date  . Arthritis    hands, lower back  . Barrett's esophagus   . COPD (chronic obstructive pulmonary disease) ([place])   . Emphysema lung ([place])   . GERD (gastroesophageal reflux disease)   . Hyperlipidemia   . Hypertension   . Mobitz type II atrioventricular block   . OSA (obstructive sleep apnea)    on CPAP  . Peripheral neuropathy   . Presence of permanent cardiac pacemaker 09/22/11   Biotronik - EVIADR-T [facility])  . Shortness of breath dyspnea   . Stomach ulcer     Past Surgical History:  Procedure Laterality Date  . APPENDECTOMY  2006   Dr. Pat Patrick  . BROW LIFT Bilateral 05/03/2016   Procedure: BLEPHAROPLASTY  BILATERAL UPPER EYELIDS WITH EXCESS SKIN REMOVAL BILATERAL BROW PTOSIS REPAIR BLEPHAROPTOSIS REPAIR BILATERAL EYES ;  Surgeon: Karle Starch, MD;  Location: [place];  Service: Ophthalmology;  Laterality: Bilateral;  . CARDIAC CATHETERIZATION  2007  . COLON SURGERY    . COLONOSCOPY WITH PROPOFOL N/A 04/14/2015   Procedure: COLONOSCOPY WITH PROPOFOL;  Surgeon: Lollie Sails, MD;  Location: [facility] ENDOSCOPY;  Service: Endoscopy;  Laterality: N/A;  . ESOPHAGOGASTRODUODENOSCOPY N/A 04/14/2015   Procedure: ESOPHAGOGASTRODUODENOSCOPY (EGD);  Surgeon: Lollie Sails, MD;  Location[facility] ENDOSCOPY;  Service: Endoscopy;  Laterality: N/A;  . ESOPHAGOGASTRODUODENOSCOPY (EGD) WITH PROPOFOL N/A 07/05/2017   Procedure: ESOPHAGOGASTRODUODENOSCOPY (EGD) WITH PROPOFOL;  Surgeon: Manya Silvas, MD;  Location: [facility] ENDOSCOPY;  Service: Endoscopy;  Laterality: N/A;  . HIATAL HERNIA REPAIR  2007  . LAPAROSCOPIC RIGHT HEMI COLECTOMY Right 06/22/2015   Procedure: LAPAROSCOPIC RIGHT HEMI COLECTOMY;  Surgeon: Marlyce Huge, MD;  Location: [facility]  ORS;  Service: General;  Laterality: Right;  . NISSEN FUNDOPLICATION  9326  . PACEMAKER INSERTION  09/22/11   [facility]  - Biotronik EVIADR-T  . TOTAL ABDOMINAL HYSTERECTOMY  1980    Prior to Admission medications   Medication Sig Start Date End Date Taking? Authorizing Provider  acetaminophen (TYLENOL) 500 MG tablet Take 500 mg by mouth every 6 (six) hours as needed.   Yes [provider]  albuterol (PROVENTIL HFA) 108 (90 Base) MCG/ACT inhaler Inhale 2 puffs into the lungs every 6 (six) hours as needed for wheezing or shortness of breath. 09/14/16  Yes Jerrol Banana., MD  alendronate (FOSAMAX) 70 MG tablet Take 1 tablet (70 mg total) by mouth once a week. 09/14/16  Yes Jerrol Banana., MD  citalopram (CELEXA) 10 MG tablet Take 1 tablet (10 mg total) by mouth at bedtime. 09/14/16  Yes Jerrol Banana., MD  fluticasone furoate-vilanterol (BREO ELLIPTA) 100-25 MCG/INH AEPB Inhale 1 puff into the lungs daily. 09/14/16  Yes Jerrol Banana., MD  gabapentin (NEURONTIN) 100 MG capsule Take 2 capsules (200 mg total) by mouth 3 (three) times daily. 09/14/16  Yes Jerrol Banana., MD  hydrochlorothiazide (HYDRODIURIL) 25 MG tablet Take 1 tablet (25 mg total) by mouth daily. 09/14/16  Yes Jerrol Banana., MD  hyoscyamine (LEVBID) 0.375 MG 12 hr tablet Take 1 tablet (0.375 mg total) by mouth 2 (two) times daily as needed for cramping. Reported on 05/03/2016 09/14/16  Yes Jerrol Banana., MD  losartan (COZAAR) 50 MG  tablet Take 1 tablet (50 mg total) by mouth at bedtime. 09/14/16  Yes Jerrol Banana., MD  meclizine (ANTIVERT) 25 MG tablet Take 1 tablet (25 mg total) by mouth 2 (two) times daily as needed for dizziness. 09/14/16  Yes Jerrol Banana., MD  mirtazapine (REMERON) 30 MG tablet Take 1 tablet (30 mg total) by mouth at bedtime. 09/14/16  Yes Jerrol Banana., MD  OXYGEN Inhale into the lungs daily. Uses at night with CPAP and PRN  during daytime   Yes [provider]  pantoprazole (PROTONIX) 40 MG tablet Take 1 tablet (40 mg total) by mouth 2 (two) times daily. 09/14/16  Yes Jerrol Banana., MD  traZODone (DESYREL) 150 MG tablet Take 1 tablet (150 mg total) by mouth at bedtime. 09/14/16  Yes Jerrol Banana., MD  ALPRAZolam Duanne Moron) 0.25 MG tablet Take 1 tablet (0.25 mg total) by mouth 2 (two) times daily. Patient taking differently: Take 0.25 mg by mouth 2 (two) times daily.  09/15/16   Jerrol Banana., MD  diazepam (VALIUM) 2 MG tablet Take 1 tablet (2 mg total) by mouth 3 (three) times daily as needed for anxiety. Patient not taking: Reported on 07/05/2017 10/27/16   Jerrol Banana., MD  ezetimibe (ZETIA) 10 MG tablet Take 1 tablet (10 mg total) by mouth daily. 01/24/17   Jerrol Banana., MD    Allergies as of 07/04/2017 - Review Complete 04/26/2017  Allergen Reaction Noted  . Contrast media [iodinated diagnostic agents] Other (See Comments) and Rash 02/13/2012  . Other Rash 06/03/2016  . Statins Other (See Comments) 04/14/2015  . Bacitracin Swelling and Rash 06/28/2016    Family History  Problem Relation Age of Onset  . Adopted: Yes  . Family history unknown: Yes    Social History   Social History  . Marital status: Widowed    Spouse name: widowed  . Number of children: Y  . Years of education: N/A   Occupational History  . retired Personal assistant    Social History Main Topics  . Smoking status: Current Every Day Smoker    Packs/day: 1.00    Years: 55.00    Types: Cigarettes  . Smokeless tobacco: Never Used  . Alcohol use 0.0 oz/week     Comment: occcasionally 1-2  glass wine a month  . Drug use: No  . Sexual activity: No   Other Topics Concern  . Not on file   Social History Narrative   Pt was adopted.    Lives at home by herself.   Not on home oxygen.    Review of Systems: See HPI, otherwise negative ROS  Physical Exam: BP (!) 159/71   Pulse  90   Temp 97.8 F (36.6 C) (Tympanic)   Resp 20   Ht 5\' 4"  (1.626 m)   Wt 75.8 kg (167 lb)   SpO2 95%   BMI 28.67 kg/m  General:   Alert,  pleasant and cooperative in NAD Head:  Normocephalic and atraumatic. Neck:  Supple; no masses or thyromegaly. Lungs:  Clear throughout to auscultation.    Heart:  Regular rate and rhythm. Abdomen:  Soft, nontender and nondistended. Normal bowel sounds, without guarding, and without rebound.   Neurologic:  Alert and  oriented x4;  grossly normal neurologically.  Impression/Plan: Crystal White is here for an endoscopy to be performed for dysphagia  Risks, benefits, limitations, and alternatives regarding  endoscopy have been reviewed  with the patient.  Questions have been answered.  All parties agreeable.   Gaylyn Cheers, MD  07/06/2017, 3:50 PM

## 2017-07-06 NOTE — Anesthesia Post-op Follow-up Note (Signed)
Anesthesia QCDR form completed.        

## 2017-07-07 ENCOUNTER — Encounter: Payer: Self-pay | Admitting: Unknown Physician Specialty

## 2017-07-10 LAB — SURGICAL PATHOLOGY

## 2017-07-13 DIAGNOSIS — J449 Chronic obstructive pulmonary disease, unspecified: Secondary | ICD-10-CM | POA: Diagnosis not present

## 2017-07-21 ENCOUNTER — Other Ambulatory Visit: Payer: Self-pay | Admitting: Family Medicine

## 2017-08-13 DIAGNOSIS — J449 Chronic obstructive pulmonary disease, unspecified: Secondary | ICD-10-CM | POA: Diagnosis not present

## 2017-08-17 ENCOUNTER — Encounter: Payer: Self-pay | Admitting: Family Medicine

## 2017-09-12 DIAGNOSIS — J449 Chronic obstructive pulmonary disease, unspecified: Secondary | ICD-10-CM | POA: Diagnosis not present

## 2017-10-03 ENCOUNTER — Ambulatory Visit (INDEPENDENT_AMBULATORY_CARE_PROVIDER_SITE_OTHER): Payer: Medicare Other | Admitting: Family Medicine

## 2017-10-03 VITALS — BP 140/78 | HR 76 | Temp 97.8°F | Resp 14 | Wt 169.0 lb

## 2017-10-03 DIAGNOSIS — I10 Essential (primary) hypertension: Secondary | ICD-10-CM

## 2017-10-03 DIAGNOSIS — M545 Low back pain: Secondary | ICD-10-CM | POA: Diagnosis not present

## 2017-10-03 DIAGNOSIS — E785 Hyperlipidemia, unspecified: Secondary | ICD-10-CM | POA: Diagnosis not present

## 2017-10-03 DIAGNOSIS — G8929 Other chronic pain: Secondary | ICD-10-CM | POA: Diagnosis not present

## 2017-10-03 DIAGNOSIS — K429 Umbilical hernia without obstruction or gangrene: Secondary | ICD-10-CM | POA: Diagnosis not present

## 2017-10-03 DIAGNOSIS — Z23 Encounter for immunization: Secondary | ICD-10-CM

## 2017-10-03 MED ORDER — TRAMADOL HCL 50 MG PO TABS: 50.0000 mg | ORAL_TABLET | Freq: Three times a day (TID) | ORAL | 0 refills | Status: DC | PRN

## 2017-10-03 NOTE — Progress Notes (Signed)
Crystal White  MRN: [id number] DOB: 1941/03/28  Subjective:  HPI   The patient is a 76 year old female who presents for follow up of her chronic disease.  Shew as last seen for this on 03/28/17.  She did have an acute visit with Dr Caryn Section for dermatitis on 04/26/17. Patient was given lab order to have her lipids checked on last visit.  This has not been done yet and will be re-ordered today.   Last lipids were: Lab Results  Component Value Date   CHOL 292 (H) 07/25/2016   HDL 45 07/25/2016   LDLCALC 185 (H) 07/25/2016   TRIG 312 (H) 07/25/2016   Patient is also here to follow up on her hypertension.  Her last readings were: BP Readings from Last 3 Encounters:  10/03/17 140/78  07/05/17 (!) 159/71  04/26/17 140/80   The patient has not had her labs done since 07/25/16.    Patient has complaint of possible umbilical hernia. Patient reports this hurts more after eating and has not associated any change in the symptoms with bowel movements.    Patient would like to have her flu shot today. Pt continues to smoke.  Patient Active Problem List   Diagnosis Date Noted  . Incisional hernia, without obstruction or gangrene 06/03/2016  . Osteoporosis 02/22/2016  . Musculoskeletal chest pain 07/14/2015  . Chronic respiratory failure ([place]) 07/14/2015  . Acute bronchitis 07/14/2015  . Other emphysema ([place]) 07/14/2015  . Chronic renal insufficiency 07/14/2015  . Chest pain 07/13/2015  . Multiple pulmonary nodules 06/02/2015  . Colon polyp 05/29/2015  . Hemoptysis 04/29/2015  . Sprain of ankle 04/14/2015  . Anxiety 04/14/2015  . Barrett's esophagus 04/14/2015  . Acute exacerbation of chronic obstructive airways disease ([place]) 04/14/2015  . Bursitis 04/14/2015  . Cellulitis 04/14/2015  . Claudication ([place]) 04/14/2015  . Cough 04/14/2015  . Deficiency, 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  . Clinical depression 04/14/2015  . Dizziness 04/14/2015  . Dyslipidemia 04/14/2015  .  Essential (primary) hypertension 04/14/2015  . Flank pain 04/14/2015  . H/O suicide attempt 04/14/2015  . Dysphonia 04/14/2015  . Hypercholesteremia 04/14/2015  . Cannot sleep 04/14/2015  . Malaise and fatigue 04/14/2015  . Mild cognitive impairment 04/14/2015  . Cramp in muscle 04/14/2015  . Muscle ache 04/14/2015  . Neuropathy 04/14/2015  . Adiposity 04/14/2015  . 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 Candida infection of mouth 04/14/2015  . Abnormal loss of weight 04/14/2015  . Decreased body weight 04/14/2015  . Artificial cardiac pacemaker 10/08/2014  . Apnea, sleep 10/08/2014  . COPD (chronic obstructive pulmonary disease) ([place]) 02/13/2012  . Tobacco abuse 02/13/2012  . Sinus congestion 02/13/2012  . Current tobacco use 02/13/2012  . Esophagitis, reflux 08/28/2006  . Abdominal pain, right lower quadrant 08/22/2005  . Acute appendicitis with peritoneal abscess 08/22/2005    Past Medical History:  Diagnosis Date  . Arthritis    hands, lower back  . Barrett's esophagus   . COPD (chronic obstructive pulmonary disease) ([place])   . Emphysema lung ([place])   . GERD (gastroesophageal reflux disease)   . Hyperlipidemia   . Hypertension   . Mobitz type II atrioventricular block   . OSA (obstructive sleep apnea)    on CPAP  . Peripheral neuropathy   . Presence of permanent cardiac pacemaker 09/22/11   Biotronik - EVIADR-T [facility])  . Shortness of breath dyspnea   . Stomach ulcer     Social History   Socioeconomic History  . Marital status:  Widowed    Spouse name: widowed  . Number of children: Y  . Years of education: Not on file  . Highest education level: Not on file  Social Needs  . Financial resource strain: Not on file  . Food insecurity - worry: Not on file  . Food insecurity - inability: Not on file  . Transportation needs - medical: Not on file  . Transportation needs - non-medical: Not on file  Occupational History  . Occupation: retired Personal assistant  Tobacco  Use  . Smoking status: Current Every Day Smoker    Packs/day: 1.00    Years: 55.00    Pack years: 55.00    Types: Cigarettes  . Smokeless tobacco: Never Used  Substance and Sexual Activity  . Alcohol use: Yes    Alcohol/week: 0.0 oz    Comment: occcasionally 1-2  glass wine a month  . Drug use: No  . Sexual activity: No  Other Topics Concern  . Not on file  Social History Narrative   Pt was adopted.    Lives at home by herself.   Not on home oxygen.    Outpatient Encounter Medications as of 10/03/2017  Medication Sig Note  . acetaminophen (TYLENOL) 500 MG tablet Take 500 mg by mouth every 6 (six) hours as needed.   Marland Kitchen albuterol (PROVENTIL HFA) 108 (90 Base) MCG/ACT inhaler Inhale 2 puffs into the lungs every 6 (six) hours as needed for wheezing or shortness of breath.   Marland Kitchen alendronate (FOSAMAX) 70 MG tablet Take 1 tablet (70 mg total) by mouth once a week.   . ALPRAZolam (XANAX) 0.25 MG tablet Take 1 tablet (0.25 mg total) by mouth 2 (two) times daily. (Patient taking differently: Take 0.25 mg by mouth 2 (two) times daily. ) 01/24/2017: As needed  . citalopram (CELEXA) 10 MG tablet TAKE 1 TABLET BY MOUTH AT  BEDTIME   . ezetimibe (ZETIA) 10 MG tablet Take 1 tablet (10 mg total) by mouth daily.   . fluticasone furoate-vilanterol (BREO ELLIPTA) 100-25 MCG/INH AEPB Inhale 1 puff into the lungs daily.   Marland Kitchen gabapentin (NEURONTIN) 100 MG capsule TAKE 2 CAPSULES BY MOUTH 3  TIMES DAILY   . hydrochlorothiazide (HYDRODIURIL) 25 MG tablet TAKE 1 TABLET BY MOUTH  DAILY   . hyoscyamine (LEVBID) 0.375 MG 12 hr tablet Take 1 tablet (0.375 mg total) by mouth 2 (two) times daily as needed for cramping. Reported on 05/03/2016   . losartan (COZAAR) 50 MG tablet TAKE 1 TABLET BY MOUTH AT  BEDTIME   . meclizine (ANTIVERT) 25 MG tablet Take 1 tablet (25 mg total) by mouth 2 (two) times daily as needed for dizziness. 01/24/2017: As needed  . mirtazapine (REMERON) 30 MG tablet TAKE 1 TABLET BY MOUTH AT  BEDTIME    . OXYGEN Inhale into the lungs daily. Uses at night with CPAP and PRN during daytime   . pantoprazole (PROTONIX) 40 MG tablet TAKE 1 TABLET BY MOUTH TWO  TIMES DAILY   . traZODone (DESYREL) 150 MG tablet Take 1 tablet (150 mg total) by mouth at bedtime.   . [DISCONTINUED] diazepam (VALIUM) 2 MG tablet Take 1 tablet (2 mg total) by mouth 3 (three) times daily as needed for anxiety. (Patient not taking: Reported on 07/05/2017) 01/24/2017: As needed   No facility-administered encounter medications on file as of 10/03/2017.     Allergies  Allergen Reactions  . Iodinated Diagnostic Agents Other (See Comments) and Rash    Pain and burning Reaction:  Burning and redness  Pain and burning  . Other Rash    Pain and burning  . Iodine Other (See Comments)  . Statins Other (See Comments)    Reaction:  Leg cramps   . Bacitracin Swelling and Rash    Review of Systems  Constitutional: Negative for fever and malaise/fatigue.  HENT: Negative.   Eyes: Negative.   Respiratory: Positive for shortness of breath (chronic and unchanged) and wheezing (chronic and unchanged). Negative for cough, hemoptysis and sputum production.   Cardiovascular: Negative for chest pain, palpitations, orthopnea, claudication and leg swelling.  Gastrointestinal: Positive for abdominal pain and diarrhea.  Skin: Negative.   Neurological: Negative for dizziness, weakness and headaches.  Endo/Heme/Allergies: Negative.   Psychiatric/Behavioral: Negative.     Objective:  BP 140/78 (BP Location: Right Arm, Patient Position: Sitting, Cuff Size: Normal)   Pulse 76   Temp 97.8 F (36.6 C) (Oral)   Resp 14   Wt 169 lb (76.7 kg)   BMI 29.01 kg/m   Physical Exam  Constitutional: She is oriented to person, place, and time and well-developed, well-nourished, and in no distress.  HENT:  Head: Normocephalic and atraumatic.  Eyes: Conjunctivae are normal. No scleral icterus.  Neck: No thyromegaly present.  Cardiovascular: Normal  rate, regular rhythm and normal heart sounds.  Pulmonary/Chest: Effort normal and breath sounds normal.  Abdominal: Soft. Bowel sounds are normal.  Umbilical hernia .  Neurological: She is alert and oriented to person, place, and time. Gait normal. GCS score is 15.  Skin: Skin is warm and dry.  Psychiatric: Mood, memory, affect and judgment normal.    Assessment and Plan :  HLD HTN COPD GERD Tobacco Abuse Pt advised to quit. OSA Pacemaker All stable. Pt to get labs. Umbilical Hernia Chronic Depression  I have done the exam and reviewed the chart and it is accurate to the best of my knowledge. Development worker, community has been used and  any errors in dictation or transcription are unintentional. [name] M.D. [facility]

## 2017-10-05 ENCOUNTER — Ambulatory Visit: Payer: Medicare Other | Admitting: Internal Medicine

## 2017-10-08 ENCOUNTER — Encounter: Payer: Self-pay | Admitting: Family Medicine

## 2017-10-10 ENCOUNTER — Encounter: Payer: Self-pay | Admitting: Surgery

## 2017-10-10 ENCOUNTER — Ambulatory Visit: Payer: Medicare Other | Admitting: Internal Medicine

## 2017-10-10 ENCOUNTER — Encounter: Payer: Self-pay | Admitting: Internal Medicine

## 2017-10-10 ENCOUNTER — Ambulatory Visit: Payer: Medicare Other | Admitting: Surgery

## 2017-10-10 VITALS — BP 118/68 | HR 92 | Resp 16 | Ht 64.0 in | Wt 167.0 lb

## 2017-10-10 VITALS — BP 134/82 | HR 99 | Temp 98.0°F | Wt 166.0 lb

## 2017-10-10 DIAGNOSIS — F17210 Nicotine dependence, cigarettes, uncomplicated: Secondary | ICD-10-CM | POA: Diagnosis not present

## 2017-10-10 DIAGNOSIS — J9611 Chronic respiratory failure with hypoxia: Secondary | ICD-10-CM | POA: Diagnosis not present

## 2017-10-10 DIAGNOSIS — K432 Incisional hernia without obstruction or gangrene: Secondary | ICD-10-CM

## 2017-10-10 DIAGNOSIS — J438 Other emphysema: Secondary | ICD-10-CM

## 2017-10-10 DIAGNOSIS — Z720 Tobacco use: Secondary | ICD-10-CM

## 2017-10-10 NOTE — Patient Instructions (Signed)
--  Will send for Ct lung cancer screening.  --Continue your current inhalers.   --Quitting smoking is the most important thing that you can do for your health.  --Quitting smoking will have greater affect on your health than any medicine that we can give you.

## 2017-10-10 NOTE — Progress Notes (Signed)
Surgical Clinic History and Physical  Referring provider:  Jerrol Banana., MD [address] [place], [place] [address]  HISTORY OF PRESENT ILLNESS (HPI):  76 y.o. female presents for evaluation of a supraumbilical bulge. Patient reports she has had this central abdominal protrusion x 2.5 years since she underwent laparoscopic-assisted ileocecectomy for a sessile polyp not amenable to colonoscopic resection. She states that the protrusion becomes more evident after she eats or when coughing, which she describes she does quite frequently. She otherwise denies any pain associated with her protrusion and denies constipation with instead regular daily loose BM's x likewise 2.5 years. Aside from patient saying she'd prefer not to see a lump in her abdomen, she says she is not interested in surgery for the protrusion. She also describes chest tightness and shortness of breath with any exertion including picking up a bag of groceries, vacuuming, or walking up or down a flight of steps, during which she needs to stop at least once or more. She lastly admits she continues to smoke 1 ppd and says she is not interested in attempting to quite and accepts the risks of stroke, lower extremity amputation, lung cancer, MI, or other similarly smoking-associated problems.  PAST MEDICAL HISTORY (PMH):  Past Medical History:  Diagnosis Date  . Arthritis    hands, lower back  . Barrett's esophagus   . COPD (chronic obstructive pulmonary disease) ([place])   . Emphysema lung ([place])   . GERD (gastroesophageal reflux disease)   . Hyperlipidemia   . Hypertension   . Mobitz type II atrioventricular block   . OSA (obstructive sleep apnea)    on CPAP  . Peripheral neuropathy   . Presence of permanent cardiac pacemaker 09/22/11   Biotronik - EVIADR-T [facility])  . Shortness of breath dyspnea   . Stomach ulcer      PAST SURGICAL HISTORY ([place]):  Past Surgical History:  Procedure Laterality Date  .  APPENDECTOMY  2006   Dr. Pat Patrick  . CARDIAC CATHETERIZATION  2007  . COLON SURGERY    . HIATAL HERNIA REPAIR  2007  . NISSEN FUNDOPLICATION  1448  . PACEMAKER INSERTION  09/22/11   [facility]  - Biotronik EVIADR-T  . TOTAL ABDOMINAL HYSTERECTOMY  1980     MEDICATIONS:  Prior to Admission medications   Medication Sig Start Date End Date Taking? Authorizing Provider  acetaminophen (TYLENOL) 500 MG tablet Take 500 mg by mouth every 6 (six) hours as needed.   Yes [provider]  albuterol (PROVENTIL HFA) 108 (90 Base) MCG/ACT inhaler Inhale 2 puffs into the lungs every 6 (six) hours as needed for wheezing or shortness of breath. 09/14/16  Yes Jerrol Banana., MD  alendronate (FOSAMAX) 70 MG tablet Take 1 tablet (70 mg total) by mouth once a week. 09/14/16  Yes Jerrol Banana., MD  ALPRAZolam Duanne Moron) 0.25 MG tablet Take 1 tablet (0.25 mg total) by mouth 2 (two) times daily. Patient taking differently: Take 0.25 mg by mouth 2 (two) times daily.  09/15/16  Yes Jerrol Banana., MD  citalopram (CELEXA) 10 MG tablet TAKE 1 TABLET BY MOUTH AT  BEDTIME 07/21/17  Yes Jerrol Banana., MD  ezetimibe (ZETIA) 10 MG tablet Take 1 tablet (10 mg total) by mouth daily. 01/24/17  Yes Jerrol Banana., MD  fluticasone furoate-vilanterol (BREO ELLIPTA) 100-25 MCG/INH AEPB Inhale 1 puff into the lungs daily. 09/14/16  Yes Jerrol Banana., MD  gabapentin (  NEURONTIN) 100 MG capsule TAKE 2 CAPSULES BY MOUTH 3  TIMES DAILY 07/21/17  Yes Jerrol Banana., MD  hydrochlorothiazide (HYDRODIURIL) 25 MG tablet TAKE 1 TABLET BY MOUTH  DAILY 07/21/17  Yes Jerrol Banana., MD  hyoscyamine (LEVBID) 0.375 MG 12 hr tablet Take 1 tablet (0.375 mg total) by mouth 2 (two) times daily as needed for cramping. Reported on 05/03/2016 09/14/16  Yes Jerrol Banana., MD  losartan (COZAAR) 50 MG tablet TAKE 1 TABLET BY MOUTH AT  BEDTIME 07/21/17  Yes Jerrol Banana., MD  meclizine  (ANTIVERT) 25 MG tablet Take 1 tablet (25 mg total) by mouth 2 (two) times daily as needed for dizziness. 09/14/16  Yes Jerrol Banana., MD  mirtazapine (REMERON) 30 MG tablet TAKE 1 TABLET BY MOUTH AT  BEDTIME 07/21/17  Yes Jerrol Banana., MD  OXYGEN Inhale into the lungs daily. Uses at night with CPAP and PRN during daytime   Yes [provider]  pantoprazole (PROTONIX) 40 MG tablet TAKE 1 TABLET BY MOUTH TWO  TIMES DAILY 07/21/17  Yes Jerrol Banana., MD  traMADol (ULTRAM) 50 MG tablet Take 1 tablet (50 mg total) every 8 (eight) hours as needed by mouth. 10/03/17  Yes Jerrol Banana., MD  traZODone (DESYREL) 150 MG tablet Take 1 tablet (150 mg total) by mouth at bedtime. 09/14/16  Yes Jerrol Banana., MD     ALLERGIES:  Allergies  Allergen Reactions  . Iodinated Diagnostic Agents Other (See Comments) and Rash    Pain and burning Reaction:  Burning and redness  Pain and burning  . Other Rash    Pain and burning  . Iodine Other (See Comments)  . Statins Other (See Comments)    Reaction:  Leg cramps   . Bacitracin Swelling and Rash     SOCIAL HISTORY:  Social History   Socioeconomic History  . Marital status: Widowed    Spouse name: widowed  . Number of children: Y  . Years of education: Not on file  . Highest education level: Not on file  Social Needs  . Financial resource strain: Not on file  . Food insecurity - worry: Not on file  . Food insecurity - inability: Not on file  . Transportation needs - medical: Not on file  . Transportation needs - non-medical: Not on file  Occupational History  . Occupation: retired Personal assistant  Tobacco Use  . Smoking status: Current Every Day Smoker    Packs/day: 1.00    Years: 55.00    Pack years: 55.00    Types: Cigarettes  . Smokeless tobacco: Never Used  Substance and Sexual Activity  . Alcohol use: Yes    Alcohol/week: 0.0 oz    Comment: occcasionally 1-2  glass wine a month  . Drug  use: No  . Sexual activity: No  Other Topics Concern  . Not on file  Social History Narrative   Pt was adopted.    Lives at home by herself.   Not on home oxygen.    The patient currently resides (home / rehab facility / nursing home): Home The patient normally is (ambulatory / bedbound): Ambulatory  FAMILY HISTORY:  Family History  Adopted: Yes  Family history unknown: Yes    Otherwise negative/non-contributory.  REVIEW OF SYSTEMS:  Constitutional: denies any other weight loss, fever, chills, or sweats  Eyes: denies any other vision changes, history of eye injury  ENT:  denies sore throat, hearing problems  Respiratory: denies shortness of breath, wheezing  Cardiovascular: denies chest pain, palpitations  Gastrointestinal: denies abdominal pain, N/V, or constipation, +"bulge" Musculoskeletal: denies any other joint pains or cramps  Skin: Denies any other rashes or skin discolorations Neurological: denies any other headache, dizziness, weakness  Psychiatric: Denies any other depression, anxiety   All other review of systems were otherwise negative   VITAL SIGNS:  BP 134/82   Pulse 99   Temp 98 F (36.7 C) (Oral)   Wt 166 lb (75.3 kg)   BMI 28.49 kg/m   PHYSICAL EXAM:  Constitutional:  -- Normal body habitus  -- Awake, alert, and oriented x3  Eyes:  -- Pupils equally round and reactive to light  -- No scleral icterus  Ear, nose, throat:  -- No jugular venous distension -- No nasal drainage, bleeding Pulmonary:  -- No crackles  -- Equal breath sounds bilaterally -- Breathing non-labored at rest Cardiovascular:  -- S1, S2 present  -- No pericardial rubs  Gastrointestinal:  -- Abdomen soft, nontender, non-distended, no guarding/rebound  -- Non-tender easily reducible large-defect supraumbilical incisional hernia -- No other abdominal masses appreciated, pulsatile or otherwise  Musculoskeletal and Integumentary:  -- Wounds or skin discoloration: None  appreciated -- Extremities: B/L UE and LE FROM, hands and feet warm  Neurologic:  -- Motor function: Intact and symmetric -- Sensation: Intact and symmetric  Labs:  CBC:  Lab Results  Component Value Date   WBC CANCELED 07/25/2016   WBC 26.3 (H) 05/27/2016   RBC CANCELED 07/25/2016   RBC 5.19 05/27/2016   BMP:  Lab Results  Component Value Date   GLUCOSE 92 07/25/2016   GLUCOSE 117 (H) 05/27/2016   GLUCOSE 112 (H) 12/24/2012   CO2 25 07/25/2016   CO2 29 12/24/2012   BUN 19 07/25/2016   BUN 19 (H) 12/24/2012   CREATININE 1.27 (H) 07/25/2016   CREATININE 1.36 (H) 12/24/2012   CALCIUM 10.0 07/25/2016   CALCIUM 9.6 12/24/2012     Imaging studies:  CT Abdomen and Pelvis without Contrast (05/26/2016) - personally reviewed Ventral hernia containing a loop of transverse colon but  no demonstrable bowel compromise. No bowel obstruction. No  abscess. Patient has had a portion of the right colon appendix  removed. Anastomosis patent in the rightabdomen.  Assessment/Plan:  76 y.o. female with a large easily reducible and essentially minimally symptomatic ventral incisional hernia, complicated by significant co-morbidities including COPD with dyspnea and chest tightness with minimal exertion not yet on home oxygen, OSA requiring CPAP qhs, chronic cardiac arrhythmia s/p pacemaker implantation, HTN, HLD, PAD, chronic ongoing tobacco abuse, GERD with Barret's esophagus, osteoarthritis, and peripheral neuropathy.   - abdominal binder provided   - patient expresses she does not wish to consider surgical repair at this time   - techniques for hernia self-reduction discussed and reviewed, indications for urgent evaluation provided  - if patient becomes symptomatic and interested in elective repair, would require pre-operative risk stratification and optimization  - smoking cessation strongly advised and discussed, all of patient's and her son's questions answered  - return to clinic as  needed, instructed to call if any questions or concerns  All of the above recommendations were discussed with the patient and patient's son, and all of patient's and family's questions were answered to their expressed satisfaction.  Thank you for the opportunity to participate in this patient's care.  -- Marilynne Drivers Rosana Hoes, MD, [place]: [facility] General Surgery -  Partnering for exceptional care. Office: [phone]

## 2017-10-10 NOTE — Patient Instructions (Addendum)
Diastasis Recti Diastasis recti is when the muscles of the abdomen (rectus abdominis muscles) become thin and separate. The result is a wider space between the right and left abdomen (abdominal) muscles. This wider space between the muscles may cause a bulge in the middle of your abdomen. You may notice this bulge when you are straining or when you sit up from a lying down position. Diastasis recti can affect men and women. It is most common among pregnant women, infants, people who are obese, and people who have had abdominal surgery. Exercise or surgical treatment may help correct it. What are the causes? Common causes of this condition include:  Pregnancy. The growing uterus puts pressure on the abdominal muscles, which causes the muscles to separate.  Obesity. Excess fat puts pressure on abdominal muscles.  Weightlifting.  Some abdomen exercises.  Advanced age.  Genetics.  Prior abdominal surgery.  What increases the risk? This condition is more likely to develop in:  Women.  Newborns, especially newborns who are born early (prematurely).  What are the signs or symptoms? Common symptoms of this condition include:  A bulge in the middle of the abdomen. You will notice it most when you sit up or strain.  Pain in the low back, pelvis, or hips.  Constipation.  Inability to control when you urinate (urinary incontinence).  Bloating.  Poor posture.  How is this diagnosed? This condition is diagnosed with a physical exam. Your health care provider will ask you to lie flat on your back and do a crunch or half sit-up. If you have diastasis recti, a vertical bulge will appear between your abdominal muscles in the center of your abdomen. Your health care provider will measure the gap between your muscles with one of the following:  A medical device used to measure the space between two objects (caliper).  A tape measure.  CT scan.  Ultrasound.  Finger spaces. Your health  care provider will measure the space using their fingers.  How is this treated? If your muscle separation is not too large, you may not need treatment. However, if you are a woman who plans to become pregnant again, you should treat this condition before your next pregnancy. Treatment may include:  Physical therapy to strengthen and tighten your abdominal muscles.  Lifestyle changes such as weight loss and exercise.  Over-the-counter pain medicines as needed.  Surgery to correct the separation.  Follow these instructions at home: Activity  Return to your normal activities as told by your health care provider. Ask your health care provider what activities are safe for you.  When lifting weights or doing exercises using your abdominal muscles or the muscles in the center of your body that give stability (core muscles), make sure you are doing your exercises and movements correctly. Proper form can help to prevent the condition from happening again. General instructions  If you are overweight, ask your health care provider for help with weight loss. Losing even a small amount of weight can help to improve your diastasis recti.  Take over-the-counter or prescription medicines only as told by your health care provider.  Do not strain. Straining can make the separation worse. Examples of straining include: ? Pushing hard to have a bowel movement, such as due to constipation. ? Lifting heavy objects, including children. ? Standing up and sitting down.  Take steps to prevent constipation: ? Drink enough fluid to keep your urine clear or pale yellow. ? Take over-the-counter or prescription medicines only as  directed. ? Eat foods that are high in fiber, such as fresh fruits and vegetables, whole grains, and beans. ? Limit foods that are high in fat and processed sugars, such as fried and sweet foods. Contact a health care provider if:  You notice a new bulge in your abdomen. Get help  right away if:  You experience severe discomfort in your abdomen.  You develop severe abdominal pain along with nausea, vomiting, or fever. Summary  Diastasis recti is when the abdomen (abdominal) muscles become thin and separate. Your abdomen will stick out because the space between your right and left abdomen muscles has widened.  The most common symptom is a bulge in your abdomen. You will notice it most when you sit up or are straining.  This condition is diagnosed during a physical exam.  If the abdomen separation is not too big, you may choose not to have treatment. Otherwise, you may need to undergo physical therapy or surgery. This information is not intended to replace advice given to you by your health care provider. Make sure you discuss any questions you have with your health care provider. Document Released: 01/09/2017 Document Revised: 01/09/2017 Document Reviewed: 01/09/2017 Elsevier Interactive Patient Education  2018 [employer].   Ventral Hernia A ventral hernia is a bulge of tissue from inside the abdomen that pushes through a weak area of the muscles that form the front wall of the abdomen. The tissues inside the abdomen are inside a sac (peritoneum). These tissues include the small intestine, large intestine, and the fatty tissue that covers the intestines (omentum). Sometimes, the bulge that forms a hernia contains intestines. Other hernias contain only fat. Ventral hernias do not go away without surgical treatment. There are several types of ventral hernias. You may have:  A hernia at an incision site from previous abdominal surgery (incisional hernia).  A hernia just above the belly button (epigastric hernia), or at the belly button (umbilical hernia). These types of hernias can develop from heavy lifting or straining.  A hernia that comes and goes (reducible hernia). It may be visible only when you lift or strain. This type of hernia can be pushed back into the  abdomen (reduced).  A hernia that traps abdominal tissue inside the hernia (incarcerated hernia). This type of hernia does not reduce.  A hernia that cuts off blood flow to the tissues inside the hernia (strangulated hernia). The tissues can start to die if this happens. This is a very painful bulge that cannot be reduced. A strangulated hernia is a medical emergency.  What are the causes? This condition is caused by abdominal tissue putting pressure on an area of weakness in the abdominal muscles. What increases the risk? The following factors may make you more likely to develop this condition:  Being female.  Being 22 or older.  Being overweight or obese.  Having had previous abdominal surgery, especially if there was an infection after surgery.  Having had an injury to the abdominal wall.  Having had several pregnancies.  Having a buildup of fluid inside the abdomen (ascites).  What are the signs or symptoms? The only symptom of a ventral hernia may be a painless bulge in the abdomen. A reducible hernia may be visible only when you strain, cough, or lift. Other symptoms may include:  Dull pain.  A feeling of pressure.  Signs and symptoms of a strangulated hernia may include:  Increasing pain.  Nausea and vomiting.  Pain when pressing on the hernia.  The skin over the hernia turning red or purple.  Constipation.  Blood in the stool (feces).  How is this diagnosed? This condition may be diagnosed based on:  Your symptoms.  Your medical history.  A physical exam. You may be asked to cough or strain while standing. These actions increase the pressure inside your abdomen and force the hernia through the opening in your muscles. Your health care provider may try to reduce the hernia by pressing on it.  Imaging studies, such as an ultrasound or CT scan.  How is this treated? This condition is treated with surgery. If you have a strangulated hernia, surgery is done  as soon as possible. If your hernia is small and not incarcerated, you may be asked to lose some weight before surgery. Follow these instructions at home:  Follow instructions from your health care provider about eating or drinking restrictions.  If you are overweight, your health care provider may recommend that you increase your activity level and eat a healthier diet.  Do not lift anything that is heavier than 10 lb (4.5 kg).  Return to your normal activities as told by your health care provider. Ask your health care provider what activities are safe for you. You may need to avoid activities that increase pressure on your hernia.  Take over-the-counter and prescription medicines only as told by your health care provider.  Keep all follow-up visits as told by your health care provider. This is important. Contact a health care provider if:  Your hernia gets larger.  Your hernia becomes painful. Get help right away if:  Your hernia becomes increasingly painful.  You have pain along with any of the following: ? Changes in skin color in the area of the hernia. ? Nausea. ? Vomiting. ? Fever. Summary  A ventral hernia is a bulge of tissue from inside the abdomen that pushes through a weak area of the muscles that form the front wall of the abdomen.  This condition is treated with surgery, which may be urgent depending on your hernia.  Do not lift anything that is heavier than 10 lb (4.5 kg), and follow activity instructions from your health care provider. This information is not intended to replace advice given to you by your health care provider. Make sure you discuss any questions you have with your health care provider. Document Released: 10/31/2012 Document Revised: 07/01/2016 Document Reviewed: 07/01/2016 Elsevier Interactive Patient Education  [employer].

## 2017-10-10 NOTE — Progress Notes (Signed)
[facility] Consultation      Assessment and Plan:  COPD/emphysema, with chronic hypoxic respiratory failure. -on Breo 100 to be used as once daily. -Discussed trying to increase her activity level due to presence of debility/deconditioning. -Continue oxygen at 2 L with activity.  Lung nodules. -Reviewed, most recent CT of the chest images from 04/29/2015, lung nodules appear stable, since 02/02/2015 -CT chest 10/25/16--no change in size of nodules, completed about 18 months of surveillance, and no further surveillance would be recommended for these nodules.  --She continues to have concerns about lung cancer, will refer to lung cancer screening.   Nicotine abuse. -Discussed the importance of smoking cessation, she does feel that she is ready to quit at this time.  Spent greater than 3 minutes in discussion -Was given prescription for Chantix at previous visit, however it caused severe nightmares.  She is not interested in trying nicotine patches.  Sleep apnea.  -Continue with CPAP at 11, oxygen via CPAP.   Date: 10/10/2017  MRN# [id number] Crystal White [birth year]    Crystal White is a 76 y.o. old female seen in consultation for chief complaint of:    Chief Complaint  Patient presents with  . COPD    Pt here for f/u COPD.She says she feels ok. She has cough and wheezine but not alot.   . Sleep Apnea    CPAP 11 and 2 liter 02 at night.    HPI:   The patient is a 76 year old female with a history of COPD, obstructive sleep apnea, hypertension, GERD. She had a CT of the chest on 04/29/2015 which showed bilateral emphysema and multiple pulmonary nodules, the largest of which was 7 mm.  Currently, she is using Breo once daily, and appears to continue do well with this. Her breathing has remained stable. She continue to smoke about 1 ppd or less. She is considering quitting, she had a script for chantix, but it caused nightmares. Currently she does not have a  desire to quit.    She is taking Breo every day, she got a new puppy, does not sleep in the bedroom, the dog helps her keep moving.  She is still having trouble with ADLs such as carrying groceries. She is smoking half to 1 ppd. She has quit in the past with chantix.   She is using her CPAP every night, with oxygen.   Review of testing/summary: CT chest 08/25/16 reviewed, no significant change from previous.   Previous testing: CT chest 02/02/2016:Stable sub-cm bilateral pulmonary nodules shows bilateral emphysematous changes in both lungs, CT chest 08/04/2015: Minimally changed, pulmonary nodules, the largest of which was 7 mm. No significant change when compared with previous CT film from 04/29/2015.  CPAP titration study 09/15/2015: Requires CPAP at a pressure of 11. Oxygen desaturations were noted. Sleep study 08/11/2015: Mild sleep apnea with an apnea-hypopnea index of 14.  Medication:       Allergies:  Iodinated diagnostic agents; Other; Iodine; Statins; and Bacitracin  Review of Systems: Gen:  Denies  fever, sweats, chills HEENT: Denies blurred vision, double vision. . Cvc:  No dizziness, chest pain. Resp:   Denies cough or sputum production. Gi: Denies swallowing difficulty, stomach pain. Gu:  Denies bladder incontinence, burning urine Ext:   No Joint pain, stiffness. Skin: No skin rash,  hives  Endoc:  No polyuria, polydipsia. Psych: No depression, insomnia. Other:  All other systems were reviewed with the patient and were negative other that what is  mentioned in the HPI.   Physical Examination:   VS: BP 118/68 (BP Location: Left Arm, Cuff Size: Normal)   Pulse 92   Resp 16   Ht 5\' 4"  (1.626 m)   Wt 167 lb (75.8 kg)   SpO2 97%   BMI 28.67 kg/m   General Appearance: No distress  Neuro:without focal findings,  speech normal,  HEENT: PERRLA, EOM intact.   Pulmonary: decreased air entry bilaterally.   CardiovascularNormal S1,S2.  No m/r/g.   Abdomen: Benign,  Soft, non-tender. Renal:  No costovertebral tenderness  GU:  No performed at this time. Endoc: No evident thyromegaly, no signs of acromegaly. Skin:   warm, no rashes, no ecchymosis  Extremities: normal, no cyanosis, clubbing.  Other findings:    LABORATORY PANEL:   CBC No results for input(s): WBC, HGB, HCT, PLT in the last 168 hours. ------------------------------------------------------------------------------------------------------------------  Chemistries  No results for input(s): NA, K, CL, CO2, GLUCOSE, BUN, CREATININE, CALCIUM, MG, AST, ALT, ALKPHOS, BILITOT in the last 168 hours.  Invalid input(s): GFRCGP ------------------------------------------------------------------------------------------------------------------  Cardiac Enzymes No results for input(s): TROPONINI in the last 168 hours. ------------------------------------------------------------  RADIOLOGY:  No results found.     Thank  you for the consultation and for allowing [facility] to assist in the care of your patient. Our recommendations are noted above.  Please contact us if we can be of further service.   Marda Stalker, MD.  Board Certified in Internal Medicine, Pulmonary Medicine, [place], and Sleep Medicine.  [facility] Office Number: [phone]  Patricia Pesa, M.D.  Merton Border, M.D  10/10/2017

## 2017-10-11 ENCOUNTER — Telehealth: Payer: Self-pay | Admitting: *Deleted

## 2017-10-11 DIAGNOSIS — Z122 Encounter for screening for malignant neoplasm of respiratory organs: Secondary | ICD-10-CM

## 2017-10-11 DIAGNOSIS — Z87891 Personal history of nicotine dependence: Secondary | ICD-10-CM

## 2017-10-11 NOTE — Telephone Encounter (Signed)
Received referral for initial lung cancer screening scan. Contacted patient and obtained smoking history,(current, 55 pack year) as well as answering questions related to screening process. Patient denies signs of lung cancer such as weight loss or hemoptysis. Patient denies comorbidity that would prevent curative treatment if lung cancer were found. Patient is scheduled for shared decision making visit and CT scan on 10/18/17.

## 2017-10-13 DIAGNOSIS — J449 Chronic obstructive pulmonary disease, unspecified: Secondary | ICD-10-CM | POA: Diagnosis not present

## 2017-10-17 ENCOUNTER — Other Ambulatory Visit: Payer: Self-pay | Admitting: Family Medicine

## 2017-10-18 ENCOUNTER — Inpatient Hospital Stay: Payer: Medicare Other | Attending: Oncology | Admitting: Oncology

## 2017-10-18 ENCOUNTER — Ambulatory Visit
Admission: RE | Admit: 2017-10-18 | Discharge: 2017-10-18 | Disposition: A | Discharge status: Home / Self Care | Payer: Medicare Other | Source: Ambulatory Visit | Attending: Oncology | Admitting: Oncology

## 2017-10-18 DIAGNOSIS — Z87891 Personal history of nicotine dependence: Secondary | ICD-10-CM

## 2017-10-18 DIAGNOSIS — I2510 Atherosclerotic heart disease of native coronary artery without angina pectoris: Secondary | ICD-10-CM | POA: Insufficient documentation

## 2017-10-18 DIAGNOSIS — D3501 Benign neoplasm of right adrenal gland: Secondary | ICD-10-CM | POA: Diagnosis not present

## 2017-10-18 DIAGNOSIS — J439 Emphysema, unspecified: Secondary | ICD-10-CM | POA: Insufficient documentation

## 2017-10-18 DIAGNOSIS — I700 Atherosclerosis of aorta: Secondary | ICD-10-CM | POA: Insufficient documentation

## 2017-10-18 DIAGNOSIS — F17210 Nicotine dependence, cigarettes, uncomplicated: Secondary | ICD-10-CM | POA: Diagnosis not present

## 2017-10-18 DIAGNOSIS — Z122 Encounter for screening for malignant neoplasm of respiratory organs: Secondary | ICD-10-CM | POA: Insufficient documentation

## 2017-10-18 NOTE — Progress Notes (Signed)
In accordance with CMS guidelines, patient has met eligibility criteria including age, absence of signs or symptoms of lung cancer.  Social History   Tobacco Use  . Smoking status: Current Every Day Smoker    Packs/day: 1.00    Years: 55.00    Pack years: 55.00    Types: Cigarettes  . Smokeless tobacco: Never Used  Substance Use Topics  . Alcohol use: Yes    Alcohol/week: 0.0 oz    Comment: occcasionally 1-2  glass wine a month  . Drug use: No     A shared decision-making session was conducted prior to the performance of CT scan. This includes one or more decision aids, includes benefits and harms of screening, follow-up diagnostic testing, over-diagnosis, false positive rate, and total radiation exposure.  Counseling on the importance of adherence to annual lung cancer LDCT screening, impact of co-morbidities, and ability or willingness to undergo diagnosis and treatment is imperative for compliance of the program.  Counseling on the importance of continued smoking cessation for former smokers; the importance of smoking cessation for current smokers, and information about tobacco cessation interventions have been given to patient including [place] and [employer] quit [place] programs.  Written order for lung cancer screening with LDCT has been given to the patient and any and all questions have been answered to the best of my abilities.   Yearly follow up will be coordinated by Burgess Estelle, Thoracic Navigator.  Faythe Casa, NP 10/18/2017 1:45 PM

## 2017-10-24 ENCOUNTER — Telehealth: Payer: Self-pay | Admitting: *Deleted

## 2017-10-24 NOTE — Telephone Encounter (Signed)
Notified patient of LDCT lung cancer screening program results with recommendation for 12 month follow up imaging. Also notified of incidental findings noted below and is encouraged to discuss further with PCP who will receive a copy of this note and/or the CT report. Patient verbalizes understanding.   IMPRESSION: 1. Lung-RADS 2, benign appearance or behavior. Continue annual screening with low-dose chest CT without contrast in 12 months. 2. Coronary artery atherosclerosis. Aortic Atherosclerosis (ICD10-I70.0). 3.  Emphysema (ICD10-J43.9). 4. Right adrenal adenoma.

## 2017-10-30 ENCOUNTER — Other Ambulatory Visit: Payer: Self-pay | Admitting: Family Medicine

## 2017-11-01 DIAGNOSIS — I739 Peripheral vascular disease, unspecified: Secondary | ICD-10-CM | POA: Diagnosis not present

## 2017-11-01 DIAGNOSIS — J449 Chronic obstructive pulmonary disease, unspecified: Secondary | ICD-10-CM | POA: Diagnosis not present

## 2017-11-01 DIAGNOSIS — I10 Essential (primary) hypertension: Secondary | ICD-10-CM | POA: Diagnosis not present

## 2017-11-01 DIAGNOSIS — G4733 Obstructive sleep apnea (adult) (pediatric): Secondary | ICD-10-CM | POA: Diagnosis not present

## 2017-11-12 DIAGNOSIS — J449 Chronic obstructive pulmonary disease, unspecified: Secondary | ICD-10-CM | POA: Diagnosis not present

## 2017-12-13 DIAGNOSIS — J449 Chronic obstructive pulmonary disease, unspecified: Secondary | ICD-10-CM | POA: Diagnosis not present

## 2018-01-04 ENCOUNTER — Telehealth: Payer: Self-pay | Admitting: Family Medicine

## 2018-01-04 NOTE — Telephone Encounter (Signed)
Pt stated that she mailed her [employer] handicap form to the office sometime last. Pt is requesting a status update and to make sure we received the form. Please advise. Thanks TNP

## 2018-01-04 NOTE — Telephone Encounter (Signed)
Pt informed that we are working on it. She says she gets it for her COPD.

## 2018-01-13 ENCOUNTER — Ambulatory Visit (INDEPENDENT_AMBULATORY_CARE_PROVIDER_SITE_OTHER): Payer: Medicare Other | Admitting: Family Medicine

## 2018-01-13 ENCOUNTER — Encounter: Payer: Self-pay | Admitting: Family Medicine

## 2018-01-13 VITALS — BP 120/76 | HR 104 | Temp 99.8°F | Resp 20 | Wt 165.0 lb

## 2018-01-13 DIAGNOSIS — J449 Chronic obstructive pulmonary disease, unspecified: Secondary | ICD-10-CM | POA: Diagnosis not present

## 2018-01-13 DIAGNOSIS — J441 Chronic obstructive pulmonary disease with (acute) exacerbation: Secondary | ICD-10-CM | POA: Diagnosis not present

## 2018-01-13 DIAGNOSIS — J101 Influenza due to other identified influenza virus with other respiratory manifestations: Secondary | ICD-10-CM

## 2018-01-13 LAB — POCT INFLUENZA A/B
Influenza A, POC: POSITIVE — AB
Influenza B, POC: NEGATIVE

## 2018-01-13 MED ORDER — ALBUTEROL SULFATE HFA 108 (90 BASE) MCG/ACT IN AERS
2.0000 | INHALATION_SPRAY | Freq: Four times a day (QID) | RESPIRATORY_TRACT | 3 refills | Status: DC | PRN
Start: 2018-01-13 — End: 2021-06-21

## 2018-01-13 MED ORDER — LEVOFLOXACIN 750 MG PO TABS: 750.0000 mg | ORAL_TABLET | Freq: Every day | ORAL | 0 refills | Status: AC

## 2018-01-13 MED ORDER — PREDNISONE 20 MG PO TABS: 20.0000 mg | ORAL_TABLET | Freq: Two times a day (BID) | ORAL | 0 refills | Status: DC

## 2018-01-13 MED ORDER — OSELTAMIVIR PHOSPHATE 75 MG PO CAPS: 75.0000 mg | ORAL_CAPSULE | Freq: Two times a day (BID) | ORAL | 0 refills | Status: AC

## 2018-01-13 MED ORDER — ALBUTEROL SULFATE HFA 108 (90 BASE) MCG/ACT IN AERS: 2.0000 | INHALATION_SPRAY | Freq: Four times a day (QID) | RESPIRATORY_TRACT | 3 refills | Status: DC | PRN

## 2018-01-13 MED ORDER — PREDNISONE 20 MG PO TABS: 20.0000 mg | ORAL_TABLET | Freq: Two times a day (BID) | ORAL | 0 refills | Status: AC

## 2018-01-13 MED ORDER — OSELTAMIVIR PHOSPHATE 75 MG PO CAPS: 75.0000 mg | ORAL_CAPSULE | Freq: Two times a day (BID) | ORAL | 0 refills | Status: DC

## 2018-01-13 MED ORDER — LEVOFLOXACIN 750 MG PO TABS: 750.0000 mg | ORAL_TABLET | Freq: Every day | ORAL | 0 refills | Status: DC

## 2018-01-13 NOTE — Progress Notes (Signed)
Patient: Crystal White Female    DOB: 1941/02/06   77 y.o.   MRN: [id number] Visit Date: 01/13/2018  Today's Provider: Lelon Huh, MD   Chief Complaint  Patient presents with  . Cough   Subjective:    Cough  This is a new problem. The current episode started yesterday. The problem has been gradually worsening. The cough is non-productive. Associated symptoms include chills, ear congestion, headaches, nasal congestion, postnasal drip, rhinorrhea, a sore throat, sweats and wheezing. Pertinent negatives include no chest pain, ear pain, fever (undocumented, but pt has felt feverish), hemoptysis, rash or shortness of breath. Treatments tried: Tylenol, NyQuil. The treatment provided mild relief. Her past medical history is significant for COPD.   Woke up about 3am yesterday morning with chills and coughing, but No more short of breath than usual. Using Breo daily, but not albuterol inhaler which she hasn't had filled in over a year.   Patient Active Problem List   Diagnosis Date Noted  . Incisional hernia, without obstruction or gangrene 06/03/2016  . Osteoporosis 02/22/2016  . Musculoskeletal chest pain 07/14/2015  . Chronic respiratory failure ([place]) 07/14/2015  . Acute bronchitis 07/14/2015  . Other emphysema ([place]) 07/14/2015  . Chronic renal insufficiency 07/14/2015  . Chest pain 07/13/2015  . Multiple pulmonary nodules 06/02/2015  . Colon polyp 05/29/2015  . Hemoptysis 04/29/2015  . Sprain of ankle 04/14/2015  . Anxiety 04/14/2015  . Barrett's esophagus 04/14/2015  . Acute exacerbation of chronic obstructive airways disease ([place]) 04/14/2015  . Bursitis 04/14/2015  . Cellulitis 04/14/2015  . Claudication ([place]) 04/14/2015  . Cough 04/14/2015  . Deficiency, 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  . Clinical depression 04/14/2015  . Dizziness 04/14/2015  . Dyslipidemia 04/14/2015  . Essential (primary) hypertension 04/14/2015  . Flank pain 04/14/2015  . H/O suicide  attempt 04/14/2015  . Dysphonia 04/14/2015  . Hypercholesteremia 04/14/2015  . Cannot sleep 04/14/2015  . Malaise and fatigue 04/14/2015  . Mild cognitive impairment 04/14/2015  . Cramp in muscle 04/14/2015  . Muscle ache 04/14/2015  . Neuropathy 04/14/2015  . Adiposity 04/14/2015  . 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 Candida infection of mouth 04/14/2015  . Abnormal loss of weight 04/14/2015  . Decreased body weight 04/14/2015  . Artificial cardiac pacemaker 10/08/2014  . Apnea, sleep 10/08/2014  . COPD (chronic obstructive pulmonary disease) ([place]) 02/13/2012  . Tobacco abuse 02/13/2012  . Sinus congestion 02/13/2012  . Current tobacco use 02/13/2012  . Esophagitis, reflux 08/28/2006  . Abdominal pain, right lower quadrant 08/22/2005  . Acute appendicitis with peritoneal abscess 08/22/2005        Allergies  Allergen Reactions  . Iodinated Diagnostic Agents Other (See Comments) and Rash    Pain and burning Reaction:  Burning and redness  Pain and burning  . Other Rash    Pain and burning  . Iodine Other (See Comments)  . Statins Other (See Comments)    Reaction:  Leg cramps   . Bacitracin Swelling and Rash     Current Outpatient Medications:  .  acetaminophen (TYLENOL) 500 MG tablet, Take 500 mg by mouth every 6 (six) hours as needed., Disp: , Rfl:  .  albuterol (PROVENTIL HFA) 108 (90 Base) MCG/ACT inhaler, Inhale 2 puffs into the lungs every 6 (six) hours as needed for wheezing or shortness of breath., Disp: 3 each, Rfl: 3 .  alendronate (FOSAMAX) 70 MG tablet, Take 1 tablet (70 mg total) by mouth once a week., Disp: 12 tablet, Rfl:  3 .  ALPRAZolam (XANAX) 0.25 MG tablet, Take 1 tablet (0.25 mg total) by mouth 2 (two) times daily. (Patient taking differently: Take 0.25 mg by mouth 2 (two) times daily. ), Disp: 60 tablet, Rfl: 0 .  BREO ELLIPTA 100-25 MCG/INH AEPB, USE 1 INHALATION DAILY, Disp: 180 each, Rfl: 3 .  citalopram (CELEXA) 10 MG tablet, TAKE 1 TABLET BY MOUTH  AT  BEDTIME, Disp: 90 tablet, Rfl: 3 .  ezetimibe (ZETIA) 10 MG tablet, Take 1 tablet (10 mg total) by mouth daily., Disp: 90 tablet, Rfl: 3 .  gabapentin (NEURONTIN) 100 MG capsule, TAKE 2 CAPSULES BY MOUTH 3  TIMES DAILY, Disp: 540 capsule, Rfl: 3 .  hydrochlorothiazide (HYDRODIURIL) 25 MG tablet, TAKE 1 TABLET BY MOUTH  DAILY, Disp: 90 tablet, Rfl: 3 .  hyoscyamine (LEVBID) 0.375 MG 12 hr tablet, Take 1 tablet (0.375 mg total) by mouth 2 (two) times daily as needed for cramping. Reported on 05/03/2016, Disp: 180 tablet, Rfl: 3 .  losartan (COZAAR) 50 MG tablet, TAKE 1 TABLET BY MOUTH AT  BEDTIME, Disp: 90 tablet, Rfl: 3 .  meclizine (ANTIVERT) 25 MG tablet, Take 1 tablet (25 mg total) by mouth 2 (two) times daily as needed for dizziness., Disp: 180 tablet, Rfl: 3 .  mirtazapine (REMERON) 30 MG tablet, TAKE 1 TABLET BY MOUTH AT  BEDTIME, Disp: 90 tablet, Rfl: 3 .  OXYGEN, Inhale into the lungs daily. Uses at night with CPAP and PRN during daytime, Disp: , Rfl:  .  pantoprazole (PROTONIX) 40 MG tablet, TAKE 1 TABLET BY MOUTH TWO  TIMES DAILY, Disp: 180 tablet, Rfl: 3 .  traMADol (ULTRAM) 50 MG tablet, Take 1 tablet (50 mg total) every 8 (eight) hours as needed by mouth., Disp: 90 tablet, Rfl: 0 .  traZODone (DESYREL) 150 MG tablet, TAKE 1 TABLET BY MOUTH AT  BEDTIME, Disp: 90 tablet, Rfl: 3  Review of Systems  Constitutional: Positive for chills. Negative for fever (undocumented, but pt has felt feverish).  HENT: Positive for postnasal drip, rhinorrhea and sore throat. Negative for ear pain.   Respiratory: Positive for cough and wheezing. Negative for hemoptysis and shortness of breath.   Cardiovascular: Negative for chest pain.  Skin: Negative for rash.  Neurological: Positive for headaches.    Social History   Tobacco Use  . Smoking status: Current Every Day Smoker    Packs/day: 1.00    Years: 55.00    Pack years: 55.00    Types: Cigarettes  . Smokeless tobacco: Never Used  Substance  Use Topics  . Alcohol use: Yes    Alcohol/week: 0.0 oz    Comment: occcasionally 1-2  glass wine a month   Objective:   BP 120/76 (BP Location: Left Arm, Patient Position: Sitting, Cuff Size: Normal)   Pulse (!) 104   Temp 99.8 F (37.7 C) (Oral)   Resp 20   Wt 165 lb (74.8 kg)   SpO2 (!) 84% Comment: room air  BMI 28.32 kg/m  Vitals:   01/13/18 1006  BP: 120/76  Pulse: (!) 104  Resp: 20  Temp: 99.8 F (37.7 C)  TempSrc: Oral  SpO2: (!) 84%  Weight: 165 lb (74.8 kg)  SaO2 improved to 90% on 2lpm supplementle O2. She reports she is usually on continuous portable O2 buts she forgot to bring her O2 concentrator with her today.    Physical Exam  General Appearance:    Alert, cooperative, no distress  HENT:   ENT exam normal, no  neck nodes or sinus tenderness  Eyes:    PERRL, conjunctiva/corneas clear, EOM's intact       Lungs:     Occasional expiratory wheeze, no rales, diminished breath sounds at bases , respirations unlabored  Heart:    Regular rate and rhythm  Neurologic:   Awake, alert, oriented x 3. No apparent focal neurological           defect.       Results for orders placed or performed in visit on 01/13/18  POCT Influenza A/B  Result Value Ref Range   Influenza A, POC Positive (A) Negative   Influenza B, POC Negative Negative        Assessment & Plan:     1. Influenza A  - oseltamivir (TAMIFLU) 75 MG capsule; Take 1 capsule (75 mg total) by mouth 2 (two) times daily for 5 days.  Dispense: 10 capsule; Refill: 0  2. COPD exacerbation ([place]) She was given albuterol nebulizer in office with improvement in coughing, wheezing and dyspnea. She is to go immediately to pharmacy for prescriptions. Go to ER if sx worsening or not improving over the next 24 hours.   - predniSONE (DELTASONE) 20 MG tablet; Take 1 tablet (20 mg total) by mouth 2 (two) times daily with a meal for 7 days.  Dispense: 14 tablet; Refill: 0 - albuterol (PROVENTIL HFA) 108 (90 Base) MCG/ACT  inhaler; Inhale 2 puffs into the lungs every 6 (six) hours as needed for wheezing or shortness of breath.  Dispense: 3 each; Refill: 3 - levofloxacin (LEVAQUIN) 750 MG tablet; Take 1 tablet (750 mg total) by mouth daily for 7 days.  Dispense: 7 tablet; Refill: 0       Lelon Huh, MD  [facility]

## 2018-01-25 ENCOUNTER — Encounter: Payer: Self-pay | Admitting: Family Medicine

## 2018-01-25 ENCOUNTER — Ambulatory Visit (INDEPENDENT_AMBULATORY_CARE_PROVIDER_SITE_OTHER): Payer: Medicare Other | Admitting: Family Medicine

## 2018-01-25 VITALS — BP 120/80 | HR 88 | Temp 98.5°F | Resp 16 | Wt 163.0 lb

## 2018-01-25 DIAGNOSIS — J441 Chronic obstructive pulmonary disease with (acute) exacerbation: Secondary | ICD-10-CM

## 2018-01-25 MED ORDER — AZITHROMYCIN 250 MG PO TABS: ORAL_TABLET | ORAL | 0 refills | Status: AC

## 2018-01-25 MED ORDER — TIOTROPIUM BROMIDE MONOHYDRATE 2.5 MCG/ACT IN AERS: 2.0000 | INHALATION_SPRAY | Freq: Every day | RESPIRATORY_TRACT | 6 refills | Status: DC

## 2018-01-25 MED ORDER — PREDNISONE 20 MG PO TABS: 20.0000 mg | ORAL_TABLET | Freq: Two times a day (BID) | ORAL | 0 refills | Status: AC

## 2018-01-25 NOTE — Progress Notes (Signed)
Patient: Crystal White Female    DOB: 1941-10-21   77 y.o.   MRN: [id number] Visit Date: 01/25/2018  Today's Provider: Lelon Huh, MD   Chief Complaint  Patient presents with  . Shortness of Breath   Subjective:    HPI  COPD exacerbation (HCC) Was seen 01/13/2018 and given albuterol nebulizer in office with improvement in coughing, wheezing and dyspnea. Flu a was positive and she was treated with Tamiflu and 7 days prednisone and levofloxacin. Patient states she is on oxygen at home, however she states she will be completely out of breath just walking to the bathroom. Patient states her 02 drops down in the 80's.  States she started feeling much better 3-4 days after last visit and stared getting out of the house for a few days.  Coughing up a lot phlegm and mucous turning yellow today  Allergies  Allergen Reactions  . Iodinated Diagnostic Agents Other (See Comments) and Rash    Pain and burning Reaction:  Burning and redness  Pain and burning  . Other Rash    Pain and burning  . Iodine Other (See Comments)  . Statins Other (See Comments)    Reaction:  Leg cramps   . Bacitracin Swelling and Rash     Current Outpatient Medications:  .  acetaminophen (TYLENOL) 500 MG tablet, Take 500 mg by mouth every 6 (six) hours as needed., Disp: , Rfl:  .  albuterol (PROVENTIL HFA) 108 (90 Base) MCG/ACT inhaler, Inhale 2 puffs into the lungs every 6 (six) hours as needed for wheezing or shortness of breath., Disp: 3 each, Rfl: 3 .  alendronate (FOSAMAX) 70 MG tablet, Take 1 tablet (70 mg total) by mouth once a week., Disp: 12 tablet, Rfl: 3 .  ALPRAZolam (XANAX) 0.25 MG tablet, Take 1 tablet (0.25 mg total) by mouth 2 (two) times daily. (Patient taking differently: Take 0.25 mg by mouth 2 (two) times daily. ), Disp: 60 tablet, Rfl: 0 .  BREO ELLIPTA 100-25 MCG/INH AEPB, USE 1 INHALATION DAILY, Disp: 180 each, Rfl: 3 .  citalopram (CELEXA) 10 MG tablet, TAKE 1 TABLET BY MOUTH AT   BEDTIME, Disp: 90 tablet, Rfl: 3 .  ezetimibe (ZETIA) 10 MG tablet, Take 1 tablet (10 mg total) by mouth daily., Disp: 90 tablet, Rfl: 3 .  gabapentin (NEURONTIN) 100 MG capsule, TAKE 2 CAPSULES BY MOUTH 3  TIMES DAILY, Disp: 540 capsule, Rfl: 3 .  hydrochlorothiazide (HYDRODIURIL) 25 MG tablet, TAKE 1 TABLET BY MOUTH  DAILY, Disp: 90 tablet, Rfl: 3 .  hyoscyamine (LEVBID) 0.375 MG 12 hr tablet, Take 1 tablet (0.375 mg total) by mouth 2 (two) times daily as needed for cramping. Reported on 05/03/2016, Disp: 180 tablet, Rfl: 3 .  losartan (COZAAR) 50 MG tablet, TAKE 1 TABLET BY MOUTH AT  BEDTIME, Disp: 90 tablet, Rfl: 3 .  meclizine (ANTIVERT) 25 MG tablet, Take 1 tablet (25 mg total) by mouth 2 (two) times daily as needed for dizziness., Disp: 180 tablet, Rfl: 3 .  mirtazapine (REMERON) 30 MG tablet, TAKE 1 TABLET BY MOUTH AT  BEDTIME, Disp: 90 tablet, Rfl: 3 .  OXYGEN, Inhale into the lungs daily. Uses at night with CPAP and PRN during daytime, Disp: , Rfl:  .  pantoprazole (PROTONIX) 40 MG tablet, TAKE 1 TABLET BY MOUTH TWO  TIMES DAILY, Disp: 180 tablet, Rfl: 3 .  traMADol (ULTRAM) 50 MG tablet, Take 1 tablet (50 mg total) every 8 (eight)  hours as needed by mouth., Disp: 90 tablet, Rfl: 0 .  traZODone (DESYREL) 150 MG tablet, TAKE 1 TABLET BY MOUTH AT  BEDTIME, Disp: 90 tablet, Rfl: 3  Review of Systems  Constitutional: Negative for appetite change, chills, fatigue and fever.  Respiratory: Positive for shortness of breath. Negative for chest tightness.   Cardiovascular: Negative for chest pain and palpitations.  Gastrointestinal: Negative for abdominal pain, nausea and vomiting.  Neurological: Negative for dizziness and weakness.    Social History   Tobacco Use  . Smoking status: Current Every Day Smoker    Packs/day: 1.00    Years: 55.00    Pack years: 55.00    Types: Cigarettes  . Smokeless tobacco: Never Used  Substance Use Topics  . Alcohol use: Yes    Alcohol/week: 0.0 oz     Comment: occcasionally 1-2  glass wine a month   Objective:   BP 120/80 (BP Location: Left Arm, Patient Position: Sitting, Cuff Size: Normal)   Pulse 90   Temp 98.5 F (36.9 C) (Oral)   Resp 16   Wt 163 lb (73.9 kg)   SpO2 (!) 88%   BMI 27.98 kg/m  Vitals:   01/25/18 1156  BP: 120/80  Pulse: 90  Resp: 16  Temp: 98.5 F (36.9 C)  TempSrc: Oral  SpO2: (!) 88%  Weight: 163 lb (73.9 kg)   O2 increases to 95% after putting on 2lpm oxygen  Physical Exam   General Appearance:    Alert, cooperative, no distress  Eyes:    PERRL, conjunctiva/corneas clear, EOM's intact       Lungs:     Scattered expiratory wheezes, no rales or rhonci, respirations unlabored  Heart:    Regular rate and rhythm  Neurologic:   Awake, alert, oriented x 3. No apparent focal neurological           defect.           Assessment & Plan:     1. COPD exacerbation (HCC) Add  Tiotropium Bromide Monohydrate (SPIRIVA RESPIMAT) 2.5 MCG/ACT AERS; Inhale 2 puffs into the lungs daily.  Dispense: 1 Inhaler; Refill: 6  - azithromycin (ZITHROMAX) 250 MG tablet; 2 by mouth today, then 1 daily for 4 days  Dispense: 6 tablet; Refill: 0 - predniSONE (DELTASONE) 20 MG tablet; Take 1 tablet (20 mg total) by mouth 2 (two) times daily with a meal for 5 days.  Dispense: 10 tablet; Refill: 0  Consider early follow up back with pulmonary if not much improved with addition Spiriva. Follow up Dr. Rosanna Randy in april as scheduled.       Lelon Huh, MD  [facility]

## 2018-01-25 NOTE — Patient Instructions (Signed)
   Take 2 puffs of Spiriva every day, In addition to [facility]   Use the albuterol every six hours AS NEEDED for cough or shortness of breath

## 2018-02-10 DIAGNOSIS — J449 Chronic obstructive pulmonary disease, unspecified: Secondary | ICD-10-CM | POA: Diagnosis not present

## 2018-02-20 ENCOUNTER — Ambulatory Visit (INDEPENDENT_AMBULATORY_CARE_PROVIDER_SITE_OTHER): Payer: Medicare Other | Admitting: Family Medicine

## 2018-02-20 ENCOUNTER — Encounter: Payer: Self-pay | Admitting: Family Medicine

## 2018-02-20 VITALS — BP 128/80 | HR 84 | Temp 98.7°F | Resp 20 | Wt 158.0 lb

## 2018-02-20 DIAGNOSIS — Z720 Tobacco use: Secondary | ICD-10-CM | POA: Diagnosis not present

## 2018-02-20 DIAGNOSIS — J441 Chronic obstructive pulmonary disease with (acute) exacerbation: Secondary | ICD-10-CM

## 2018-02-20 DIAGNOSIS — Z716 Tobacco abuse counseling: Secondary | ICD-10-CM

## 2018-02-20 MED ORDER — PREDNISONE 10 MG (21) PO TBPK: ORAL_TABLET | ORAL | 0 refills | Status: DC

## 2018-02-20 MED ORDER — NICOTINE 14 MG/24HR TD PT24: 14.0000 mg | MEDICATED_PATCH | Freq: Every day | TRANSDERMAL | 3 refills | Status: DC

## 2018-02-20 MED ORDER — DOXYCYCLINE HYCLATE 100 MG PO TABS: 100.0000 mg | ORAL_TABLET | Freq: Two times a day (BID) | ORAL | 0 refills | Status: DC

## 2018-02-20 NOTE — Progress Notes (Signed)
Patient: Crystal White Female    DOB: 07-11-1941   77 y.o.   MRN: [id number] Visit Date: 02/20/2018  Today's Provider: [name], MD   Chief Complaint  Patient presents with  . URI   Subjective:    HPI Upper Respiratory Infection: Patient complains of symptoms of a URI, possible sinusitis. Symptoms include congestion and cough. Onset of symptoms was 5 weeks ago, gradually worsening since that time. She also c/o congestion, cough described as productive, productive cough with  yellow colored sputum, shortness of breath and wheezing for the past 5 weeks .  She is drinking plenty of fluids. Evaluation to date: none seen previously and thought to have a viral URI. Treatment to date: [place] and [place]. Patient reports she has not used Albuterol in the last few days even when she has been wheezing. Patient reports cough is waking her up during the night.   Patient requesting refill on Xanax, patient reports she is using Xanax as needed.    Allergies  Allergen Reactions  . Iodinated Diagnostic Agents Other (See Comments) and Rash    Pain and burning Reaction:  Burning and redness  Pain and burning  . Other Rash    Pain and burning  . Iodine Other (See Comments)  . Statins Other (See Comments)    Reaction:  Leg cramps   . Bacitracin Swelling and Rash     Current Outpatient Medications:  .  acetaminophen (TYLENOL) 500 MG tablet, Take 500 mg by mouth every 6 (six) hours as needed., Disp: , Rfl:  .  albuterol (PROVENTIL HFA) 108 (90 Base) MCG/ACT inhaler, Inhale 2 puffs into the lungs every 6 (six) hours as needed for wheezing or shortness of breath., Disp: 3 each, Rfl: 3 .  alendronate (FOSAMAX) 70 MG tablet, Take 1 tablet (70 mg total) by mouth once a week., Disp: 12 tablet, Rfl: 3 .  ALPRAZolam (XANAX) 0.25 MG tablet, Take 1 tablet (0.25 mg total) by mouth 2 (two) times daily. (Patient taking differently: Take 0.25 mg by mouth 2 (two) times daily. ), Disp: 60 tablet,  Rfl: 0 .  BREO ELLIPTA 100-25 MCG/INH AEPB, USE 1 INHALATION DAILY, Disp: 180 each, Rfl: 3 .  citalopram (CELEXA) 10 MG tablet, TAKE 1 TABLET BY MOUTH AT  BEDTIME, Disp: 90 tablet, Rfl: 3 .  ezetimibe (ZETIA) 10 MG tablet, Take 1 tablet (10 mg total) by mouth daily., Disp: 90 tablet, Rfl: 3 .  gabapentin (NEURONTIN) 100 MG capsule, TAKE 2 CAPSULES BY MOUTH 3  TIMES DAILY, Disp: 540 capsule, Rfl: 3 .  hydrochlorothiazide (HYDRODIURIL) 25 MG tablet, TAKE 1 TABLET BY MOUTH  DAILY, Disp: 90 tablet, Rfl: 3 .  hyoscyamine (LEVBID) 0.375 MG 12 hr tablet, Take 1 tablet (0.375 mg total) by mouth 2 (two) times daily as needed for cramping. Reported on 05/03/2016, Disp: 180 tablet, Rfl: 3 .  losartan (COZAAR) 50 MG tablet, TAKE 1 TABLET BY MOUTH AT  BEDTIME, Disp: 90 tablet, Rfl: 3 .  meclizine (ANTIVERT) 25 MG tablet, Take 1 tablet (25 mg total) by mouth 2 (two) times daily as needed for dizziness., Disp: 180 tablet, Rfl: 3 .  mirtazapine (REMERON) 30 MG tablet, TAKE 1 TABLET BY MOUTH AT  BEDTIME, Disp: 90 tablet, Rfl: 3 .  OXYGEN, Inhale into the lungs daily. Uses at night with CPAP and PRN during daytime, Disp: , Rfl:  .  pantoprazole (PROTONIX) 40 MG tablet, TAKE 1 TABLET BY MOUTH TWO  TIMES DAILY, Disp: 180 tablet, Rfl: 3 .  Tiotropium Bromide Monohydrate (SPIRIVA RESPIMAT) 2.5 MCG/ACT AERS, Inhale 2 puffs into the lungs daily., Disp: 1 Inhaler, Rfl: 6 .  traMADol (ULTRAM) 50 MG tablet, Take 1 tablet (50 mg total) every 8 (eight) hours as needed by mouth., Disp: 90 tablet, Rfl: 0 .  traZODone (DESYREL) 150 MG tablet, TAKE 1 TABLET BY MOUTH AT  BEDTIME, Disp: 90 tablet, Rfl: 3  Review of Systems  Constitutional: Negative.   HENT: Negative.   Eyes: Negative.   Respiratory: Positive for cough, shortness of breath and wheezing.   Gastrointestinal: Negative.   Endocrine: Negative.   Allergic/Immunologic: Negative.   Neurological: Negative.   Psychiatric/Behavioral: Negative.     Social History    Tobacco Use  . Smoking status: Current Every Day Smoker    Packs/day: 1.00    Years: 55.00    Pack years: 55.00    Types: Cigarettes  . Smokeless tobacco: Never Used  Substance Use Topics  . Alcohol use: Yes    Alcohol/week: 0.0 oz    Comment: occcasionally 1-2  glass wine a month   Objective:   BP 128/80 (BP Location: Right Arm, Patient Position: Sitting, Cuff Size: Normal)   Pulse 84   Temp 98.7 F (37.1 C) (Oral)   Resp 20   Wt 158 lb (71.7 kg)   SpO2 90%   BMI 27.12 kg/m  Vitals:   02/20/18 1341  BP: 128/80  Pulse: 84  Resp: 20  Temp: 98.7 F (37.1 C)  TempSrc: Oral  SpO2: 90%  Weight: 158 lb (71.7 kg)     Physical Exam  Constitutional: She is oriented to person, place, and time. She appears well-developed and well-nourished.  HENT:  Head: Normocephalic and atraumatic.  Eyes: Conjunctivae are normal. No scleral icterus.  Neck: No thyromegaly present.  Cardiovascular: Normal rate, regular rhythm and normal heart sounds.  Pulmonary/Chest: Effort normal and breath sounds normal.  Abdominal: Soft.  Neurological: She is alert and oriented to person, place, and time.  Skin: Skin is warm and dry.  Psychiatric: She has a normal mood and affect. Her behavior is normal. Judgment and thought content normal.        Assessment & Plan:     1. COPD exacerbation (HCC) Recurrent. Worsening. Patient started on doxycycline and prednisone as below. Patient advised to quit smoking to help with symptoms.  - doxycycline (VIBRA-TABS) 100 MG tablet; Take 1 tablet (100 mg total) by mouth 2 (two) times daily.  Dispense: 20 tablet; Refill: 0 - predniSONE (STERAPRED UNI-PAK 21 TAB) 10 MG (21) TBPK tablet; Day one take 6 tablets, day 2 take 5 tablets, day 3 take 4 tablets, day 4 take 3 tablets, day 5 take 2 tablet, and day 6 take 1 tablet.  Dispense: 21 tablet; Refill: 0  2. Current tobacco use Patient started on nicoderm patch as below. Patient to follow up in 3 months.  -  nicotine (NICODERM CQ) 14 mg/24hr patch; Place 1 patch (14 mg total) onto the skin daily.  Dispense: 30 patch; Refill: 3  3. Encounter for smoking cessation counseling As above.      I have done the exam and reviewed the above chart and it is accurate to the best of my knowledge. Development worker, community has been used in this note in any air is in the dictation or transcription are unintentional.  [name], MD  [place]

## 2018-03-06 ENCOUNTER — Ambulatory Visit: Payer: Self-pay | Admitting: Family Medicine

## 2018-03-13 DIAGNOSIS — J449 Chronic obstructive pulmonary disease, unspecified: Secondary | ICD-10-CM | POA: Diagnosis not present

## 2018-04-12 DIAGNOSIS — J449 Chronic obstructive pulmonary disease, unspecified: Secondary | ICD-10-CM | POA: Diagnosis not present

## 2018-04-17 ENCOUNTER — Ambulatory Visit (INDEPENDENT_AMBULATORY_CARE_PROVIDER_SITE_OTHER): Payer: Medicare Other

## 2018-04-17 VITALS — BP 130/72 | HR 90 | Temp 98.2°F | Ht 64.0 in | Wt 160.2 lb

## 2018-04-17 DIAGNOSIS — Z0000 Encounter for general adult medical examination without abnormal findings: Secondary | ICD-10-CM | POA: Diagnosis not present

## 2018-04-17 NOTE — Progress Notes (Signed)
Subjective:   Crystal White is a 77 y.o. female who presents for Medicare Annual (Subsequent) preventive examination.  Review of Systems:  N/A  Cardiac Risk Factors include: advanced age (>42men, >76 women);smoking/ tobacco exposure;hypertension     Objective:     Vitals: BP 130/72 (BP Location: Right Arm)   Pulse 90   Temp 98.2 F (36.8 C) (Oral)   Ht 5\' 4"  (1.626 m)   Wt 160 lb 3.2 oz (72.7 kg)   BMI 27.50 kg/m   Body mass index is 27.5 kg/m.  Advanced Directives 04/17/2018 07/05/2017 03/28/2017 05/26/2016 05/03/2016 02/22/2016 07/13/2015  Does Patient Have a Medical Advance Directive? Yes Yes Yes No Yes Yes -  Type of Paramedic of [place];Living will - [place];Living will - [place];Living will Living will;Healthcare Power of [place];Living will  Does patient want to make changes to medical advance directive? - - - - No - Patient declined Yes - information given No - Patient declined  Copy of [place] in Chart? No - copy requested - No - copy requested - No - copy requested - -  Would patient like information on creating a medical advance directive? - - - - - - -    Tobacco Social History   Tobacco Use  Smoking Status Current Every Day Smoker  . Packs/day: 1.00  . Years: 55.00  . Pack years: 55.00  . Types: Cigarettes  Smokeless Tobacco Never Used     Ready to quit: No Counseling given: No   Clinical Intake:  Pre-visit preparation completed: Yes  Pain : 0-10 Pain Score: 3  Pain Type: Acute pain Pain Location: Shoulder Pain Orientation: Left Pain Descriptors / Indicators: Dull, Aching Pain Onset: Today     Nutritional Status: BMI 25 -29 Overweight Nutritional Risks: Nausea/ vomitting/ diarrhea(diarrhea intermittenly due to medication) Diabetes: No  How often do you need to have someone help you when you read instructions, pamphlets, or other  written materials from your doctor or pharmacy?: 1 - Never  Interpreter Needed?: No  Information entered by :: [facility], LPN  Past Medical History:  Diagnosis Date  . Arthritis    hands, lower back  . Barrett's esophagus   . COPD (chronic obstructive pulmonary disease) ([place])   . Emphysema lung ([place])   . GERD (gastroesophageal reflux disease)   . Hernia of abdominal cavity   . Hyperlipidemia   . Hypertension   . Mobitz type II atrioventricular block   . OSA (obstructive sleep apnea)    on CPAP  . Peripheral neuropathy   . Presence of permanent cardiac pacemaker 09/22/11   Biotronik - EVIADR-T [facility])  . Shortness of breath dyspnea   . Stomach ulcer    Past Surgical History:  Procedure Laterality Date  . APPENDECTOMY  2006   Dr. Pat Patrick  . BROW LIFT Bilateral 05/03/2016   Procedure: BLEPHAROPLASTY  BILATERAL UPPER EYELIDS WITH EXCESS SKIN REMOVAL BILATERAL BROW PTOSIS REPAIR BLEPHAROPTOSIS REPAIR BILATERAL EYES ;  Surgeon: Karle Starch, MD;  Location: [place];  Service: Ophthalmology;  Laterality: Bilateral;  . CARDIAC CATHETERIZATION  2007  . COLON SURGERY    . COLONOSCOPY WITH PROPOFOL N/A 04/14/2015   Procedure: COLONOSCOPY WITH PROPOFOL;  Surgeon: Lollie Sails, MD;  [facility] ENDOSCOPY;  Service: Endoscopy;  Laterality: N/A;  . ESOPHAGOGASTRODUODENOSCOPY N/A 04/14/2015   Procedure: ESOPHAGOGASTRODUODENOSCOPY (EGD);  Surgeon: Lollie Sails, MD;  Location: [facility]  ENDOSCOPY;  Service: Endoscopy;  Laterality: N/A;  . ESOPHAGOGASTRODUODENOSCOPY (EGD) WITH PROPOFOL N/A 07/05/2017   Procedure: ESOPHAGOGASTRODUODENOSCOPY (EGD) WITH PROPOFOL;  Surgeon: Manya Silvas, MD;  Location: [facility] ENDOSCOPY;  Service: Endoscopy;  Laterality: N/A;  . HIATAL HERNIA REPAIR  2007  . LAPAROSCOPIC RIGHT HEMI COLECTOMY Right 06/22/2015   Procedure: LAPAROSCOPIC RIGHT HEMI COLECTOMY;  Surgeon: Marlyce Huge, MD;  Location: [facility] ORS;  Service: General;  Laterality: Right;   . NISSEN FUNDOPLICATION  3818  . PACEMAKER INSERTION  09/22/11   [facility]  - Biotronik EVIADR-T  . TOTAL ABDOMINAL HYSTERECTOMY  1980   Family History  Adopted: Yes  Problem Relation Age of Onset  . Diabetes Son    Social History   Socioeconomic History  . Marital status: Widowed    Spouse name: widowed  . Number of children: 2  . Years of education: Not on file  . Highest education level: Some college, no degree  Occupational History  . Occupation: retired Nurse, adult  . Financial resource strain: Not hard at all  . Food insecurity:    Worry: Never true    Inability: Never true  . Transportation needs:    Medical: No    Non-medical: No  Tobacco Use  . Smoking status: Current Every Day Smoker    Packs/day: 1.00    Years: 55.00    Pack years: 55.00    Types: Cigarettes  . Smokeless tobacco: Never Used  Substance and Sexual Activity  . Alcohol use: Not Currently    Alcohol/week: 0.0 oz  . Drug use: No  . Sexual activity: Never  Lifestyle  . Physical activity:    Days per week: Not on file    Minutes per session: Not on file  . Stress: Only a little  Relationships  . Social connections:    Talks on phone: Not on file    Gets together: Not on file    Attends religious service: Not on file    Active member of club or organization: Not on file    Attends meetings of clubs or organizations: Not on file    Relationship status: Not on file  Other Topics Concern  . Not on file  Social History Narrative   Pt was adopted.    Lives at home by herself.   Not on home oxygen.    Outpatient Encounter Medications as of 04/17/2018  Medication Sig  . acetaminophen (TYLENOL) 500 MG tablet Take 500 mg by mouth every 6 (six) hours as needed.  Marland Kitchen albuterol (PROVENTIL HFA) 108 (90 Base) MCG/ACT inhaler Inhale 2 puffs into the lungs every 6 (six) hours as needed for wheezing or shortness of breath.  Marland Kitchen alendronate (FOSAMAX) 70 MG tablet Take 1 tablet (70 mg total) by  mouth once a week.  . ALPRAZolam (XANAX) 0.25 MG tablet Take 1 tablet (0.25 mg total) by mouth 2 (two) times daily. (Patient taking differently: Take 0.25 mg by mouth 2 (two) times daily. )  . BREO ELLIPTA 100-25 MCG/INH AEPB USE 1 INHALATION DAILY  . citalopram (CELEXA) 10 MG tablet TAKE 1 TABLET BY MOUTH AT  BEDTIME  . ezetimibe (ZETIA) 10 MG tablet Take 1 tablet (10 mg total) by mouth daily.  Marland Kitchen gabapentin (NEURONTIN) 100 MG capsule TAKE 2 CAPSULES BY MOUTH 3  TIMES DAILY  . hydrochlorothiazide (HYDRODIURIL) 25 MG tablet TAKE 1 TABLET BY MOUTH  DAILY  . hyoscyamine (LEVBID) 0.375 MG 12 hr tablet Take 1 tablet (0.375 mg total)  by mouth 2 (two) times daily as needed for cramping. Reported on 05/03/2016  . losartan (COZAAR) 50 MG tablet TAKE 1 TABLET BY MOUTH AT  BEDTIME  . meclizine (ANTIVERT) 25 MG tablet Take 1 tablet (25 mg total) by mouth 2 (two) times daily as needed for dizziness.  . mirtazapine (REMERON) 30 MG tablet TAKE 1 TABLET BY MOUTH AT  BEDTIME  . OXYGEN Inhale into the lungs daily. Uses at night with CPAP and PRN during daytime  . pantoprazole (PROTONIX) 40 MG tablet TAKE 1 TABLET BY MOUTH TWO  TIMES DAILY  . Tiotropium Bromide Monohydrate (SPIRIVA RESPIMAT) 2.5 MCG/ACT AERS Inhale 2 puffs into the lungs daily.  . traMADol (ULTRAM) 50 MG tablet Take 1 tablet (50 mg total) every 8 (eight) hours as needed by mouth.  . traZODone (DESYREL) 150 MG tablet TAKE 1 TABLET BY MOUTH AT  BEDTIME  . doxycycline (VIBRA-TABS) 100 MG tablet Take 1 tablet (100 mg total) by mouth 2 (two) times daily.  . nicotine (NICODERM CQ) 14 mg/24hr patch Place 1 patch (14 mg total) onto the skin daily. (Patient not taking: Reported on 04/17/2018)  . predniSONE (STERAPRED UNI-PAK 21 TAB) 10 MG (21) TBPK tablet Day one take 6 tablets, day 2 take 5 tablets, day 3 take 4 tablets, day 4 take 3 tablets, day 5 take 2 tablet, and day 6 take 1 tablet.   No facility-administered encounter medications on file as of  04/17/2018.     Activities of Daily Living In your present state of health, do you have any difficulty performing the following activities: 04/17/2018  Hearing? N  Vision? N  Difficulty concentrating or making decisions? N  Walking or climbing stairs? Y  Comment Due to leg pain.  Dressing or bathing? N  Doing errands, shopping? N  Comment Not far distances.   Preparing Food and eating ? N  Using the Toilet? N  In the past six months, have you accidently leaked urine? Y  Comment Yes, wears pads daily.  Do you have problems with loss of bowel control? N  Managing your Medications? N  Managing your Finances? N  Housekeeping or managing your Housekeeping? N  Some recent data might be hidden    Patient Care Team: Jerrol Banana., MD as PCP - General (Unknown Physician Specialty) Laverle Hobby, MD as Consulting Physician (Pulmonary Disease) Samara Deist, DPM as Referring Physician (Podiatry) Lollie Sails, MD as Consulting Physician (Gastroenterology)    Assessment:   This is a routine wellness examination for [place].  Exercise Activities and Dietary recommendations Current Exercise Habits: The patient does not participate in regular exercise at present, Exercise limited by: orthopedic condition(s)  Goals    . DIET - REDUCE FAT INTAKE     Recommend cutting fat intake in half to help aid in weight loss.        Fall Risk Fall Risk  04/17/2018 03/28/2017 02/22/2016 10/19/2015 08/10/2015  Falls in the past year? No No No No No   Is the patient's home free of loose throw rugs in walkways, pet beds, electrical cords, etc?   yes      Grab bars in the bathroom? yes      Handrails on the stairs?   yes      Adequate lighting?   yes  Timed Get Up and Go performed: N/A  Depression Screen PHQ 2/9 Scores 04/17/2018 04/17/2018 03/28/2017 03/28/2017  PHQ - 2 Score 0 0 0 0  PHQ- 9 Score 9 -  9 -     Cognitive Function     6CIT Screen 04/17/2018 03/28/2017  What Year? 0  points 0 points  What month? 0 points 0 points  What time? 0 points 0 points  Count back from 20 0 points 0 points  Months in reverse 2 points 2 points  Repeat phrase 0 points 0 points  Total Score 2 2    Immunization History  Administered Date(s) Administered  . Influenza Split 09/20/2006, 09/02/2010, 09/10/2012  . Influenza Whole 08/28/2012  . Influenza, High Dose Seasonal PF 08/28/2014, 08/10/2015, 07/25/2016, 10/03/2017  . Influenza-Unspecified 08/28/2013  . Pneumococcal Conjugate-13 08/28/2014  . Pneumococcal Polysaccharide-23 03/07/2011  . Tdap 08/28/2014  . Zoster 11/04/2012    Qualifies for Shingles Vaccine? Due for Shingles vaccine. Declined my offer to administer today. Education has been provided regarding the importance of this vaccine. Pt has been advised to call her insurance company to determine her out of pocket expense. Advised she may also receive this vaccine at her local pharmacy or Health Dept. Verbalized acceptance and understanding.  Screening Tests Health Maintenance  Topic Date Due  . INFLUENZA VACCINE  06/28/2018  . TETANUS/TDAP  08/28/2024  . COLONOSCOPY  04/13/2025  . DEXA SCAN  Completed  . PNA vac Low Risk Adult  Completed    Cancer Screenings: Lung: Low Dose CT Chest recommended if Age 12-80 years, 30 pack-year currently smoking OR have quit w/in 15years. Patient does not qualify, completed 09/2017. Breast:  Up to date on Mammogram? Yes   Up to date of Bone Density/Dexa? Yes Colorectal: Up to date  Additional Screenings:  Hepatitis C Screening: N/A     Plan:  I have personally reviewed and addressed the Medicare Annual Wellness questionnaire and have noted the following in the patient's chart:  A. Medical and social history B. Use of alcohol, tobacco or illicit drugs  C. Current medications and supplements D. Functional ability and status E.  Nutritional status F.  Physical activity G. Advance directives H. List of other  physicians I.  Hospitalizations, surgeries, and ER visits in previous 12 months J.  [place] such as hearing and vision if needed, cognitive and depression L. Referrals and appointments - none  In addition, I have reviewed and discussed with patient certain preventive protocols, quality metrics, and best practice recommendations. A written personalized care plan for preventive services as well as general preventive health recommendations were provided to patient.  See attached scanned questionnaire for additional information.   Signed,  Fabio Neighbors, LPN Nurse Health Advisor   Nurse Recommendations: None.

## 2018-04-17 NOTE — Patient Instructions (Signed)
Crystal White , Thank you for taking time to come for your Medicare Wellness Visit. I appreciate your ongoing commitment to your health goals. Please review the following plan we discussed and let me know if I can assist you in the future.   Screening recommendations/referrals: Colonoscopy: Up to date Mammogram: Up to date Bone Density: Up to date Recommended yearly ophthalmology/optometry visit for glaucoma screening and checkup Recommended yearly dental visit for hygiene and checkup  Vaccinations: Influenza vaccine: Up to date Pneumococcal vaccine: Up to date Tdap vaccine: Up to date Shingles vaccine: Pt declines today.     Advanced directives: Please bring a copy of your POA (Power of Attorney) and/or Living Will to your next appointment.   Conditions/risks identified: Recommend cutting fat intake in half to help aid in weight loss.   Next appointment: 05/24/18 @ 9:40 AM with Dr Rosanna Randy.    Preventive Care 39 Years and Older, Female Preventive care refers to lifestyle choices and visits with your health care provider that can promote health and wellness. What does preventive care include?  A yearly physical exam. This is also called an annual well check.  Dental exams once or twice a year.  Routine eye exams. Ask your health care provider how often you should have your eyes checked.  Personal lifestyle choices, including:  Daily care of your teeth and gums.  Regular physical activity.  Eating a healthy diet.  Avoiding tobacco and drug use.  Limiting alcohol use.  Practicing safe sex.  Taking low-dose aspirin every day.  Taking vitamin and mineral supplements as recommended by your health care provider. What happens during an annual well check? The services and screenings done by your health care provider during your annual well check will depend on your age, overall health, lifestyle risk factors, and family history of disease. Counseling  Your health care provider  may ask you questions about your:  Alcohol use.  Tobacco use.  Drug use.  Emotional well-being.  Home and relationship well-being.  Sexual activity.  Eating habits.  History of falls.  Memory and ability to understand (cognition).  Work and work Statistician.  Reproductive health. Screening  You may have the following tests or measurements:  Height, weight, and BMI.  Blood pressure.  Lipid and cholesterol levels. These may be checked every 5 years, or more frequently if you are over 6 years old.  Skin check.  Lung cancer screening. You may have this screening every year starting at age 40 if you have a 30-pack-year history of smoking and currently smoke or have quit within the past 15 years.  Fecal occult blood test (FOBT) of the stool. You may have this test every year starting at age 21.  Flexible sigmoidoscopy or colonoscopy. You may have a sigmoidoscopy every 5 years or a colonoscopy every 10 years starting at age 31.  Hepatitis C blood test.  Hepatitis B blood test.  Sexually transmitted disease (STD) testing.  Diabetes screening. This is done by checking your blood sugar (glucose) after you have not eaten for a while (fasting). You may have this done every 1-3 years.  Bone density scan. This is done to screen for osteoporosis. You may have this done starting at age 67.  Mammogram. This may be done every 1-2 years. Talk to your health care provider about how often you should have regular mammograms. Talk with your health care provider about your test results, treatment options, and if necessary, the need for more tests. Vaccines  Your health  care provider may recommend certain vaccines, such as:  Influenza vaccine. This is recommended every year.  Tetanus, diphtheria, and acellular pertussis (Tdap, Td) vaccine. You may need a Td booster every 10 years.  Zoster vaccine. You may need this after age 42.  Pneumococcal 13-valent conjugate (PCV13) vaccine.  One dose is recommended after age 70.  Pneumococcal polysaccharide (PPSV23) vaccine. One dose is recommended after age 87. Talk to your health care provider about which screenings and vaccines you need and how often you need them. This information is not intended to replace advice given to you by your health care provider. Make sure you discuss any questions you have with your health care provider. Document Released: 12/11/2015 Document Revised: 08/03/2016 Document Reviewed: 09/15/2015 Elsevier Interactive Patient Education  2017 [employer] Prevention in the Home Falls can cause injuries. They can happen to people of all ages. There are many things you can do to make your home safe and to help prevent falls. What can I do on the outside of my home?  Regularly fix the edges of walkways and driveways and fix any cracks.  Remove anything that might make you trip as you walk through a door, such as a raised step or threshold.  Trim any bushes or trees on the path to your home.  Use bright outdoor lighting.  Clear any walking paths of anything that might make someone trip, such as rocks or tools.  Regularly check to see if handrails are loose or broken. Make sure that both sides of any steps have handrails.  Any raised decks and porches should have guardrails on the edges.  Have any leaves, snow, or ice cleared regularly.  Use sand or salt on walking paths during winter.  Clean up any spills in your garage right away. This includes oil or grease spills. What can I do in the bathroom?  Use night lights.  Install grab bars by the toilet and in the tub and shower. Do not use towel bars as grab bars.  Use non-skid mats or decals in the tub or shower.  If you need to sit down in the shower, use a plastic, non-slip stool.  Keep the floor dry. Clean up any water that spills on the floor as soon as it happens.  Remove soap buildup in the tub or shower regularly.  Attach bath  mats securely with double-sided non-slip rug tape.  Do not have throw rugs and other things on the floor that can make you trip. What can I do in the bedroom?  Use night lights.  Make sure that you have a light by your bed that is easy to reach.  Do not use any sheets or blankets that are too big for your bed. They should not hang down onto the floor.  Have a firm chair that has side arms. You can use this for support while you get dressed.  Do not have throw rugs and other things on the floor that can make you trip. What can I do in the kitchen?  Clean up any spills right away.  Avoid walking on wet floors.  Keep items that you use a lot in easy-to-reach places.  If you need to reach something above you, use a strong step stool that has a grab bar.  Keep electrical cords out of the way.  Do not use floor polish or wax that makes floors slippery. If you must use wax, use non-skid floor wax.  Do not have  throw rugs and other things on the floor that can make you trip. What can I do with my stairs?  Do not leave any items on the stairs.  Make sure that there are handrails on both sides of the stairs and use them. Fix handrails that are broken or loose. Make sure that handrails are as long as the stairways.  Check any carpeting to make sure that it is firmly attached to the stairs. Fix any carpet that is loose or worn.  Avoid having throw rugs at the top or bottom of the stairs. If you do have throw rugs, attach them to the floor with carpet tape.  Make sure that you have a light switch at the top of the stairs and the bottom of the stairs. If you do not have them, ask someone to add them for you. What else can I do to help prevent falls?  Wear shoes that:  Do not have high heels.  Have rubber bottoms.  Are comfortable and fit you well.  Are closed at the toe. Do not wear sandals.  If you use a stepladder:  Make sure that it is fully opened. Do not climb a closed  stepladder.  Make sure that both sides of the stepladder are locked into place.  Ask someone to hold it for you, if possible.  Clearly mark and make sure that you can see:  Any grab bars or handrails.  First and last steps.  Where the edge of each step is.  Use tools that help you move around (mobility aids) if they are needed. These include:  Canes.  Walkers.  Scooters.  Crutches.  Turn on the lights when you go into a dark area. Replace any light bulbs as soon as they burn out.  Set up your furniture so you have a clear path. Avoid moving your furniture around.  If any of your floors are uneven, fix them.  If there are any pets around you, be aware of where they are.  Review your medicines with your doctor. Some medicines can make you feel dizzy. This can increase your chance of falling. Ask your doctor what other things that you can do to help prevent falls. This information is not intended to replace advice given to you by your health care provider. Make sure you discuss any questions you have with your health care provider. Document Released: 09/10/2009 Document Revised: 04/21/2016 Document Reviewed: 12/19/2014 Elsevier Interactive Patient Education  2017 [employer].

## 2018-05-03 DIAGNOSIS — R0602 Shortness of breath: Secondary | ICD-10-CM | POA: Diagnosis not present

## 2018-05-03 DIAGNOSIS — J449 Chronic obstructive pulmonary disease, unspecified: Secondary | ICD-10-CM | POA: Diagnosis not present

## 2018-05-03 DIAGNOSIS — I739 Peripheral vascular disease, unspecified: Secondary | ICD-10-CM | POA: Diagnosis not present

## 2018-05-03 DIAGNOSIS — G4733 Obstructive sleep apnea (adult) (pediatric): Secondary | ICD-10-CM | POA: Diagnosis not present

## 2018-05-03 DIAGNOSIS — I442 Atrioventricular block, complete: Secondary | ICD-10-CM | POA: Diagnosis not present

## 2018-05-03 DIAGNOSIS — I10 Essential (primary) hypertension: Secondary | ICD-10-CM | POA: Diagnosis not present

## 2018-05-13 DIAGNOSIS — J449 Chronic obstructive pulmonary disease, unspecified: Secondary | ICD-10-CM | POA: Diagnosis not present

## 2018-05-24 ENCOUNTER — Encounter: Payer: Self-pay | Admitting: Family Medicine

## 2018-05-28 ENCOUNTER — Ambulatory Visit: Payer: Self-pay | Admitting: Family Medicine

## 2018-06-05 ENCOUNTER — Ambulatory Visit (INDEPENDENT_AMBULATORY_CARE_PROVIDER_SITE_OTHER): Payer: Medicare Other | Admitting: Family Medicine

## 2018-06-05 VITALS — BP 132/82 | HR 94 | Temp 98.7°F | Resp 16 | Ht 64.0 in | Wt 157.0 lb

## 2018-06-05 DIAGNOSIS — Z720 Tobacco use: Secondary | ICD-10-CM

## 2018-06-05 DIAGNOSIS — I10 Essential (primary) hypertension: Secondary | ICD-10-CM

## 2018-06-05 DIAGNOSIS — J449 Chronic obstructive pulmonary disease, unspecified: Secondary | ICD-10-CM

## 2018-06-05 DIAGNOSIS — E7800 Pure hypercholesterolemia, unspecified: Secondary | ICD-10-CM | POA: Diagnosis not present

## 2018-06-05 NOTE — Progress Notes (Signed)
Patient: Crystal White Female    DOB: [birth year]   77 y.o.   MRN: [id number] Visit Date: 06/05/2018  Today's Provider: [name], MD   Chief Complaint  Patient presents with  . Hypertension  . Hyperlipidemia   Subjective:   Patient saw Mckenzie on 04/17/2018 for AWE.   HPI   Hypertension, follow-up:  BP Readings from Last 3 Encounters:  06/05/18 132/82  04/17/18 130/72  02/20/18 128/80    She was last seen for hypertension 6 months ago.  BP at that visit was 128/80. Management since that visit includes no changes. She reports good compliance with treatment. She is not having side effects.  She is not exercising. She is adherent to low salt diet.   Outside blood pressures are not being checked. She is experiencing none.  Patient denies exertional chest pressure/discomfort, lower extremity edema and palpitations.   Cardiovascular risk factors include dyslipidemia.   Weight trend: stable Wt Readings from Last 3 Encounters:  06/05/18 157 lb (71.2 kg)  04/17/18 160 lb 3.2 oz (72.7 kg)  02/20/18 158 lb (71.7 kg)    Current diet: well balanced    Lipid/Cholesterol, Follow-up:   Last seen for this6 months ago.  Management changes since that visit include no changes. . Last Lipid Panel:    Component Value Date/Time   CHOL 292 (H) 07/25/2016 1442   CHOL 230 (H) 12/24/2012 1108   TRIG 312 (H) 07/25/2016 1442   TRIG 279 (H) 12/24/2012 1108   HDL 45 07/25/2016 1442   HDL 42 12/24/2012 1108   VLDL 56 (H) 12/24/2012 1108   LDLCALC 185 (H) 07/25/2016 1442   LDLCALC 132 (H) 12/24/2012 1108    Risk factors for vascular disease include hypertension  She reports good compliance with treatment. She is not having side effects.  Current symptoms include none and have been stable.  Allergies  Allergen Reactions  . Iodinated Diagnostic Agents Other (See Comments) and Rash    Pain and burning Reaction:  Burning and redness  Pain and burning  . Other  Rash    Pain and burning  . Iodine Other (See Comments)  . Statins Other (See Comments)    Reaction:  Leg cramps   . Bacitracin Swelling and Rash     Current Outpatient Medications:  .  acetaminophen (TYLENOL) 500 MG tablet, Take 500 mg by mouth every 6 (six) hours as needed., Disp: , Rfl:  .  albuterol (PROVENTIL HFA) 108 (90 Base) MCG/ACT inhaler, Inhale 2 puffs into the lungs every 6 (six) hours as needed for wheezing or shortness of breath., Disp: 3 each, Rfl: 3 .  alendronate (FOSAMAX) 70 MG tablet, Take 1 tablet (70 mg total) by mouth once a week., Disp: 12 tablet, Rfl: 3 .  ALPRAZolam (XANAX) 0.25 MG tablet, Take 1 tablet (0.25 mg total) by mouth 2 (two) times daily. (Patient taking differently: Take 0.25 mg by mouth 2 (two) times daily. ), Disp: 60 tablet, Rfl: 0 .  BREO ELLIPTA 100-25 MCG/INH AEPB, USE 1 INHALATION DAILY, Disp: 180 each, Rfl: 3 .  citalopram (CELEXA) 10 MG tablet, TAKE 1 TABLET BY MOUTH AT  BEDTIME, Disp: 90 tablet, Rfl: 3 .  ezetimibe (ZETIA) 10 MG tablet, Take 1 tablet (10 mg total) by mouth daily., Disp: 90 tablet, Rfl: 3 .  gabapentin (NEURONTIN) 100 MG capsule, TAKE 2 CAPSULES BY MOUTH 3  TIMES DAILY, Disp: 540 capsule, Rfl: 3 .  hydrochlorothiazide (HYDRODIURIL) 25 MG  tablet, TAKE 1 TABLET BY MOUTH  DAILY, Disp: 90 tablet, Rfl: 3 .  hyoscyamine (LEVBID) 0.375 MG 12 hr tablet, Take 1 tablet (0.375 mg total) by mouth 2 (two) times daily as needed for cramping. Reported on 05/03/2016, Disp: 180 tablet, Rfl: 3 .  losartan (COZAAR) 50 MG tablet, TAKE 1 TABLET BY MOUTH AT  BEDTIME, Disp: 90 tablet, Rfl: 3 .  meclizine (ANTIVERT) 25 MG tablet, Take 1 tablet (25 mg total) by mouth 2 (two) times daily as needed for dizziness., Disp: 180 tablet, Rfl: 3 .  mirtazapine (REMERON) 30 MG tablet, TAKE 1 TABLET BY MOUTH AT  BEDTIME, Disp: 90 tablet, Rfl: 3 .  OXYGEN, Inhale into the lungs daily. Uses at night with CPAP and PRN during daytime, Disp: , Rfl:  .  pantoprazole  (PROTONIX) 40 MG tablet, TAKE 1 TABLET BY MOUTH TWO  TIMES DAILY, Disp: 180 tablet, Rfl: 3 .  Tiotropium Bromide Monohydrate (SPIRIVA RESPIMAT) 2.5 MCG/ACT AERS, Inhale 2 puffs into the lungs daily., Disp: 1 Inhaler, Rfl: 6 .  traMADol (ULTRAM) 50 MG tablet, Take 1 tablet (50 mg total) every 8 (eight) hours as needed by mouth., Disp: 90 tablet, Rfl: 0 .  traZODone (DESYREL) 150 MG tablet, TAKE 1 TABLET BY MOUTH AT  BEDTIME, Disp: 90 tablet, Rfl: 3 .  doxycycline (VIBRA-TABS) 100 MG tablet, Take 1 tablet (100 mg total) by mouth 2 (two) times daily. (Patient not taking: Reported on 06/05/2018), Disp: 20 tablet, Rfl: 0 .  nicotine (NICODERM CQ) 14 mg/24hr patch, Place 1 patch (14 mg total) onto the skin daily. (Patient not taking: Reported on 04/17/2018), Disp: 30 patch, Rfl: 3 .  predniSONE (STERAPRED UNI-PAK 21 TAB) 10 MG (21) TBPK tablet, Day one take 6 tablets, day 2 take 5 tablets, day 3 take 4 tablets, day 4 take 3 tablets, day 5 take 2 tablet, and day 6 take 1 tablet. (Patient not taking: Reported on 06/05/2018), Disp: 21 tablet, Rfl: 0  Review of Systems  Constitutional: Negative for activity change, appetite change, chills, diaphoresis, fatigue, fever and unexpected weight change.  HENT: Negative.   Eyes: Negative.   Respiratory: Negative for cough and shortness of breath.   Cardiovascular: Negative for chest pain, palpitations and leg swelling.  Gastrointestinal: Negative.   Endocrine: Negative.   Musculoskeletal: Positive for arthralgias and back pain.  Skin: Negative.   Allergic/Immunologic: Negative for environmental allergies.  Neurological: Negative for dizziness, light-headedness and headaches.  Psychiatric/Behavioral: Negative.     Social History   Tobacco Use  . Smoking status: Current Every Day Smoker    Packs/day: 1.00    Years: 55.00    Pack years: 55.00    Types: Cigarettes  . Smokeless tobacco: Never Used  Substance Use Topics  . Alcohol use: Not Currently     Alcohol/week: 0.0 oz   Objective:   BP 132/82 (BP Location: Right Arm, Patient Position: Sitting, Cuff Size: Normal)   Pulse 94   Temp 98.7 F (37.1 C)   Resp 16   Ht 5\' 4"  (1.626 m)   Wt 157 lb (71.2 kg)   SpO2 92%   BMI 26.95 kg/m  Vitals:   06/05/18 1445  BP: 132/82  Pulse: 94  Resp: 16  Temp: 98.7 F (37.1 C)  SpO2: 92%  Weight: 157 lb (71.2 kg)  Height: 5\' 4"  (1.626 m)     Physical Exam  Constitutional: She is oriented to person, place, and time. She appears well-developed and well-nourished.  HENT:  Head: Normocephalic and atraumatic.  Right Ear: External ear normal.  Left Ear: External ear normal.  Nose: Nose normal.  Eyes: Conjunctivae are normal. No scleral icterus.  Neck: No thyromegaly present.  Cardiovascular: Normal rate, regular rhythm and normal heart sounds.  Pulmonary/Chest: Effort normal and breath sounds normal.  Abdominal: Soft.  Musculoskeletal: She exhibits no edema.  Neurological: She is alert and oriented to person, place, and time.  Skin: Skin is warm and dry.  Psychiatric: She has a normal mood and affect. Her behavior is normal. Judgment and thought content normal.        Assessment & Plan:     1. Essential (primary) hypertension  - Comprehensive metabolic panel - TSH  2. Hypercholesteremia  - Lipid panel  3. Current tobacco use   4. Chronic obstructive pulmonary disease, unspecified COPD type ([place])  - CBC with Differential/Platelet 5.Chronic Anxiety/Depression Stable.     I have done the exam and reviewed the above chart and it is accurate to the best of my knowledge. Development worker, community has been used in this note in any air is in the dictation or transcription are unintentional.  [name], MD  [place]

## 2018-06-06 ENCOUNTER — Telehealth: Payer: Self-pay

## 2018-06-06 LAB — CBC WITH DIFFERENTIAL/PLATELET
Basophils Absolute: 0.1 10*3/uL (ref 0.0–0.2)
Basos: 1 %
EOS (ABSOLUTE): 0.2 10*3/uL (ref 0.0–0.4)
Eos: 2 %
HEMATOCRIT: 48.4 % — AB (ref 34.0–46.6)
HEMOGLOBIN: 16.4 g/dL — AB (ref 11.1–15.9)
Immature Grans (Abs): 0 10*3/uL (ref 0.0–0.1)
Immature Granulocytes: 0 %
LYMPHS ABS: 2 10*3/uL (ref 0.7–3.1)
Lymphs: 22 %
MCH: 30 pg (ref 26.6–33.0)
MCHC: 33.9 g/dL (ref 31.5–35.7)
MCV: 89 fL (ref 79–97)
MONOCYTES: 6 %
MONOS ABS: 0.6 10*3/uL (ref 0.1–0.9)
NEUTROS ABS: 6.5 10*3/uL (ref 1.4–7.0)
Neutrophils: 69 %
Platelets: 209 10*3/uL (ref 150–450)
RBC: 5.47 x10E6/uL — ABNORMAL HIGH (ref 3.77–5.28)
RDW: 14.7 % (ref 12.3–15.4)
WBC: 9.3 10*3/uL (ref 3.4–10.8)

## 2018-06-06 LAB — COMPREHENSIVE METABOLIC PANEL
ALBUMIN: 4.2 g/dL (ref 3.5–4.8)
ALK PHOS: 86 IU/L (ref 39–117)
ALT: 10 IU/L (ref 0–32)
AST: 19 IU/L (ref 0–40)
Albumin/Globulin Ratio: 1.6 (ref 1.2–2.2)
BILIRUBIN TOTAL: 0.3 mg/dL (ref 0.0–1.2)
BUN / CREAT RATIO: 15 (ref 12–28)
BUN: 19 mg/dL (ref 8–27)
CO2: 27 mmol/L (ref 20–29)
Calcium: 9.3 mg/dL (ref 8.7–10.3)
Chloride: 102 mmol/L (ref 96–106)
Creatinine, Ser: 1.29 mg/dL — ABNORMAL HIGH (ref 0.57–1.00)
GFR calc non Af Amer: 41 mL/min/{1.73_m2} — ABNORMAL LOW (ref >59)
GFR, EST AFRICAN AMERICAN: 47 mL/min/{1.73_m2} — AB (ref >59)
GLOBULIN, TOTAL: 2.6 g/dL (ref 1.5–4.5)
Glucose: 90 mg/dL (ref 65–99)
POTASSIUM: 4.3 mmol/L (ref 3.5–5.2)
Sodium: 144 mmol/L (ref 134–144)
Total Protein: 6.8 g/dL (ref 6.0–8.5)

## 2018-06-06 LAB — LIPID PANEL
CHOL/HDL RATIO: 5 ratio — AB (ref 0.0–4.4)
Cholesterol, Total: 262 mg/dL — ABNORMAL HIGH (ref 100–199)
HDL: 52 mg/dL (ref >39)
LDL Calculated: 169 mg/dL — ABNORMAL HIGH (ref 0–99)
Triglycerides: 207 mg/dL — ABNORMAL HIGH (ref 0–149)
VLDL Cholesterol Cal: 41 mg/dL — ABNORMAL HIGH (ref 5–40)

## 2018-06-06 LAB — TSH: TSH: 1.41 u[IU]/mL (ref 0.450–4.500)

## 2018-06-06 MED ORDER — TRAZODONE HCL 150 MG PO TABS: 150.0000 mg | ORAL_TABLET | Freq: Every day | ORAL | 3 refills | Status: DC

## 2018-06-06 MED ORDER — CITALOPRAM HYDROBROMIDE 10 MG PO TABS: 10.0000 mg | ORAL_TABLET | Freq: Every day | ORAL | 3 refills | Status: DC

## 2018-06-06 MED ORDER — MIRTAZAPINE 30 MG PO TABS: 30.0000 mg | ORAL_TABLET | Freq: Every day | ORAL | 3 refills | Status: DC

## 2018-06-06 MED ORDER — PANTOPRAZOLE SODIUM 40 MG PO TBEC: 40.0000 mg | DELAYED_RELEASE_TABLET | Freq: Two times a day (BID) | ORAL | 3 refills | Status: DC

## 2018-06-06 MED ORDER — LOSARTAN POTASSIUM 50 MG PO TABS: 50.0000 mg | ORAL_TABLET | Freq: Every day | ORAL | 3 refills | Status: DC

## 2018-06-06 MED ORDER — EZETIMIBE 10 MG PO TABS: 10.0000 mg | ORAL_TABLET | Freq: Every day | ORAL | 3 refills | Status: DC

## 2018-06-06 MED ORDER — ALENDRONATE SODIUM 70 MG PO TABS: 70.0000 mg | ORAL_TABLET | ORAL | 3 refills | Status: DC

## 2018-06-06 MED ORDER — GABAPENTIN 100 MG PO CAPS: 200.0000 mg | ORAL_CAPSULE | Freq: Three times a day (TID) | ORAL | 3 refills | Status: DC

## 2018-06-06 MED ORDER — HYDROCHLOROTHIAZIDE 25 MG PO TABS: 25.0000 mg | ORAL_TABLET | Freq: Every day | ORAL | 3 refills | Status: DC

## 2018-06-06 NOTE — Telephone Encounter (Signed)
-----   Message from Jerrol Banana., MD sent at 06/06/2018  9:10 AM EDT ----- Stable.

## 2018-06-06 NOTE — Telephone Encounter (Signed)
Advised patient of results. Medications were sent into the pharmacy.

## 2018-06-12 DIAGNOSIS — J449 Chronic obstructive pulmonary disease, unspecified: Secondary | ICD-10-CM | POA: Diagnosis not present

## 2018-07-13 DIAGNOSIS — J449 Chronic obstructive pulmonary disease, unspecified: Secondary | ICD-10-CM | POA: Diagnosis not present

## 2018-07-16 ENCOUNTER — Telehealth: Payer: Self-pay | Admitting: Family Medicine

## 2018-07-16 NOTE — Telephone Encounter (Signed)
Please review. Thanks!  

## 2018-07-16 NOTE — Telephone Encounter (Signed)
Dr. Demetra Shiner with [facility] states they are going to be doing dental surgery on Crystal White and wants to know if she is still taking the Fosamax or any other drugs that could cause problems with her surgery.  She is scheduled for Sept 11th.  CB# [phone]  Thanks Con Memos

## 2018-07-16 NOTE — Telephone Encounter (Signed)
She can stop Alendronate for now

## 2018-07-17 NOTE — Telephone Encounter (Signed)
Left message to call back  

## 2018-07-23 NOTE — Telephone Encounter (Signed)
L/M stating that patient can stop Fosamax for now.

## 2018-10-06 ENCOUNTER — Telehealth: Payer: Self-pay

## 2018-10-06 NOTE — Telephone Encounter (Signed)
Call in regarding lung screening. Pt is a current smoker, smoking between a 1/2 to 1 pack per day. Scan can be any day and time. Pt has no new medical issues.

## 2018-10-09 ENCOUNTER — Ambulatory Visit: Payer: Medicare Other | Admitting: Internal Medicine

## 2018-10-09 ENCOUNTER — Telehealth: Payer: Self-pay | Admitting: *Deleted

## 2018-10-09 DIAGNOSIS — Z87891 Personal history of nicotine dependence: Secondary | ICD-10-CM

## 2018-10-09 DIAGNOSIS — Z122 Encounter for screening for malignant neoplasm of respiratory organs: Secondary | ICD-10-CM

## 2018-10-09 NOTE — Telephone Encounter (Signed)
Patient has been notified that annual lung cancer screening low dose CT scan is due currently or will be in near future. Confirmed that patient is within the age range of 55-77, and asymptomatic, (no signs or symptoms of lung cancer). Patient denies illness that would prevent curative treatment for lung cancer if found. Verified smoking history, (current, 55.75 pack year). The shared decision making visit was done 10/18/17. Patient is agreeable for CT scan being scheduled.

## 2018-10-09 NOTE — Progress Notes (Deleted)
  [facility] Consultation      Assessment and Plan:  COPD/emphysema, with chronic hypoxic respiratory failure. -on Breo 100 to be used as once daily. -Discussed trying to increase her activity level due to presence of debility/deconditioning. -Continue oxygen at 2 L with activity.  Lung nodules. -Reviewed, most recent CT of the chest images from 04/29/2015, lung nodules appear stable, since 02/02/2015 -CT chest 10/25/16--no change in size of nodules, completed about 18 months of surveillance, and no further surveillance would be recommended for these nodules.  --She continues to have concerns about lung cancer, will refer to lung cancer screening.   Nicotine abuse. -Discussed the importance of smoking cessation, she does feel that she is ready to quit at this time.  Spent greater than 3 minutes in discussion -Was given prescription for Chantix at previous visit, however it caused severe nightmares.  She is not interested in trying nicotine patches.  Sleep apnea. -Continue with CPAP at 11, oxygen via CPAP.   Date: 10/09/2018  MRN# [id number] Crystal White 1941-02-25    Crystal White is a 77 y.o. old female seen in consultation for chief complaint of:    No chief complaint on file.   HPI:  The patient is a 77 year old female with a history of COPD, OSA, hypertension, GERD.  She previously had a CT of the chest which showed bilateral emphysema with multiple pulmonary nodules, largest of which was 7 mm.  She underwent 18 months of surveillance which showed no significant change in the size of the nodule, however due to continued concern she was referred to lung cancer screening, where she is continuing to undergo continued surveillance. At last visit she was advised to continue Breo, oxygen at 2 L, continued smoking cessation, advised to continue with CPAP at a pressure setting of 11 with oxygen.  She is using her CPAP every night, with oxygen.    **CT chest  08/25/16>>no significant change from previous.   Previous testing: CT chest 02/02/2016:Stable sub-cm bilateral pulmonary nodules shows bilateral emphysematous changes in both lungs, CT chest 08/04/2015: Minimally changed, pulmonary nodules, the largest of which was 7 mm. No significant change when compared with previous CT film from 04/29/2015.  CPAP titration study 09/15/2015: Requires CPAP at a pressure of 11. Oxygen desaturations were noted. Sleep study 08/11/2015: Mild sleep apnea with an apnea-hypopnea index of 14.  Medication:       Allergies:  Iodinated diagnostic agents; Other; Iodine; Statins; and Bacitracin      LABORATORY PANEL:   CBC No results for input(s): WBC, HGB, HCT, PLT in the last 168 hours. ------------------------------------------------------------------------------------------------------------------  Chemistries  No results for input(s): NA, K, CL, CO2, GLUCOSE, BUN, CREATININE, CALCIUM, MG, AST, ALT, ALKPHOS, BILITOT in the last 168 hours.  Invalid input(s): GFRCGP ------------------------------------------------------------------------------------------------------------------  Cardiac Enzymes No results for input(s): TROPONINI in the last 168 hours. ------------------------------------------------------------  RADIOLOGY:  No results found.     Thank  you for the consultation and for allowing [facility] to assist in the care of your patient. Our recommendations are noted above.  Please contact us if we can be of further service.  Marda Stalker, M.D., F.C.C.P.  Board Certified in Internal Medicine, Pulmonary Medicine, [place], and Sleep Medicine.  [facility] Office Number: [phone]   10/09/2018

## 2018-10-10 ENCOUNTER — Ambulatory Visit (INDEPENDENT_AMBULATORY_CARE_PROVIDER_SITE_OTHER): Payer: Medicare Other | Admitting: Internal Medicine

## 2018-10-10 ENCOUNTER — Encounter: Payer: Self-pay | Admitting: Internal Medicine

## 2018-10-10 VITALS — BP 148/80 | HR 111 | Resp 16 | Ht 64.0 in | Wt 156.0 lb

## 2018-10-10 DIAGNOSIS — F17210 Nicotine dependence, cigarettes, uncomplicated: Secondary | ICD-10-CM

## 2018-10-10 DIAGNOSIS — J9611 Chronic respiratory failure with hypoxia: Secondary | ICD-10-CM

## 2018-10-10 DIAGNOSIS — Z720 Tobacco use: Secondary | ICD-10-CM

## 2018-10-10 DIAGNOSIS — J438 Other emphysema: Secondary | ICD-10-CM | POA: Diagnosis not present

## 2018-10-10 NOTE — Patient Instructions (Addendum)
Continue using Breo, CPAP, oxygen.   --Quitting smoking is the most important thing that you can do for your health.  --Quitting smoking will have greater affect on your health than any medicine that we can give you.

## 2018-10-10 NOTE — Progress Notes (Signed)
[facility] Consultation      Assessment and Plan:  COPD/emphysema with chronic hypoxic respiratory failure. -Continue Breo.  -Continue oxygen at 2L --PCV13 08/28/14, PPSV 23 03/07/11. Up to date with flu.   Lung nodules. -Reviewed, most recent CT of the chest images from 04/29/2015, lung nodules appear stable, since 02/02/2015 -CT chest 10/25/16--no change in size of nodules, completed about 18 months of surveillance, and no further surveillance would be recommended for these nodules.  --She continues to have concerns about lung cancer, will continue lung cancer screening.   Nicotine abuse. -Discussed the importance of smoking cessation.  -Was given prescription for Chantix at previous visit, however it caused severe nightmares.  She is not interested in trying nicotine patches. NOT interested in quitting at this time. Spent 3 minutes in discussion.   Sleep apnea. -Doing well with CPAP, no daytime sleepniss. Continue with CPAP at 11, oxygen via CPAP.  Return in about 9 months (around 07/11/2019).   Date: 10/10/2018  MRN# [id number] Crystal White 05-15-1941    Crystal White is a 77 y.o. old female seen in consultation for chief complaint of:    Chief Complaint  Patient presents with  . COPD    pt uses 2 liters 02 with cpap. She is here for regular f/u. She is not having any breathing issues.    HPI:  The patient is a 77 year old female with a history of COPD, OSA, hypertension, GERD.  She previously had a CT of the chest which showed bilateral emphysema with multiple pulmonary nodules, largest of which was 7 mm.  She underwent 18 months of surveillance which showed no significant change in the size of the nodule, however due to continued concern she was referred to lung cancer screening, where she is continuing to undergo continued surveillance. At last visit she was advised to continue Breo, oxygen at 2 L, continued smoking cessation, advised to continue  with CPAP at a pressure setting of 11 with oxygen.  Since her last visit she feels that her breathing has been doing well. She remains on Breo once per daily, rinses mouth. She is using oxygen via CPAP both every night, she does not feel sleepy during the day.  She is still smoking half ppd, not thinking of quitting. "I am going to smoke until I go to hospice"  Advanced care Planning:  She does not want to be rescuscitated/undergo CPR,  she is not sure about short term ventilator support and thinks that she may be ok with this if its for a short term. 12 minutes spend in discussion.   **CT chest 08/25/16>>no significant change from previous.  Previous testing: CT chest 02/02/2016:Stable sub-cm bilateral pulmonary nodules shows bilateral emphysematous changes in both lungs, CT chest 08/04/2015: Minimally changed, pulmonary nodules, the largest of which was 7 mm. No significant change when compared with previous CT film from 04/29/2015.  CPAP titration study 09/15/2015: Requires CPAP at a pressure of 11. Oxygen desaturations were noted. Sleep study 08/11/2015: Mild sleep apnea with an apnea-hypopnea index of 14.   Current Outpatient Medications:  .  acetaminophen (TYLENOL) 500 MG tablet, Take 500 mg by mouth every 6 (six) hours as needed., Disp: , Rfl:  .  albuterol (PROVENTIL HFA) 108 (90 Base) MCG/ACT inhaler, Inhale 2 puffs into the lungs every 6 (six) hours as needed for wheezing or shortness of breath., Disp: 3 each, Rfl: 3 .  ALPRAZolam (XANAX) 0.25 MG tablet, Take 1 tablet (0.25 mg total) by  mouth 2 (two) times daily. (Patient taking differently: Take 0.25 mg by mouth 2 (two) times daily. ), Disp: 60 tablet, Rfl: 0 .  BREO ELLIPTA 100-25 MCG/INH AEPB, USE 1 INHALATION DAILY, Disp: 180 each, Rfl: 3 .  citalopram (CELEXA) 10 MG tablet, Take 1 tablet (10 mg total) by mouth at bedtime., Disp: 90 tablet, Rfl: 3 .  ezetimibe (ZETIA) 10 MG tablet, Take 1 tablet (10 mg total) by mouth daily.,  Disp: 90 tablet, Rfl: 3 .  gabapentin (NEURONTIN) 100 MG capsule, Take 2 capsules (200 mg total) by mouth 3 (three) times daily., Disp: 540 capsule, Rfl: 3 .  hydrochlorothiazide (HYDRODIURIL) 25 MG tablet, Take 1 tablet (25 mg total) by mouth daily., Disp: 90 tablet, Rfl: 3 .  hyoscyamine (LEVBID) 0.375 MG 12 hr tablet, Take 1 tablet (0.375 mg total) by mouth 2 (two) times daily as needed for cramping. Reported on 05/03/2016, Disp: 180 tablet, Rfl: 3 .  losartan (COZAAR) 50 MG tablet, Take 1 tablet (50 mg total) by mouth at bedtime., Disp: 90 tablet, Rfl: 3 .  meclizine (ANTIVERT) 25 MG tablet, Take 1 tablet (25 mg total) by mouth 2 (two) times daily as needed for dizziness., Disp: 180 tablet, Rfl: 3 .  mirtazapine (REMERON) 30 MG tablet, Take 1 tablet (30 mg total) by mouth at bedtime., Disp: 90 tablet, Rfl: 3 .  OXYGEN, Inhale into the lungs daily. Uses at night with CPAP and PRN during daytime, Disp: , Rfl:  .  pantoprazole (PROTONIX) 40 MG tablet, Take 1 tablet (40 mg total) by mouth 2 (two) times daily., Disp: 180 tablet, Rfl: 3 .  traMADol (ULTRAM) 50 MG tablet, Take 1 tablet (50 mg total) every 8 (eight) hours as needed by mouth., Disp: 90 tablet, Rfl: 0 .  traZODone (DESYREL) 150 MG tablet, Take 1 tablet (150 mg total) by mouth at bedtime., Disp: 90 tablet, Rfl: 3 .  alendronate (FOSAMAX) 70 MG tablet, Take 1 tablet (70 mg total) by mouth once a week. (Patient not taking: Reported on 10/10/2018), Disp: 12 tablet, Rfl: 3   Allergies:  Iodinated diagnostic agents; Other; Iodine; Statins; and Bacitracin  Review of Systems:  Constitutional: Feels well. Cardiovascular: Denies chest pain, exertional chest pain.  Pulmonary: Denies hemoptysis, pleuritic chest pain.   The remainder of systems were reviewed and were found to be negative other than what is documented in the HPI.    Physical Examination:   VS: BP (!) 148/80 (BP Location: Left Arm, Cuff Size: Normal)   Pulse (!) 111   Resp 16    Ht 5\' 4"  (1.626 m)   Wt 156 lb (70.8 kg)   SpO2 94%   BMI 26.78 kg/m   General Appearance: No distress  Neuro:without focal findings, mental status, speech normal, alert and oriented HEENT: PERRLA, EOM intact Pulmonary: No wheezing, No rales  CardiovascularNormal S1,S2.  No m/r/g.  Abdomen: Benign, Soft, non-tender, No masses Renal:  No costovertebral tenderness  GU:  No performed at this time. Endoc: No evident thyromegaly, no signs of acromegaly or Cushing features Skin:   warm, no rashes, no ecchymosis  Extremities: normal, no cyanosis, clubbing.      LABORATORY PANEL:   CBC No results for input(s): WBC, HGB, HCT, PLT in the last 168 hours. ------------------------------------------------------------------------------------------------------------------  Chemistries  No results for input(s): NA, K, CL, CO2, GLUCOSE, BUN, CREATININE, CALCIUM, MG, AST, ALT, ALKPHOS, BILITOT in the last 168 hours.  Invalid input(s): GFRCGP ------------------------------------------------------------------------------------------------------------------  Cardiac Enzymes No results for input(s):  TROPONINI in the last 168 hours. ------------------------------------------------------------  RADIOLOGY:  No results found.     Thank  you for the consultation and for allowing [facility] to assist in the care of your patient. Our recommendations are noted above.  Please contact us if we can be of further service.  Marda Stalker, M.D., F.C.C.P.  Board Certified in Internal Medicine, Pulmonary Medicine, [place], and Sleep Medicine.  [facility] Office Number: [phone]   10/10/2018

## 2018-10-11 ENCOUNTER — Ambulatory Visit: Payer: Medicare Other | Admitting: Internal Medicine

## 2018-10-19 ENCOUNTER — Other Ambulatory Visit: Payer: Self-pay

## 2018-10-19 ENCOUNTER — Ambulatory Visit (INDEPENDENT_AMBULATORY_CARE_PROVIDER_SITE_OTHER): Payer: Medicare Other | Admitting: Family Medicine

## 2018-10-19 ENCOUNTER — Encounter: Payer: Self-pay | Admitting: Family Medicine

## 2018-10-19 VITALS — BP 138/78 | HR 82 | Temp 97.9°F | Ht 65.0 in | Wt 152.2 lb

## 2018-10-19 DIAGNOSIS — R3915 Urgency of urination: Secondary | ICD-10-CM

## 2018-10-19 DIAGNOSIS — K22719 Barrett's esophagus with dysplasia, unspecified: Secondary | ICD-10-CM

## 2018-10-19 DIAGNOSIS — R110 Nausea: Secondary | ICD-10-CM | POA: Diagnosis not present

## 2018-10-19 DIAGNOSIS — Z9889 Other specified postprocedural states: Secondary | ICD-10-CM

## 2018-10-19 DIAGNOSIS — F419 Anxiety disorder, unspecified: Secondary | ICD-10-CM

## 2018-10-19 LAB — POCT URINALYSIS DIPSTICK
Blood, UA: NEGATIVE
Glucose, UA: NEGATIVE
LEUKOCYTES UA: NEGATIVE
Nitrite, UA: NEGATIVE
PROTEIN UA: NEGATIVE
Spec Grav, UA: 1.02 (ref 1.010–1.025)
Urobilinogen, UA: 0.2 E.U./dL
pH, UA: 5 (ref 5.0–8.0)

## 2018-10-19 MED ORDER — ONDANSETRON HCL 4 MG PO TABS: 4.0000 mg | ORAL_TABLET | Freq: Three times a day (TID) | ORAL | 0 refills | Status: DC | PRN

## 2018-10-19 NOTE — Progress Notes (Signed)
Patient: Crystal White Female    DOB: [birth year]   77 y.o.   MRN: [id number] Visit Date: 10/19/2018  Today's Provider: Vernie Murders, PA   Chief Complaint  Patient presents with  . Nausea    light vomiting, weak and lightheaded, almost fainted today 10/19/18 in shower  . Urinary Urgency   Subjective:    HPI   Patient presents that since Monday night 10/15/18  she has been having hot flashes and then will have chills.  She had vomiting yesterday morning 10/18/18 and nausea with stomach cramps.  She stated that she had to urinate about 15 times during the night 10/18/18.  She has no pain, pressure in abdomen and she feels she can't always hold her urine.     Past Medical History:  Diagnosis Date  . Arthritis    hands, lower back  . Barrett's esophagus   . COPD (chronic obstructive pulmonary disease) ([place])   . Emphysema lung ([place])   . GERD (gastroesophageal reflux disease)   . Hernia of abdominal cavity   . Hyperlipidemia   . Hypertension   . Mobitz type II atrioventricular block   . OSA (obstructive sleep apnea)    on CPAP  . Peripheral neuropathy   . Presence of permanent cardiac pacemaker 09/22/11   Biotronik - EVIADR-T [facility])  . Shortness of breath dyspnea   . Stomach ulcer    Past Surgical History:  Procedure Laterality Date  . APPENDECTOMY  2006   Dr. Pat Patrick  . BROW LIFT Bilateral 05/03/2016   Procedure: BLEPHAROPLASTY  BILATERAL UPPER EYELIDS WITH EXCESS SKIN REMOVAL BILATERAL BROW PTOSIS REPAIR BLEPHAROPTOSIS REPAIR BILATERAL EYES ;  Surgeon: Karle Starch, MD;  Location: [facility];  Service: Ophthalmology;  Laterality: Bilateral;  . CARDIAC CATHETERIZATION  2007  . COLON SURGERY    . COLONOSCOPY WITH PROPOFOL N/A 04/14/2015   Procedure: COLONOSCOPY WITH PROPOFOL;  Surgeon: Lollie Sails, MD;  Location: [facility] ENDOSCOPY;  Service: Endoscopy;  Laterality: N/A;  . ESOPHAGOGASTRODUODENOSCOPY N/A 04/14/2015   Procedure: ESOPHAGOGASTRODUODENOSCOPY  (EGD);  Surgeon: Lollie Sails, MD;  Location: [facility] ENDOSCOPY;  Service: Endoscopy;  Laterality: N/A;  . ESOPHAGOGASTRODUODENOSCOPY (EGD) WITH PROPOFOL N/A 07/05/2017   Procedure: ESOPHAGOGASTRODUODENOSCOPY (EGD) WITH PROPOFOL;  Surgeon: Manya Silvas, MD;  Location: [facility] ENDOSCOPY;  Service: Endoscopy;  Laterality: N/A;  . HIATAL HERNIA REPAIR  2007  . LAPAROSCOPIC RIGHT HEMI COLECTOMY Right 06/22/2015   Procedure: LAPAROSCOPIC RIGHT HEMI COLECTOMY;  Surgeon: Marlyce Huge, MD;  Location: [facility] ORS;  Service: General;  Laterality: Right;  . NISSEN FUNDOPLICATION  7253  . PACEMAKER INSERTION  09/22/11   [facility]  - Biotronik EVIADR-T  . TOTAL ABDOMINAL HYSTERECTOMY  1980   Family History  Adopted: Yes  Problem Relation Age of Onset  . Diabetes Son    Allergies  Allergen Reactions  . Iodinated Diagnostic Agents Other (See Comments) and Rash    Pain and burning Reaction:  Burning and redness  Pain and burning  . Other Rash    Pain and burning  . Iodine Other (See Comments)  . Statins Other (See Comments)    Reaction:  Leg cramps   . Bacitracin Swelling and Rash    Current Outpatient Medications:  .  acetaminophen (TYLENOL) 500 MG tablet, Take 500 mg by mouth every 6 (six) hours as needed., Disp: , Rfl:  .  albuterol (PROVENTIL HFA) 108 (90 Base) MCG/ACT inhaler, Inhale 2 puffs into the lungs every  6 (six) hours as needed for wheezing or shortness of breath., Disp: 3 each, Rfl: 3 .  ALPRAZolam (XANAX) 0.25 MG tablet, Take 1 tablet (0.25 mg total) by mouth 2 (two) times daily. (Patient taking differently: Take 0.25 mg by mouth 2 (two) times daily. ), Disp: 60 tablet, Rfl: 0 .  BREO ELLIPTA 100-25 MCG/INH AEPB, USE 1 INHALATION DAILY, Disp: 180 each, Rfl: 3 .  citalopram (CELEXA) 10 MG tablet, Take 1 tablet (10 mg total) by mouth at bedtime., Disp: 90 tablet, Rfl: 3 .  ezetimibe (ZETIA) 10 MG tablet, Take 1 tablet (10 mg total) by mouth daily., Disp: 90 tablet, Rfl: 3 .   gabapentin (NEURONTIN) 100 MG capsule, Take 2 capsules (200 mg total) by mouth 3 (three) times daily., Disp: 540 capsule, Rfl: 3 .  hydrochlorothiazide (HYDRODIURIL) 25 MG tablet, Take 1 tablet (25 mg total) by mouth daily., Disp: 90 tablet, Rfl: 3 .  hyoscyamine (LEVBID) 0.375 MG 12 hr tablet, Take 1 tablet (0.375 mg total) by mouth 2 (two) times daily as needed for cramping. Reported on 05/03/2016, Disp: 180 tablet, Rfl: 3 .  losartan (COZAAR) 50 MG tablet, Take 1 tablet (50 mg total) by mouth at bedtime., Disp: 90 tablet, Rfl: 3 .  meclizine (ANTIVERT) 25 MG tablet, Take 1 tablet (25 mg total) by mouth 2 (two) times daily as needed for dizziness., Disp: 180 tablet, Rfl: 3 .  mirtazapine (REMERON) 30 MG tablet, Take 1 tablet (30 mg total) by mouth at bedtime., Disp: 90 tablet, Rfl: 3 .  OXYGEN, Inhale into the lungs daily. Uses at night with CPAP and PRN during daytime, Disp: , Rfl:  .  pantoprazole (PROTONIX) 40 MG tablet, Take 1 tablet (40 mg total) by mouth 2 (two) times daily., Disp: 180 tablet, Rfl: 3 .  traMADol (ULTRAM) 50 MG tablet, Take 1 tablet (50 mg total) every 8 (eight) hours as needed by mouth., Disp: 90 tablet, Rfl: 0 .  alendronate (FOSAMAX) 70 MG tablet, Take 1 tablet (70 mg total) by mouth once a week. (Patient not taking: Reported on 10/10/2018), Disp: 12 tablet, Rfl: 3 .  traZODone (DESYREL) 150 MG tablet, Take 1 tablet (150 mg total) by mouth at bedtime., Disp: 90 tablet, Rfl: 3  Review of Systems  Constitutional: Negative.   HENT: Negative.   Eyes: Negative.   Respiratory: Negative.   Cardiovascular: Negative.   Gastrointestinal: Positive for nausea. Negative for abdominal distention, abdominal pain, anal bleeding, blood in stool, constipation, diarrhea, rectal pain and vomiting.  Endocrine: Positive for cold intolerance and heat intolerance. Negative for polydipsia, polyphagia and polyuria.  Genitourinary: Positive for frequency and urgency. Negative for decreased urine  volume, difficulty urinating, dyspareunia, dysuria, enuresis, flank pain, genital sores, hematuria, menstrual problem, pelvic pain, vaginal bleeding, vaginal discharge and vaginal pain.  Musculoskeletal: Negative.   Skin: Negative.   Allergic/Immunologic: Negative.   Neurological: Positive for weakness and light-headedness (feels like she is going to faint). Negative for dizziness, tremors, seizures, syncope, facial asymmetry, speech difficulty, numbness and headaches.  Hematological: Negative.   Psychiatric/Behavioral: Positive for sleep disturbance. Negative for agitation, behavioral problems, confusion, decreased concentration, dysphoric mood, hallucinations and self-injury. The patient is not nervous/anxious and is not hyperactive.    Social History   Tobacco Use  . Smoking status: Current Every Day Smoker    Packs/day: 1.00    Years: 55.00    Pack years: 55.00    Types: Cigarettes  . Smokeless tobacco: Never Used  Substance Use Topics  .  Alcohol use: Not Currently    Alcohol/week: 0.0 standard drinks   Objective:   BP 138/78 (BP Location: Right Arm, Patient Position: Sitting, Cuff Size: Normal)   Pulse 82   Temp 97.9 F (36.6 C) (Oral)   Ht 5\' 5"  (1.651 m)   Wt 152 lb 3.2 oz (69 kg)   SpO2 96%   BMI 25.33 kg/m   Wt Readings from Last 3 Encounters:  10/19/18 152 lb 3.2 oz (69 kg)  10/10/18 156 lb (70.8 kg)  06/05/18 157 lb (71.2 kg)   Vitals:   10/19/18 1509  BP: 138/78  Pulse: 82  Temp: 97.9 F (36.6 C)  TempSrc: Oral  SpO2: 96%  Weight: 152 lb 3.2 oz (69 kg)  Height: 5\' 5"  (1.651 m)   Physical Exam  Constitutional: She is oriented to person, place, and time. She appears well-developed and well-nourished. No distress.  HENT:  Head: Normocephalic and atraumatic.  Right Ear: Hearing normal.  Left Ear: Hearing normal.  Nose: Nose normal.  Eyes: Conjunctivae and lids are normal. Right eye exhibits no discharge. Left eye exhibits no discharge. No scleral icterus.   Cardiovascular: Normal rate and regular rhythm.  Pulmonary/Chest: Effort normal and breath sounds normal. No respiratory distress.  Abdominal: Soft. Bowel sounds are normal. She exhibits distension and mass. There is tenderness. There is rebound and guarding.  Musculoskeletal: Normal range of motion.  Neurological: She is alert and oriented to person, place, and time.  Skin: Skin is intact. No lesion and no rash noted.  Psychiatric: She has a normal mood and affect. Her speech is normal and behavior is normal. Thought content normal.      Assessment & Plan:     1. Nausea Some nausea and a "faint" sensation during a "warm" shower today. No vomiting. Also, had diarrhea 2 days ago requiring 4 Imodium-AD capsules to stop it. No recurrences today. No abdominal pain at the present and no nausea now. Denies melena or hematemesis. Suspect this is related to GERD symptoms and esophageal spasms with history of gastritis with esophageal stricture. Given Zofran 4 mg TID for nausea. Check CBC and CMP. May need recheck with gastroenterologist pending reports. - CBC with Differential/Platelet - Comprehensive metabolic panel - ondansetron (ZOFRAN) 4 MG tablet; Take 1 tablet (4 mg total) by mouth every 8 (eight) hours as needed for nausea or vomiting.  Dispense: 20 tablet; Refill: 0  2. Barrett's esophagus with dysplasia Biopsy report during EGD on 07-05-17 showed gastritis with stricture. States she still has episodes of feeling food gets "stuck" as she swallows and will have regurgitation in the past. May need to use the LevBID for esophageal spasms and/or have GI recheck this year. - CBC with Differential/Platelet  3. History of Nissen fundoplication Had this surgery in 2007 for GERD with hiatal hernia and Barrett's Esophagitis. No recent vomiting and still taking the Pantoprazole 40 mg BID. Upper endoscopy on 07-05-17 showed gastritis with stricture requiring dilation.   4. Urgency of micturition No  burning or frequency of urination. No hematuria. Some urgency the past week. Urinalysis negative for signs of infection. Some ketones with her history of eating only one meal in the evenings most days. Must drink extra fluids and get back on a regular diet. - POCT Urinalysis Dipstick  5. Anxiety Has been using Trazodone 150 mg hs, Mirtazapine 30 mg hs and Citalopram 10 mg hs since July 2019. Feels this has been some help except she doesn't feel she sleeps enough. Occasionally will  use the Xanax 0.25 mg BID. Will get CBC and CMP for follow up of chemistry. Suspicious of this combination causing some nausea, also.  - CBC with Differential/Platelet - Comprehensive metabolic panel       Vernie Murders, PA  [facility]

## 2018-10-20 LAB — COMPREHENSIVE METABOLIC PANEL
ALBUMIN: 4.8 g/dL (ref 3.5–4.8)
ALK PHOS: 78 IU/L (ref 39–117)
ALT: 9 IU/L (ref 0–32)
AST: 15 IU/L (ref 0–40)
Albumin/Globulin Ratio: 2.1 (ref 1.2–2.2)
BUN / CREAT RATIO: 14 (ref 12–28)
BUN: 15 mg/dL (ref 8–27)
Bilirubin Total: 0.3 mg/dL (ref 0.0–1.2)
CALCIUM: 9.9 mg/dL (ref 8.7–10.3)
CO2: 21 mmol/L (ref 20–29)
Chloride: 97 mmol/L (ref 96–106)
Creatinine, Ser: 1.11 mg/dL — ABNORMAL HIGH (ref 0.57–1.00)
GFR calc Af Amer: 56 mL/min/{1.73_m2} — ABNORMAL LOW (ref >59)
GFR, EST NON AFRICAN AMERICAN: 49 mL/min/{1.73_m2} — AB (ref >59)
GLUCOSE: 88 mg/dL (ref 65–99)
Globulin, Total: 2.3 g/dL (ref 1.5–4.5)
Potassium: 4.8 mmol/L (ref 3.5–5.2)
Sodium: 141 mmol/L (ref 134–144)
Total Protein: 7.1 g/dL (ref 6.0–8.5)

## 2018-10-20 LAB — CBC WITH DIFFERENTIAL/PLATELET
BASOS ABS: 0.1 10*3/uL (ref 0.0–0.2)
Basos: 1 %
EOS (ABSOLUTE): 0.1 10*3/uL (ref 0.0–0.4)
EOS: 1 %
HEMOGLOBIN: 17.7 g/dL — AB (ref 11.1–15.9)
Hematocrit: 51.9 % — ABNORMAL HIGH (ref 34.0–46.6)
Immature Grans (Abs): 0 10*3/uL (ref 0.0–0.1)
Immature Granulocytes: 0 %
LYMPHS ABS: 1.6 10*3/uL (ref 0.7–3.1)
LYMPHS: 16 %
MCH: 31.3 pg (ref 26.6–33.0)
MCHC: 34.1 g/dL (ref 31.5–35.7)
MCV: 92 fL (ref 79–97)
MONOCYTES: 8 %
Monocytes Absolute: 0.8 10*3/uL (ref 0.1–0.9)
NEUTROS PCT: 74 %
Neutrophils Absolute: 7.4 10*3/uL — ABNORMAL HIGH (ref 1.4–7.0)
Platelets: 224 10*3/uL (ref 150–450)
RBC: 5.66 x10E6/uL — ABNORMAL HIGH (ref 3.77–5.28)
RDW: 13.7 % (ref 12.3–15.4)
WBC: 10 10*3/uL (ref 3.4–10.8)

## 2018-10-23 ENCOUNTER — Ambulatory Visit
Admission: RE | Admit: 2018-10-23 | Discharge: 2018-10-23 | Disposition: A | Discharge status: Home / Self Care | Payer: Medicare Other | Source: Ambulatory Visit | Attending: Oncology | Admitting: Oncology

## 2018-10-23 DIAGNOSIS — Z87891 Personal history of nicotine dependence: Secondary | ICD-10-CM | POA: Insufficient documentation

## 2018-10-23 DIAGNOSIS — Z122 Encounter for screening for malignant neoplasm of respiratory organs: Secondary | ICD-10-CM | POA: Insufficient documentation

## 2018-10-24 ENCOUNTER — Telehealth: Payer: Self-pay

## 2018-10-24 NOTE — Telephone Encounter (Signed)
-----   Message from Margo Common, [place] sent at 10/23/2018  2:01 PM EST ----- Blood tests show an increase in RBC, Hgb, Hct and Creatinine level. Usually indicates need for better hydration. Should be rechecked by gastroenterologist if nausea persists.

## 2018-10-24 NOTE — Telephone Encounter (Signed)
Pt advised of labs and to drink more fluids.  dbs

## 2018-10-29 ENCOUNTER — Encounter: Payer: Self-pay | Admitting: *Deleted

## 2018-11-06 DIAGNOSIS — I442 Atrioventricular block, complete: Secondary | ICD-10-CM | POA: Diagnosis not present

## 2018-11-06 DIAGNOSIS — I10 Essential (primary) hypertension: Secondary | ICD-10-CM | POA: Diagnosis not present

## 2018-11-06 DIAGNOSIS — J449 Chronic obstructive pulmonary disease, unspecified: Secondary | ICD-10-CM | POA: Diagnosis not present

## 2018-11-06 DIAGNOSIS — G4733 Obstructive sleep apnea (adult) (pediatric): Secondary | ICD-10-CM | POA: Diagnosis not present

## 2018-11-06 DIAGNOSIS — I739 Peripheral vascular disease, unspecified: Secondary | ICD-10-CM | POA: Diagnosis not present

## 2018-12-10 ENCOUNTER — Encounter: Payer: Self-pay | Admitting: Family Medicine

## 2018-12-10 ENCOUNTER — Ambulatory Visit (INDEPENDENT_AMBULATORY_CARE_PROVIDER_SITE_OTHER): Payer: Medicare Other | Admitting: Family Medicine

## 2018-12-10 VITALS — BP 148/78 | HR 96 | Temp 98.5°F | Resp 16 | Ht 65.0 in | Wt 151.0 lb

## 2018-12-10 DIAGNOSIS — E7800 Pure hypercholesterolemia, unspecified: Secondary | ICD-10-CM | POA: Diagnosis not present

## 2018-12-10 DIAGNOSIS — I10 Essential (primary) hypertension: Secondary | ICD-10-CM | POA: Diagnosis not present

## 2018-12-10 DIAGNOSIS — J449 Chronic obstructive pulmonary disease, unspecified: Secondary | ICD-10-CM | POA: Diagnosis not present

## 2018-12-10 DIAGNOSIS — Z720 Tobacco use: Secondary | ICD-10-CM | POA: Diagnosis not present

## 2018-12-10 DIAGNOSIS — K529 Noninfective gastroenteritis and colitis, unspecified: Secondary | ICD-10-CM | POA: Diagnosis not present

## 2018-12-10 NOTE — Progress Notes (Signed)
Patient: Crystal White Female    DOB: Mar 23, 1941   78 y.o.   MRN: [id number] Visit Date: 12/10/2018  Today's Provider: [name], MD   Chief Complaint  Patient presents with  . Hypertension  . Hyperlipidemia  . Diarrhea   Subjective:     HPI   Hypertension, follow-up:  She was last seen for hypertension 6 months ago. BP at that visit was 132/82. Management since that visit includes no changes. She reports good compliance with treatment. She is not having side effects. She is not exercising. She is adherent to low salt diet. Outside blood pressures are checked occasionally. Patient denies exertional chest pressure/discomfort, lower extremity edema and palpitations. Cardiovascular risk factors include dyslipidemia.  BP Readings from Last 3 Encounters:  12/10/18 (!) 148/78  10/19/18 138/78  10/10/18 (!) 148/80   Wt Readings from Last 3 Encounters:  12/10/18 151 lb (68.5 kg)  10/23/18 152 lb (68.9 kg)  10/19/18 152 lb 3.2 oz (69 kg)     Lipid/Cholesterol, Follow-up:   Last seen for this 6 months ago. Management changes since that visit include no changes. She reports good compliance with treatment. She is not having side effects.  Last Lipid Panel:    Component Value Date/Time   CHOL 262 (H) 06/05/2018 1616   CHOL 230 (H) 12/24/2012 1108   TRIG 207 (H) 06/05/2018 1616   TRIG 279 (H) 12/24/2012 1108   HDL 52 06/05/2018 1616   HDL 42 12/24/2012 1108   CHOLHDL 5.0 (H) 06/05/2018 1616   VLDL 56 (H) 12/24/2012 1108   LDLCALC 169 (H) 06/05/2018 1616   LDLCALC 132 (H) 12/24/2012 1108   Diarrhea Patient reports that she has loose bowels that seems to be worsening. She reports that she has to take at least 2 tablets of imodium AD daily to help with her bowels. No pain or weight loss. No alarm symptoms. Allergies  Allergen Reactions  . Iodinated Diagnostic Agents Other (See Comments) and Rash    Pain and burning Reaction:  Burning and redness  Pain and  burning  . Other Rash    Pain and burning  . Iodine Other (See Comments)  . Statins Other (See Comments)    Reaction:  Leg cramps   . Bacitracin Swelling and Rash     Current Outpatient Medications:  .  acetaminophen (TYLENOL) 500 MG tablet, Take 500 mg by mouth every 6 (six) hours as needed., Disp: , Rfl:  .  albuterol (PROVENTIL HFA) 108 (90 Base) MCG/ACT inhaler, Inhale 2 puffs into the lungs every 6 (six) hours as needed for wheezing or shortness of breath., Disp: 3 each, Rfl: 3 .  ALPRAZolam (XANAX) 0.25 MG tablet, Take 1 tablet (0.25 mg total) by mouth 2 (two) times daily., Disp: 60 tablet, Rfl: 0 .  BREO ELLIPTA 100-25 MCG/INH AEPB, USE 1 INHALATION DAILY, Disp: 180 each, Rfl: 3 .  citalopram (CELEXA) 10 MG tablet, Take 1 tablet (10 mg total) by mouth at bedtime., Disp: 90 tablet, Rfl: 3 .  ezetimibe (ZETIA) 10 MG tablet, Take 1 tablet (10 mg total) by mouth daily., Disp: 90 tablet, Rfl: 3 .  gabapentin (NEURONTIN) 100 MG capsule, Take 2 capsules (200 mg total) by mouth 3 (three) times daily., Disp: 540 capsule, Rfl: 3 .  hydrochlorothiazide (HYDRODIURIL) 25 MG tablet, Take 1 tablet (25 mg total) by mouth daily., Disp: 90 tablet, Rfl: 3 .  hyoscyamine (LEVBID) 0.375 MG 12 hr tablet, Take 1 tablet (  0.375 mg total) by mouth 2 (two) times daily as needed for cramping. Reported on 05/03/2016, Disp: 180 tablet, Rfl: 3 .  losartan (COZAAR) 50 MG tablet, Take 1 tablet (50 mg total) by mouth at bedtime., Disp: 90 tablet, Rfl: 3 .  meclizine (ANTIVERT) 25 MG tablet, Take 1 tablet (25 mg total) by mouth 2 (two) times daily as needed for dizziness., Disp: 180 tablet, Rfl: 3 .  mirtazapine (REMERON) 30 MG tablet, Take 1 tablet (30 mg total) by mouth at bedtime., Disp: 90 tablet, Rfl: 3 .  ondansetron (ZOFRAN) 4 MG tablet, Take 1 tablet (4 mg total) by mouth every 8 (eight) hours as needed for nausea or vomiting., Disp: 20 tablet, Rfl: 0 .  OXYGEN, Inhale into the lungs daily. Uses at night with CPAP  and PRN during daytime, Disp: , Rfl:  .  pantoprazole (PROTONIX) 40 MG tablet, Take 1 tablet (40 mg total) by mouth 2 (two) times daily., Disp: 180 tablet, Rfl: 3 .  traMADol (ULTRAM) 50 MG tablet, Take 1 tablet (50 mg total) every 8 (eight) hours as needed by mouth., Disp: 90 tablet, Rfl: 0 .  traZODone (DESYREL) 150 MG tablet, Take 1 tablet (150 mg total) by mouth at bedtime., Disp: 90 tablet, Rfl: 3 .  alendronate (FOSAMAX) 70 MG tablet, Take 1 tablet (70 mg total) by mouth once a week. (Patient not taking: Reported on 10/10/2018), Disp: 12 tablet, Rfl: 3  Review of Systems  Constitutional: Positive for appetite change and fatigue.  Respiratory: Positive for cough and shortness of breath.        Has COPD. No change from baseline.   Gastrointestinal: Positive for diarrhea and nausea. Negative for abdominal distention, abdominal pain, anal bleeding, blood in stool, constipation and vomiting.  Neurological: Negative for dizziness, light-headedness and headaches.  Psychiatric/Behavioral: Negative for agitation, confusion, decreased concentration, dysphoric mood and self-injury. The patient is not nervous/anxious.     Social History   Tobacco Use  . Smoking status: Current Every Day Smoker    Packs/day: 1.00    Years: 55.00    Pack years: 55.00    Types: Cigarettes  . Smokeless tobacco: Never Used  Substance Use Topics  . Alcohol use: Not Currently    Alcohol/week: 0.0 standard drinks      Objective:   BP (!) 148/78 (BP Location: Left Arm, Patient Position: Sitting, Cuff Size: Normal)   Pulse 96   Temp 98.5 F (36.9 C)   Resp 16   Ht 5\' 5"  (1.651 m)   Wt 151 lb (68.5 kg)   SpO2 93%   BMI 25.13 kg/m  Vitals:   12/10/18 1331  BP: (!) 148/78  Pulse: 96  Resp: 16  Temp: 98.5 F (36.9 C)  SpO2: 93%  Weight: 151 lb (68.5 kg)  Height: 5\' 5"  (1.651 m)     Physical Exam Constitutional:      Appearance: She is well-developed.  HENT:     Head: Normocephalic and  atraumatic.     Right Ear: External ear normal.     Left Ear: External ear normal.     Nose: Nose normal.  Eyes:     General: No scleral icterus.    Conjunctiva/sclera: Conjunctivae normal.  Neck:     Thyroid: No thyromegaly.  Cardiovascular:     Rate and Rhythm: Normal rate and regular rhythm.     Heart sounds: Normal heart sounds.  Pulmonary:     Effort: Pulmonary effort is normal.  Breath sounds: Normal breath sounds.  Abdominal:     Palpations: Abdomen is soft.  Skin:    General: Skin is warm and dry.  Neurological:     Mental Status: She is alert and oriented to person, place, and time.  Psychiatric:        Mood and Affect: Mood normal.        Behavior: Behavior normal.        Thought Content: Thought content normal.        Judgment: Judgment normal.         Assessment & Plan    1. Essential (primary) hypertensionCon   2. Hypercholesteremia   3. Chronic obstructive pulmonary disease, unspecified COPD type ([place])   4. Current tobacco use Pt encouraged to stop completely.  5. Chronic diarrhea Consider Lactose or Gluten intolerance. Add Psyllium and probiotic today. - Ambulatory referral to Gastroenterology    I have done the exam and reviewed the above chart and it is accurate to the best of my knowledge. Development worker, community has been used in this note in any air is in the dictation or transcription are unintentional.  [name], MD  [place]

## 2018-12-10 NOTE — Patient Instructions (Signed)
Encouraged to stop smoking.  Start Probiotic once daily.  Start Metamucil over the counter 1 scoop daily.

## 2019-01-07 ENCOUNTER — Other Ambulatory Visit: Payer: Self-pay | Admitting: Gastroenterology

## 2019-01-07 DIAGNOSIS — R61 Generalized hyperhidrosis: Secondary | ICD-10-CM | POA: Diagnosis not present

## 2019-01-07 DIAGNOSIS — R1310 Dysphagia, unspecified: Secondary | ICD-10-CM | POA: Diagnosis not present

## 2019-01-07 DIAGNOSIS — R112 Nausea with vomiting, unspecified: Secondary | ICD-10-CM | POA: Diagnosis not present

## 2019-01-07 DIAGNOSIS — R1319 Other dysphagia: Secondary | ICD-10-CM

## 2019-01-07 DIAGNOSIS — R197 Diarrhea, unspecified: Secondary | ICD-10-CM | POA: Diagnosis not present

## 2019-01-10 ENCOUNTER — Other Ambulatory Visit
Admission: RE | Admit: 2019-01-10 | Discharge: 2019-01-10 | Disposition: A | Discharge status: Home / Self Care | Payer: Medicare Other | Source: Home / Self Care | Attending: Gastroenterology | Admitting: Gastroenterology

## 2019-01-10 ENCOUNTER — Ambulatory Visit
Admission: RE | Admit: 2019-01-10 | Discharge: 2019-01-10 | Disposition: A | Discharge status: Home / Self Care | Payer: Medicare Other | Source: Ambulatory Visit | Attending: Gastroenterology | Admitting: Gastroenterology

## 2019-01-10 DIAGNOSIS — R197 Diarrhea, unspecified: Secondary | ICD-10-CM | POA: Insufficient documentation

## 2019-01-10 DIAGNOSIS — R112 Nausea with vomiting, unspecified: Secondary | ICD-10-CM | POA: Diagnosis not present

## 2019-01-10 DIAGNOSIS — D3501 Benign neoplasm of right adrenal gland: Secondary | ICD-10-CM | POA: Diagnosis not present

## 2019-01-10 DIAGNOSIS — N200 Calculus of kidney: Secondary | ICD-10-CM | POA: Diagnosis not present

## 2019-01-10 LAB — GASTROINTESTINAL PANEL BY PCR, STOOL (REPLACES STOOL CULTURE)
ASTROVIRUS: NOT DETECTED
Adenovirus F40/41: NOT DETECTED
CYCLOSPORA CAYETANENSIS: NOT DETECTED
Campylobacter species: NOT DETECTED
Cryptosporidium: NOT DETECTED
Entamoeba histolytica: NOT DETECTED
Enteroaggregative E coli (EAEC): NOT DETECTED
Enteropathogenic E coli (EPEC): NOT DETECTED
Enterotoxigenic E coli (ETEC): NOT DETECTED
Giardia lamblia: NOT DETECTED
Norovirus GI/GII: NOT DETECTED
Plesimonas shigelloides: NOT DETECTED
Rotavirus A: NOT DETECTED
SHIGA LIKE TOXIN PRODUCING E COLI (STEC): NOT DETECTED
Salmonella species: NOT DETECTED
Sapovirus (I, II, IV, and V): NOT DETECTED
Shigella/Enteroinvasive E coli (EIEC): NOT DETECTED
VIBRIO SPECIES: NOT DETECTED
Vibrio cholerae: NOT DETECTED
Yersinia enterocolitica: NOT DETECTED

## 2019-01-10 LAB — C DIFFICILE QUICK SCREEN W PCR REFLEX
C DIFFICILE (CDIFF) TOXIN: NEGATIVE
C Diff antigen: NEGATIVE
C Diff interpretation: NOT DETECTED

## 2019-01-11 ENCOUNTER — Other Ambulatory Visit
Admission: RE | Admit: 2019-01-11 | Discharge: 2019-01-11 | Disposition: A | Discharge status: Home / Self Care | Payer: Medicare Other | Attending: Gastroenterology | Admitting: Gastroenterology

## 2019-01-11 LAB — CALPROTECTIN, FECAL: Calprotectin, Fecal: 31 ug/g (ref 0–120)

## 2019-01-14 ENCOUNTER — Ambulatory Visit
Admission: RE | Admit: 2019-01-14 | Discharge: 2019-01-14 | Disposition: A | Discharge status: Home / Self Care | Payer: Medicare Other | Source: Ambulatory Visit | Attending: Gastroenterology | Admitting: Gastroenterology

## 2019-01-14 DIAGNOSIS — R1319 Other dysphagia: Secondary | ICD-10-CM

## 2019-01-14 DIAGNOSIS — R1310 Dysphagia, unspecified: Secondary | ICD-10-CM | POA: Diagnosis not present

## 2019-01-23 LAB — MISCELLANEOUS TEST

## 2019-01-24 ENCOUNTER — Ambulatory Visit: Payer: Medicare Other | Admitting: Certified Registered"

## 2019-01-24 ENCOUNTER — Ambulatory Visit
Admission: RE | Admit: 2019-01-24 | Discharge: 2019-01-24 | Disposition: A | Discharge status: Home / Self Care | Payer: Medicare Other | Attending: Gastroenterology | Admitting: Gastroenterology

## 2019-01-24 ENCOUNTER — Encounter
Admission: RE | Disposition: A | Discharge status: Home / Self Care | Payer: Self-pay | Source: Home / Self Care | Attending: Gastroenterology

## 2019-01-24 DIAGNOSIS — Z888 Allergy status to other drugs, medicaments and biological substances status: Secondary | ICD-10-CM | POA: Insufficient documentation

## 2019-01-24 DIAGNOSIS — M479 Spondylosis, unspecified: Secondary | ICD-10-CM | POA: Diagnosis not present

## 2019-01-24 DIAGNOSIS — R197 Diarrhea, unspecified: Secondary | ICD-10-CM | POA: Diagnosis present

## 2019-01-24 DIAGNOSIS — R634 Abnormal weight loss: Secondary | ICD-10-CM | POA: Insufficient documentation

## 2019-01-24 DIAGNOSIS — J449 Chronic obstructive pulmonary disease, unspecified: Secondary | ICD-10-CM | POA: Diagnosis not present

## 2019-01-24 DIAGNOSIS — K225 Diverticulum of esophagus, acquired: Secondary | ICD-10-CM | POA: Insufficient documentation

## 2019-01-24 DIAGNOSIS — Z980 Intestinal bypass and anastomosis status: Secondary | ICD-10-CM | POA: Diagnosis not present

## 2019-01-24 DIAGNOSIS — K2270 Barrett's esophagus without dysplasia: Secondary | ICD-10-CM | POA: Diagnosis not present

## 2019-01-24 DIAGNOSIS — K2950 Unspecified chronic gastritis without bleeding: Secondary | ICD-10-CM | POA: Insufficient documentation

## 2019-01-24 DIAGNOSIS — M19042 Primary osteoarthritis, left hand: Secondary | ICD-10-CM | POA: Diagnosis not present

## 2019-01-24 DIAGNOSIS — K228 Other specified diseases of esophagus: Secondary | ICD-10-CM | POA: Diagnosis not present

## 2019-01-24 DIAGNOSIS — Z7951 Long term (current) use of inhaled steroids: Secondary | ICD-10-CM | POA: Insufficient documentation

## 2019-01-24 DIAGNOSIS — G629 Polyneuropathy, unspecified: Secondary | ICD-10-CM | POA: Diagnosis not present

## 2019-01-24 DIAGNOSIS — Z86010 Personal history of colonic polyps: Secondary | ICD-10-CM | POA: Diagnosis not present

## 2019-01-24 DIAGNOSIS — K219 Gastro-esophageal reflux disease without esophagitis: Secondary | ICD-10-CM | POA: Diagnosis not present

## 2019-01-24 DIAGNOSIS — K224 Dyskinesia of esophagus: Secondary | ICD-10-CM | POA: Diagnosis not present

## 2019-01-24 DIAGNOSIS — K633 Ulcer of intestine: Secondary | ICD-10-CM | POA: Insufficient documentation

## 2019-01-24 DIAGNOSIS — K3189 Other diseases of stomach and duodenum: Secondary | ICD-10-CM | POA: Diagnosis not present

## 2019-01-24 DIAGNOSIS — I10 Essential (primary) hypertension: Secondary | ICD-10-CM | POA: Insufficient documentation

## 2019-01-24 DIAGNOSIS — K5730 Diverticulosis of large intestine without perforation or abscess without bleeding: Secondary | ICD-10-CM | POA: Insufficient documentation

## 2019-01-24 DIAGNOSIS — G4733 Obstructive sleep apnea (adult) (pediatric): Secondary | ICD-10-CM | POA: Diagnosis not present

## 2019-01-24 DIAGNOSIS — Z9981 Dependence on supplemental oxygen: Secondary | ICD-10-CM | POA: Diagnosis not present

## 2019-01-24 DIAGNOSIS — Z79899 Other long term (current) drug therapy: Secondary | ICD-10-CM | POA: Diagnosis not present

## 2019-01-24 DIAGNOSIS — Z8719 Personal history of other diseases of the digestive system: Secondary | ICD-10-CM | POA: Insufficient documentation

## 2019-01-24 DIAGNOSIS — D126 Benign neoplasm of colon, unspecified: Secondary | ICD-10-CM | POA: Diagnosis not present

## 2019-01-24 DIAGNOSIS — K635 Polyp of colon: Secondary | ICD-10-CM | POA: Diagnosis not present

## 2019-01-24 DIAGNOSIS — R1310 Dysphagia, unspecified: Secondary | ICD-10-CM | POA: Diagnosis present

## 2019-01-24 DIAGNOSIS — K259 Gastric ulcer, unspecified as acute or chronic, without hemorrhage or perforation: Secondary | ICD-10-CM | POA: Diagnosis not present

## 2019-01-24 DIAGNOSIS — K2210 Ulcer of esophagus without bleeding: Secondary | ICD-10-CM | POA: Diagnosis not present

## 2019-01-24 DIAGNOSIS — Z950 Presence of cardiac pacemaker: Secondary | ICD-10-CM | POA: Insufficient documentation

## 2019-01-24 DIAGNOSIS — K5289 Other specified noninfective gastroenteritis and colitis: Secondary | ICD-10-CM | POA: Diagnosis not present

## 2019-01-24 DIAGNOSIS — M19041 Primary osteoarthritis, right hand: Secondary | ICD-10-CM | POA: Diagnosis not present

## 2019-01-24 DIAGNOSIS — E785 Hyperlipidemia, unspecified: Secondary | ICD-10-CM | POA: Insufficient documentation

## 2019-01-24 HISTORY — PX: COLONOSCOPY WITH PROPOFOL: SHX5780

## 2019-01-24 HISTORY — PX: ESOPHAGOGASTRODUODENOSCOPY (EGD) WITH PROPOFOL: SHX5813

## 2019-01-24 SURGERY — COLONOSCOPY WITH PROPOFOL
Anesthesia: General

## 2019-01-24 MED ORDER — FENTANYL CITRATE (PF) 100 MCG/2ML IJ SOLN
INTRAMUSCULAR | Status: AC
  Filled 2019-01-24: qty 2

## 2019-01-24 MED ORDER — FENTANYL CITRATE (PF) 100 MCG/2ML IJ SOLN
INTRAMUSCULAR | Status: DC | PRN
  Administered 2019-01-24: 50 ug via INTRAVENOUS
  Administered 2019-01-24 (×2): 25 ug via INTRAVENOUS

## 2019-01-24 MED ORDER — PROPOFOL 10 MG/ML IV BOLUS
INTRAVENOUS | Status: AC
  Filled 2019-01-24: qty 40

## 2019-01-24 MED ORDER — PROPOFOL 500 MG/50ML IV EMUL
INTRAVENOUS | Status: DC | PRN
  Administered 2019-01-24: 125 ug/kg/min via INTRAVENOUS

## 2019-01-24 MED ORDER — LIDOCAINE HCL (CARDIAC) PF 100 MG/5ML IV SOSY
PREFILLED_SYRINGE | INTRAVENOUS | Status: DC | PRN
  Administered 2019-01-24: 80 mg via INTRAVENOUS

## 2019-01-24 MED ORDER — PROPOFOL 10 MG/ML IV BOLUS
INTRAVENOUS | Status: AC
  Filled 2019-01-24: qty 20

## 2019-01-24 MED ORDER — PROPOFOL 10 MG/ML IV BOLUS
INTRAVENOUS | Status: DC | PRN
  Administered 2019-01-24: 50 mg via INTRAVENOUS

## 2019-01-24 MED ORDER — SODIUM CHLORIDE 0.9 % IV SOLN
INTRAVENOUS | Status: DC
  Administered 2019-01-24: 1000 mL via INTRAVENOUS

## 2019-01-24 NOTE — Transfer of Care (Signed)
Immediate Anesthesia Transfer of Care Note  Patient: Crystal White  Procedure(s) Performed: COLONOSCOPY WITH PROPOFOL (N/A ) ESOPHAGOGASTRODUODENOSCOPY (EGD) WITH PROPOFOL (N/A )  Patient Location: PACU  Anesthesia Type:General  Level of Consciousness: sedated  Airway & Oxygen Therapy: Patient Spontanous Breathing and Patient connected to nasal cannula oxygen  Post-op Assessment: Report given to RN and Post -op Vital signs reviewed and stable  Post vital signs: Reviewed and stable  Last Vitals:  Vitals Value Taken Time  BP 110/70 01/24/2019 11:30 AM  Temp 36.7 C 01/24/2019 11:29 AM  Pulse 89 01/24/2019 11:31 AM  Resp 19 01/24/2019 11:31 AM  SpO2 96 % 01/24/2019 11:31 AM  Vitals shown include unvalidated device data.  Last Pain:  Vitals:   01/24/19 1129  TempSrc: Tympanic  PainSc:          Complications: No apparent anesthesia complications

## 2019-01-24 NOTE — Anesthesia Post-op Follow-up Note (Signed)
Anesthesia QCDR form completed.        

## 2019-01-24 NOTE — H&P (Signed)
Outpatient short stay form Pre-procedure 01/24/2019 9:35 AM Crystal Sails MD  Primary Physician: Crystal Aschoff, MD  Reason for visit: EGD and colonoscopy  History of present illness: Patient is a 78 year old female presenting today for an EGD and colonoscopy in regards to problems with difficulty swallowing, weight loss, nausea and vomiting and diarrhea.  Does have a history of Barrett's esophagus as well.  She has had adenomatous polyps as well on previous colonoscopy.  Patient has a history of a paraesophageal hernia that was repaired surgically in 12/27/2007.  Has had several dilatations for these do not provide any long-term benefit for her.  He has had a ileocectomy.  She had a barium swallow on 01/14/2019 this showing a Zenker's diverticulum, this enlarged from previous evaluation as well as a possible distal narrowing in the region of her slipped Nissen fundoplication.  There is also a marked evidence of presbyesophagus.  Explained these changes to patient and her daughter.    Current Facility-Administered Medications:  .  0.9 %  sodium chloride infusion, , Intravenous, Continuous, Crystal Sails, MD  Medications Prior to Admission  Medication Sig Dispense Refill Last Dose  . acetaminophen (TYLENOL) 500 MG tablet Take 500 mg by mouth every 6 (six) hours as needed.   Past Week at Unknown time  . albuterol (PROVENTIL HFA) 108 (90 Base) MCG/ACT inhaler Inhale 2 puffs into the lungs every 6 (six) hours as needed for wheezing or shortness of breath. 3 each 3 Past Week at Unknown time  . alendronate (FOSAMAX) 70 MG tablet Take 1 tablet (70 mg total) by mouth once a week. 12 tablet 3 Past Week at Unknown time  . ALPRAZolam (XANAX) 0.25 MG tablet Take 1 tablet (0.25 mg total) by mouth 2 (two) times daily. 60 tablet 0 Past Week at Unknown time  . BREO ELLIPTA 100-25 MCG/INH AEPB USE 1 INHALATION DAILY 180 each 3 Past Week at Unknown time  . citalopram (CELEXA) 10 MG tablet Take 1  tablet (10 mg total) by mouth at bedtime. 90 tablet 3 Past Week at Unknown time  . ezetimibe (ZETIA) 10 MG tablet Take 1 tablet (10 mg total) by mouth daily. 90 tablet 3 Past Week at Unknown time  . gabapentin (NEURONTIN) 100 MG capsule Take 2 capsules (200 mg total) by mouth 3 (three) times daily. 540 capsule 3 Past Week at Unknown time  . hydrochlorothiazide (HYDRODIURIL) 25 MG tablet Take 1 tablet (25 mg total) by mouth daily. 90 tablet 3 Past Week at Unknown time  . hyoscyamine (LEVBID) 0.375 MG 12 hr tablet Take 1 tablet (0.375 mg total) by mouth 2 (two) times daily as needed for cramping. Reported on 05/03/2016 180 tablet 3 Past Week at Unknown time  . losartan (COZAAR) 50 MG tablet Take 1 tablet (50 mg total) by mouth at bedtime. 90 tablet 3 Past Week at Unknown time  . meclizine (ANTIVERT) 25 MG tablet Take 1 tablet (25 mg total) by mouth 2 (two) times daily as needed for dizziness. 180 tablet 3 Past Week at Unknown time  . mirtazapine (REMERON) 30 MG tablet Take 1 tablet (30 mg total) by mouth at bedtime. 90 tablet 3 Past Week at Unknown time  . ondansetron (ZOFRAN) 4 MG tablet Take 1 tablet (4 mg total) by mouth every 8 (eight) hours as needed for nausea or vomiting. 20 tablet 0 Past Week at Unknown time  . OXYGEN Inhale into the lungs daily. Uses at night with CPAP and PRN during daytime  Past Week at Unknown time  . pantoprazole (PROTONIX) 40 MG tablet Take 1 tablet (40 mg total) by mouth 2 (two) times daily. 180 tablet 3 Past Week at Unknown time  . traMADol (ULTRAM) 50 MG tablet Take 1 tablet (50 mg total) every 8 (eight) hours as needed by mouth. 90 tablet 0 Past Week at Unknown time  . traZODone (DESYREL) 150 MG tablet Take 1 tablet (150 mg total) by mouth at bedtime. 90 tablet 3 Past Week at Unknown time     Allergies  Allergen Reactions  . Iodinated Diagnostic Agents Other (See Comments)    Burning sensation during contrast media injections. Patient states the IV contrast makes  her very hot. She has had multiple CT Scans with IV contrast and states she only gets the warm sensation. This is not an allergic reaction, but is noted in her Index chart as well. Aggie Hacker, ARRT R CT, had this discussion with her on 01/10/19 at 1130am.  . Statins Other (See Comments)    Reaction:  Leg cramps   . Bacitracin Swelling and Rash     Past Medical History:  Diagnosis Date  . Arthritis    hands, lower back  . Barrett's esophagus   . COPD (chronic obstructive pulmonary disease) ([place])   . Emphysema lung ([place])   . GERD (gastroesophageal reflux disease)   . Hernia of abdominal cavity   . Hyperlipidemia   . Hypertension   . Mobitz type II atrioventricular block   . OSA (obstructive sleep apnea)    on CPAP  . Peripheral neuropathy   . Presence of permanent cardiac pacemaker 09/22/11   Biotronik - EVIADR-T [facility])  . Shortness of breath dyspnea   . Stomach ulcer     Review of systems:      Physical Exam    Heart and lungs: Regular rate and rhythm without rub or gallop, lungs are bilaterally clear.    HEENT: Normocephalic atraumatic eyes are anicteric    Other:    Pertinant exam for procedure: Soft nontender nondistended bowel sounds positive normoactive.  There are no masses or rebound.    Planned proceedures: EGD, colonoscopy and indicated procedures. I have discussed the risks benefits and complications of procedures to include not limited to bleeding, infection, perforation and the risk of sedation and the patient wishes to proceed.    Crystal Sails, MD Gastroenterology 01/24/2019  9:35 AM

## 2019-01-24 NOTE — Op Note (Signed)
[facility] Gastroenterology Patient Name: Crystal White Procedure Date: 01/24/2019 10:05 AM MRN: [id number] Account #: [id number] Date of Birth: 07-26-1941 Admit Type: Outpatient Age: 78 Room: [facility] ENDO ROOM 1 Gender: Female Note Status: Finalized Procedure:            Colonoscopy Indications:          Chronic diarrhea, Personal history of colonic polyps,                        Weight loss Providers:            Lollie Sails, MD Referring MD:         Janine Ores. Rosanna Randy, MD (Referring MD) Medicines:            Monitored Anesthesia Care Complications:        No immediate complications. Procedure:            Pre-Anesthesia Assessment:                       - ASA Grade Assessment: III - A patient with severe                        systemic disease.                       After obtaining informed consent, the colonoscope was                        passed under direct vision. Throughout the procedure,                        the patient's blood pressure, pulse, and oxygen                        saturations were monitored continuously. The                        Colonoscope was introduced through the anus and                        advanced to the the ileocolonic anastomosis. The                        colonoscopy was performed without difficulty. The                        patient tolerated the procedure well. The quality of                        the bowel preparation was good. Findings:      Multiple small and large-mouthed diverticula were found in the rectum,       sigmoid colon and descending colon.      There was evidence of a prior functional end-to-end ileo-colonic       anastomosis at 90 cm proximal to the anus. This was characterized by       erosion and severe stenosis. The anastomosis was not traversed. This was       biopsied with a cold forceps for histology at the erosion, ibnside of       the stenosis and about 1-2 cm distal to the stenosis.  A  single (solitary) ten mm ulcer was found at 28 cm proximal to the       anus. No bleeding was present. No stigmata of recent bleeding were seen.       Biopsies were taken with a cold forceps for histology.      Biopsies for histology were taken with a cold forceps from the right       colon and left colon for evaluation of microscopic colitis. Impression:           - Diverticulosis in the rectum, in the sigmoid colon                        and in the descending colon.                       - Functional end-to-end ileo-colonic anastomosis,                        characterized by erosion and severe stenosis. Biopsied.                       - A single (solitary) ulcer at 28 cm proximal to the                        anus. Biopsied.                       - Biopsies were taken with a cold forceps from the                        right colon and left colon for evaluation of                        microscopic colitis. Recommendation:       - Discharge patient to home.                       - Await biopsy results, consider CT colonography                        depending on the biopsy results. Consider                        cholestyramine versus 5-asa, depending on biopsy                        results. Procedure Code(s):    --- Professional ---                       [phone], Colonoscopy, flexible; with biopsy, single or                        multiple Diagnosis Code(s):    --- Professional ---                       Z98.0, Intestinal bypass and anastomosis status                       K63.3, Ulcer of intestine                       K52.9,  Noninfective gastroenteritis and colitis,                        unspecified                       Z86.010, Personal history of colonic polyps                       R63.4, Abnormal weight loss                       K57.30, Diverticulosis of large intestine without                        perforation or abscess without bleeding CPT copyright 2018 American Medical  Association. All rights reserved. The codes documented in this report are preliminary and upon coder review may  be revised to meet current compliance requirements. Lollie Sails, MD 01/24/2019 11:35:09 AM This report has been signed electronically. Number of Addenda: 0 Note Initiated On: 01/24/2019 10:05 AM Scope Withdrawal Time: 0 hours 23 minutes 8 seconds  Total Procedure Duration: 0 hours 28 minutes 2 seconds       [facility]

## 2019-01-24 NOTE — Anesthesia Preprocedure Evaluation (Signed)
Anesthesia Evaluation  Patient identified by MRN, date of birth, ID band Patient awake    Reviewed: Allergy & Precautions, H&P , NPO status , Patient's Chart, lab work & pertinent test results, reviewed documented beta blocker date and time   Airway Mallampati: II   Neck ROM: full    Dental  (+) Poor Dentition   Pulmonary shortness of breath and with exertion, sleep apnea , COPD, Current Smoker,    Pulmonary exam normal        Cardiovascular Exercise Tolerance: Poor hypertension, On Medications Normal cardiovascular exam+ dysrhythmias + pacemaker  Rhythm:regular Rate:Normal     Neuro/Psych PSYCHIATRIC DISORDERS Anxiety Depression  Neuromuscular disease    GI/Hepatic Neg liver ROS, PUD, GERD  ,  Endo/Other  negative endocrine ROS  Renal/GU Renal disease  negative genitourinary   Musculoskeletal   Abdominal   Peds  Hematology negative hematology ROS (+)   Anesthesia Other Findings Past Medical History: No date: Arthritis     Comment:  hands, lower back No date: Barrett's esophagus No date: COPD (chronic obstructive pulmonary disease) ([facility]) No date: Emphysema lung ([facility]) No date: GERD (gastroesophageal reflux disease) No date: Hernia of abdominal cavity No date: Hyperlipidemia No date: Hypertension No date: Mobitz type II atrioventricular block No date: OSA (obstructive sleep apnea)     Comment:  on CPAP No date: Peripheral neuropathy 09/22/11: Presence of permanent cardiac pacemaker     Comment:  Biotronik - EVIADR-T [facility]) No date: Shortness of breath dyspnea No date: Stomach ulcer Past Surgical History: 2006: APPENDECTOMY     Comment:  Dr. Pat Patrick 05/03/2016: BROW LIFT; Bilateral     Comment:  Procedure: BLEPHAROPLASTY  BILATERAL UPPER EYELIDS WITH               EXCESS SKIN REMOVAL BILATERAL BROW PTOSIS REPAIR               BLEPHAROPTOSIS REPAIR BILATERAL EYES ;  Surgeon: Karle Starch, MD;   Location: [place];  Service:               Ophthalmology;  Laterality: Bilateral; 2007: CARDIAC CATHETERIZATION No date: COLON SURGERY 04/14/2015: COLONOSCOPY WITH PROPOFOL; N/A     Comment:  Procedure: COLONOSCOPY WITH PROPOFOL;  Surgeon: Lollie Sails, MD;  Location[facility] ENDOSCOPY;  Service:               Endoscopy;  Laterality: N/A; 04/14/2015: ESOPHAGOGASTRODUODENOSCOPY; N/A     Comment:  Procedure: ESOPHAGOGASTRODUODENOSCOPY (EGD);  Surgeon:               Lollie Sails, MD;  Location: [facility] ENDOSCOPY;                Service: Endoscopy;  Laterality: N/A; 07/05/2017: ESOPHAGOGASTRODUODENOSCOPY (EGD) WITH PROPOFOL; N/A     Comment:  Procedure: ESOPHAGOGASTRODUODENOSCOPY (EGD) WITH               PROPOFOL;  Surgeon: Manya Silvas, MD;  Location:               [facility] ENDOSCOPY;  Service: Endoscopy;  Laterality: N/A; 2007: HIATAL HERNIA REPAIR 06/22/2015: LAPAROSCOPIC RIGHT HEMI COLECTOMY; Right     Comment:  Procedure: LAPAROSCOPIC RIGHT HEMI COLECTOMY;  Surgeon:               Marlyce Huge, MD;  Location: [facility] ORS;  Service:              General;  Laterality: Right; 8366: NISSEN FUNDOPLICATION 29/47/65: PACEMAKER INSERTION     Comment:  [facility]  - Biotronik EVIADR-T 1980: TOTAL ABDOMINAL HYSTERECTOMY BMI    Body Mass Index:  23.30 kg/m     Reproductive/Obstetrics negative OB ROS                             Anesthesia Physical Anesthesia Plan  ASA: III  Anesthesia Plan: General   Post-op Pain Management:    Induction:   PONV Risk Score and Plan:   Airway Management Planned:   Additional Equipment:   Intra-op Plan:   Post-operative Plan:   Informed Consent: I have reviewed the patients History and Physical, chart, labs and discussed the procedure including the risks, benefits and alternatives for the proposed anesthesia with the patient or authorized representative who has indicated his/her understanding and  acceptance.     Dental Advisory Given  Plan Discussed with: CRNA  Anesthesia Plan Comments:         Anesthesia Quick Evaluation

## 2019-01-24 NOTE — Op Note (Signed)
[facility] Gastroenterology Patient Name: Crystal White Procedure Date: 01/24/2019 10:05 AM MRN: [id number] Account #: [id number] Date of Birth: 08-31-1941 Admit Type: Outpatient Age: 78 Room: [facility] [place] ENDO ROOM 1 Gender: Female Note Status: Finalized Procedure:            Upper GI endoscopy Indications:          Dyspepsia, Dysphagia, Nausea with vomiting Providers:            Lollie Sails, MD Referring MD:         Janine Ores. Rosanna Randy, MD (Referring MD) Medicines:            Monitored Anesthesia Care Complications:        No immediate complications. Procedure:            Pre-Anesthesia Assessment:                       - ASA Grade Assessment: III - A patient with severe                        systemic disease.                       After obtaining informed consent, the endoscope was                        passed under direct vision. Throughout the procedure,                        the patient's blood pressure, pulse, and oxygen                        saturations were monitored continuously. The Endoscope                        was introduced through the mouth, and advanced to the                        fourth part of duodenum. The upper GI endoscopy was                        accomplished without difficulty. The patient tolerated                        the procedure well. Findings:      A non-bleeding Zenker's diverticulum with a small to medium opening, no       impacted food and no stigmata of recent bleeding was found.      Abnormal motility was noted in the middle third of the esophagus and in       the lower third of the esophagus. There is spasticity of the esophageal       body. The distal esophagus/lower esophageal sphincter is open. Tertiary       peristaltic waves are noted.      There were esophageal mucosal changes secondary to established       short-segment Barrett's disease present in the distal esophagus. The       maximum longitudinal extent of  these mucosal changes was 1 cm in length.       Mucosa was biopsied with a cold forceps for histology in a targeted       manner  and in 4 quadrants. One specimen bottle was sent to pathology.      A single 4 mm mucosal nodule with a localized distribution was found in       the distal esophagus. Biopsies were taken with a cold forceps for       histology.      Diffuse and patchy mild inflammation characterized by congestion       (edema), erosions and erythema was found in the gastric body and in the       gastric antrum. Biopsies were taken with a cold forceps for histology.       Biopsies were taken with a cold forceps for Helicobacter pylori testing.      Patchy moderate mucosal changes characterized by granularity and altered       texture were found in the prepyloric region of the stomach. Biopsies       were taken with a cold forceps for histology.      Evidence of a fundoplication was found in the cardia. The wrap appeared       not intact on retroflexion evaluation. The lumen in the area of the       distal esophagus showed no intrinsic or fixed stenosis, and appeared       laterally elongated. This was traversed.      Diffuse mild mucosal variance characterized by atrophy and flattening       was found in the entire duodenum. Biopsies were taken with a cold       forceps for histology. Impression:           - Zenker's diverticulum.                       - Abnormal esophageal motility, consistent with                        presbyesophagus.                       - Esophageal mucosal changes secondary to established                        short-segment Barrett's disease. Biopsied.                       - Mucosal nodule found in the esophagus. Biopsied.                       - Erosive gastritis. Biopsied.                       - Granular and texture changed mucosa in the prepyloric                        region of the stomach. Biopsied.                       - A fundoplication was  found. The wrap appears not                        intact.                       - Mucosal variant in the duodenum. Biopsied. Recommendation:       - Continue present medications.                       -  Await pathology results.                       - Return to GI clinic in 3 weeks. Procedure Code(s):    --- Professional ---                       [phone], Esophagogastroduodenoscopy, flexible, transoral;                        with biopsy, single or multiple Diagnosis Code(s):    --- Professional ---                       K22.5, Diverticulum of esophagus, acquired                       K22.4, Dyskinesia of esophagus                       K22.70, Barrett's esophagus without dysplasia                       K22.8, Other specified diseases of esophagus                       K29.60, Other gastritis without bleeding                       K31.89, Other diseases of stomach and duodenum                       K91.89, Other postprocedural complications and                        disorders of digestive system                       R10.13, Epigastric pain                       R13.10, Dysphagia, unspecified                       R11.2, Nausea with vomiting, unspecified CPT copyright 2018 [employer]. All rights reserved. The codes documented in this report are preliminary and upon coder review may  be revised to meet current compliance requirements. Lollie Sails, MD 01/24/2019 10:51:24 AM This report has been signed electronically. Number of Addenda: 0 Note Initiated On: 01/24/2019 10:05 AM      [facility]

## 2019-01-25 ENCOUNTER — Encounter: Payer: Self-pay | Admitting: Gastroenterology

## 2019-01-25 LAB — SURGICAL PATHOLOGY

## 2019-01-28 ENCOUNTER — Other Ambulatory Visit: Payer: Self-pay | Admitting: Family Medicine

## 2019-02-02 NOTE — Anesthesia Postprocedure Evaluation (Signed)
Anesthesia Post Note  Patient: TAIS KOESTNER  Procedure(s) Performed: COLONOSCOPY WITH PROPOFOL (N/A ) ESOPHAGOGASTRODUODENOSCOPY (EGD) WITH PROPOFOL (N/A )  Patient location during evaluation: PACU Anesthesia Type: General Level of consciousness: awake and alert Pain management: pain level controlled Vital Signs Assessment: post-procedure vital signs reviewed and stable Respiratory status: spontaneous breathing, nonlabored ventilation, respiratory function stable and patient connected to nasal cannula oxygen Cardiovascular status: blood pressure returned to baseline and stable Postop Assessment: no apparent nausea or vomiting Anesthetic complications: no     Last Vitals:  Vitals:   01/24/19 1151 01/24/19 1159  BP: (!) 139/58 (!) 141/73  Pulse: 64 62  Resp: (!) 21 18  Temp:    SpO2: 99% 99%    Last Pain:  Vitals:   01/25/19 0721  TempSrc:   PainSc: 0-No pain                 Molli Barrows

## 2019-02-11 DIAGNOSIS — J449 Chronic obstructive pulmonary disease, unspecified: Secondary | ICD-10-CM | POA: Diagnosis not present

## 2019-02-27 DIAGNOSIS — D126 Benign neoplasm of colon, unspecified: Secondary | ICD-10-CM | POA: Diagnosis not present

## 2019-02-27 DIAGNOSIS — K633 Ulcer of intestine: Secondary | ICD-10-CM | POA: Diagnosis not present

## 2019-02-27 DIAGNOSIS — K2270 Barrett's esophagus without dysplasia: Secondary | ICD-10-CM | POA: Diagnosis not present

## 2019-03-14 DIAGNOSIS — J449 Chronic obstructive pulmonary disease, unspecified: Secondary | ICD-10-CM | POA: Diagnosis not present

## 2019-03-26 ENCOUNTER — Other Ambulatory Visit: Payer: Self-pay

## 2019-03-26 ENCOUNTER — Ambulatory Visit (INDEPENDENT_AMBULATORY_CARE_PROVIDER_SITE_OTHER): Payer: Medicare Other | Admitting: Family Medicine

## 2019-03-26 DIAGNOSIS — F324 Major depressive disorder, single episode, in partial remission: Secondary | ICD-10-CM | POA: Diagnosis not present

## 2019-03-26 DIAGNOSIS — F5101 Primary insomnia: Secondary | ICD-10-CM

## 2019-03-26 DIAGNOSIS — K2100 Gastro-esophageal reflux disease with esophagitis, without bleeding: Secondary | ICD-10-CM

## 2019-03-26 DIAGNOSIS — I10 Essential (primary) hypertension: Secondary | ICD-10-CM

## 2019-03-26 DIAGNOSIS — F419 Anxiety disorder, unspecified: Secondary | ICD-10-CM | POA: Diagnosis not present

## 2019-03-26 DIAGNOSIS — Z720 Tobacco use: Secondary | ICD-10-CM

## 2019-03-26 MED ORDER — CITALOPRAM HYDROBROMIDE 20 MG PO TABS: 20.0000 mg | ORAL_TABLET | Freq: Every day | ORAL | 11 refills | Status: DC

## 2019-03-26 MED ORDER — MIRTAZAPINE 45 MG PO TABS: 45.0000 mg | ORAL_TABLET | Freq: Every day | ORAL | 11 refills | Status: DC

## 2019-03-26 NOTE — Progress Notes (Signed)
Patient: Crystal White Female    DOB: 1941-04-27   78 y.o.   MRN: [id number] Visit Date: 03/26/2019  Today's Provider: [name], MD    Subjective:    Virtual Visit via Video Note  I connected with [name] on 03/26/19 at  8:20 AM EDT by a video enabled telemedicine application and verified that I am speaking with the correct person using two identifiers.   I discussed the limitations of evaluation and management by telemedicine and the availability of in person appointments. The patient expressed understanding and agreed to proceed.  History of Present Illness: Pt has been in her home for 2 months due to cornovirus pandemic and has been feeling more anxious recently. Some trouble sleeping.Not homicidal or suicidal. She lives with her grandson who is working from home. She has not really left the house in 2 months other than going to sit on her deck. She continues to smoke with no plans to quit. Overall she feels well . She says she is losing weight due to limited food options.    Allergies  Allergen Reactions  . Iodinated Diagnostic Agents Other (See Comments)    Burning sensation during contrast media injections. Patient states the IV contrast makes her very hot. She has had multiple CT Scans with IV contrast and states she only gets the warm sensation. This is not an allergic reaction, but is noted in her [place] chart as well. Aggie Hacker, ARRT R CT, had this discussion with her on 01/10/19 at 1130am.  . Statins Other (See Comments)    Reaction:  Leg cramps   . Bacitracin Swelling and Rash     Current Outpatient Medications:  .  acetaminophen (TYLENOL) 500 MG tablet, Take 500 mg by mouth every 6 (six) hours as needed., Disp: , Rfl:  .  albuterol (PROVENTIL HFA) 108 (90 Base) MCG/ACT inhaler, Inhale 2 puffs into the lungs every 6 (six) hours as needed for wheezing or shortness of breath., Disp: 3 each, Rfl: 3 .  alendronate (FOSAMAX) 70 MG tablet,  Take 1 tablet (70 mg total) by mouth once a week., Disp: 12 tablet, Rfl: 3 .  ALPRAZolam (XANAX) 0.25 MG tablet, Take 1 tablet (0.25 mg total) by mouth 2 (two) times daily., Disp: 60 tablet, Rfl: 0 .  BREO ELLIPTA 100-25 MCG/INH AEPB, USE 1 INHALATION DAILY, Disp: 180 each, Rfl: 3 .  citalopram (CELEXA) 10 MG tablet, Take 1 tablet (10 mg total) by mouth at bedtime., Disp: 90 tablet, Rfl: 3 .  ezetimibe (ZETIA) 10 MG tablet, Take 1 tablet (10 mg total) by mouth daily., Disp: 90 tablet, Rfl: 3 .  gabapentin (NEURONTIN) 100 MG capsule, Take 2 capsules (200 mg total) by mouth 3 (three) times daily., Disp: 540 capsule, Rfl: 3 .  hydrochlorothiazide (HYDRODIURIL) 25 MG tablet, Take 1 tablet (25 mg total) by mouth daily., Disp: 90 tablet, Rfl: 3 .  hyoscyamine (LEVBID) 0.375 MG 12 hr tablet, Take 1 tablet (0.375 mg total) by mouth 2 (two) times daily as needed for cramping. Reported on 05/03/2016, Disp: 180 tablet, Rfl: 3 .  losartan (COZAAR) 50 MG tablet, Take 1 tablet (50 mg total) by mouth at bedtime., Disp: 90 tablet, Rfl: 3 .  meclizine (ANTIVERT) 25 MG tablet, Take 1 tablet (25 mg total) by mouth 2 (two) times daily as needed for dizziness., Disp: 180 tablet, Rfl: 3 .  mirtazapine (REMERON) 30 MG tablet, Take 1 tablet (30 mg total) by  mouth at bedtime., Disp: 90 tablet, Rfl: 3 .  ondansetron (ZOFRAN) 4 MG tablet, Take 1 tablet (4 mg total) by mouth every 8 (eight) hours as needed for nausea or vomiting., Disp: 20 tablet, Rfl: 0 .  OXYGEN, Inhale into the lungs daily. Uses at night with CPAP and PRN during daytime, Disp: , Rfl:  .  pantoprazole (PROTONIX) 40 MG tablet, Take 1 tablet (40 mg total) by mouth 2 (two) times daily., Disp: 180 tablet, Rfl: 3 .  traMADol (ULTRAM) 50 MG tablet, Take 1 tablet (50 mg total) every 8 (eight) hours as needed by mouth., Disp: 90 tablet, Rfl: 0 .  traZODone (DESYREL) 150 MG tablet, Take 1 tablet (150 mg total) by mouth at bedtime., Disp: 90 tablet, Rfl: 3  Review of  Systems  Constitutional: Positive for unexpected weight change.  Eyes: Negative.   Respiratory: Negative.   Cardiovascular: Negative.   Gastrointestinal: Negative.   Psychiatric/Behavioral: Positive for sleep disturbance. The patient is nervous/anxious.     Social History   Tobacco Use  . Smoking status: Current Every Day Smoker    Packs/day: 1.00    Years: 55.00    Pack years: 55.00    Types: Cigarettes  . Smokeless tobacco: Never Used  Substance Use Topics  . Alcohol use: Not Currently    Alcohol/week: 0.0 standard drinks      Objective:   There were no vitals taken for this visit. There were no vitals filed for this visit.   Physical Exam Constitutional:      Appearance: Normal appearance.  HENT:     Head: Normocephalic and atraumatic.     Right Ear: External ear normal.     Left Ear: External ear normal.  Eyes:     General: No scleral icterus. Pulmonary:     Breath sounds: Normal breath sounds.  Neurological:     Mental Status: She is alert and oriented to person, place, and time.  Psychiatric:        Mood and Affect: Mood normal.        Behavior: Behavior normal.        Thought Content: Thought content normal.        Judgment: Judgment normal.         Assessment & Plan    1. Anxiety Slightly worse. Increase Citalopram from 10 to 20mg  daily.  2. Major depressive disorder with single episode, in partial remission (HCC) Increase Remeron from 30 to 45 mg daily.  3. Primary insomnia   4. Tobacco abuse Pt advised to quit.  5. Esophagitis, reflux   6. Essential (primary) hypertension    Follow Up Instructions: RTC 1 month.   I discussed the assessment and treatment plan with the patient. The patient was provided an opportunity to ask questions and all were answered. The patient agreed with the plan and demonstrated an understanding of the instructions.   The patient was advised to call back or seek an in-person evaluation if the symptoms  worsen or if the condition fails to improve as anticipated.  I provided 12 minutes of non-face-to-face time during this encounter.       Richard Cranford Mon, MD  [facility]

## 2019-04-13 DIAGNOSIS — J449 Chronic obstructive pulmonary disease, unspecified: Secondary | ICD-10-CM | POA: Diagnosis not present

## 2019-04-23 ENCOUNTER — Other Ambulatory Visit: Payer: Self-pay

## 2019-04-23 ENCOUNTER — Ambulatory Visit (INDEPENDENT_AMBULATORY_CARE_PROVIDER_SITE_OTHER): Payer: Medicare Other

## 2019-04-23 DIAGNOSIS — Z0000 Encounter for general adult medical examination without abnormal findings: Secondary | ICD-10-CM | POA: Diagnosis not present

## 2019-04-23 NOTE — Progress Notes (Signed)
Subjective:   Crystal White is a 78 y.o. female who presents for Medicare Annual (Subsequent) preventive examination.    This visit is being conducted through telemedicine due to the COVID-19 pandemic. This patient has given me verbal consent via doximity to conduct this visit, patient states they are participating from their home address. Some vital signs may be absent or patient reported.    Patient identification: identified by name, DOB, and current address  Review of Systems:  N/A  Cardiac Risk Factors include: advanced age (>11men, >36 women);smoking/ tobacco exposure;hypertension     Objective:     Vitals: There were no vitals taken for this visit.  There is no height or weight on file to calculate BMI. Unable to obtain vitals due to visit being conducted via telephonically.   Advanced Directives 04/23/2019 01/24/2019 04/17/2018 07/05/2017 03/28/2017 05/26/2016 05/03/2016  Does Patient Have a Medical Advance Directive? Yes Yes Yes Yes Yes No Yes  Type of Paramedic of [place];Living will Living will [place];Living will - [place];Living will - [place];Living will  Does patient want to make changes to medical advance directive? - - - - - - No - Patient declined  Copy of [place] in Chart? No - copy requested - No - copy requested - No - copy requested - No - copy requested  Would patient like information on creating a medical advance directive? - - - - - - -    Tobacco Social History   Tobacco Use  Smoking Status Current Every Day Smoker   Packs/day: 1.00   Years: 55.00   Pack years: 55.00   Types: Cigarettes  Smokeless Tobacco Never Used     Ready to quit: No Counseling given: No   Clinical Intake:  Pre-visit preparation completed: Yes  Pain : No/denies pain Pain Score: 0-No pain     Nutritional Status: BMI of 19-24  Normal Nutritional Risks:  None Diabetes: No  How often do you need to have someone help you when you read instructions, pamphlets, or other written materials from your doctor or pharmacy?: 1 - Never  Interpreter Needed?: No  Information entered by :: Mmarkoski, LPN  Past Medical History:  Diagnosis Date   Arthritis    hands, lower back   Barrett's esophagus    COPD (chronic obstructive pulmonary disease) ([place])    Emphysema lung ([place])    GERD (gastroesophageal reflux disease)    Hernia of abdominal cavity    Hyperlipidemia    Hypertension    Mobitz type II atrioventricular block    OSA (obstructive sleep apnea)    on CPAP   Peripheral neuropathy    Presence of permanent cardiac pacemaker 09/22/11   Biotronik - EVIADR-T [facility])   Shortness of breath dyspnea    Stomach ulcer    Past Surgical History:  Procedure Laterality Date   APPENDECTOMY  2006   Dr. Gae Dry LIFT Bilateral 05/03/2016   Procedure: BLEPHAROPLASTY  BILATERAL UPPER EYELIDS WITH EXCESS SKIN REMOVAL BILATERAL BROW PTOSIS REPAIR [place] ;  Surgeon: Karle Starch, MD;  Location: [place];  Service: Ophthalmology;  Laterality: Bilateral;   CARDIAC CATHETERIZATION  2007   COLON SURGERY     COLONOSCOPY WITH PROPOFOL N/A 04/14/2015   Procedure: COLONOSCOPY WITH PROPOFOL;  Surgeon: Lollie Sails, MD;  Location[facility] ENDOSCOPY;  Service: Endoscopy;  Laterality: N/A;   COLONOSCOPY WITH PROPOFOL  N/A 01/24/2019   Procedure: COLONOSCOPY WITH PROPOFOL;  Surgeon: Lollie Sails, MD;  Location: [facility] ENDOSCOPY;  Service: Endoscopy;  Laterality: N/A;   ESOPHAGOGASTRODUODENOSCOPY N/A 04/14/2015   Procedure: ESOPHAGOGASTRODUODENOSCOPY (EGD);  Surgeon: Lollie Sails, MD;  Location: [facility] ENDOSCOPY;  Service: Endoscopy;  Laterality: N/A;   ESOPHAGOGASTRODUODENOSCOPY (EGD) WITH PROPOFOL N/A 07/05/2017   Procedure: ESOPHAGOGASTRODUODENOSCOPY (EGD) WITH PROPOFOL;  Surgeon: Manya Silvas, MD;  [facility] ENDOSCOPY;  Service: Endoscopy;  Laterality: N/A;   ESOPHAGOGASTRODUODENOSCOPY (EGD) WITH PROPOFOL N/A 01/24/2019   Procedure: ESOPHAGOGASTRODUODENOSCOPY (EGD) WITH PROPOFOL;  Surgeon: Lollie Sails, MD;  Location: [facility] ENDOSCOPY;  Service: Endoscopy;  Laterality: N/A;   HIATAL HERNIA REPAIR  2007   LAPAROSCOPIC RIGHT HEMI COLECTOMY Right 06/22/2015   Procedure: LAPAROSCOPIC RIGHT HEMI COLECTOMY;  Surgeon: Marlyce Huge, MD;  Location: [facility] ORS;  Service: General;  Laterality: Right;   NISSEN FUNDOPLICATION  4098   PACEMAKER INSERTION  09/22/11   [facility]  - Biotronik EVIADR-T   TOTAL ABDOMINAL HYSTERECTOMY  1980   Family History  Adopted: Yes  Problem Relation Age of Onset   Diabetes Son    Social History   Socioeconomic History   Marital status: Widowed    Spouse name: widowed   Number of children: 2   Years of education: Not on file   Highest education level: Some college, no degree  Occupational History   Occupation: retired Medical sales representative strain: Not hard at [employer] insecurity:    Worry: Never true    Inability: Never true   Transportation needs:    Medical: No    Non-medical: No  Tobacco Use   Smoking status: Current Every Day Smoker    Packs/day: 1.00    Years: 55.00    Pack years: 55.00    Types: Cigarettes   Smokeless tobacco: Never Used  Substance and Sexual Activity   Alcohol use: Yes    Alcohol/week: 0.0 standard drinks    Comment: 1/2 glass wine -rare   Drug use: No   Sexual activity: Never  Lifestyle   Physical activity:    Days per week: 0 days    Minutes per session: 0 min   Stress: Not at all  Relationships   Social connections:    Talks on phone: Patient refused    Gets together: Patient refused    Attends religious service: Patient refused    Active member of club or organization: Patient refused    Attends meetings of clubs or organizations: Patient  refused    Relationship status: Patient refused  Other Topics Concern   Not on file  Social History Narrative   Pt was adopted.    Lives at home by herself.   Not on home oxygen.    Outpatient Encounter Medications as of 04/23/2019  Medication Sig   acetaminophen (TYLENOL) 500 MG tablet Take 500 mg by mouth every 6 (six) hours as needed.   albuterol (PROVENTIL HFA) 108 (90 Base) MCG/ACT inhaler Inhale 2 puffs into the lungs every 6 (six) hours as needed for wheezing or shortness of breath.   BREO ELLIPTA 100-25 MCG/INH AEPB USE 1 INHALATION DAILY   citalopram (CELEXA) 20 MG tablet Take 1 tablet (20 mg total) by mouth daily.   ezetimibe (ZETIA) 10 MG tablet Take 1 tablet (10 mg total) by mouth daily.   gabapentin (NEURONTIN) 100 MG capsule Take 2 capsules (200 mg total) by mouth 3 (three) times daily.  hydrochlorothiazide (HYDRODIURIL) 25 MG tablet Take 1 tablet (25 mg total) by mouth daily.   hyoscyamine (LEVBID) 0.375 MG 12 hr tablet Take 1 tablet (0.375 mg total) by mouth 2 (two) times daily as needed for cramping. Reported on 05/03/2016   losartan (COZAAR) 50 MG tablet Take 1 tablet (50 mg total) by mouth at bedtime.   meclizine (ANTIVERT) 25 MG tablet Take 1 tablet (25 mg total) by mouth 2 (two) times daily as needed for dizziness.   mirtazapine (REMERON) 45 MG tablet Take 1 tablet (45 mg total) by mouth at bedtime.   ondansetron (ZOFRAN) 4 MG tablet Take 1 tablet (4 mg total) by mouth every 8 (eight) hours as needed for nausea or vomiting.   OXYGEN Inhale into the lungs daily. Uses at night with CPAP and PRN during daytime   pantoprazole (PROTONIX) 40 MG tablet Take 1 tablet (40 mg total) by mouth 2 (two) times daily.   traMADol (ULTRAM) 50 MG tablet Take 1 tablet (50 mg total) every 8 (eight) hours as needed by mouth.   traZODone (DESYREL) 150 MG tablet Take 1 tablet (150 mg total) by mouth at bedtime.   alendronate (FOSAMAX) 70 MG tablet Take 1 tablet (70 mg total)  by mouth once a week. (Patient not taking: Reported on 04/23/2019)   ALPRAZolam (XANAX) 0.25 MG tablet Take 1 tablet (0.25 mg total) by mouth 2 (two) times daily. (Patient not taking: Reported on 04/23/2019)   No facility-administered encounter medications on file as of 04/23/2019.     Activities of Daily Living In your present state of health, do you have any difficulty performing the following activities: 04/23/2019  Hearing? N  Vision? N  Difficulty concentrating or making decisions? N  Walking or climbing stairs? N  Dressing or bathing? N  Doing errands, shopping? Y  Comment Does not drive by choice.   Preparing Food and eating ? N  Using the Toilet? N  In the past six months, have you accidently leaked urine? Y  Comment Wears protection at all times.   Do you have problems with loss of bowel control? N  Managing your Medications? N  Managing your Finances? N  Housekeeping or managing your Housekeeping? N  Some recent data might be hidden    Patient Care Team: Jerrol Banana., MD as PCP - General (Unknown Physician Specialty) Laverle Hobby, MD as Consulting Physician (Pulmonary Disease) Samara Deist, DPM as Referring Physician (Podiatry) Lollie Sails, MD as Consulting Physician (Gastroenterology) Isaias Cowman, MD as Consulting Physician (Cardiology)    Assessment:   This is a routine wellness examination for Carmelina.  Exercise Activities and Dietary recommendations Current Exercise Habits: The patient does not participate in regular exercise at present, Exercise limited by: None identified  Goals     Cut out night time snacks     Recommend cutting out all snacks after 7 PM at night.      DIET - REDUCE FAT INTAKE     Recommend cutting fat intake in half to help aid in weight loss.        Fall Risk: Fall Risk  04/23/2019 10/19/2018 04/17/2018 03/28/2017 02/22/2016  Falls in the past year? 1 0 No No No  Number falls in past yr: 0 - - - -   Injury with Fall? 0 - - - -  Follow up Falls prevention discussed - - - -    FALL RISK PREVENTION PERTAINING TO THE HOME:  Any stairs in or around the  home? Yes  If so, are there any without handrails? Yes   Home free of loose throw rugs in walkways, pet beds, electrical cords, etc? Yes  Adequate lighting in your home to reduce risk of falls? Yes   ASSISTIVE DEVICES UTILIZED TO PREVENT FALLS:  Life alert? Yes  Use of a cane, walker or w/c? No  Grab bars in the bathroom? Yes  Shower chair or bench in shower? Yes  Elevated toilet seat or a handicapped toilet? Yes    TIMED UP AND GO:  Was the test performed? No .    Depression Screen PHQ 2/9 Scores 04/23/2019 10/19/2018 04/17/2018 04/17/2018  PHQ - 2 Score 0 0 0 0  PHQ- 9 Score - - 9 -     Cognitive Function: Declined today.      6CIT Screen 04/17/2018 03/28/2017  What Year? 0 points 0 points  What month? 0 points 0 points  What time? 0 points 0 points  Count back from 20 0 points 0 points  Months in reverse 2 points 2 points  Repeat phrase 0 points 0 points  Total Score 2 2    Immunization History  Administered Date(s) Administered   Influenza Split 09/20/2006, 09/02/2010, 09/10/2012   Influenza Whole 08/28/2012   Influenza, High Dose Seasonal PF 08/28/2014, 08/10/2015, 07/25/2016, 10/03/2017, 08/30/2018   Influenza-Unspecified 08/28/2013   Pneumococcal Conjugate-13 08/28/2014   Pneumococcal Polysaccharide-23 03/07/2011   Tdap 08/28/2014   Zoster 11/04/2012    Qualifies for Shingles Vaccine? Yes  Zostavax completed 11/04/12. Due for Shingrix. Education has been provided regarding the importance of this vaccine. Pt has been advised to call insurance company to determine out of pocket expense. Advised may also receive vaccine at local pharmacy or Health Dept. Verbalized acceptance and understanding.  Tdap: Up to date  Flu Vaccine: Up to date  Pneumococcal Vaccine: Up to date  Screening Tests Health  Maintenance  Topic Date Due   DEXA SCAN  03/10/2018   INFLUENZA VACCINE  06/29/2019   COLONOSCOPY  01/24/2022   TETANUS/TDAP  08/28/2024   PNA vac Low Risk Adult  Completed    Cancer Screenings:  Colorectal Screening: Completed 01/24/19. Repeat every 3 years.  Mammogram: No longer required.   Bone Density: Completed 03/10/16. Results reflect OSTEOPOROSIS. Repeat every 2 years. Pt declined referral today.   Lung Cancer Screening: (Low Dose CT Chest recommended if Age 35-80 years, 30 pack-year currently smoking OR have quit w/in 15years.) does not qualify.   Additional Screening:  Vision Screening: Recommended annual ophthalmology exams for early detection of glaucoma and other disorders of the eye.  Dental Screening: Recommended annual dental exams for proper oral hygiene  Community Resource Referral:  CRR required this visit?  No       Plan:  I have personally reviewed and addressed the Medicare Annual Wellness questionnaire and have noted the following in the patients chart:  A. Medical and social history B. Use of alcohol, tobacco or illicit drugs  C. Current medications and supplements D. Functional ability and status E.  Nutritional status F.  Physical activity G. Advance directives H. List of other physicians I.  Hospitalizations, surgeries, and ER visits in previous 12 months J.  [employer] such as hearing and vision if needed, cognitive and depression L. Referrals and appointments   In addition, I have reviewed and discussed with patient certain preventive protocols, quality metrics, and best practice recommendations. A written personalized care plan for preventive services as well as general preventive health  recommendations were provided to patient. Nurse Health Advisor  Signed,    Sharry Beining [place], [place]  05/13/736 Nurse Health Advisor   Nurse Notes: Pt declined a DEXA referral today.

## 2019-04-23 NOTE — Patient Instructions (Signed)
[name] , Thank you for taking time to come for your Medicare Wellness Visit. I appreciate your ongoing commitment to your health goals. Please review the following plan we discussed and let me know if I can assist you in the future.   Screening recommendations/referrals: Colonoscopy: Up to date, due 12/2021 Mammogram: No longer required.  Bone Density: Pt declined order today.  Recommended yearly ophthalmology/optometry visit for glaucoma screening and checkup Recommended yearly dental visit for hygiene and checkup  Vaccinations: Influenza vaccine: Up to date Pneumococcal vaccine: Completed series Tdap vaccine: Up to date, due 08/2024 Shingles vaccine: Pt declines today.     Advanced directives: Please bring a copy of your POA (Power of Attorney) and/or Living Will to your next appointment.   Conditions/risks identified: Continue to try and cut out any snacks or food after 7 PM.   Next appointment: 05/08/19 @ 2:00 PM with Dr Rosanna Randy. Pt declined scheduling an AWV for 2021 at this time.    Preventive Care 77 Years and Older, Female Preventive care refers to lifestyle choices and visits with your health care provider that can promote health and wellness. What does preventive care include?  A yearly physical exam. This is also called an annual well check.  Dental exams once or twice a year.  Routine eye exams. Ask your health care provider how often you should have your eyes checked.  Personal lifestyle choices, including:  Daily care of your teeth and gums.  Regular physical activity.  Eating a healthy diet.  Avoiding tobacco and drug use.  Limiting alcohol use.  Practicing safe sex.  Taking low-dose aspirin every day.  Taking vitamin and mineral supplements as recommended by your health care provider. What happens during an annual well check? The services and screenings done by your health care provider during your annual well check will depend on your age,  overall health, lifestyle risk factors, and family history of disease. Counseling  Your health care provider may ask you questions about your:  Alcohol use.  Tobacco use.  Drug use.  Emotional well-being.  Home and relationship well-being.  Sexual activity.  Eating habits.  History of falls.  Memory and ability to understand (cognition).  Work and work Statistician.  Reproductive health. Screening  You may have the following tests or measurements:  Height, weight, and BMI.  Blood pressure.  Lipid and cholesterol levels. These may be checked every 5 years, or more frequently if you are over 22 years old.  Skin check.  Lung cancer screening. You may have this screening every year starting at age 77 if you have a 30-pack-year history of smoking and currently smoke or have quit within the past 15 years.  Fecal occult blood test (FOBT) of the stool. You may have this test every year starting at age 77.  Flexible sigmoidoscopy or colonoscopy. You may have a sigmoidoscopy every 5 years or a colonoscopy every 10 years starting at age 77.  Hepatitis C blood test.  Hepatitis B blood test.  Sexually transmitted disease (STD) testing.  Diabetes screening. This is done by checking your blood sugar (glucose) after you have not eaten for a while (fasting). You may have this done every 1-3 years.  Bone density scan. This is done to screen for osteoporosis. You may have this done starting at age 65.  Mammogram. This may be done every 1-2 years. Talk to your health care provider about how often you should have regular mammograms. Talk with your health care provider about  your test results, treatment options, and if necessary, the need for more tests. Vaccines  Your health care provider may recommend certain vaccines, such as:  Influenza vaccine. This is recommended every year.  Tetanus, diphtheria, and acellular pertussis (Tdap, Td) vaccine. You may need a Td booster every 10  years.  Zoster vaccine. You may need this after age 77.  Pneumococcal 13-valent conjugate (PCV13) vaccine. One dose is recommended after age 77.  Pneumococcal polysaccharide (PPSV23) vaccine. One dose is recommended after age 77. Talk to your health care provider about which screenings and vaccines you need and how often you need them. This information is not intended to replace advice given to you by your health care provider. Make sure you discuss any questions you have with your health care provider. Document Released: 12/11/2015 Document Revised: 08/03/2016 Document Reviewed: 09/15/2015 Elsevier Interactive Patient Education  2017 [place] Prevention in the Home Falls can cause injuries. They can happen to people of all ages. There are many things you can do to make your home safe and to help prevent falls. What can I do on the outside of my home?  Regularly fix the edges of walkways and driveways and fix any cracks.  Remove anything that might make you trip as you walk through a door, such as a raised step or threshold.  Trim any bushes or trees on the path to your home.  Use bright outdoor lighting.  Clear any walking paths of anything that might make someone trip, such as rocks or tools.  Regularly check to see if handrails are loose or broken. Make sure that both sides of any steps have handrails.  Any raised decks and porches should have guardrails on the edges.  Have any leaves, snow, or ice cleared regularly.  Use sand or salt on walking paths during winter.  Clean up any spills in your garage right away. This includes oil or grease spills. What can I do in the bathroom?  Use night lights.  Install grab bars by the toilet and in the tub and shower. Do not use towel bars as grab bars.  Use non-skid mats or decals in the tub or shower.  If you need to sit down in the shower, use a plastic, non-slip stool.  Keep the floor dry. Clean up any water that  spills on the floor as soon as it happens.  Remove soap buildup in the tub or shower regularly.  Attach bath mats securely with double-sided non-slip rug tape.  Do not have throw rugs and other things on the floor that can make you trip. What can I do in the bedroom?  Use night lights.  Make sure that you have a light by your bed that is easy to reach.  Do not use any sheets or blankets that are too big for your bed. They should not hang down onto the floor.  Have a firm chair that has side arms. You can use this for support while you get dressed.  Do not have throw rugs and other things on the floor that can make you trip. What can I do in the kitchen?  Clean up any spills right away.  Avoid walking on wet floors.  Keep items that you use a lot in easy-to-reach places.  If you need to reach something above you, use a strong step stool that has a grab bar.  Keep electrical cords out of the way.  Do not use floor polish or wax  that makes floors slippery. If you must use wax, use non-skid floor wax.  Do not have throw rugs and other things on the floor that can make you trip. What can I do with my stairs?  Do not leave any items on the stairs.  Make sure that there are handrails on both sides of the stairs and use them. Fix handrails that are broken or loose. Make sure that handrails are as long as the stairways.  Check any carpeting to make sure that it is firmly attached to the stairs. Fix any carpet that is loose or worn.  Avoid having throw rugs at the top or bottom of the stairs. If you do have throw rugs, attach them to the floor with carpet tape.  Make sure that you have a light switch at the top of the stairs and the bottom of the stairs. If you do not have them, ask someone to add them for you. What else can I do to help prevent falls?  Wear shoes that:  Do not have high heels.  Have rubber bottoms.  Are comfortable and fit you well.  Are closed at the  toe. Do not wear sandals.  If you use a stepladder:  Make sure that it is fully opened. Do not climb a closed stepladder.  Make sure that both sides of the stepladder are locked into place.  Ask someone to hold it for you, if possible.  Clearly mark and make sure that you can see:  Any grab bars or handrails.  First and last steps.  Where the edge of each step is.  Use tools that help you move around (mobility aids) if they are needed. These include:  Canes.  Walkers.  Scooters.  Crutches.  Turn on the lights when you go into a dark area. Replace any light bulbs as soon as they burn out.  Set up your furniture so you have a clear path. Avoid moving your furniture around.  If any of your floors are uneven, fix them.  If there are any pets around you, be aware of where they are.  Review your medicines with your doctor. Some medicines can make you feel dizzy. This can increase your chance of falling. Ask your doctor what other things that you can do to help prevent falls. This information is not intended to replace advice given to you by your health care provider. Make sure you discuss any questions you have with your health care provider. Document Released: 09/10/2009 Document Revised: 04/21/2016 Document Reviewed: 12/19/2014 Elsevier Interactive Patient Education  2017 [employer].

## 2019-05-08 ENCOUNTER — Encounter: Payer: Self-pay | Admitting: Family Medicine

## 2019-05-09 ENCOUNTER — Other Ambulatory Visit: Payer: Self-pay | Admitting: Family Medicine

## 2019-05-14 DIAGNOSIS — J449 Chronic obstructive pulmonary disease, unspecified: Secondary | ICD-10-CM | POA: Diagnosis not present

## 2019-06-03 ENCOUNTER — Telehealth: Payer: Self-pay

## 2019-06-03 NOTE — Telephone Encounter (Signed)
Appt was scheduled with Simona Huh tomorrow 06/05/2019.

## 2019-06-03 NOTE — Telephone Encounter (Signed)
Patient calling that she has UTI because is painful but she also reports that she has dark stool. No more appointments available for today.  Please Review.  CB# [phone]

## 2019-06-04 ENCOUNTER — Ambulatory Visit: Payer: Self-pay | Admitting: Family Medicine

## 2019-06-05 DIAGNOSIS — I442 Atrioventricular block, complete: Secondary | ICD-10-CM | POA: Diagnosis not present

## 2019-06-07 ENCOUNTER — Other Ambulatory Visit
Admission: RE | Admit: 2019-06-07 | Discharge: 2019-06-07 | Disposition: A | Discharge status: Home / Self Care | Payer: Medicare Other | Source: Ambulatory Visit | Attending: Gastroenterology | Admitting: Gastroenterology

## 2019-06-07 ENCOUNTER — Other Ambulatory Visit: Payer: Self-pay

## 2019-06-07 DIAGNOSIS — Z01812 Encounter for preprocedural laboratory examination: Secondary | ICD-10-CM | POA: Insufficient documentation

## 2019-06-07 DIAGNOSIS — Z1159 Encounter for screening for other viral diseases: Secondary | ICD-10-CM | POA: Insufficient documentation

## 2019-06-08 LAB — SARS CORONAVIRUS 2 (TAT 6-24 HRS): SARS Coronavirus 2: NEGATIVE

## 2019-06-11 ENCOUNTER — Ambulatory Visit: Payer: Medicare Other | Admitting: Anesthesiology

## 2019-06-11 ENCOUNTER — Encounter
Admission: RE | Disposition: A | Discharge status: Home / Self Care | Payer: Self-pay | Source: Home / Self Care | Attending: Gastroenterology

## 2019-06-11 ENCOUNTER — Other Ambulatory Visit: Payer: Self-pay

## 2019-06-11 ENCOUNTER — Encounter: Payer: Self-pay | Admitting: Anesthesiology

## 2019-06-11 ENCOUNTER — Ambulatory Visit
Admission: RE | Admit: 2019-06-11 | Discharge: 2019-06-11 | Disposition: A | Discharge status: Home / Self Care | Payer: Medicare Other | Attending: Gastroenterology | Admitting: Gastroenterology

## 2019-06-11 DIAGNOSIS — D125 Benign neoplasm of sigmoid colon: Secondary | ICD-10-CM | POA: Diagnosis not present

## 2019-06-11 DIAGNOSIS — I10 Essential (primary) hypertension: Secondary | ICD-10-CM | POA: Diagnosis not present

## 2019-06-11 DIAGNOSIS — I441 Atrioventricular block, second degree: Secondary | ICD-10-CM | POA: Insufficient documentation

## 2019-06-11 DIAGNOSIS — Z8603 Personal history of neoplasm of uncertain behavior: Secondary | ICD-10-CM | POA: Insufficient documentation

## 2019-06-11 DIAGNOSIS — D120 Benign neoplasm of cecum: Secondary | ICD-10-CM | POA: Insufficient documentation

## 2019-06-11 DIAGNOSIS — K6289 Other specified diseases of anus and rectum: Secondary | ICD-10-CM | POA: Insufficient documentation

## 2019-06-11 DIAGNOSIS — Z7951 Long term (current) use of inhaled steroids: Secondary | ICD-10-CM | POA: Insufficient documentation

## 2019-06-11 DIAGNOSIS — M19041 Primary osteoarthritis, right hand: Secondary | ICD-10-CM | POA: Insufficient documentation

## 2019-06-11 DIAGNOSIS — M19042 Primary osteoarthritis, left hand: Secondary | ICD-10-CM | POA: Insufficient documentation

## 2019-06-11 DIAGNOSIS — Z79899 Other long term (current) drug therapy: Secondary | ICD-10-CM | POA: Diagnosis not present

## 2019-06-11 DIAGNOSIS — G629 Polyneuropathy, unspecified: Secondary | ICD-10-CM | POA: Diagnosis not present

## 2019-06-11 DIAGNOSIS — M4696 Unspecified inflammatory spondylopathy, lumbar region: Secondary | ICD-10-CM | POA: Insufficient documentation

## 2019-06-11 DIAGNOSIS — E785 Hyperlipidemia, unspecified: Secondary | ICD-10-CM | POA: Insufficient documentation

## 2019-06-11 DIAGNOSIS — K640 First degree hemorrhoids: Secondary | ICD-10-CM | POA: Insufficient documentation

## 2019-06-11 DIAGNOSIS — K219 Gastro-esophageal reflux disease without esophagitis: Secondary | ICD-10-CM | POA: Insufficient documentation

## 2019-06-11 DIAGNOSIS — J439 Emphysema, unspecified: Secondary | ICD-10-CM | POA: Insufficient documentation

## 2019-06-11 DIAGNOSIS — F17210 Nicotine dependence, cigarettes, uncomplicated: Secondary | ICD-10-CM | POA: Insufficient documentation

## 2019-06-11 DIAGNOSIS — K5790 Diverticulosis of intestine, part unspecified, without perforation or abscess without bleeding: Secondary | ICD-10-CM | POA: Diagnosis not present

## 2019-06-11 DIAGNOSIS — K2270 Barrett's esophagus without dysplasia: Secondary | ICD-10-CM | POA: Diagnosis not present

## 2019-06-11 DIAGNOSIS — Z950 Presence of cardiac pacemaker: Secondary | ICD-10-CM | POA: Diagnosis not present

## 2019-06-11 DIAGNOSIS — K633 Ulcer of intestine: Secondary | ICD-10-CM | POA: Diagnosis not present

## 2019-06-11 DIAGNOSIS — K6389 Other specified diseases of intestine: Secondary | ICD-10-CM | POA: Diagnosis not present

## 2019-06-11 DIAGNOSIS — K5730 Diverticulosis of large intestine without perforation or abscess without bleeding: Secondary | ICD-10-CM | POA: Diagnosis not present

## 2019-06-11 DIAGNOSIS — Z980 Intestinal bypass and anastomosis status: Secondary | ICD-10-CM | POA: Diagnosis not present

## 2019-06-11 DIAGNOSIS — Z888 Allergy status to other drugs, medicaments and biological substances status: Secondary | ICD-10-CM | POA: Insufficient documentation

## 2019-06-11 DIAGNOSIS — D126 Benign neoplasm of colon, unspecified: Secondary | ICD-10-CM | POA: Insufficient documentation

## 2019-06-11 DIAGNOSIS — K635 Polyp of colon: Secondary | ICD-10-CM | POA: Diagnosis not present

## 2019-06-11 HISTORY — PX: COLONOSCOPY WITH PROPOFOL: SHX5780

## 2019-06-11 SURGERY — COLONOSCOPY WITH PROPOFOL
Anesthesia: General

## 2019-06-11 MED ORDER — SODIUM CHLORIDE 0.9 % IV SOLN
INTRAVENOUS | Status: DC
  Administered 2019-06-11: 10:00:00 1000 mL via INTRAVENOUS

## 2019-06-11 MED ORDER — PROPOFOL 500 MG/50ML IV EMUL
INTRAVENOUS | Status: DC | PRN
  Administered 2019-06-11: 100 ug/kg/min via INTRAVENOUS

## 2019-06-11 MED ORDER — PHENYLEPHRINE HCL (PRESSORS) 10 MG/ML IV SOLN
INTRAVENOUS | Status: AC
  Filled 2019-06-11: qty 1

## 2019-06-11 MED ORDER — MIDAZOLAM HCL 2 MG/2ML IJ SOLN
INTRAMUSCULAR | Status: AC
  Filled 2019-06-11: qty 2

## 2019-06-11 MED ORDER — MIDAZOLAM HCL 2 MG/2ML IJ SOLN
INTRAMUSCULAR | Status: DC | PRN
  Administered 2019-06-11 (×2): 1 mg via INTRAVENOUS

## 2019-06-11 MED ORDER — FENTANYL CITRATE (PF) 100 MCG/2ML IJ SOLN
INTRAMUSCULAR | Status: DC | PRN
  Administered 2019-06-11 (×2): 50 ug via INTRAVENOUS

## 2019-06-11 MED ORDER — FENTANYL CITRATE (PF) 100 MCG/2ML IJ SOLN
INTRAMUSCULAR | Status: AC
  Filled 2019-06-11: qty 2

## 2019-06-11 MED ORDER — PROPOFOL 500 MG/50ML IV EMUL
INTRAVENOUS | Status: AC
  Filled 2019-06-11: qty 50

## 2019-06-11 MED ORDER — PHENYLEPHRINE HCL (PRESSORS) 10 MG/ML IV SOLN
INTRAVENOUS | Status: DC | PRN
  Administered 2019-06-11 (×3): 50 ug via INTRAVENOUS

## 2019-06-11 NOTE — Anesthesia Procedure Notes (Signed)
Performed by: Cook-Martin, Zyrah Wiswell Pre-anesthesia Checklist: Patient identified, Emergency Drugs available, Suction available, Timeout performed and Patient being monitored Patient Re-evaluated:Patient Re-evaluated prior to induction Oxygen Delivery Method: Nasal cannula Preoxygenation: Pre-oxygenation with 100% oxygen Induction Type: IV induction Placement Confirmation: positive ETCO2 and CO2 detector       

## 2019-06-11 NOTE — H&P (Signed)
Outpatient short stay form Pre-procedure 06/11/2019 10:20 AM Crystal Sails MD  Primary Physician: Dr Dion Body  Reason for visit: Colonoscopy  History of present illness: Patient is a 78 year old female presenting today for colonoscopy in regards to a finding of a colonic ulceration on a colonoscopy January 24, 2019.  Biopsies of that lesion were benign however there was some mild glandular low-grade dysplasia.  Following that procedure she has been placed on a 5-ASA product and is returning today for a recheck of her colonoscopy.  She does have a history of a right hemicolectomy.  She states that her stools have become better with less diarrhea starting the 5-ASA.  He sees no blood in the stool she has no abdominal pain.  She takes no aspirin product or blood thinner.    Current Facility-Administered Medications:  .  0.9 %  sodium chloride infusion, , Intravenous, Continuous, Crystal Sails, MD, Last Rate: 20 mL/hr at 06/11/19 1020, 1,000 mL at 06/11/19 1020  Medications Prior to Admission  Medication Sig Dispense Refill Last Dose  . acetaminophen (TYLENOL) 500 MG tablet Take 500 mg by mouth every 6 (six) hours as needed.   06/10/2019 at Unknown time  . BREO ELLIPTA 100-25 MCG/INH AEPB USE 1 INHALATION DAILY 180 each 3 06/10/2019 at Unknown time  . citalopram (CELEXA) 10 MG tablet TAKE 1 TABLET BY MOUTH AT  BEDTIME 90 tablet 3 06/10/2019 at Unknown time  . ezetimibe (ZETIA) 10 MG tablet TAKE 1 TABLET BY MOUTH  DAILY 90 tablet 3 06/10/2019 at Unknown time  . gabapentin (NEURONTIN) 100 MG capsule TAKE 2 CAPSULES BY MOUTH 3  TIMES DAILY 540 capsule 3 06/10/2019 at Unknown time  . hydrochlorothiazide (HYDRODIURIL) 25 MG tablet TAKE 1 TABLET BY MOUTH  DAILY 90 tablet 3 06/10/2019 at Unknown time  . hyoscyamine (LEVBID) 0.375 MG 12 hr tablet Take 1 tablet (0.375 mg total) by mouth 2 (two) times daily as needed for cramping. Reported on 05/03/2016 180 tablet 3 06/10/2019 at Unknown time  .  losartan (COZAAR) 50 MG tablet TAKE 1 TABLET BY MOUTH AT  BEDTIME 90 tablet 3 06/10/2019 at Unknown time  . mirtazapine (REMERON) 30 MG tablet TAKE 1 TABLET BY MOUTH AT  BEDTIME 90 tablet 3 06/10/2019 at Unknown time  . ondansetron (ZOFRAN) 4 MG tablet Take 1 tablet (4 mg total) by mouth every 8 (eight) hours as needed for nausea or vomiting. 20 tablet 0 Past Week at Unknown time  . OXYGEN Inhale into the lungs daily. Uses at night with CPAP and PRN during daytime   06/10/2019 at Unknown time  . pantoprazole (PROTONIX) 40 MG tablet TAKE 1 TABLET BY MOUTH TWO  TIMES DAILY 180 tablet 3 06/10/2019 at Unknown time  . traMADol (ULTRAM) 50 MG tablet Take 1 tablet (50 mg total) every 8 (eight) hours as needed by mouth. 90 tablet 0 06/10/2019 at Unknown time  . traZODone (DESYREL) 150 MG tablet TAKE 1 TABLET BY MOUTH AT  BEDTIME 90 tablet 3 06/10/2019 at Unknown time  . albuterol (PROVENTIL HFA) 108 (90 Base) MCG/ACT inhaler Inhale 2 puffs into the lungs every 6 (six) hours as needed for wheezing or shortness of breath. 3 each 3  at prn  . alendronate (FOSAMAX) 70 MG tablet Take 1 tablet (70 mg total) by mouth once a week. (Patient not taking: Reported on 04/23/2019) 12 tablet 3   . ALPRAZolam (XANAX) 0.25 MG tablet Take 1 tablet (0.25 mg total) by mouth 2 (two) times  daily. (Patient not taking: Reported on 04/23/2019) 60 tablet 0   . citalopram (CELEXA) 20 MG tablet Take 1 tablet (20 mg total) by mouth daily. 30 tablet 11   . meclizine (ANTIVERT) 25 MG tablet Take 1 tablet (25 mg total) by mouth 2 (two) times daily as needed for dizziness. 180 tablet 3   . mirtazapine (REMERON) 45 MG tablet Take 1 tablet (45 mg total) by mouth at bedtime. 30 tablet 11      Allergies  Allergen Reactions  . Iodinated Diagnostic Agents Other (See Comments)    Burning sensation during contrast media injections. Patient states the IV contrast makes her very hot. She has had multiple CT Scans with IV contrast and states she only gets  the warm sensation. This is not an allergic reaction, but is noted in her [facility] chart as well. Aggie Hacker, ARRT R CT, had this discussion with her on 01/10/19 at 1130am.  . Statins Other (See Comments)    Reaction:  Leg cramps   . Bacitracin Swelling and Rash     Past Medical History:  Diagnosis Date  . Arthritis    hands, lower back  . Barrett's esophagus   . COPD (chronic obstructive pulmonary disease) ([place])   . Emphysema lung ([place])   . GERD (gastroesophageal reflux disease)   . Hernia of abdominal cavity   . Hyperlipidemia   . Hypertension   . Mobitz type II atrioventricular block   . OSA (obstructive sleep apnea)    on CPAP  . Peripheral neuropathy   . Presence of permanent cardiac pacemaker 09/22/11   Biotronik - EVIADR-T [facility])  . Shortness of breath dyspnea   . Stomach ulcer     Review of systems:      Physical Exam    Heart and lungs: Regular rate and rhythm without rub or gallop lungs are bilaterally clear    HEENT: Normocephalic atraumatic eyes are anicteric    Other:    Pertinant exam for procedure: Soft nontender nondistended bowel sounds positive normoactive    Planned proceedures: Colonoscopy and indicated procedures. I have discussed the risks benefits and complications of procedures to include not limited to bleeding, infection, perforation and the risk of sedation and the patient wishes to proceed.    Crystal Sails, MD Gastroenterology 06/11/2019  10:20 AM

## 2019-06-11 NOTE — Transfer of Care (Signed)
Immediate Anesthesia Transfer of Care Note  Patient: Crystal White  Procedure(s) Performed: COLONOSCOPY WITH PROPOFOL (N/A )  Patient Location: PACU  Anesthesia Type:General  Level of Consciousness: awake and sedated  Airway & Oxygen Therapy: Patient Spontanous Breathing and Patient connected to nasal cannula oxygen  Post-op Assessment: Report given to RN and Post -op Vital signs reviewed and stable  Post vital signs: Reviewed and stable  Last Vitals:  Vitals Value Taken Time  BP    Temp    Pulse    Resp    SpO2      Last Pain:  Vitals:   06/11/19 0956  TempSrc: Tympanic  PainSc: 0-No pain         Complications: No apparent anesthesia complications

## 2019-06-11 NOTE — Anesthesia Post-op Follow-up Note (Signed)
Anesthesia QCDR form completed.        

## 2019-06-11 NOTE — Anesthesia Preprocedure Evaluation (Signed)
Anesthesia Evaluation  Patient identified by MRN, date of birth, ID band Patient awake    Reviewed: Allergy & Precautions, H&P , NPO status , Patient's Chart, lab work & pertinent test results, reviewed documented beta blocker date and time   Airway Mallampati: II   Neck ROM: full    Dental  (+) Poor Dentition   Pulmonary shortness of breath and with exertion, sleep apnea and Oxygen sleep apnea , COPD, Current Smoker,    Pulmonary exam normal        Cardiovascular Exercise Tolerance: Poor hypertension, On Medications Normal cardiovascular exam+ dysrhythmias + pacemaker  Rhythm:regular Rate:Normal     Neuro/Psych PSYCHIATRIC DISORDERS Anxiety Depression  Neuromuscular disease    GI/Hepatic Neg liver ROS, PUD, GERD  ,  Endo/Other  negative endocrine ROS  Renal/GU Renal disease  negative genitourinary   Musculoskeletal   Abdominal   Peds  Hematology negative hematology ROS (+)   Anesthesia Other Findings Past Medical History: No date: Arthritis     Comment:  hands, lower back No date: Barrett's esophagus No date: COPD (chronic obstructive pulmonary disease) ([facility]) No date: Emphysema lung ([facility]) No date: GERD (gastroesophageal reflux disease) No date: Hernia of abdominal cavity No date: Hyperlipidemia No date: Hypertension No date: Mobitz type II atrioventricular block No date: OSA (obstructive sleep apnea)     Comment:  on CPAP No date: Peripheral neuropathy 09/22/11: Presence of permanent cardiac pacemaker     Comment:  Biotronik - EVIADR-T [facility]) No date: Shortness of breath dyspnea No date: Stomach ulcer Past Surgical History: 2006: APPENDECTOMY     Comment:  Dr. Pat Patrick 05/03/2016: BROW LIFT; Bilateral     Comment:  Procedure: BLEPHAROPLASTY  BILATERAL UPPER EYELIDS WITH               EXCESS SKIN REMOVAL BILATERAL BROW PTOSIS REPAIR               BLEPHAROPTOSIS REPAIR BILATERAL EYES ;  Surgeon: Karle Starch, MD;  Location: [place];  Service:               Ophthalmology;  Laterality: Bilateral; 2007: CARDIAC CATHETERIZATION No date: COLON SURGERY 04/14/2015: COLONOSCOPY WITH PROPOFOL; N/A     Comment:  Procedure: COLONOSCOPY WITH PROPOFOL;  Surgeon: Lollie Sails, MD;  Location: [facility] ENDOSCOPY;  Service:               Endoscopy;  Laterality: N/A; 04/14/2015: ESOPHAGOGASTRODUODENOSCOPY; N/A     Comment:  Procedure: ESOPHAGOGASTRODUODENOSCOPY (EGD);  Surgeon:               Lollie Sails, MD;  Location: [facility] ENDOSCOPY;                Service: Endoscopy;  Laterality: N/A; 07/05/2017: ESOPHAGOGASTRODUODENOSCOPY (EGD) WITH PROPOFOL; N/A     Comment:  Procedure: ESOPHAGOGASTRODUODENOSCOPY (EGD) WITH               PROPOFOL;  Surgeon: Manya Silvas, MD;  Location:               [facility] ENDOSCOPY;  Service: Endoscopy;  Laterality: N/A; 2007: HIATAL HERNIA REPAIR 06/22/2015: LAPAROSCOPIC RIGHT HEMI COLECTOMY; Right     Comment:  Procedure: LAPAROSCOPIC RIGHT HEMI COLECTOMY;  Surgeon:               Marlyce Huge, MD;  Location: [facility] ORS;  Service:              General;  Laterality: Right; 4827: NISSEN FUNDOPLICATION 07/86/75: PACEMAKER INSERTION     Comment:  [facility]  - Biotronik EVIADR-T 1980: TOTAL ABDOMINAL HYSTERECTOMY BMI    Body Mass Index:  23.30 kg/m     Reproductive/Obstetrics negative OB ROS                             Anesthesia Physical  Anesthesia Plan  ASA: III  Anesthesia Plan: General   Post-op Pain Management:    Induction: Intravenous  PONV Risk Score and Plan: Propofol infusion and TIVA  Airway Management Planned: Nasal Cannula  Additional Equipment:   Intra-op Plan:   Post-operative Plan:   Informed Consent: I have reviewed the patients History and Physical, chart, labs and discussed the procedure including the risks, benefits and alternatives for the proposed anesthesia with the  patient or authorized representative who has indicated his/her understanding and acceptance.     Dental Advisory Given  Plan Discussed with: CRNA  Anesthesia Plan Comments:         Anesthesia Quick Evaluation

## 2019-06-11 NOTE — Op Note (Signed)
[facility] Gastroenterology Patient Name: Crystal White Procedure Date: 06/11/2019 10:20 AM MRN: [id number] Account #: [id number] Date of Birth: 1941-06-26 Admit Type: Outpatient Age: 78 Room: [facility] ENDO ROOM 3 Gender: Female Note Status: Finalized Procedure:            Colonoscopy Indications:          Adenomatous polyps in the colon, Follow-up for history                        of colon /colonic ulcer/polyps of uncertain behavior Providers:            Lollie Sails, MD Referring MD:         Janine Ores. Rosanna Randy, MD (Referring MD) Medicines:            Monitored Anesthesia Care Complications:        No immediate complications. Procedure:            Pre-Anesthesia Assessment:                       - ASA Grade Assessment: III - A patient with severe                        systemic disease.                       After obtaining informed consent, the colonoscope was                        passed under direct vision. Throughout the procedure,                        the patient's blood pressure, pulse, and oxygen                        saturations were monitored continuously. The                        Colonoscope was introduced through the anus and                        advanced to the the ileocolonic anastomosis. The                        colonoscopy was performed with moderate difficulty due                        to a tortuous colon. The patient tolerated the                        procedure well. The quality of the bowel preparation                        was good. Findings:      Multiple small and large-mouthed diverticula were found in the rectum,       sigmoid colon, descending colon and transverse colon.      There was evidence of a prior functional end-to-end ileo-colonic       anastomosis in the distal ascending colon. This was patent and was       characterized by healthy appearing mucosa.      Biopsies for  histology were taken with a cold forceps  from the right       colon and left colon for evaluation of microscopic colitis.      A 5 mm polyp was found in the sigmoid colon. The polyp was sessile. The       polyp was removed with a cold snare. Resection and retrieval were       complete.      A 10 mm polyp was found at 28 cm proximal to the anus. The polyp was       sessile. The polyp was removed with a cold snare. Resection and       retrieval were complete.      Non-bleeding internal hemorrhoids were found during anoscopy. The       hemorrhoids were small and Grade I (internal hemorrhoids that do not       prolapse). Impression:           - Diverticulosis in the rectum, in the sigmoid colon,                        in the descending colon and in the transverse colon.                       - Patent functional end-to-end ileo-colonic                        anastomosis, characterized by healthy appearing mucosa.                       - One 5 mm polyp in the sigmoid colon, removed with a                        cold snare. Resected and retrieved.                       - One 10 mm polyp at 28 cm proximal to the anus,                        removed with a cold snare. Resected and retrieved.                       - Biopsies were taken with a cold forceps from the                        right colon and left colon for evaluation of                        microscopic colitis. Recommendation:       - Await pathology results.                       - Telephone GI clinic for pathology results in 5 days. Procedure Code(s):    --- Professional ---                       [phone], Colonoscopy, flexible; with removal of tumor(s),                        polyp(s), or other lesion(s) by snare technique  89373, 3, Colonoscopy, flexible; with biopsy, single                        or multiple Diagnosis Code(s):    --- Professional ---                       Z98.0, Intestinal bypass and anastomosis status                       K63.5,  Polyp of colon                       D12.6, Benign neoplasm of colon, unspecified                       Z86.03, Personal history of neoplasm of uncertain                        behavior                       K57.30, Diverticulosis of large intestine without                        perforation or abscess without bleeding CPT copyright 2019 American [employer]. All rights reserved. The codes documented in this report are preliminary and upon coder review may  be revised to meet current compliance requirements. Lollie Sails, MD 06/11/2019 11:16:24 AM This report has been signed electronically. Number of Addenda: 0 Note Initiated On: 06/11/2019 10:20 AM Scope Withdrawal Time: 0 hours 27 minutes 41 seconds  Total Procedure Duration: 0 hours 34 minutes 5 seconds       [facility]

## 2019-06-12 ENCOUNTER — Encounter: Payer: Self-pay | Admitting: Gastroenterology

## 2019-06-12 NOTE — Anesthesia Postprocedure Evaluation (Signed)
Anesthesia Post Note  Patient: Crystal White  Procedure(s) Performed: COLONOSCOPY WITH PROPOFOL (N/A )  Patient location during evaluation: Endoscopy Anesthesia Type: General Level of consciousness: awake and alert and oriented Pain management: pain level controlled Vital Signs Assessment: post-procedure vital signs reviewed and stable Respiratory status: spontaneous breathing Cardiovascular status: blood pressure returned to baseline Anesthetic complications: no     Last Vitals:  Vitals:   06/11/19 1130 06/11/19 1140  BP: (!) 157/79 (!) 164/75  Pulse: 65 79  Resp: 20 (!) 22  Temp:    SpO2: 98% 96%    Last Pain:  Vitals:   06/11/19 0956  TempSrc: Tympanic  PainSc: 0-No pain                 Dyrell Tuccillo

## 2019-06-13 DIAGNOSIS — J449 Chronic obstructive pulmonary disease, unspecified: Secondary | ICD-10-CM | POA: Diagnosis not present

## 2019-06-13 LAB — SURGICAL PATHOLOGY

## 2019-07-08 DIAGNOSIS — K2270 Barrett's esophagus without dysplasia: Secondary | ICD-10-CM | POA: Diagnosis not present

## 2019-07-08 DIAGNOSIS — K224 Dyskinesia of esophagus: Secondary | ICD-10-CM | POA: Diagnosis not present

## 2019-07-14 DIAGNOSIS — J449 Chronic obstructive pulmonary disease, unspecified: Secondary | ICD-10-CM | POA: Diagnosis not present

## 2019-07-17 DIAGNOSIS — Z972 Presence of dental prosthetic device (complete) (partial): Secondary | ICD-10-CM | POA: Insufficient documentation

## 2019-08-14 DIAGNOSIS — J449 Chronic obstructive pulmonary disease, unspecified: Secondary | ICD-10-CM | POA: Diagnosis not present

## 2019-08-19 ENCOUNTER — Ambulatory Visit (INDEPENDENT_AMBULATORY_CARE_PROVIDER_SITE_OTHER): Payer: Medicare Other

## 2019-08-19 ENCOUNTER — Ambulatory Visit
Admission: EM | Admit: 2019-08-19 | Discharge: 2019-08-19 | Disposition: A | Discharge status: Home / Self Care | Payer: Medicare Other | Attending: Family Medicine | Admitting: Family Medicine

## 2019-08-19 ENCOUNTER — Other Ambulatory Visit: Payer: Self-pay

## 2019-08-19 DIAGNOSIS — S52502A Unspecified fracture of the lower end of left radius, initial encounter for closed fracture: Secondary | ICD-10-CM | POA: Diagnosis not present

## 2019-08-19 DIAGNOSIS — S01112A Laceration without foreign body of left eyelid and periocular area, initial encounter: Secondary | ICD-10-CM | POA: Diagnosis not present

## 2019-08-19 DIAGNOSIS — S42402A Unspecified fracture of lower end of left humerus, initial encounter for closed fracture: Secondary | ICD-10-CM

## 2019-08-19 DIAGNOSIS — S52352A Displaced comminuted fracture of shaft of radius, left arm, initial encounter for closed fracture: Secondary | ICD-10-CM | POA: Diagnosis not present

## 2019-08-19 DIAGNOSIS — S0990XA Unspecified injury of head, initial encounter: Secondary | ICD-10-CM

## 2019-08-19 DIAGNOSIS — M25532 Pain in left wrist: Secondary | ICD-10-CM | POA: Diagnosis not present

## 2019-08-19 DIAGNOSIS — W19XXXA Unspecified fall, initial encounter: Secondary | ICD-10-CM

## 2019-08-19 DIAGNOSIS — M79632 Pain in left forearm: Secondary | ICD-10-CM | POA: Diagnosis not present

## 2019-08-19 DIAGNOSIS — Z01812 Encounter for preprocedural laboratory examination: Secondary | ICD-10-CM | POA: Diagnosis not present

## 2019-08-19 DIAGNOSIS — M25422 Effusion, left elbow: Secondary | ICD-10-CM | POA: Diagnosis not present

## 2019-08-19 DIAGNOSIS — Z1159 Encounter for screening for other viral diseases: Secondary | ICD-10-CM | POA: Diagnosis not present

## 2019-08-19 DIAGNOSIS — S52612A Displaced fracture of left ulna styloid process, initial encounter for closed fracture: Secondary | ICD-10-CM | POA: Diagnosis not present

## 2019-08-19 DIAGNOSIS — S52602A Unspecified fracture of lower end of left ulna, initial encounter for closed fracture: Secondary | ICD-10-CM | POA: Diagnosis not present

## 2019-08-19 DIAGNOSIS — M25522 Pain in left elbow: Secondary | ICD-10-CM | POA: Diagnosis not present

## 2019-08-19 DIAGNOSIS — S52122A Displaced fracture of head of left radius, initial encounter for closed fracture: Secondary | ICD-10-CM | POA: Diagnosis not present

## 2019-08-19 MED ORDER — HYDROCODONE-ACETAMINOPHEN 5-325 MG PO TABS: ORAL_TABLET | ORAL | 0 refills | Status: DC

## 2019-08-19 NOTE — ED Provider Notes (Signed)
[facility]    CSN: [id number] Arrival date & time: 08/19/19  1134      History   Chief Complaint Chief Complaint  Patient presents with  . Fall  . Wrist Pain  . Head Laceration    HPI Crystal White is a 78 y.o. female.   78 yo female with a c/o "feeling woozy in the head", cut to forehead, and left elbow and wrist pain with swelling after falling at home this morning about 1 hour ago. States she tripped and landed on the hard floor hitting the left side of her forehead and her left elbow and hand. Denies any loss of consciousness, vision changes, vomiting, numbness.      Past Medical History:  Diagnosis Date  . Arthritis    hands, lower back  . Barrett's esophagus   . COPD (chronic obstructive pulmonary disease) ([place])   . Emphysema lung ([place])   . GERD (gastroesophageal reflux disease)   . Hernia of abdominal cavity   . Hyperlipidemia   . Hypertension   . Mobitz type II atrioventricular block   . OSA (obstructive sleep apnea)    on CPAP  . Peripheral neuropathy   . Presence of permanent cardiac pacemaker 09/22/11   Biotronik - EVIADR-T [facility])  . Shortness of breath dyspnea   . Stomach ulcer     Patient Active Problem List   Diagnosis Date Noted  . Incisional hernia, without obstruction or gangrene 06/03/2016  . Osteoporosis 02/22/2016  . Musculoskeletal chest pain 07/14/2015  . Chronic respiratory failure ([place]) 07/14/2015  . Acute bronchitis 07/14/2015  . Other emphysema ([place]) 07/14/2015  . Chronic renal insufficiency 07/14/2015  . Chest pain 07/13/2015  . Multiple pulmonary nodules 06/02/2015  . Colon polyp 05/29/2015  . Hemoptysis 04/29/2015  . Sprain of ankle 04/14/2015  . Anxiety 04/14/2015  . Barrett's esophagus 04/14/2015  . Acute exacerbation of chronic obstructive airways disease ([place]) 04/14/2015  . Bursitis 04/14/2015  . Cellulitis 04/14/2015  . Claudication ([place]) 04/14/2015  . Cough 04/14/2015  . Deficiency,  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  . Clinical depression 04/14/2015  . Dizziness 04/14/2015  . Dyslipidemia 04/14/2015  . Essential (primary) hypertension 04/14/2015  . Flank pain 04/14/2015  . H/O suicide attempt 04/14/2015  . Dysphonia 04/14/2015  . Hypercholesteremia 04/14/2015  . Cannot sleep 04/14/2015  . Malaise and fatigue 04/14/2015  . Mild cognitive impairment 04/14/2015  . Cramp in muscle 04/14/2015  . Muscle ache 04/14/2015  . Neuropathy 04/14/2015  . Adiposity 04/14/2015  . 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 Candida infection of mouth 04/14/2015  . Abnormal loss of weight 04/14/2015  . Decreased body weight 04/14/2015  . Artificial cardiac pacemaker 10/08/2014  . Apnea, sleep 10/08/2014  . COPD (chronic obstructive pulmonary disease) ([place]) 02/13/2012  . Tobacco abuse 02/13/2012  . Sinus congestion 02/13/2012  . Current tobacco use 02/13/2012  . Esophagitis, reflux 08/28/2006  . Abdominal pain, right lower quadrant 08/22/2005  . Acute appendicitis with peritoneal abscess 08/22/2005    Past Surgical History:  Procedure Laterality Date  . APPENDECTOMY  2006   Dr. Pat Patrick  . BROW LIFT Bilateral 05/03/2016   Procedure: BLEPHAROPLASTY  BILATERAL UPPER EYELIDS WITH EXCESS SKIN REMOVAL BILATERAL BROW PTOSIS REPAIR BLEPHAROPTOSIS REPAIR BILATERAL EYES ;  Surgeon: Karle Starch, MD;  Location: [place];  Service: Ophthalmology;  Laterality: Bilateral;  . CARDIAC CATHETERIZATION  2007  . COLON SURGERY    . COLONOSCOPY WITH PROPOFOL N/A 04/14/2015   Procedure: COLONOSCOPY WITH  PROPOFOL;  Surgeon: Lollie Sails, MD;  Location: [facility] ENDOSCOPY;  Service: Endoscopy;  Laterality: N/A;  . COLONOSCOPY WITH PROPOFOL N/A 01/24/2019   Procedure: COLONOSCOPY WITH PROPOFOL;  Surgeon: Lollie Sails, MD;  Location: [facility] ENDOSCOPY;  Service: Endoscopy;  Laterality: N/A;  . COLONOSCOPY WITH PROPOFOL N/A 06/11/2019   Procedure: COLONOSCOPY WITH PROPOFOL;  Surgeon: Lollie Sails, MD;  Location: [facility] ENDOSCOPY;  Service: Endoscopy;  Laterality: N/A;  . ESOPHAGOGASTRODUODENOSCOPY N/A 04/14/2015   Procedure: ESOPHAGOGASTRODUODENOSCOPY (EGD);  Surgeon: Lollie Sails, MD;  Location: [facility] ENDOSCOPY;  Service: Endoscopy;  Laterality: N/A;  . ESOPHAGOGASTRODUODENOSCOPY (EGD) WITH PROPOFOL N/A 07/05/2017   Procedure: ESOPHAGOGASTRODUODENOSCOPY (EGD) WITH PROPOFOL;  Surgeon: Manya Silvas, MD;  Location: [facility] ENDOSCOPY;  Service: Endoscopy;  Laterality: N/A;  . ESOPHAGOGASTRODUODENOSCOPY (EGD) WITH PROPOFOL N/A 01/24/2019   Procedure: ESOPHAGOGASTRODUODENOSCOPY (EGD) WITH PROPOFOL;  Surgeon: Lollie Sails, MD;  Location: [facility] ENDOSCOPY;  Service: Endoscopy;  Laterality: N/A;  . HERNIA REPAIR    . HIATAL HERNIA REPAIR  2007  . INSERT / REPLACE / REMOVE PACEMAKER    . LAPAROSCOPIC RIGHT HEMI COLECTOMY Right 06/22/2015   Procedure: LAPAROSCOPIC RIGHT HEMI COLECTOMY;  Surgeon: Marlyce Huge, MD;  Location: [facility] ORS;  Service: General;  Laterality: Right;  . NISSEN FUNDOPLICATION  [id number]  . PACEMAKER INSERTION  09/22/11   [facility]  - Biotronik EVIADR-T  . TOTAL ABDOMINAL HYSTERECTOMY  1980    OB History   No obstetric history on file.      Home Medications    Prior to Admission medications   Medication Sig Start Date End Date Taking? Authorizing Provider  acetaminophen (TYLENOL) 500 MG tablet Take 500 mg by mouth every 6 (six) hours as needed.   Yes [provider]  albuterol (PROVENTIL HFA) 108 (90 Base) MCG/ACT inhaler Inhale 2 puffs into the lungs every 6 (six) hours as needed for wheezing or shortness of breath. 01/13/18  Yes Birdie Sons, MD  BREO ELLIPTA 100-25 MCG/INH AEPB USE 1 INHALATION DAILY 01/28/19  Yes Jerrol Banana., MD  citalopram (CELEXA) 10 MG tablet TAKE 1 TABLET BY MOUTH AT  BEDTIME 05/09/19  Yes Jerrol Banana., MD  citalopram (CELEXA) 20 MG tablet Take 1 tablet (20 mg total) by mouth daily. 03/26/19  Yes Jerrol Banana., MD  ezetimibe (ZETIA) 10 MG tablet TAKE 1 TABLET BY MOUTH  DAILY 05/09/19  Yes Jerrol Banana., MD  gabapentin (NEURONTIN) 100 MG capsule TAKE 2 CAPSULES BY MOUTH 3  TIMES DAILY 05/09/19  Yes Jerrol Banana., MD  hydrochlorothiazide (HYDRODIURIL) 25 MG tablet TAKE 1 TABLET BY MOUTH  DAILY 05/09/19  Yes Jerrol Banana., MD  losartan (COZAAR) 50 MG tablet TAKE 1 TABLET BY MOUTH AT  BEDTIME 05/09/19  Yes Jerrol Banana., MD  meclizine (ANTIVERT) 25 MG tablet Take 1 tablet (25 mg total) by mouth 2 (two) times daily as needed for dizziness. 09/14/16  Yes Jerrol Banana., MD  mirtazapine (REMERON) 30 MG tablet TAKE 1 TABLET BY MOUTH AT  BEDTIME 05/09/19  Yes Jerrol Banana., MD  mirtazapine (REMERON) 45 MG tablet Take 1 tablet (45 mg total) by mouth at bedtime. 03/26/19  Yes Jerrol Banana., MD  ondansetron (ZOFRAN) 4 MG tablet Take 1 tablet (4 mg total) by mouth every 8 (eight) hours as needed for nausea or vomiting. 10/19/18  Yes Chrismon, Vickki Muff, PA  OXYGEN Inhale into the  lungs daily. Uses at night with CPAP and PRN during daytime   Yes [provider]  pantoprazole (PROTONIX) 40 MG tablet TAKE 1 TABLET BY MOUTH TWO  TIMES DAILY 05/09/19  Yes Jerrol Banana., MD  traZODone (DESYREL) 150 MG tablet TAKE 1 TABLET BY MOUTH AT  BEDTIME 05/09/19  Yes Jerrol Banana., MD  alendronate (FOSAMAX) 70 MG tablet Take 1 tablet (70 mg total) by mouth once a week. Patient not taking: Reported on 04/23/2019 06/06/18   Jerrol Banana., MD  ALPRAZolam Duanne Moron) 0.25 MG tablet Take 1 tablet (0.25 mg total) by mouth 2 (two) times daily. Patient not taking: Reported on 04/23/2019 09/15/16   Jerrol Banana., MD  HYDROcodone-acetaminophen (NORCO/VICODIN) 5-325 MG tablet 1-2 tabs po qd 08/19/19   Norval Gable, MD  hyoscyamine (LEVBID) 0.375 MG 12 hr tablet Take 1 tablet (0.375 mg total) by mouth 2 (two) times daily as needed for  cramping. Reported on 05/03/2016 09/14/16   Jerrol Banana., MD  traMADol (ULTRAM) 50 MG tablet Take 1 tablet (50 mg total) every 8 (eight) hours as needed by mouth. 10/03/17   Jerrol Banana., MD    Family History Family History  Adopted: Yes  Problem Relation Age of Onset  . Diabetes Son     Social History Social History   Tobacco Use  . Smoking status: Current Every Day Smoker    Packs/day: 1.00    Years: 55.00    Pack years: 55.00    Types: Cigarettes  . Smokeless tobacco: Never Used  Substance Use Topics  . Alcohol use: Yes    Alcohol/week: 0.0 standard drinks    Comment: 1/2 glass wine -rare  . Drug use: No     Allergies   Iodinated diagnostic agents, Statins, and Bacitracin   Review of Systems Review of Systems   Physical Exam Triage Vital Signs ED Triage Vitals  Enc Vitals Group     BP 08/19/19 1151 (!) 150/90     Pulse Rate 08/19/19 1151 88     Resp 08/19/19 1151 18     Temp 08/19/19 1151 97.7 F (36.5 C)     Temp Source 08/19/19 1151 Oral     SpO2 08/19/19 1151 97 %     Weight 08/19/19 1150 130 lb (59 kg)     Height 08/19/19 1150 5\' 4"  (1.626 m)     Head Circumference --      Peak Flow --      Pain Score 08/19/19 1148 9     Pain Loc --      Pain Edu? --      Excl. in [place]? --    No data found.  Updated Vital Signs BP (!) 150/90 (BP Location: Left Arm)   Pulse 88   Temp 97.7 F (36.5 C) (Oral)   Resp 18   Ht 5\' 4"  (1.626 m)   Wt 59 kg   SpO2 97%   BMI 22.31 kg/m   Visual Acuity Right Eye Distance:   Left Eye Distance:   Bilateral Distance:    Right Eye Near:   Left Eye Near:    Bilateral Near:     Physical Exam Vitals signs and nursing note reviewed.  Constitutional:      General: She is not in acute distress.    Appearance: She is not toxic-appearing or diaphoretic.  HENT:     Head: Normocephalic. Contusion (to left forehead with ecchymosis 4cm) and  laceration (to left forehead above left eyebrow) present. No  raccoon eyes or Battle's sign.     Right Ear: Tympanic membrane, ear canal and external ear normal.     Left Ear: Tympanic membrane, ear canal and external ear normal.     Nose: Nose normal.     Mouth/Throat:     Pharynx: Oropharynx is clear.  Eyes:     Extraocular Movements: Extraocular movements intact.     Pupils: Pupils are equal, round, and reactive to light.  Neck:     Musculoskeletal: Normal range of motion and neck supple. No muscular tenderness.  Pulmonary:     Effort: Pulmonary effort is normal. No respiratory distress.  Musculoskeletal:     Left elbow: She exhibits decreased range of motion and swelling. She exhibits no laceration. Tenderness found. Radial head and lateral epicondyle tenderness noted. No medial epicondyle and no olecranon process tenderness noted.     Left wrist: She exhibits decreased range of motion, tenderness, bony tenderness, swelling and deformity. She exhibits no laceration.     Comments: Left hand/upper extremity neurovascularly intact  Neurological:     General: No focal deficit present.     Mental Status: She is alert and oriented to person, place, and time.     Cranial Nerves: No cranial nerve deficit.      UC Treatments / Results  Labs (all labs ordered are listed, but only abnormal results are displayed) Labs Reviewed - No data to display  EKG   Radiology Dg Elbow Complete Left  Result Date: 08/19/2019 CLINICAL DATA:  Fall. EXAM: LEFT ELBOW - COMPLETE 3+ VIEW COMPARISON:  No recent. FINDINGS: Prominent elbow joint effusion. A very subtle nondisplaced fracture of the proximal radial neck cannot be excluded. Diffuse osteopenia and degenerative change. IMPRESSION: Prominent elbow joint effusion. A very subtle nondisplaced fracture of the proximal radial neck cannot be excluded. Follow-up imaging in 7-10 days may prove useful for further evaluation Electronically Signed   By: [place]   On: 08/19/2019 12:39   Dg Forearm Left  Result  Date: 08/19/2019 CLINICAL DATA:  Swelling and pain after a fall this morning. EXAM: LEFT FOREARM - 2 VIEW COMPARISON:  None. FINDINGS: There is a dorsally impacted fracture of the distal left radial metaphysis. There is also avulsion of the tip of the ulnar styloid. Osteopenia. Elbow joint effusion. IMPRESSION: Fractures of the distal left radius and ulna as described. Elbow joint effusion. No other significant abnormalities. Electronically Signed   By: Lorriane Shire M.D.   On: 08/19/2019 12:40   Dg Wrist Complete Left  Result Date: 08/19/2019 CLINICAL DATA:  Fall.  Limited range of motion. EXAM: LEFT WRIST - COMPLETE 3+ VIEW COMPARISON:  None. FINDINGS: Comminuted fracture of the distal radius extends to the articular surface. There is no significant scratched at there is minimal angulation. Carpal bones are intact. Diffuse osteopenia is present. Minimally displaced ulnar styloid fracture is noted. IMPRESSION: 1. Comminuted fracture distal radius with intra-articular extension. 2. Minimally displaced ulnar styloid fracture. Electronically Signed   By: [place] M.D.   On: 08/19/2019 12:39   Ct Head Wo Contrast  Result Date: 08/19/2019 CLINICAL DATA:  Head trauma. EXAM: CT HEAD WITHOUT CONTRAST TECHNIQUE: Contiguous axial images were obtained from the base of the skull through the vertex without intravenous contrast. COMPARISON:  Head CT dated 01/17/2012. FINDINGS: Brain: Ventricles are within normal limits in size and configuration. Benign calcified meningioma at the RIGHT occipital lobe vertex. Probable additional partially calcified  meningioma in the RIGHT CP angle. No mass, hemorrhage, edema or other evidence of acute parenchymal abnormality. No extra-axial hemorrhage. Vascular: Chronic calcified atherosclerotic changes of the large vessels at the skull base. No unexpected hyperdense vessel. Skull: No skull fracture or dislocation. Sinuses/Orbits: No acute finding. Other: None. IMPRESSION: No  acute findings. No intracranial or edema. No skull fracture. Electronically Signed   By: Franki Cabot M.D.   On: 08/19/2019 13:40    Procedures Laceration Repair  Date/Time: 08/19/2019 8:36 PM Performed by: Norval Gable, MD Authorized by: Norval Gable, MD   Consent:    Consent obtained:  Verbal   Consent given by:  Patient   Risks discussed:  Infection, need for additional repair, poor cosmetic result, poor wound healing, vascular damage and retained foreign body   Alternatives discussed:  No treatment Anesthesia (see MAR for exact dosages):    Anesthesia method:  Topical application   Topical anesthetic:  LET Laceration details:    Location:  Face   Face location:  Forehead (above left eyebrow)   Length (cm):  1 Repair type:    Repair type:  Simple Exploration:    Hemostasis achieved with:  Direct pressure and LET   Wound exploration: wound explored through full range of motion     Wound extent: areolar tissue violated     Contaminated: no   Treatment:    Area cleansed with:  Hibiclens   Amount of cleaning:  Standard Skin repair:    Repair method:  Tissue adhesive Approximation:    Approximation:  Close Post-procedure details:    Dressing:  Open (no dressing)   Patient tolerance of procedure:  Tolerated well, no immediate complications   (including critical care time)  Medications Ordered in UC Medications - No data to display  Initial Impression / Assessment and Plan / UC Course  I have reviewed the triage vital signs and the nursing notes.  Pertinent labs & imaging results that were available during my care of the patient were reviewed by me and considered in my medical decision making (see chart for details).      Final Clinical Impressions(s) / UC Diagnoses   Final diagnoses:  Closed fracture of distal ends of left radius and ulna, initial encounter  Closed fracture of left elbow, initial encounter  Eyebrow laceration, left, initial encounter   Traumatic injury of head, initial encounter     Discharge Instructions     Follow up with Orthopedist this week    ED Prescriptions    Medication Sig Dispense Auth. Provider   HYDROcodone-acetaminophen (NORCO/VICODIN) 5-325 MG tablet 1-2 tabs po qd 6 tablet Loreto Loescher, Linward Foster, MD     1. x-ray results and diagnosis reviewed with patient 2. rx as per orders above; reviewed possible side effects, interactions, risks and benefits  3. Recommend supportive treatment with rest, over the counter tylenol 4. Left arm immobilized with long arm splint and sling  5. Procedure for laceration as per note above 6. Follow up with orthopedist in the next 2 days 7. Follow-up prn   I have reviewed the PDMP during this encounter.   Norval Gable, MD 08/19/19 2045

## 2019-08-19 NOTE — Discharge Instructions (Signed)
Follow up with Orthopedist this week

## 2019-08-19 NOTE — ED Triage Notes (Signed)
Patient states that she fell at home around 1 hour ago. Patient states that she had just got back home and states that she hit the floor. Patient states that her pain is mostly in left wrist. Patient also has a gash abover her right eye and states that she hit her head on the floor as well.

## 2019-08-20 DIAGNOSIS — W010XXA Fall on same level from slipping, tripping and stumbling without subsequent striking against object, initial encounter: Secondary | ICD-10-CM | POA: Diagnosis not present

## 2019-08-20 DIAGNOSIS — S52552A Other extraarticular fracture of lower end of left radius, initial encounter for closed fracture: Secondary | ICD-10-CM | POA: Diagnosis not present

## 2019-08-21 ENCOUNTER — Other Ambulatory Visit: Admission: RE | Admit: 2019-08-21 | Payer: Medicare Other | Source: Ambulatory Visit

## 2019-08-21 MED ORDER — FENTANYL CITRATE (PF) 100 MCG/2ML IJ SOLN: 25.0000 ug | INTRAMUSCULAR | Status: DC | PRN

## 2019-08-21 MED ORDER — ONDANSETRON HCL 4 MG/2ML IJ SOLN: 4.0000 mg | Freq: Once | INTRAMUSCULAR | Status: DC | PRN

## 2019-08-21 MED ORDER — SODIUM CHLORIDE 0.9 % IV SOLN: INTRAVENOUS | Status: DC

## 2019-08-22 ENCOUNTER — Other Ambulatory Visit: Payer: Self-pay

## 2019-08-22 ENCOUNTER — Ambulatory Visit: Payer: Medicare Other | Admitting: Certified Registered"

## 2019-08-22 ENCOUNTER — Ambulatory Visit: Payer: Medicare Other

## 2019-08-22 ENCOUNTER — Encounter
Admission: RE | Disposition: A | Discharge status: Home / Self Care | Payer: Self-pay | Source: Home / Self Care | Attending: Orthopedic Surgery

## 2019-08-22 ENCOUNTER — Ambulatory Visit
Admission: RE | Admit: 2019-08-22 | Discharge: 2019-08-22 | Disposition: A | Discharge status: Home / Self Care | Payer: Medicare Other | Attending: Orthopedic Surgery | Admitting: Orthopedic Surgery

## 2019-08-22 DIAGNOSIS — Z888 Allergy status to other drugs, medicaments and biological substances status: Secondary | ICD-10-CM | POA: Diagnosis not present

## 2019-08-22 DIAGNOSIS — G629 Polyneuropathy, unspecified: Secondary | ICD-10-CM | POA: Diagnosis not present

## 2019-08-22 DIAGNOSIS — G4733 Obstructive sleep apnea (adult) (pediatric): Secondary | ICD-10-CM | POA: Insufficient documentation

## 2019-08-22 DIAGNOSIS — Z79891 Long term (current) use of opiate analgesic: Secondary | ICD-10-CM | POA: Insufficient documentation

## 2019-08-22 DIAGNOSIS — S52592A Other fractures of lower end of left radius, initial encounter for closed fracture: Secondary | ICD-10-CM | POA: Diagnosis not present

## 2019-08-22 DIAGNOSIS — K219 Gastro-esophageal reflux disease without esophagitis: Secondary | ICD-10-CM | POA: Diagnosis not present

## 2019-08-22 DIAGNOSIS — I10 Essential (primary) hypertension: Secondary | ICD-10-CM | POA: Insufficient documentation

## 2019-08-22 DIAGNOSIS — Z9981 Dependence on supplemental oxygen: Secondary | ICD-10-CM | POA: Insufficient documentation

## 2019-08-22 DIAGNOSIS — M1990 Unspecified osteoarthritis, unspecified site: Secondary | ICD-10-CM | POA: Diagnosis not present

## 2019-08-22 DIAGNOSIS — Z950 Presence of cardiac pacemaker: Secondary | ICD-10-CM | POA: Diagnosis not present

## 2019-08-22 DIAGNOSIS — F17200 Nicotine dependence, unspecified, uncomplicated: Secondary | ICD-10-CM | POA: Insufficient documentation

## 2019-08-22 DIAGNOSIS — E785 Hyperlipidemia, unspecified: Secondary | ICD-10-CM | POA: Insufficient documentation

## 2019-08-22 DIAGNOSIS — Z7951 Long term (current) use of inhaled steroids: Secondary | ICD-10-CM | POA: Diagnosis not present

## 2019-08-22 DIAGNOSIS — J439 Emphysema, unspecified: Secondary | ICD-10-CM | POA: Insufficient documentation

## 2019-08-22 DIAGNOSIS — S52502A Unspecified fracture of the lower end of left radius, initial encounter for closed fracture: Secondary | ICD-10-CM | POA: Diagnosis not present

## 2019-08-22 DIAGNOSIS — Z7983 Long term (current) use of bisphosphonates: Secondary | ICD-10-CM | POA: Insufficient documentation

## 2019-08-22 DIAGNOSIS — N189 Chronic kidney disease, unspecified: Secondary | ICD-10-CM | POA: Diagnosis not present

## 2019-08-22 DIAGNOSIS — F419 Anxiety disorder, unspecified: Secondary | ICD-10-CM | POA: Insufficient documentation

## 2019-08-22 DIAGNOSIS — F329 Major depressive disorder, single episode, unspecified: Secondary | ICD-10-CM | POA: Insufficient documentation

## 2019-08-22 DIAGNOSIS — Z9889 Other specified postprocedural states: Secondary | ICD-10-CM

## 2019-08-22 DIAGNOSIS — Z79899 Other long term (current) drug therapy: Secondary | ICD-10-CM | POA: Insufficient documentation

## 2019-08-22 DIAGNOSIS — W010XXA Fall on same level from slipping, tripping and stumbling without subsequent striking against object, initial encounter: Secondary | ICD-10-CM | POA: Insufficient documentation

## 2019-08-22 DIAGNOSIS — J438 Other emphysema: Secondary | ICD-10-CM | POA: Diagnosis not present

## 2019-08-22 DIAGNOSIS — I129 Hypertensive chronic kidney disease with stage 1 through stage 4 chronic kidney disease, or unspecified chronic kidney disease: Secondary | ICD-10-CM | POA: Diagnosis not present

## 2019-08-22 DIAGNOSIS — I441 Atrioventricular block, second degree: Secondary | ICD-10-CM | POA: Diagnosis not present

## 2019-08-22 DIAGNOSIS — S52502D Unspecified fracture of the lower end of left radius, subsequent encounter for closed fracture with routine healing: Secondary | ICD-10-CM | POA: Diagnosis not present

## 2019-08-22 HISTORY — PX: OPEN REDUCTION INTERNAL FIXATION (ORIF) DISTAL RADIAL FRACTURE: SHX5989

## 2019-08-22 SURGERY — OPEN REDUCTION INTERNAL FIXATION (ORIF) DISTAL RADIUS FRACTURE
Anesthesia: General | Laterality: Left

## 2019-08-22 MED ORDER — FENTANYL CITRATE (PF) 100 MCG/2ML IJ SOLN
INTRAMUSCULAR | Status: DC | PRN
  Administered 2019-08-22 (×4): 25 ug via INTRAVENOUS

## 2019-08-22 MED ORDER — HYDROCODONE-ACETAMINOPHEN 5-325 MG PO TABS: 1.0000 | ORAL_TABLET | Freq: Four times a day (QID) | ORAL | 0 refills | Status: DC | PRN

## 2019-08-22 MED ORDER — MORPHINE SULFATE (PF) 4 MG/ML IV SOLN: 0.5000 mg | INTRAVENOUS | Status: DC | PRN

## 2019-08-22 MED ORDER — NEOMYCIN-POLYMYXIN B GU 40-200000 IR SOLN
Status: AC
  Filled 2019-08-22: qty 2

## 2019-08-22 MED ORDER — GLYCOPYRROLATE 0.2 MG/ML IJ SOLN
INTRAMUSCULAR | Status: DC | PRN
  Administered 2019-08-22: 0.2 mg via INTRAVENOUS

## 2019-08-22 MED ORDER — PHENYLEPHRINE HCL (PRESSORS) 10 MG/ML IV SOLN
INTRAVENOUS | Status: DC | PRN
  Administered 2019-08-22: 100 ug via INTRAVENOUS

## 2019-08-22 MED ORDER — METOCLOPRAMIDE HCL 10 MG PO TABS: 5.0000 mg | ORAL_TABLET | Freq: Three times a day (TID) | ORAL | Status: DC | PRN

## 2019-08-22 MED ORDER — PROPOFOL 10 MG/ML IV BOLUS
INTRAVENOUS | Status: AC
  Filled 2019-08-22: qty 20

## 2019-08-22 MED ORDER — ONDANSETRON HCL 4 MG/2ML IJ SOLN: 4.0000 mg | Freq: Once | INTRAMUSCULAR | Status: DC | PRN

## 2019-08-22 MED ORDER — ACETAMINOPHEN 325 MG PO TABS: 325.0000 mg | ORAL_TABLET | Freq: Four times a day (QID) | ORAL | Status: DC | PRN

## 2019-08-22 MED ORDER — HYDROCODONE-ACETAMINOPHEN 5-325 MG PO TABS
1.0000 | ORAL_TABLET | ORAL | Status: DC | PRN
  Administered 2019-08-22: 15:00:00 1 via ORAL

## 2019-08-22 MED ORDER — PROPOFOL 10 MG/ML IV BOLUS
INTRAVENOUS | Status: DC | PRN
  Administered 2019-08-22: 140 mg via INTRAVENOUS

## 2019-08-22 MED ORDER — NEOMYCIN-POLYMYXIN B GU 40-200000 IR SOLN
Status: DC | PRN
  Administered 2019-08-22: 2 mL

## 2019-08-22 MED ORDER — FENTANYL CITRATE (PF) 100 MCG/2ML IJ SOLN
INTRAMUSCULAR | Status: AC
  Filled 2019-08-22: qty 2

## 2019-08-22 MED ORDER — LIDOCAINE HCL (CARDIAC) PF 100 MG/5ML IV SOSY
PREFILLED_SYRINGE | INTRAVENOUS | Status: DC | PRN
  Administered 2019-08-22: 100 mg via INTRAVENOUS

## 2019-08-22 MED ORDER — LACTATED RINGERS IV SOLN
INTRAVENOUS | Status: DC | PRN
  Administered 2019-08-22 (×2): via INTRAVENOUS

## 2019-08-22 MED ORDER — SODIUM CHLORIDE 0.9 % IV SOLN: INTRAVENOUS | Status: DC

## 2019-08-22 MED ORDER — CEFAZOLIN SODIUM-DEXTROSE 2-4 GM/100ML-% IV SOLN: 2.0000 g | Freq: Once | INTRAVENOUS | Status: DC

## 2019-08-22 MED ORDER — ACETAMINOPHEN 500 MG PO TABS: 500.0000 mg | ORAL_TABLET | Freq: Four times a day (QID) | ORAL | Status: DC

## 2019-08-22 MED ORDER — CEFAZOLIN SODIUM-DEXTROSE 1-4 GM/50ML-% IV SOLN
INTRAVENOUS | Status: DC | PRN
  Administered 2019-08-22: 1 g via INTRAVENOUS

## 2019-08-22 MED ORDER — ONDANSETRON HCL 4 MG/2ML IJ SOLN
INTRAMUSCULAR | Status: DC | PRN
  Administered 2019-08-22: 4 mg via INTRAVENOUS

## 2019-08-22 MED ORDER — HYDROCODONE-ACETAMINOPHEN 7.5-325 MG PO TABS
1.0000 | ORAL_TABLET | ORAL | Status: DC | PRN
  Filled 2019-08-22: qty 2

## 2019-08-22 MED ORDER — ACETAMINOPHEN 10 MG/ML IV SOLN
INTRAVENOUS | Status: DC | PRN
  Administered 2019-08-22: 1000 mg via INTRAVENOUS

## 2019-08-22 MED ORDER — DEXAMETHASONE SODIUM PHOSPHATE 10 MG/ML IJ SOLN
INTRAMUSCULAR | Status: DC | PRN
  Administered 2019-08-22: 10 mg via INTRAVENOUS

## 2019-08-22 MED ORDER — FENTANYL CITRATE (PF) 100 MCG/2ML IJ SOLN: 25.0000 ug | INTRAMUSCULAR | Status: DC | PRN

## 2019-08-22 MED ORDER — HYDROCODONE-ACETAMINOPHEN 5-325 MG PO TABS
ORAL_TABLET | ORAL | Status: AC
  Administered 2019-08-22: 1 via ORAL
  Filled 2019-08-22: qty 1

## 2019-08-22 MED ORDER — ONDANSETRON HCL 4 MG/2ML IJ SOLN: 4.0000 mg | Freq: Four times a day (QID) | INTRAMUSCULAR | Status: DC | PRN

## 2019-08-22 MED ORDER — ACETAMINOPHEN 10 MG/ML IV SOLN
INTRAVENOUS | Status: AC
  Filled 2019-08-22: qty 100

## 2019-08-22 MED ORDER — LACTATED RINGERS IV SOLN: INTRAVENOUS | Status: DC

## 2019-08-22 MED ORDER — METOCLOPRAMIDE HCL 5 MG/ML IJ SOLN: 5.0000 mg | Freq: Three times a day (TID) | INTRAMUSCULAR | Status: DC | PRN

## 2019-08-22 MED ORDER — ONDANSETRON HCL 4 MG PO TABS: 4.0000 mg | ORAL_TABLET | Freq: Four times a day (QID) | ORAL | Status: DC | PRN

## 2019-08-22 SURGICAL SUPPLY — 44 items
BIT DRILL 2 FAST STEP (BIT) ×1 IMPLANT
BIT DRILL 2.5X4 QC (BIT) ×1 IMPLANT
BNDG ELASTIC 4X5.8 VLCR STR LF (GAUZE/BANDAGES/DRESSINGS) ×2 IMPLANT
CANISTER SUCT 1200ML W/VALVE (MISCELLANEOUS) ×2 IMPLANT
CHLORAPREP W/TINT 26 (MISCELLANEOUS) ×2 IMPLANT
COVER WAND RF STERILE (DRAPES) ×2 IMPLANT
CUFF TOURN SGL QUICK 18X4 (TOURNIQUET CUFF) IMPLANT
DRAPE FLUOR MINI C-ARM 54X84 (DRAPES) ×2 IMPLANT
DRIVER PEG 2.0 FAST (BIT) ×1 IMPLANT
ELECT REM PT RETURN 9FT ADLT (ELECTROSURGICAL) ×2
ELECTRODE REM PT RTRN 9FT ADLT (ELECTROSURGICAL) ×1 IMPLANT
GAUZE SPONGE 4X4 12PLY STRL (GAUZE/BANDAGES/DRESSINGS) ×2 IMPLANT
GAUZE XEROFORM 1X8 LF (GAUZE/BANDAGES/DRESSINGS) ×4 IMPLANT
GLOVE SURG SYN 9.0  PF PI (GLOVE) ×1
GLOVE SURG SYN 9.0 PF PI (GLOVE) ×1 IMPLANT
GOWN SRG 2XL LVL 4 RGLN SLV (GOWNS) ×1 IMPLANT
GOWN STRL NON-REIN 2XL LVL4 (GOWNS) ×1
GOWN STRL REUS W/ TWL LRG LVL3 (GOWN DISPOSABLE) ×1 IMPLANT
GOWN STRL REUS W/TWL LRG LVL3 (GOWN DISPOSABLE) ×1
K-WIRE 1.6 (WIRE) ×1
K-WIRE FX5X1.6XNS BN SS (WIRE) ×1
KIT TURNOVER KIT A (KITS) ×2 IMPLANT
KWIRE FX5X1.6XNS BN SS (WIRE) IMPLANT
NDL FILTER BLUNT 18X1 1/2 (NEEDLE) ×1 IMPLANT
NEEDLE FILTER BLUNT 18X 1/2SAF (NEEDLE) ×1
NEEDLE FILTER BLUNT 18X1 1/2 (NEEDLE) ×1 IMPLANT
NS IRRIG 500ML POUR BTL (IV SOLUTION) ×2 IMPLANT
PACK EXTREMITY ARMC (MISCELLANEOUS) ×2 IMPLANT
PAD CAST CTTN 4X4 STRL (SOFTGOODS) ×2 IMPLANT
PADDING CAST COTTON 4X4 STRL (SOFTGOODS) ×2
PEG SUBCHONDRAL SMOOTH 2.0X14 (Peg) ×1 IMPLANT
PEG SUBCHONDRAL SMOOTH 2.0X16 (Peg) ×1 IMPLANT
PEG SUBCHONDRAL SMOOTH 2.0X18 (Peg) ×2 IMPLANT
PEG SUBCHONDRAL SMOOTH 2.0X20 (Peg) ×1 IMPLANT
PEG SUBCHONDRAL SMOOTH 2.0X22 (Peg) ×1 IMPLANT
PLATE SHORT 21.6X48.9 NRRW LT (Plate) ×1 IMPLANT
SCALPEL PROTECTED #15 DISP (BLADE) ×4 IMPLANT
SCREW CORT 3.5X10 LNG (Screw) ×3 IMPLANT
SPLINT CAST 1 STEP 3X12 (MISCELLANEOUS) ×2 IMPLANT
SUT ETHILON 4-0 (SUTURE) ×1
SUT ETHILON 4-0 FS2 18XMFL BLK (SUTURE) ×1
SUT VICRYL 3-0 27IN (SUTURE) ×2 IMPLANT
SUTURE ETHLN 4-0 FS2 18XMF BLK (SUTURE) ×1 IMPLANT
SYR 3ML LL SCALE MARK (SYRINGE) ×2 IMPLANT

## 2019-08-22 NOTE — Transfer of Care (Signed)
Immediate Anesthesia Transfer of Care Note  Patient: Crystal White  Procedure(s) Performed: OPEN REDUCTION INTERNAL FIXATION (ORIF) DISTAL RADIAL FRACTURE (Left )  Patient Location: PACU  Anesthesia Type:General  Level of Consciousness: awake, alert  and patient cooperative  Airway & Oxygen Therapy: Patient Spontanous Breathing and Patient connected to face mask oxygen  Post-op Assessment: Report given to RN and Post -op Vital signs reviewed and stable  Post vital signs: Reviewed and stable  Last Vitals:  Vitals Value Taken Time  BP 129/57 08/22/19 1349  Temp    Pulse 83 08/22/19 1351  Resp    SpO2 98 % 08/22/19 1351  Vitals shown include unvalidated device data.  Last Pain:  Vitals:   08/22/19 1215  TempSrc: Tympanic  PainSc: 0-No pain         Complications: No apparent anesthesia complications

## 2019-08-22 NOTE — Anesthesia Procedure Notes (Signed)
Procedure Name: LMA Insertion Performed by: Kelton Pillar, CRNA Pre-anesthesia Checklist: Patient identified, Emergency Drugs available, Suction available and Patient being monitored Patient Re-evaluated:Patient Re-evaluated prior to induction Oxygen Delivery Method: Circle system utilized Preoxygenation: Pre-oxygenation with 100% oxygen Induction Type: IV induction Ventilation: Mask ventilation without difficulty LMA: LMA inserted LMA Size: 4.0 Tube type: Oral Number of attempts: 1 Placement Confirmation: positive ETCO2,  CO2 detector and breath sounds checked- equal and bilateral Dental Injury: Teeth and Oropharynx as per pre-operative assessment

## 2019-08-22 NOTE — Anesthesia Post-op Follow-up Note (Signed)
Anesthesia QCDR form completed.        

## 2019-08-22 NOTE — Anesthesia Postprocedure Evaluation (Signed)
Anesthesia Post Note  Patient: Crystal White  Procedure(s) Performed: OPEN REDUCTION INTERNAL FIXATION (ORIF) DISTAL RADIAL FRACTURE (Left )  Patient location during evaluation: PACU Anesthesia Type: General Level of consciousness: awake and alert Pain management: pain level controlled Vital Signs Assessment: post-procedure vital signs reviewed and stable Respiratory status: spontaneous breathing and respiratory function stable Cardiovascular status: stable Anesthetic complications: no     Last Vitals:  Vitals:   08/22/19 1215  BP: (!) 151/67  Pulse: 88  Resp: 14  Temp: (!) 36.4 C  SpO2: 93%    Last Pain:  Vitals:   08/22/19 1215  TempSrc: Tympanic  PainSc: 0-No pain                 Sindia Kowalczyk K

## 2019-08-22 NOTE — Anesthesia Postprocedure Evaluation (Signed)
Anesthesia Post Note  Patient: Crystal White  Procedure(s) Performed: OPEN REDUCTION INTERNAL FIXATION (ORIF) DISTAL RADIAL FRACTURE (Left )  Patient location during evaluation: PACU Anesthesia Type: General Level of consciousness: awake and alert Pain management: pain level controlled Vital Signs Assessment: post-procedure vital signs reviewed and stable Respiratory status: spontaneous breathing and respiratory function stable Cardiovascular status: stable Anesthetic complications: no     Last Vitals:  Vitals:   08/22/19 1419 08/22/19 1445  BP: (!) 146/75   Pulse: 93 90  Resp: 19 14  Temp:  (!) 36.3 C  SpO2: 93% 98%    Last Pain:  Vitals:   08/22/19 1349  TempSrc:   PainSc: 0-No pain                 KEPHART,WILLIAM K

## 2019-08-22 NOTE — Discharge Instructions (Signed)
Keep arm elevated is much as possible.  Work work fingers is much as you can.  Pain medicine as directed.  If the finger start getting swell swollen it is okay to loosen the Ace wrap but leave the splint and cotton roll in place and rewrap the Ace wrap a little bit looser.  Call office if you are having problems.  AMBULATORY SURGERY  DISCHARGE INSTRUCTIONS   1) The drugs that you were given will stay in your system until tomorrow so for the next 24 hours you should not:  A) Drive an automobile B) Make any legal decisions C) Drink any alcoholic beverage   2) You may resume regular meals tomorrow.  Today it is better to start with liquids and gradually work up to solid foods.  You may eat anything you prefer, but it is better to start with liquids, then soup and crackers, and gradually work up to solid foods.   3) Please notify your doctor immediately if you have any unusual bleeding, trouble breathing, redness and pain at the surgery site, drainage, fever, or pain not relieved by medication.    4) Additional Instructions:        Please contact your physician with any problems or Same Day Surgery at [phone], Monday through Friday 6 am to 4 pm, or [place] at [facility] number at [phone].

## 2019-08-22 NOTE — Anesthesia Preprocedure Evaluation (Signed)
Anesthesia Evaluation  Patient identified by MRN, date of birth, ID band Patient awake    Reviewed: Allergy & Precautions, NPO status , Patient's Chart, lab work & pertinent test results  History of Anesthesia Complications Negative for: history of anesthetic complications  Airway Mallampati: III       Dental  (+) Partial Upper   Pulmonary sleep apnea, Continuous Positive Airway Pressure Ventilation and Oxygen sleep apnea , COPD,  COPD inhaler and oxygen dependent, Current Smoker,           Cardiovascular hypertension, Pt. on medications (-) Past MI and (-) CHF + dysrhythmias (symptomatic bradycardia) + pacemaker (-) Valvular Problems/Murmurs     Neuro/Psych neg Seizures Anxiety Depression    GI/Hepatic Neg liver ROS, PUD, GERD  Medicated and Controlled,  Endo/Other  neg diabetes  Renal/GU Renal InsufficiencyRenal disease     Musculoskeletal   Abdominal   Peds  Hematology   Anesthesia Other Findings   Reproductive/Obstetrics                             Anesthesia Physical Anesthesia Plan  ASA: III  Anesthesia Plan: General   Post-op Pain Management:    Induction: Intravenous  PONV Risk Score and Plan: 2 and Dexamethasone and Ondansetron  Airway Management Planned: LMA  Additional Equipment:   Intra-op Plan:   Post-operative Plan:   Informed Consent: I have reviewed the patients History and Physical, chart, labs and discussed the procedure including the risks, benefits and alternatives for the proposed anesthesia with the patient or authorized representative who has indicated his/her understanding and acceptance.       Plan Discussed with:   Anesthesia Plan Comments:         Anesthesia Quick Evaluation

## 2019-08-22 NOTE — Op Note (Signed)
08/22/2019  1:44 PM  PATIENT:  Crystal White  78 y.o. female  PRE-OPERATIVE DIAGNOSIS:  FRACTURE OF DISTAL END OF LEFT RADIUS to distal fragments  POST-OPERATIVE DIAGNOSIS:  left distal radius fracture same  PROCEDURE:  Procedure(s): OPEN REDUCTION INTERNAL FIXATION (ORIF) DISTAL RADIAL FRACTURE (Left)  SURGEON: Laurene Footman, MD  ASSISTANTS: None  ANESTHESIA:   general  EBL:  Total I/O In: 800 [I.V.:800] Out: -   BLOOD ADMINISTERED:none  DRAINS: none   LOCAL MEDICATIONS USED:  NONE  SPECIMEN:  No Specimen  DISPOSITION OF SPECIMEN:  N/A  COUNTS:  YES  TOURNIQUET:   Total Tourniquet Time Documented: Upper Arm (Left) - 19 minutes Total: Upper Arm (Left) - 19 minutes   IMPLANTS: [employer] innovations short narrow DVR plate with multiple pegs and screws  DICTATION: .Dragon Dictation patient was brought to the operating room and after adequate general anesthesia was obtained the left arm was prepped and draped in the usual sterile fashion.  After patient identification and timeout procedures were completed, tourniquet was raised to her 50 mmHg.  A volar approach was made centered over the FCR tendon.  The tendon sheath was incised and the tendon retracted radially protecting the radial artery and associated veins.  The deep fascia was incised and muscles were retracted with a pronator elevated off the distal fragment and shaft.  At the start of the case fingertrap traction was applied off the end of the bed and this helped restore length but volar tilt and radial inclination was not really restored with use of a Valora Corporal that was in disimpacting the fracture this was also improved.  Distal first technique was utilized with the plate applied to the distal fragment and pinned into position 1 is the 1 and 1 appropriately placed all 6 peg holes filled using standard technique drilling and placing the smooth pegs.  The plate was then brought down to the shaft and screws inserted in the  shaft 10 mm in length followed by removal of traction to make sure there is not over distraction.  The 2 remaining screws were screws were filled with 10 mm screws and tourniquet let down the wound was irrigated with saline and mini C arm views reviewed and showing good position without penetration into the joint on oblique views of the distal radius.  The wound was then closed with 3-0 Vicryl subcutaneously and 4-0 nylon in a simple erupted fashion with Xeroform 4 x 4 web roll volar splint and Ace wrap applied  PLAN OF CARE: Discharge to home after PACU  PATIENT DISPOSITION:  PACU - hemodynamically stable.

## 2019-08-22 NOTE — H&P (Signed)
Reviewed paper H+P, will be scanned into chart. No changes noted.  

## 2019-08-23 ENCOUNTER — Encounter: Payer: Self-pay | Admitting: Orthopedic Surgery

## 2019-08-26 DIAGNOSIS — Z8781 Personal history of (healed) traumatic fracture: Secondary | ICD-10-CM | POA: Diagnosis not present

## 2019-08-26 DIAGNOSIS — Z9889 Other specified postprocedural states: Secondary | ICD-10-CM | POA: Diagnosis not present

## 2019-09-04 DIAGNOSIS — H04123 Dry eye syndrome of bilateral lacrimal glands: Secondary | ICD-10-CM | POA: Diagnosis not present

## 2019-09-05 DIAGNOSIS — S52552D Other extraarticular fracture of lower end of left radius, subsequent encounter for closed fracture with routine healing: Secondary | ICD-10-CM | POA: Diagnosis not present

## 2019-09-13 DIAGNOSIS — J449 Chronic obstructive pulmonary disease, unspecified: Secondary | ICD-10-CM | POA: Diagnosis not present

## 2019-09-27 DIAGNOSIS — S52552D Other extraarticular fracture of lower end of left radius, subsequent encounter for closed fracture with routine healing: Secondary | ICD-10-CM | POA: Diagnosis not present

## 2019-10-14 DIAGNOSIS — J449 Chronic obstructive pulmonary disease, unspecified: Secondary | ICD-10-CM | POA: Diagnosis not present

## 2019-10-21 ENCOUNTER — Telehealth: Payer: Self-pay | Admitting: *Deleted

## 2019-10-21 DIAGNOSIS — Z01812 Encounter for preprocedural laboratory examination: Secondary | ICD-10-CM | POA: Diagnosis not present

## 2019-10-21 DIAGNOSIS — Z87891 Personal history of nicotine dependence: Secondary | ICD-10-CM

## 2019-10-21 DIAGNOSIS — Z122 Encounter for screening for malignant neoplasm of respiratory organs: Secondary | ICD-10-CM

## 2019-10-21 NOTE — Telephone Encounter (Signed)
Patient has been notified that annual lung cancer screening low dose CT scan is due currently or will be in near future. Confirmed that patient is within the age range of 55-77, and asymptomatic, (no signs or symptoms of lung cancer). Patient denies illness that would prevent curative treatment for lung cancer if found. Verified smoking history, (current, 56.5 pack year). The shared decision making visit was done 10/18/17. Patient is agreeable for CT scan being scheduled.

## 2019-10-23 DIAGNOSIS — K228 Other specified diseases of esophagus: Secondary | ICD-10-CM | POA: Diagnosis not present

## 2019-10-23 DIAGNOSIS — K222 Esophageal obstruction: Secondary | ICD-10-CM | POA: Diagnosis not present

## 2019-10-28 ENCOUNTER — Other Ambulatory Visit: Payer: Self-pay

## 2019-10-28 ENCOUNTER — Ambulatory Visit
Admission: RE | Admit: 2019-10-28 | Discharge: 2019-10-28 | Disposition: A | Discharge status: Home / Self Care | Payer: Medicare Other | Source: Ambulatory Visit | Attending: Oncology | Admitting: Oncology

## 2019-10-28 DIAGNOSIS — Z87891 Personal history of nicotine dependence: Secondary | ICD-10-CM | POA: Diagnosis not present

## 2019-10-28 DIAGNOSIS — Z122 Encounter for screening for malignant neoplasm of respiratory organs: Secondary | ICD-10-CM | POA: Insufficient documentation

## 2019-10-29 ENCOUNTER — Ambulatory Visit: Admission: RE | Admit: 2019-10-29 | Payer: Medicare Other | Source: Ambulatory Visit

## 2019-10-31 ENCOUNTER — Encounter: Payer: Self-pay | Admitting: *Deleted

## 2019-10-31 DIAGNOSIS — R1314 Dysphagia, pharyngoesophageal phase: Secondary | ICD-10-CM | POA: Diagnosis not present

## 2019-10-31 DIAGNOSIS — K222 Esophageal obstruction: Secondary | ICD-10-CM | POA: Diagnosis not present

## 2019-10-31 DIAGNOSIS — K228 Other specified diseases of esophagus: Secondary | ICD-10-CM | POA: Diagnosis not present

## 2019-11-13 DIAGNOSIS — J449 Chronic obstructive pulmonary disease, unspecified: Secondary | ICD-10-CM | POA: Diagnosis not present

## 2019-12-14 DIAGNOSIS — J449 Chronic obstructive pulmonary disease, unspecified: Secondary | ICD-10-CM | POA: Diagnosis not present

## 2019-12-19 DIAGNOSIS — I442 Atrioventricular block, complete: Secondary | ICD-10-CM | POA: Diagnosis not present

## 2020-01-14 DIAGNOSIS — J449 Chronic obstructive pulmonary disease, unspecified: Secondary | ICD-10-CM | POA: Diagnosis not present

## 2020-01-22 ENCOUNTER — Other Ambulatory Visit: Payer: Self-pay | Admitting: Family Medicine

## 2020-01-22 ENCOUNTER — Encounter: Payer: Self-pay | Admitting: Family Medicine

## 2020-01-22 ENCOUNTER — Ambulatory Visit (INDEPENDENT_AMBULATORY_CARE_PROVIDER_SITE_OTHER): Payer: Medicare Other | Admitting: Family Medicine

## 2020-01-22 VITALS — BP 166/76 | HR 86 | Temp 98.7°F | Wt 145.0 lb

## 2020-01-22 DIAGNOSIS — J189 Pneumonia, unspecified organism: Secondary | ICD-10-CM | POA: Diagnosis not present

## 2020-01-22 DIAGNOSIS — R059 Cough, unspecified: Secondary | ICD-10-CM

## 2020-01-22 DIAGNOSIS — Z716 Tobacco abuse counseling: Secondary | ICD-10-CM | POA: Diagnosis not present

## 2020-01-22 DIAGNOSIS — J449 Chronic obstructive pulmonary disease, unspecified: Secondary | ICD-10-CM | POA: Diagnosis not present

## 2020-01-22 DIAGNOSIS — R05 Cough: Secondary | ICD-10-CM

## 2020-01-22 MED ORDER — DOXYCYCLINE HYCLATE 100 MG PO TABS: 100.0000 mg | ORAL_TABLET | Freq: Two times a day (BID) | ORAL | 0 refills | Status: DC

## 2020-01-22 NOTE — Progress Notes (Signed)
Patient: Crystal White Female    DOB: [birth year]   79 y.o.   MRN: [id number] Visit Date: 01/22/2020  Today's Provider: [name], MD   Chief Complaint  Patient presents with  . Back Pain   Subjective:    I [place] S. Maisy Newport, CMA, am acting as scribe for [name], MD. Virtual Visit via Telephone Note  I connected with [name] on 01/22/20 at  1:20 PM EST by telephone and verified that I am speaking with the correct person using two identifiers.  Location: Patient: Home Provider: Office   I discussed the limitations, risks, security and privacy concerns of performing an evaluation and management service by telephone and the availability of in person appointments. I also discussed with the patient that there may be a patient responsible charge related to this service. The patient expressed understanding and agreed to proceed.   Back Pain This is a new problem. The current episode started in the past 7 days. The problem occurs constantly. The problem has been gradually worsening since onset. The pain is present in the thoracic spine. The quality of the pain is described as stabbing. Radiates to: left side. The pain is at a severity of 10/10. The pain is severe. The pain is the same all the time. The symptoms are aggravated by coughing, lying down and position. Pertinent negatives include no abdominal pain, chest pain or fever. Risk factors: history of COPD. She has tried analgesics (Norco) for the symptoms. The treatment provided moderate relief.  Patient continues to smoke.  In the past week she has developed a cough and now has left lower lateral rib pain with the coughing.  No pain with breathing or otherwise.  She states the cough is now productive of green sputum.  No fever chills myalgias or shortness of breath.  Allergies  Allergen Reactions  . Iodinated Diagnostic Agents Other (See Comments)    Burning sensation during contrast media injections.  Patient states the IV contrast makes her very hot. She has had multiple CT Scans with IV contrast and states she only gets the warm sensation. This is not an allergic reaction, but is noted in her [facility] chart as well. Aggie Hacker, ARRT R CT, had this discussion with her on 01/10/19 at 1130am.  . Statins Other (See Comments)    Reaction:  Leg cramps   . Bacitracin Swelling and Rash     Current Outpatient Medications:  .  acetaminophen (TYLENOL) 500 MG tablet, Take 1,000 mg by mouth every 6 (six) hours as needed (pain/headaches.). , Disp: , Rfl:  .  albuterol (PROVENTIL HFA) 108 (90 Base) MCG/ACT inhaler, Inhale 2 puffs into the lungs every 6 (six) hours as needed for wheezing or shortness of breath., Disp: 3 each, Rfl: 3 .  BREO ELLIPTA 100-25 MCG/INH AEPB, USE 1 INHALATION DAILY (Patient taking differently: Inhale 1 puff into the lungs daily. ), Disp: 180 each, Rfl: 3 .  citalopram (CELEXA) 20 MG tablet, Take 1 tablet (20 mg total) by mouth daily. (Patient taking differently: Take 20 mg by mouth at bedtime. ), Disp: 30 tablet, Rfl: 11 .  ezetimibe (ZETIA) 10 MG tablet, TAKE 1 TABLET BY MOUTH  DAILY (Patient taking differently: Take 10 mg by mouth daily. ), Disp: 90 tablet, Rfl: 3 .  gabapentin (NEURONTIN) 100 MG capsule, TAKE 2 CAPSULES BY MOUTH 3  TIMES DAILY (Patient taking differently: Take 200 mg by mouth 3 (three) times daily. ),  Disp: 540 capsule, Rfl: 3 .  hydrochlorothiazide (HYDRODIURIL) 25 MG tablet, TAKE 1 TABLET BY MOUTH  DAILY (Patient taking differently: Take 25 mg by mouth daily. ), Disp: 90 tablet, Rfl: 3 .  HYDROcodone-acetaminophen (NORCO) 5-325 MG tablet, Take 1 tablet by mouth every 6 (six) hours as needed., Disp: 20 tablet, Rfl: 0 .  loperamide (IMODIUM A-D) 2 MG tablet, Take 2 mg by mouth at bedtime., Disp: , Rfl:  .  losartan (COZAAR) 50 MG tablet, TAKE 1 TABLET BY MOUTH AT  BEDTIME (Patient taking differently: Take 50 mg by mouth at bedtime. ), Disp: 90 tablet, Rfl:  3 .  meclizine (ANTIVERT) 25 MG tablet, Take 1 tablet (25 mg total) by mouth 2 (two) times daily as needed for dizziness., Disp: 180 tablet, Rfl: 3 .  mirtazapine (REMERON) 30 MG tablet, TAKE 1 TABLET BY MOUTH AT  BEDTIME (Patient taking differently: Take 30 mg by mouth at bedtime. ), Disp: 90 tablet, Rfl: 3 .  OXYGEN, Inhale into the lungs daily. Uses at night with CPAP and PRN during daytime, Disp: , Rfl:  .  pantoprazole (PROTONIX) 40 MG tablet, TAKE 1 TABLET BY MOUTH TWO  TIMES DAILY (Patient taking differently: Take 40 mg by mouth 2 (two) times daily. ), Disp: 180 tablet, Rfl: 3 .  traZODone (DESYREL) 150 MG tablet, TAKE 1 TABLET BY MOUTH AT  BEDTIME (Patient taking differently: Take 150 mg by mouth at bedtime. ), Disp: 90 tablet, Rfl: 3 .  hyoscyamine (LEVBID) 0.375 MG 12 hr tablet, Take 1 tablet (0.375 mg total) by mouth 2 (two) times daily as needed for cramping. Reported on 05/03/2016 (Patient not taking: Reported on 01/22/2020), Disp: 180 tablet, Rfl: 3  Review of Systems  Constitutional: Negative for chills, diaphoresis, fatigue and fever.  HENT: Positive for congestion.   Respiratory: Positive for cough, shortness of breath and wheezing.   Cardiovascular: Negative for chest pain and palpitations.  Gastrointestinal: Negative for abdominal pain, diarrhea, nausea and vomiting.  Musculoskeletal: Positive for back pain and myalgias.    Social History   Tobacco Use  . Smoking status: Current Every Day Smoker    Packs/day: 1.00    Years: 55.00    Pack years: 55.00    Types: Cigarettes  . Smokeless tobacco: Never Used  Substance Use Topics  . Alcohol use: Yes    Alcohol/week: 0.0 standard drinks    Comment: 1/2 glass wine -rare      Objective:   BP (!) 166/76 (BP Location: Left Arm, Patient Position: Sitting, Cuff Size: Normal)   Pulse 86   Temp 98.7 F (37.1 C) (Oral)   Wt 145 lb (65.8 kg)   BMI 24.50 kg/m  Vitals:   01/22/20 1142  BP: (!) 166/76  Pulse: 86  Temp: 98.7  F (37.1 C)  TempSrc: Oral  Weight: 145 lb (65.8 kg)  Body mass index is 24.5 kg/m.   Physical Exam   No results found for any visits on 01/22/20.     Assessment & Plan    1. Pneumonia of left lung due to infectious organism, unspecified part of lung - doxycycline (VIBRA-TABS) 100 MG tablet; Take 1 tablet (100 mg total) by mouth 2 (two) times daily.  Dispense: 14 tablet; Refill: 0 - Novel Coronavirus, NAA ([facility])  2. Cough - DG Chest 2 View; Future - Novel Coronavirus, NAA ([facility])  3. Chronic obstructive pulmonary disease, unspecified COPD type ([place]) - DG Chest 2 View; Future  4. Encounter for smoking cessation counseling  Patient again advised to quit smoking completely.   I discussed the assessment and treatment plan with the patient. The patient was provided an opportunity to ask questions and all were answered. The patient agreed with the plan and demonstrated an understanding of the instructions.   The patient was advised to call back or seek an in-person evaluation if the symptoms worsen or if the condition fails to improve as anticipated.  I provided 15 minutes of non-face-to-face time during this encounter.     Richard Cranford Mon, MD  [facility]

## 2020-01-23 ENCOUNTER — Ambulatory Visit
Admission: RE | Admit: 2020-01-23 | Discharge: 2020-01-23 | Disposition: A | Discharge status: Home / Self Care | Payer: Medicare Other | Attending: Family Medicine | Admitting: Family Medicine

## 2020-01-23 ENCOUNTER — Ambulatory Visit: Payer: Medicare Other

## 2020-01-23 ENCOUNTER — Ambulatory Visit
Admission: RE | Admit: 2020-01-23 | Discharge: 2020-01-23 | Disposition: A | Discharge status: Home / Self Care | Payer: Medicare Other | Source: Ambulatory Visit | Attending: Family Medicine | Admitting: Family Medicine

## 2020-01-23 ENCOUNTER — Other Ambulatory Visit: Payer: Self-pay

## 2020-01-23 DIAGNOSIS — Z20822 Contact with and (suspected) exposure to covid-19: Secondary | ICD-10-CM | POA: Insufficient documentation

## 2020-01-23 DIAGNOSIS — J449 Chronic obstructive pulmonary disease, unspecified: Secondary | ICD-10-CM | POA: Diagnosis not present

## 2020-01-23 DIAGNOSIS — R059 Cough, unspecified: Secondary | ICD-10-CM

## 2020-01-23 DIAGNOSIS — R05 Cough: Secondary | ICD-10-CM | POA: Insufficient documentation

## 2020-01-24 LAB — SPECIMEN STATUS REPORT

## 2020-01-24 LAB — NOVEL CORONAVIRUS, NAA: SARS-CoV-2, NAA: NOT DETECTED

## 2020-02-03 ENCOUNTER — Telehealth: Payer: Self-pay

## 2020-02-03 NOTE — Telephone Encounter (Signed)
Via fax we received FMLA forms to be completed in regards to pt on behalf of pt's daughter Chauncy Passy. [place]. Forms were placed in Dr. Alben Spittle box. Please advise. Thanks TNP

## 2020-02-03 NOTE — Telephone Encounter (Signed)
Copied from [place] [id number]. Topic: General - Other >> Feb 03, 2020 10:31 AM Celene Kras wrote: Reason for CRM: Pt called stating that her daughter is going to have FMLA paperwork faxed over to PCP. Please advise.

## 2020-02-03 NOTE — Telephone Encounter (Signed)
Received fax.

## 2020-02-05 NOTE — Telephone Encounter (Signed)
Pt called requesting a call back when her forms are completed please advise. [phone]

## 2020-02-07 NOTE — Telephone Encounter (Signed)
Pt called in to check on Daughters FMLA paperwork.  She stated that she has to have it turned in by the 18th.   Best number  [phone]

## 2020-02-07 NOTE — Telephone Encounter (Signed)
Returned call to pt. Advised per Dr. Rosanna Randy, he will discuss FMLA with pt at next follow up.

## 2020-02-11 DIAGNOSIS — J449 Chronic obstructive pulmonary disease, unspecified: Secondary | ICD-10-CM | POA: Diagnosis not present

## 2020-02-13 ENCOUNTER — Other Ambulatory Visit: Payer: Self-pay

## 2020-02-13 ENCOUNTER — Ambulatory Visit (INDEPENDENT_AMBULATORY_CARE_PROVIDER_SITE_OTHER): Payer: Medicare Other | Admitting: Family Medicine

## 2020-02-13 ENCOUNTER — Encounter: Payer: Self-pay | Admitting: Family Medicine

## 2020-02-13 VITALS — BP 159/85 | HR 90 | Temp 97.1°F | Resp 18 | Ht 64.0 in | Wt 152.0 lb

## 2020-02-13 DIAGNOSIS — I10 Essential (primary) hypertension: Secondary | ICD-10-CM | POA: Diagnosis not present

## 2020-02-13 DIAGNOSIS — K2100 Gastro-esophageal reflux disease with esophagitis, without bleeding: Secondary | ICD-10-CM | POA: Diagnosis not present

## 2020-02-13 DIAGNOSIS — J449 Chronic obstructive pulmonary disease, unspecified: Secondary | ICD-10-CM | POA: Diagnosis not present

## 2020-02-13 DIAGNOSIS — K22719 Barrett's esophagus with dysplasia, unspecified: Secondary | ICD-10-CM

## 2020-02-13 DIAGNOSIS — Z9889 Other specified postprocedural states: Secondary | ICD-10-CM

## 2020-02-13 DIAGNOSIS — R296 Repeated falls: Secondary | ICD-10-CM

## 2020-02-13 DIAGNOSIS — Z720 Tobacco use: Secondary | ICD-10-CM

## 2020-02-13 NOTE — Progress Notes (Signed)
Patient: Crystal White Female    DOB: 1941/07/17   79 y.o.   MRN: [id number] Visit Date: 02/13/2020  Today's Provider: [name], MD   No chief complaint on file.  Subjective:     HPI  Patient is doing well.  She no longer drives but her daughter takes her to her appointments.  Her pulmonologist, Dr. Loreen Freud and has left the practice and we need to schedule her for a new pulmonary physician.  Dr. Gustavo Lah who is taking care of her chronic GERD has retired.  She continues to smoke and knows she needs to quit. She has had mild falls recently when she tripped over things. Anxiety From 03/26/2019-Slightly worse. Increased Citalopram from 10 to 20mg  daily.  Major depressive disorder with single episode, in partial remission ([place] [facility]) From 03/26/2019-Increase Remeron from 30 to 45 mg daily.  Essential (primary) hypertension From 03/26/2019-no changes were made.  Allergies  Allergen Reactions  . Iodinated Diagnostic Agents Other (See Comments)    Burning sensation during contrast media injections. Patient states the IV contrast makes her very hot. She has had multiple CT Scans with IV contrast and states she only gets the warm sensation. This is not an allergic reaction, but is noted in her [place] chart as well. Aggie Hacker, ARRT R CT, had this discussion with her on 01/10/19 at 1130am.  . Statins Other (See Comments)    Reaction:  Leg cramps   . Bacitracin Swelling and Rash     Current Outpatient Medications:  .  acetaminophen (TYLENOL) 500 MG tablet, Take 1,000 mg by mouth every 6 (six) hours as needed (pain/headaches.). , Disp: , Rfl:  .  albuterol (PROVENTIL HFA) 108 (90 Base) MCG/ACT inhaler, Inhale 2 puffs into the lungs every 6 (six) hours as needed for wheezing or shortness of breath., Disp: 3 each, Rfl: 3 .  BREO ELLIPTA 100-25 MCG/INH AEPB, USE 1 INHALATION BY MOUTH  DAILY, Disp: 180 each, Rfl: 3 .  citalopram (CELEXA) 20 MG tablet, Take 1 tablet (20  mg total) by mouth daily. (Patient taking differently: Take 20 mg by mouth at bedtime. ), Disp: 30 tablet, Rfl: 11 .  doxycycline (VIBRA-TABS) 100 MG tablet, Take 1 tablet (100 mg total) by mouth 2 (two) times daily., Disp: 14 tablet, Rfl: 0 .  ezetimibe (ZETIA) 10 MG tablet, TAKE 1 TABLET BY MOUTH  DAILY (Patient taking differently: Take 10 mg by mouth daily. ), Disp: 90 tablet, Rfl: 3 .  gabapentin (NEURONTIN) 100 MG capsule, TAKE 2 CAPSULES BY MOUTH 3  TIMES DAILY (Patient taking differently: Take 200 mg by mouth 3 (three) times daily. ), Disp: 540 capsule, Rfl: 3 .  hydrochlorothiazide (HYDRODIURIL) 25 MG tablet, TAKE 1 TABLET BY MOUTH  DAILY (Patient taking differently: Take 25 mg by mouth daily. ), Disp: 90 tablet, Rfl: 3 .  HYDROcodone-acetaminophen (NORCO) 5-325 MG tablet, Take 1 tablet by mouth every 6 (six) hours as needed., Disp: 20 tablet, Rfl: 0 .  hyoscyamine (LEVBID) 0.375 MG 12 hr tablet, Take 1 tablet (0.375 mg total) by mouth 2 (two) times daily as needed for cramping. Reported on 05/03/2016 (Patient not taking: Reported on 01/22/2020), Disp: 180 tablet, Rfl: 3 .  loperamide (IMODIUM A-D) 2 MG tablet, Take 2 mg by mouth at bedtime., Disp: , Rfl:  .  losartan (COZAAR) 50 MG tablet, TAKE 1 TABLET BY MOUTH AT  BEDTIME (Patient taking differently: Take 50 mg by mouth at bedtime. ), Disp:  90 tablet, Rfl: 3 .  meclizine (ANTIVERT) 25 MG tablet, Take 1 tablet (25 mg total) by mouth 2 (two) times daily as needed for dizziness., Disp: 180 tablet, Rfl: 3 .  mirtazapine (REMERON) 30 MG tablet, TAKE 1 TABLET BY MOUTH AT  BEDTIME (Patient taking differently: Take 30 mg by mouth at bedtime. ), Disp: 90 tablet, Rfl: 3 .  OXYGEN, Inhale into the lungs daily. Uses at night with CPAP and PRN during daytime, Disp: , Rfl:  .  pantoprazole (PROTONIX) 40 MG tablet, TAKE 1 TABLET BY MOUTH TWO  TIMES DAILY (Patient taking differently: Take 40 mg by mouth 2 (two) times daily. ), Disp: 180 tablet, Rfl: 3 .   traZODone (DESYREL) 150 MG tablet, TAKE 1 TABLET BY MOUTH AT  BEDTIME (Patient taking differently: Take 150 mg by mouth at bedtime. ), Disp: 90 tablet, Rfl: 3  Review of Systems  Constitutional: Negative for appetite change, chills, fatigue and fever.  HENT: Negative.   Eyes: Negative.   Respiratory: Negative for chest tightness and shortness of breath.   Cardiovascular: Negative for chest pain and palpitations.  Gastrointestinal: Negative for abdominal pain, nausea and vomiting.       Chronic reflux.  Endocrine: Negative.   Musculoskeletal: Negative.   Allergic/Immunologic: Negative.   Neurological: Negative for dizziness and weakness.  Hematological: Negative.   Psychiatric/Behavioral: Negative.     Social History   Tobacco Use  . Smoking status: Current Every Day Smoker    Packs/day: 1.00    Years: 55.00    Pack years: 55.00    Types: Cigarettes  . Smokeless tobacco: Never Used  Substance Use Topics  . Alcohol use: Yes    Alcohol/week: 0.0 standard drinks    Comment: 1/2 glass wine -rare      Objective:   There were no vitals taken for this visit. There were no vitals filed for this visit.There is no height or weight on file to calculate BMI.   Physical Exam Vitals reviewed.  Constitutional:      Appearance: She is well-developed.  HENT:     Head: Normocephalic and atraumatic.     Right Ear: External ear normal.     Left Ear: External ear normal.     Nose: Nose normal.  Eyes:     General: No scleral icterus.    Conjunctiva/sclera: Conjunctivae normal.  Neck:     Thyroid: No thyromegaly.  Cardiovascular:     Rate and Rhythm: Normal rate and regular rhythm.     Heart sounds: Normal heart sounds.  Pulmonary:     Effort: Pulmonary effort is normal.     Breath sounds: Normal breath sounds.  Abdominal:     Palpations: Abdomen is soft.  Skin:    General: Skin is warm and dry.  Neurological:     Mental Status: She is alert and oriented to person, place, and  time.  Psychiatric:        Mood and Affect: Mood normal.        Behavior: Behavior normal.        Thought Content: Thought content normal.        Judgment: Judgment normal.      No results found for any visits on 02/13/20.     Assessment & Plan    1. Chronic obstructive pulmonary disease, unspecified COPD type ([place]) Patient strongly advised to quit smoking.  Need to refer her back to another pulmonologist.  Erik Obey is no longer practicing. - CBC w/Diff/Platelet -  Comprehensive Metabolic Panel (CMET) - TSH - Ambulatory referral to Pulmonology  2. Essential (primary) hypertension May need to increase losartan on next recheck - CBC w/Diff/Platelet - Comprehensive Metabolic Panel (CMET) - TSH  3. Falls frequently Advised patient to use cane.  Consider PT referral - CBC w/Diff/Platelet - Comprehensive Metabolic Panel (CMET) - TSH  4. Gastroesophageal reflux disease with esophagitis, unspecified whether hemorrhage Chronic GERD with Barrett's esophagus.  At some point refer back to GI for follow-up.More than 50% 25 minute visit spent in counseling or coordination of care  - CBC w/Diff/Platelet - Comprehensive Metabolic Panel (CMET) - TSH  5. Tobacco abuse Patient strongly advised to quit smoking.  6. Barrett's esophagus with dysplasia Per GI  7. History of Nissen fundoplication    Follow up in 6 months for CPE.    I,Clementina Mareno,acting as a scribe for [name], MD.,have documented all relevant documentation on the behalf of [name], MD,as directed by  [name], MD while in the presence of [name], MD.      [name], MD  [facility]

## 2020-02-14 LAB — COMPREHENSIVE METABOLIC PANEL
ALT: 7 IU/L (ref 0–32)
AST: 13 IU/L (ref 0–40)
Albumin/Globulin Ratio: 1.6 (ref 1.2–2.2)
Albumin: 4.1 g/dL (ref 3.7–4.7)
Alkaline Phosphatase: 95 IU/L (ref 39–117)
BUN/Creatinine Ratio: 11 — ABNORMAL LOW (ref 12–28)
BUN: 14 mg/dL (ref 8–27)
Bilirubin Total: 0.3 mg/dL (ref 0.0–1.2)
CO2: 25 mmol/L (ref 20–29)
Calcium: 9 mg/dL (ref 8.7–10.3)
Chloride: 103 mmol/L (ref 96–106)
Creatinine, Ser: 1.32 mg/dL — ABNORMAL HIGH (ref 0.57–1.00)
GFR calc Af Amer: 45 mL/min/{1.73_m2} — ABNORMAL LOW (ref >59)
GFR calc non Af Amer: 39 mL/min/{1.73_m2} — ABNORMAL LOW (ref >59)
Globulin, Total: 2.5 g/dL (ref 1.5–4.5)
Glucose: 88 mg/dL (ref 65–99)
Potassium: 4.9 mmol/L (ref 3.5–5.2)
Sodium: 141 mmol/L (ref 134–144)
Total Protein: 6.6 g/dL (ref 6.0–8.5)

## 2020-02-14 LAB — CBC WITH DIFFERENTIAL/PLATELET
Basophils Absolute: 0.1 10*3/uL (ref 0.0–0.2)
Basos: 1 %
EOS (ABSOLUTE): 0.2 10*3/uL (ref 0.0–0.4)
Eos: 2 %
Hematocrit: 44.7 % (ref 34.0–46.6)
Hemoglobin: 15.2 g/dL (ref 11.1–15.9)
Immature Grans (Abs): 0 10*3/uL (ref 0.0–0.1)
Immature Granulocytes: 0 %
Lymphocytes Absolute: 1.4 10*3/uL (ref 0.7–3.1)
Lymphs: 15 %
MCH: 31.5 pg (ref 26.6–33.0)
MCHC: 34 g/dL (ref 31.5–35.7)
MCV: 93 fL (ref 79–97)
Monocytes Absolute: 0.6 10*3/uL (ref 0.1–0.9)
Monocytes: 7 %
Neutrophils Absolute: 7.1 10*3/uL — ABNORMAL HIGH (ref 1.4–7.0)
Neutrophils: 75 %
Platelets: 188 10*3/uL (ref 150–450)
RBC: 4.83 x10E6/uL (ref 3.77–5.28)
RDW: 13.7 % (ref 11.7–15.4)
WBC: 9.4 10*3/uL (ref 3.4–10.8)

## 2020-02-14 LAB — TSH: TSH: 1.58 u[IU]/mL (ref 0.450–4.500)

## 2020-02-17 ENCOUNTER — Telehealth: Payer: Self-pay

## 2020-02-17 NOTE — Telephone Encounter (Signed)
-----   Message from Jerrol Banana., MD sent at 02/17/2020  8:17 AM EDT ----- Labs stable, to protect kidneys avoid anti-inflammatory drugs and drink a little more water during the day.

## 2020-02-17 NOTE — Telephone Encounter (Signed)
Patient advised.

## 2020-02-18 ENCOUNTER — Ambulatory Visit: Payer: Medicare Other | Admitting: Pulmonary Disease

## 2020-02-18 NOTE — Telephone Encounter (Signed)
Spoke with pt and advised forms have been completed & placed up front to be picked up. Pt voiced understanding. Copy of forms were obtained to be scanned in pt's chart. Thanks TNP

## 2020-02-25 ENCOUNTER — Telehealth: Payer: Self-pay

## 2020-02-25 NOTE — Telephone Encounter (Signed)
Copied from [place] [id number]. Topic: General - Other >> Feb 25, 2020  1:27 PM Yvette Rack wrote: Reason for CRM: Pt requests that her FMLA paperwork be faxed to [phone]

## 2020-02-25 NOTE — Telephone Encounter (Signed)
FMLA form was faxed last week. We refaxed form again today.

## 2020-03-11 ENCOUNTER — Telehealth: Payer: Self-pay

## 2020-03-11 NOTE — Telephone Encounter (Signed)
Patient called wanting her daughter's FMLA paperwork faxed to her job. I explained to her that we can't fax it because it is a violation of HIPAA and it has to be picked up in the office.

## 2020-03-11 NOTE — Telephone Encounter (Signed)
Copied from [place] [id number]. Topic: General - Inquiry >> Mar 11, 2020 11:30 AM Alease Frame wrote: Reason for CRM: Patient is needing a call back from [place] . Pt wouldn't state why she needed phone call back

## 2020-03-13 DIAGNOSIS — J449 Chronic obstructive pulmonary disease, unspecified: Secondary | ICD-10-CM | POA: Diagnosis not present

## 2020-04-12 ENCOUNTER — Other Ambulatory Visit: Payer: Self-pay | Admitting: Family Medicine

## 2020-04-12 DIAGNOSIS — J449 Chronic obstructive pulmonary disease, unspecified: Secondary | ICD-10-CM | POA: Diagnosis not present

## 2020-04-13 ENCOUNTER — Ambulatory Visit: Payer: Self-pay | Admitting: *Deleted

## 2020-04-13 NOTE — Telephone Encounter (Signed)
Charted in error.

## 2020-04-13 NOTE — Addendum Note (Signed)
Addended by: Addison Naegeli on: 04/13/2020 07:10 AM   Modules accepted: Level of Service, SmartSet

## 2020-04-13 NOTE — Telephone Encounter (Signed)
This encounter was created in error - please disregard.

## 2020-04-23 NOTE — Progress Notes (Signed)
Subjective:   Crystal White is a 79 y.o. female who presents for Medicare Annual (Subsequent) preventive examination.  I connected with Fredirick Maudlin today by telephone and verified that I am speaking with the correct person using two identifiers. Location patient: home Location provider: work Persons participating in the virtual visit: patient, provider.   I discussed the limitations, risks, security and privacy concerns of performing an evaluation and management service by telephone and the availability of in person appointments. I also discussed with the patient that there may be a patient responsible charge related to this service. The patient expressed understanding and verbally consented to this telephonic visit.    Interactive audio and video telecommunications were attempted between this provider and patient, however failed, due to patient having technical difficulties OR patient did not have access to video capability.  We continued and completed visit with audio only.   Review of Systems:  N/A  Cardiac Risk Factors include: advanced age (>41men, >76 women);dyslipidemia;hypertension     Objective:     Vitals: There were no vitals taken for this visit.  There is no height or weight on file to calculate BMI.  Advanced Directives 04/28/2020 08/22/2019 08/19/2019 06/11/2019 04/23/2019 01/24/2019 04/17/2018  Does Patient Have a Medical Advance Directive? Yes Yes No Yes Yes Yes Yes  Type of Paramedic of [place];Living will [place];Living will - - [place];Living will Living will [place];Living will  Does patient want to make changes to medical advance directive? - No - Guardian declined - - - - -  Copy of [place] in Chart? No - copy requested - - - No - copy requested - No - copy requested  Would patient like information on creating a medical advance directive? - No - Patient  declined - - - - -    Tobacco Social History   Tobacco Use  Smoking Status Current Every Day Smoker  . Packs/day: 1.00  . Years: 55.00  . Pack years: 55.00  . Types: Cigarettes  Smokeless Tobacco Never Used     Ready to quit: No Counseling given: No   Clinical Intake:  Pre-visit preparation completed: Yes  Pain : 0-10 Pain Score: 8 (when moving) Pain Type: Acute pain Pain Location: Rib cage Pain Orientation: Left Pain Descriptors / Indicators: Aching Pain Onset: 1 to 4 weeks ago Pain Frequency: Intermittent     Nutritional Risks: None Diabetes: No  How often do you need to have someone help you when you read instructions, pamphlets, or other written materials from your doctor or pharmacy?: 1 - Never  Interpreter Needed?: No  Information entered by :: [facility], LPN  Past Medical History:  Diagnosis Date  . Arthritis    hands, lower back  . Barrett's esophagus   . COPD (chronic obstructive pulmonary disease) ([place])   . Emphysema lung ([place])   . GERD (gastroesophageal reflux disease)   . Hernia of abdominal cavity   . Hyperlipidemia   . Hypertension   . Mobitz type II atrioventricular block   . OSA (obstructive sleep apnea)    on CPAP  . Peripheral neuropathy   . Presence of permanent cardiac pacemaker 09/22/11   Biotronik - EVIADR-T [facility])  . Shortness of breath dyspnea   . Stomach ulcer    Past Surgical History:  Procedure Laterality Date  . APPENDECTOMY  2006   Dr. Pat Patrick  . BROW LIFT Bilateral 05/03/2016   Procedure:  BLEPHAROPLASTY  BILATERAL UPPER EYELIDS WITH EXCESS SKIN REMOVAL BILATERAL BROW PTOSIS REPAIR BLEPHAROPTOSIS REPAIR BILATERAL EYES ;  Surgeon: Karle Starch, MD;  Location: [place];  Service: Ophthalmology;  Laterality: Bilateral;  . CARDIAC CATHETERIZATION  2007  . COLON SURGERY    . COLONOSCOPY WITH PROPOFOL N/A 04/14/2015   Procedure: COLONOSCOPY WITH PROPOFOL;  Surgeon: Lollie Sails, MD;  [facility] END[facility]COPY;   Service: Endoscopy;  Laterality: N/A;  . COLONOSCOPY WITH PROPOFOL N/A 01/24/2019   Procedure: COLONOSCOPY WITH PROPOFOL;  Surgeon: Lollie Sails, MD;  Location: [facility] ENDOSCOPY;  Service: Endoscopy;  Laterality: N/A;  . COLONOSCOPY WITH PROPOFOL N/A 06/11/2019   Procedure: COLONOSCOPY WITH PROPOFOL;  Surgeon: Lollie Sails, MD;  Location: [facility] ENDOSCOPY;  Service: Endoscopy;  Laterality: N/A;  . ESOPHAGOGASTRODUODENOSCOPY N/A 04/14/2015   Procedure: ESOPHAGOGASTRODUODENOSCOPY (EGD);  Surgeon: Lollie Sails, MD;  Location: [facility] ENDOSCOPY;  Service: Endoscopy;  Laterality: N/A;  . ESOPHAGOGASTRODUODENOSCOPY (EGD) WITH PROPOFOL N/A 07/05/2017   Procedure: ESOPHAGOGASTRODUODENOSCOPY (EGD) WITH PROPOFOL;  Surgeon: Manya Silvas, MD;  Location: [facility] ENDOSCOPY;  Service: Endoscopy;  Laterality: N/A;  . ESOPHAGOGASTRODUODENOSCOPY (EGD) WITH PROPOFOL N/A 01/24/2019   Procedure: ESOPHAGOGASTRODUODENOSCOPY (EGD) WITH PROPOFOL;  Surgeon: Lollie Sails, MD;  Location: [facility] ENDOSCOPY;  Service: Endoscopy;  Laterality: N/A;  . HERNIA REPAIR    . HIATAL HERNIA REPAIR  2007  . INSERT / REPLACE / REMOVE PACEMAKER    . LAPAROSCOPIC RIGHT HEMI COLECTOMY Right 06/22/2015   Procedure: LAPAROSCOPIC RIGHT HEMI COLECTOMY;  Surgeon: Marlyce Huge, MD;  Location: [facility] ORS;  Service: General;  Laterality: Right;  . NISSEN FUNDOPLICATION  [id number]  . OPEN REDUCTION INTERNAL FIXATION (ORIF) DISTAL RADIAL FRACTURE Left 08/22/2019   Procedure: OPEN REDUCTION INTERNAL FIXATION (ORIF) DISTAL RADIAL FRACTURE;  Surgeon: Hessie Knows, MD;  Location: [facility] ORS;  Service: Orthopedics;  Laterality: Left;  . PACEMAKER INSERTION  09/22/11   [facility]  - Biotronik EVIADR-T  . TOTAL ABDOMINAL HYSTERECTOMY  1980   Family History  Adopted: Yes  Problem Relation Age of Onset  . Diabetes Son    Social History   Socioeconomic History  . Marital status: Widowed    Spouse name: widowed  . Number of children: 2  . Years  of education: Not on file  . Highest education level: Some college, no degree  Occupational History  . Occupation: retired Personal assistant  Tobacco Use  . Smoking status: Current Every Day Smoker    Packs/day: 1.00    Years: 55.00    Pack years: 55.00    Types: Cigarettes  . Smokeless tobacco: Never Used  Substance and Sexual Activity  . Alcohol use: Yes    Alcohol/week: 0.0 standard drinks    Comment: 1/2 glass wine -rare  . Drug use: No  . Sexual activity: Never  Other Topics Concern  . Not on file  Social History Narrative   Pt was adopted.    Lives at home by herself.   Not on home oxygen.   Social Determinants of Health   Financial Resource Strain: Low Risk   . Difficulty of Paying Living Expenses: Not hard at all  Food Insecurity: No Food Insecurity  . Worried About Charity fundraiser in the Last Year: Never true  . Ran Out of Food in the Last Year: Never true  Transportation Needs: No Transportation Needs  . Lack of Transportation (Medical): No  . Lack of Transportation (Non-Medical): No  Physical Activity: Inactive  . Days of  Exercise per Week: 0 days  . Minutes of Exercise per Session: 0 min  Stress: No Stress Concern Present  . Feeling of Stress : Not at all  Social Connections: Moderately Isolated  . Frequency of Communication with Friends and Family: More than three times a week  . Frequency of Social Gatherings with Friends and Family: More than three times a week  . Attends Religious Services: Never  . Active Member of Clubs or Organizations: No  . Attends Archivist Meetings: Never  . Marital Status: Widowed    Outpatient Encounter Medications as of 04/28/2020  Medication Sig  . acetaminophen (TYLENOL) 500 MG tablet Take 1,000 mg by mouth every 6 (six) hours as needed (pain/headaches.).   Marland Kitchen albuterol (PROVENTIL HFA) 108 (90 Base) MCG/ACT inhaler Inhale 2 puffs into the lungs every 6 (six) hours as needed for wheezing or shortness of breath.  Marland Kitchen  BREO ELLIPTA 100-25 MCG/INH AEPB USE 1 INHALATION BY MOUTH  DAILY  . citalopram (CELEXA) 20 MG tablet Take 1 tablet (20 mg total) by mouth daily. (Patient taking differently: Take 20 mg by mouth at bedtime. )  . ezetimibe (ZETIA) 10 MG tablet TAKE 1 TABLET BY MOUTH  DAILY (Patient taking differently: Take 10 mg by mouth daily. )  . gabapentin (NEURONTIN) 100 MG capsule TAKE 2 CAPSULES BY MOUTH 3  TIMES DAILY (Patient taking differently: Take 200 mg by mouth 3 (three) times daily. )  . hydrochlorothiazide (HYDRODIURIL) 25 MG tablet TAKE 1 TABLET BY MOUTH  DAILY (Patient taking differently: Take 25 mg by mouth daily. )  . loperamide (IMODIUM A-D) 2 MG tablet Take 2 mg by mouth at bedtime.  Marland Kitchen losartan (COZAAR) 50 MG tablet TAKE 1 TABLET BY MOUTH AT  BEDTIME (Patient taking differently: Take 50 mg by mouth at bedtime. )  . meclizine (ANTIVERT) 25 MG tablet Take 1 tablet (25 mg total) by mouth 2 (two) times daily as needed for dizziness.  . mirtazapine (REMERON) 30 MG tablet TAKE 1 TABLET BY MOUTH AT  BEDTIME (Patient taking differently: Take 30 mg by mouth at bedtime. )  . OXYGEN Inhale into the lungs daily. Uses at night with CPAP and PRN during daytime  . pantoprazole (PROTONIX) 40 MG tablet TAKE 1 TABLET BY MOUTH TWO  TIMES DAILY (Patient taking differently: Take 40 mg by mouth 2 (two) times daily. )  . traZODone (DESYREL) 150 MG tablet TAKE 1 TABLET BY MOUTH AT  BEDTIME (Patient taking differently: Take 150 mg by mouth at bedtime. )  . doxycycline (VIBRA-TABS) 100 MG tablet Take 1 tablet (100 mg total) by mouth 2 (two) times daily. (Patient not taking: Reported on 04/28/2020)  . HYDROcodone-acetaminophen (NORCO) 5-325 MG tablet Take 1 tablet by mouth every 6 (six) hours as needed. (Patient not taking: Reported on 02/13/2020)  . hyoscyamine (LEVBID) 0.375 MG 12 hr tablet Take 1 tablet (0.375 mg total) by mouth 2 (two) times daily as needed for cramping. Reported on 05/03/2016 (Patient not taking: Reported on  01/22/2020)   No facility-administered encounter medications on file as of 04/28/2020.    Activities of Daily Living In your present state of health, do you have any difficulty performing the following activities: 04/28/2020  Hearing? N  Vision? N  Difficulty concentrating or making decisions? N  Walking or climbing stairs? Y  Comment Due to balance issues.  Dressing or bathing? N  Doing errands, shopping? N  Preparing Food and eating ? N  Using the Toilet? N  In the past six months, have you accidently leaked urine? Y  Comment Wears protection at all times.  Do you have problems with loss of bowel control? N  Managing your Medications? N  Managing your Finances? N  Housekeeping or managing your Housekeeping? N  Some recent data might be hidden    Patient Care Team: Jerrol Banana., MD as PCP - General (Unknown Physician Specialty) Samara Deist, DPM as Referring Physician (Podiatry) Isaias Cowman, MD as Consulting Physician (Cardiology)    Assessment:   This is a routine wellness examination for [place].  Exercise Activities and Dietary recommendations Current Exercise Habits: The patient does not participate in regular exercise at present, Exercise limited by: orthopedic condition(s)  Goals    . Cut out night time snacks     Recommend cutting out all snacks after 7 PM at night.     Marland Kitchen DIET - REDUCE FAT INTAKE     Recommend cutting fat intake in half to help aid in weight loss.        Fall Risk: Fall Risk  04/28/2020 04/23/2019 10/19/2018 04/17/2018 03/28/2017  Falls in the past year? 1 1 0 No No  Number falls in past yr: 1 0 - - -  Injury with Fall? 1 0 - - -  Risk for fall due to : Impaired balance/gait - - - -  Follow up Falls prevention discussed Falls prevention discussed - - -  Comment Offered PT order to build strength an assist with balance issues. Pt declined order. - - - -    FALL RISK PREVENTION PERTAINING TO THE HOME:  Any stairs in or around  the home? Yes  If so, are there any without handrails? No   Home free of loose throw rugs in walkways, pet beds, electrical cords, etc? Yes  Adequate lighting in your home to reduce risk of falls? Yes   ASSISTIVE DEVICES UTILIZED TO PREVENT FALLS:  Life alert? No  Use of a cane, walker or w/c? Yes  Grab bars in the bathroom? Yes  Shower chair or bench in shower? Yes  Elevated toilet seat or a handicapped toilet? Yes    TIMED UP AND GO:  Was the test performed? No .    Depression Screen PHQ 2/9 Scores 04/28/2020 04/23/2019 10/19/2018 04/17/2018  PHQ - 2 Score 0 0 0 0  PHQ- 9 Score - - - 9     Cognitive Function     6CIT Screen 04/28/2020 04/17/2018 03/28/2017  What Year? 0 points 0 points 0 points  What month? 0 points 0 points 0 points  What time? 0 points 0 points 0 points  Count back from 20 0 points 0 points 0 points  Months in reverse 0 points 2 points 2 points  Repeat phrase 0 points 0 points 0 points  Total Score 0 2 2    Immunization History  Administered Date(s) Administered  . Influenza Split 09/20/2006, 09/02/2010, 09/10/2012  . Influenza Whole 08/28/2012  . Influenza, High Dose Seasonal PF 08/28/2014, 08/10/2015, 07/25/2016, 10/03/2017, 08/30/2018, 09/18/2019  . Influenza-Unspecified 08/28/2013  . Moderna SARS-COVID-2 Vaccination 01/09/2020, 02/06/2020  . Pneumococcal Conjugate-13 08/28/2014  . Pneumococcal Polysaccharide-23 03/07/2011  . Tdap 08/28/2014  . Zoster 11/04/2012    Qualifies for Shingles Vaccine? Yes  Zostavax completed 11/04/12. Due for Shingrix. Pt has been advised to call insurance company to determine out of pocket expense. Advised may also receive vaccine at local pharmacy or Health Dept. Verbalized acceptance and understanding.  Tdap: Up  to date  Flu Vaccine: Up to date  Pneumococcal Vaccine: Completed series  Screening Tests Health Maintenance  Topic Date Due  . DEXA SCAN  03/10/2018  . INFLUENZA VACCINE  06/28/2020  . COLONOSCOPY   06/10/2022  . TETANUS/TDAP  08/28/2024  . COVID-19 Vaccine  Completed  . PNA vac Low Risk Adult  Completed    Cancer Screenings:  Colorectal Screening: Completed 06/11/19. Repeat every 3 years.   Mammogram: No longer required.   Bone Density: Completed 03/10/16. Results reflect OSTEOPOROSIS. Repeat every 2 years. Ordered today. Pt aware the office will call re: appt.  Lung Cancer Screening: (Low Dose CT Chest recommended if Age 89-80 years, 30 pack-year currently smoking OR have quit w/in 15years.) does not qualify.   Additional Screening:  Vision Screening: Recommended annual ophthalmology exams for early detection of glaucoma and other disorders of the eye.  Dental Screening: Recommended annual dental exams for proper oral hygiene  Community Resource Referral:  CRR required this visit? No     Plan:  I have personally reviewed and addressed the Medicare Annual Wellness questionnaire and have noted the following in the patient's chart:  A. Medical and social history B. Use of alcohol, tobacco or illicit drugs  C. Current medications and supplements D. Functional ability and status E.  Nutritional status F.  Physical activity G. Advance directives H. List of other physicians I.  Hospitalizations, surgeries, and ER visits in previous 12 months J.  [place] such as hearing and vision if needed, cognitive and depression L. Referrals and appointments   In addition, I have reviewed and discussed with patient certain preventive protocols, quality metrics, and best practice recommendations. A written personalized care plan for preventive services as well as general preventive health recommendations were provided to patient. Nurse Health Advisor  Signed,    Visente Kirker [place], [place]  [id number] Nurse Health Advisor   Nurse Notes: Pt c/o of ankle swelling and cramps in her feet. Scheduled an apt for 04/30/20 to f/u on these concerns. No other s/s noted. Advised pt to call  the office if anything changes or gets worse. Pt stated understanding.

## 2020-04-28 ENCOUNTER — Other Ambulatory Visit: Payer: Self-pay

## 2020-04-28 ENCOUNTER — Ambulatory Visit (INDEPENDENT_AMBULATORY_CARE_PROVIDER_SITE_OTHER): Payer: Medicare Other

## 2020-04-28 DIAGNOSIS — M810 Age-related osteoporosis without current pathological fracture: Secondary | ICD-10-CM | POA: Diagnosis not present

## 2020-04-28 DIAGNOSIS — Z0000 Encounter for general adult medical examination without abnormal findings: Secondary | ICD-10-CM | POA: Diagnosis not present

## 2020-04-28 NOTE — Patient Instructions (Signed)
Crystal White , Thank you for taking time to come for your Medicare Wellness Visit. I appreciate your ongoing commitment to your health goals. Please review the following plan we discussed and let me know if I can assist you in the future.   Screening recommendations/referrals: Colonoscopy: Up to date, due 05/2022 Mammogram: No longer required.  Bone Density: Ordered today. Pt aware the office will call re: appt. Recommended yearly ophthalmology/optometry visit for glaucoma screening and checkup Recommended yearly dental visit for hygiene and checkup  Vaccinations: Influenza vaccine: Up to date Pneumococcal vaccine: Completed series Tdap vaccine: Up to date, due 08/2024 Shingles vaccine: Pt declines today.     Advanced directives: Please bring a copy of your POA (Power of Attorney) and/or Living Will to your next appointment.   Conditions/risks identified: Fall risk preventatives discussed today. Recommend to stop eating after 7:00 PM at night to help with weight loss.   Next appointment: 78/3/21 @ 1:20 PM with Dr Rosanna Randy . Declined scheduling an AWV for 2022 at this time.    Preventive Care 78 Years and Older, Female Preventive care refers to lifestyle choices and visits with your health care provider that can promote health and wellness. What does preventive care include?  A yearly physical exam. This is also called an annual well check.  Dental exams once or twice a year.  Routine eye exams. Ask your health care provider how often you should have your eyes checked.  Personal lifestyle choices, including:  Daily care of your teeth and gums.  Regular physical activity.  Eating a healthy diet.  Avoiding tobacco and drug use.  Limiting alcohol use.  Practicing safe sex.  Taking low-dose aspirin every day.  Taking vitamin and mineral supplements as recommended by your health care provider. What happens during an annual well check? The services and screenings done by your  health care provider during your annual well check will depend on your age, overall health, lifestyle risk factors, and family history of disease. Counseling  Your health care provider may ask you questions about your:  Alcohol use.  Tobacco use.  Drug use.  Emotional well-being.  Home and relationship well-being.  Sexual activity.  Eating habits.  History of falls.  Memory and ability to understand (cognition).  Work and work Statistician.  Reproductive health. Screening  You may have the following tests or measurements:  Height, weight, and BMI.  Blood pressure.  Lipid and cholesterol levels. These may be checked every 5 years, or more frequently if you are over 67 years old.  Skin check.  Lung cancer screening. You may have this screening every year starting at age 32 if you have a 30-pack-year history of smoking and currently smoke or have quit within the past 15 years.  Fecal occult blood test (FOBT) of the stool. You may have this test every year starting at age 21.  Flexible sigmoidoscopy or colonoscopy. You may have a sigmoidoscopy every 5 years or a colonoscopy every 10 years starting at age 67.  Hepatitis C blood test.  Hepatitis B blood test.  Sexually transmitted disease (STD) testing.  Diabetes screening. This is done by checking your blood sugar (glucose) after you have not eaten for a while (fasting). You may have this done every 1-3 years.  Bone density scan. This is done to screen for osteoporosis. You may have this done starting at age 55.  Mammogram. This may be done every 1-2 years. Talk to your health care provider about how often you  should have regular mammograms. Talk with your health care provider about your test results, treatment options, and if necessary, the need for more tests. Vaccines  Your health care provider may recommend certain vaccines, such as:  Influenza vaccine. This is recommended every year.  Tetanus, diphtheria, and  acellular pertussis (Tdap, Td) vaccine. You may need a Td booster every 10 years.  Zoster vaccine. You may need this after age 69.  Pneumococcal 13-valent conjugate (PCV13) vaccine. One dose is recommended after age 32.  Pneumococcal polysaccharide (PPSV23) vaccine. One dose is recommended after age 67. Talk to your health care provider about which screenings and vaccines you need and how often you need them. This information is not intended to replace advice given to you by your health care provider. Make sure you discuss any questions you have with your health care provider. Document Released: 12/11/2015 Document Revised: 08/03/2016 Document Reviewed: 09/15/2015 Elsevier Interactive Patient Education  2017 [employer] Prevention in the Home Falls can cause injuries. They can happen to people of all ages. There are many things you can do to make your home safe and to help prevent falls. What can I do on the outside of my home?  Regularly fix the edges of walkways and driveways and fix any cracks.  Remove anything that might make you trip as you walk through a door, such as a raised step or threshold.  Trim any bushes or trees on the path to your home.  Use bright outdoor lighting.  Clear any walking paths of anything that might make someone trip, such as rocks or tools.  Regularly check to see if handrails are loose or broken. Make sure that both sides of any steps have handrails.  Any raised decks and porches should have guardrails on the edges.  Have any leaves, snow, or ice cleared regularly.  Use sand or salt on walking paths during winter.  Clean up any spills in your garage right away. This includes oil or grease spills. What can I do in the bathroom?  Use night lights.  Install grab bars by the toilet and in the tub and shower. Do not use towel bars as grab bars.  Use non-skid mats or decals in the tub or shower.  If you need to sit down in the shower, use  a plastic, non-slip stool.  Keep the floor dry. Clean up any water that spills on the floor as soon as it happens.  Remove soap buildup in the tub or shower regularly.  Attach bath mats securely with double-sided non-slip rug tape.  Do not have throw rugs and other things on the floor that can make you trip. What can I do in the bedroom?  Use night lights.  Make sure that you have a light by your bed that is easy to reach.  Do not use any sheets or blankets that are too big for your bed. They should not hang down onto the floor.  Have a firm chair that has side arms. You can use this for support while you get dressed.  Do not have throw rugs and other things on the floor that can make you trip. What can I do in the kitchen?  Clean up any spills right away.  Avoid walking on wet floors.  Keep items that you use a lot in easy-to-reach places.  If you need to reach something above you, use a strong step stool that has a grab bar.  Keep electrical cords out  of the way.  Do not use floor polish or wax that makes floors slippery. If you must use wax, use non-skid floor wax.  Do not have throw rugs and other things on the floor that can make you trip. What can I do with my stairs?  Do not leave any items on the stairs.  Make sure that there are handrails on both sides of the stairs and use them. Fix handrails that are broken or loose. Make sure that handrails are as long as the stairways.  Check any carpeting to make sure that it is firmly attached to the stairs. Fix any carpet that is loose or worn.  Avoid having throw rugs at the top or bottom of the stairs. If you do have throw rugs, attach them to the floor with carpet tape.  Make sure that you have a light switch at the top of the stairs and the bottom of the stairs. If you do not have them, ask someone to add them for you. What else can I do to help prevent falls?  Wear shoes that:  Do not have high heels.  Have  rubber bottoms.  Are comfortable and fit you well.  Are closed at the toe. Do not wear sandals.  If you use a stepladder:  Make sure that it is fully opened. Do not climb a closed stepladder.  Make sure that both sides of the stepladder are locked into place.  Ask someone to hold it for you, if possible.  Clearly mark and make sure that you can see:  Any grab bars or handrails.  First and last steps.  Where the edge of each step is.  Use tools that help you move around (mobility aids) if they are needed. These include:  Canes.  Walkers.  Scooters.  Crutches.  Turn on the lights when you go into a dark area. Replace any light bulbs as soon as they burn out.  Set up your furniture so you have a clear path. Avoid moving your furniture around.  If any of your floors are uneven, fix them.  If there are any pets around you, be aware of where they are.  Review your medicines with your doctor. Some medicines can make you feel dizzy. This can increase your chance of falling. Ask your doctor what other things that you can do to help prevent falls. This information is not intended to replace advice given to you by your health care provider. Make sure you discuss any questions you have with your health care provider. Document Released: 09/10/2009 Document Revised: 04/21/2016 Document Reviewed: 12/19/2014 Elsevier Interactive Patient Education  2017 [employer].

## 2020-04-29 NOTE — Progress Notes (Signed)
Established patient visit  I,[name],acting as a scribe for [name], MD.,have documented all relevant documentation on the behalf of [name], MD,as directed by  [name], MD while in the presence of [name], MD.   Patient: Crystal White   DOB: [birth year]   79 y.o. Female  MRN: [id number] Visit Date: 04/30/2020  Today's healthcare provider: [name], MD   Chief Complaint  Patient presents with  . Edema   Subjective    HPI Patient is here concerning swelling in right ankle for around 2 weeks. Patient states ankle is normal size in the morning but swells by the evening. Patient also has been having occasional cramps in the right calf. Patient states she has been trying to keep her foot elevated. Patient also states she fell backwards off her steps outside 2 weeks ago. Patient states she fell on the ground. Patient states her left side/rib area has been sore ever since. Patient states it does hurt when she takes a deep breath. Patient also has some pain in her left knee.  Again from the mechanical fall she injured her left knee her right ankle and her left lateral ribs. She continues to smoke but claims she has no increased breathing problems.  She takes all her medications as prescribed.     Medications: Outpatient Medications Prior to Visit  Medication Sig  . acetaminophen (TYLENOL) 500 MG tablet Take 1,000 mg by mouth every 6 (six) hours as needed (pain/headaches.).   Marland Kitchen albuterol (PROVENTIL HFA) 108 (90 Base) MCG/ACT inhaler Inhale 2 puffs into the lungs every 6 (six) hours as needed for wheezing or shortness of breath.  Marland Kitchen BREO ELLIPTA 100-25 MCG/INH AEPB USE 1 INHALATION BY MOUTH  DAILY  . citalopram (CELEXA) 20 MG tablet Take 1 tablet (20 mg total) by mouth daily. (Patient taking differently: Take 20 mg by mouth at bedtime. )  . ezetimibe (ZETIA) 10 MG tablet TAKE 1 TABLET BY MOUTH  DAILY (Patient taking differently: Take  10 mg by mouth daily. )  . gabapentin (NEURONTIN) 100 MG capsule TAKE 2 CAPSULES BY MOUTH 3  TIMES DAILY (Patient taking differently: Take 200 mg by mouth 3 (three) times daily. )  . hydrochlorothiazide (HYDRODIURIL) 25 MG tablet TAKE 1 TABLET BY MOUTH  DAILY (Patient taking differently: Take 25 mg by mouth daily. )  . hyoscyamine (LEVBID) 0.375 MG 12 hr tablet Take 1 tablet (0.375 mg total) by mouth 2 (two) times daily as needed for cramping. Reported on 05/03/2016  . loperamide (IMODIUM A-D) 2 MG tablet Take 2 mg by mouth at bedtime.  Marland Kitchen losartan (COZAAR) 50 MG tablet TAKE 1 TABLET BY MOUTH AT  BEDTIME (Patient taking differently: Take 50 mg by mouth at bedtime. )  . meclizine (ANTIVERT) 25 MG tablet Take 1 tablet (25 mg total) by mouth 2 (two) times daily as needed for dizziness.  . mirtazapine (REMERON) 30 MG tablet TAKE 1 TABLET BY MOUTH AT  BEDTIME (Patient taking differently: Take 30 mg by mouth at bedtime. )  . OXYGEN Inhale into the lungs daily. Uses at night with CPAP and PRN during daytime  . pantoprazole (PROTONIX) 40 MG tablet TAKE 1 TABLET BY MOUTH TWO  TIMES DAILY (Patient taking differently: Take 40 mg by mouth 2 (two) times daily. )  . traZODone (DESYREL) 150 MG tablet TAKE 1 TABLET BY MOUTH AT  BEDTIME (Patient taking differently: Take 150 mg by mouth at bedtime. )  .  doxycycline (VIBRA-TABS) 100 MG tablet Take 1 tablet (100 mg total) by mouth 2 (two) times daily. (Patient not taking: Reported on 04/28/2020)  . HYDROcodone-acetaminophen (NORCO) 5-325 MG tablet Take 1 tablet by mouth every 6 (six) hours as needed. (Patient not taking: Reported on 02/13/2020)   No facility-administered medications prior to visit.    Review of Systems  Constitutional: Negative for appetite change, chills, fatigue and fever.  HENT: Negative.   Eyes: Negative.   Respiratory: Negative for chest tightness and shortness of breath.   Cardiovascular: Negative for chest pain and palpitations.    Gastrointestinal: Negative for abdominal pain, nausea and vomiting.  Endocrine: Negative.   Musculoskeletal: Positive for arthralgias and joint swelling.  Allergic/Immunologic: Negative.   Neurological: Negative for dizziness and weakness.  Psychiatric/Behavioral: Negative.        Objective    BP (!) 165/82 (BP Location: Right Arm, Patient Position: Sitting, Cuff Size: Large)   Pulse 92   Temp (!) 96.9 F (36.1 C) (Other (Comment))   Resp 18   Ht 5\' 4"  (1.626 m)   Wt 147 lb (66.7 kg)   SpO2 94%   BMI 25.23 kg/m  BP Readings from Last 3 Encounters:  04/30/20 (!) 165/82  02/13/20 (!) 159/85  01/22/20 (!) 166/76   Wt Readings from Last 3 Encounters:  04/30/20 147 lb (66.7 kg)  02/13/20 152 lb (68.9 kg)  01/22/20 145 lb (65.8 kg)      Physical Exam Vitals and nursing note reviewed.  Constitutional:      Appearance: Normal appearance. She is normal weight.  HENT:     Right Ear: Tympanic membrane normal.     Left Ear: Tympanic membrane normal.     Nose: Nose normal.     Mouth/Throat:     Mouth: Mucous membranes are moist.     Pharynx: Oropharynx is clear.  Eyes:     Conjunctiva/sclera: Conjunctivae normal.  Cardiovascular:     Rate and Rhythm: Normal rate and regular rhythm.     Pulses: Normal pulses.     Heart sounds: Normal heart sounds.  Pulmonary:     Effort: Pulmonary effort is normal.     Breath sounds: Normal breath sounds.  Abdominal:     General: Bowel sounds are normal.     Palpations: Abdomen is soft.  Musculoskeletal:        General: Normal range of motion.     Cervical back: Normal range of motion and neck supple.     Comments: Minimal swelling over right lateral malleolus.  This is just inferior to the malleolus.  No real tenderness.  Left knee is normal and ribs are only mildly tender without crepitus.  Skin:    General: Skin is warm and dry.  Neurological:     General: No focal deficit present.     Mental Status: She is alert.  Psychiatric:         Mood and Affect: Mood normal.        Behavior: Behavior normal.        Thought Content: Thought content normal.        Judgment: Judgment normal.       No results found for any visits on 04/30/20.  Assessment & Plan     1. Essential (primary) hypertension Increase losartan from 50 mg daily to 100 mg daily. - losartan (COZAAR) 100 MG tablet; Take 1 tablet (100 mg total) by mouth daily.  Dispense: 90 tablet; Refill: 2. Chronic obstructive pulmonary disease,  unspecified COPD type ([place])   3. Obstructive sleep apnea syndrome Wears CPAP nightly  4. Barrett's esophagus with dysplasia Chronic PPI  5. Sprain of right ankle, unspecified ligament, initial encounter This is resolving on its own.  No imaging or referral necessary.  6. Mild cognitive impairment MMSE on next visit.  7. Tobacco abuse Patient advised to quit.  8. Contusion of rib on left side, initial encounter Improving  9. Sprain of other ligament of left knee, initial encounter Improving.   No follow-ups on file.      I, [name], MD, have reviewed all documentation for this visit. The documentation on 05/03/20 for the exam, diagnosis, procedures, and orders are all accurate and complete.    Clement Deneault Cranford Mon, MD  [facility] [phone] (phone) [phone] (fax)  [place]

## 2020-04-30 ENCOUNTER — Encounter: Payer: Self-pay | Admitting: Family Medicine

## 2020-04-30 ENCOUNTER — Other Ambulatory Visit: Payer: Self-pay

## 2020-04-30 ENCOUNTER — Ambulatory Visit (INDEPENDENT_AMBULATORY_CARE_PROVIDER_SITE_OTHER): Payer: Medicare Other | Admitting: Family Medicine

## 2020-04-30 VITALS — BP 165/82 | HR 92 | Temp 96.9°F | Resp 18 | Ht 64.0 in | Wt 147.0 lb

## 2020-04-30 DIAGNOSIS — Z720 Tobacco use: Secondary | ICD-10-CM

## 2020-04-30 DIAGNOSIS — I10 Essential (primary) hypertension: Secondary | ICD-10-CM

## 2020-04-30 DIAGNOSIS — G3184 Mild cognitive impairment, so stated: Secondary | ICD-10-CM

## 2020-04-30 DIAGNOSIS — S20212A Contusion of left front wall of thorax, initial encounter: Secondary | ICD-10-CM

## 2020-04-30 DIAGNOSIS — S93401A Sprain of unspecified ligament of right ankle, initial encounter: Secondary | ICD-10-CM | POA: Diagnosis not present

## 2020-04-30 DIAGNOSIS — G4733 Obstructive sleep apnea (adult) (pediatric): Secondary | ICD-10-CM | POA: Diagnosis not present

## 2020-04-30 DIAGNOSIS — J449 Chronic obstructive pulmonary disease, unspecified: Secondary | ICD-10-CM

## 2020-04-30 DIAGNOSIS — K22719 Barrett's esophagus with dysplasia, unspecified: Secondary | ICD-10-CM

## 2020-04-30 DIAGNOSIS — S838X2A Sprain of other specified parts of left knee, initial encounter: Secondary | ICD-10-CM

## 2020-04-30 MED ORDER — LOSARTAN POTASSIUM 100 MG PO TABS: 100.0000 mg | ORAL_TABLET | Freq: Every day | ORAL | 2 refills | Status: DC

## 2020-04-30 NOTE — Patient Instructions (Signed)
Increase losartan from 50 mg daily to 100 mg daily.

## 2020-05-13 DIAGNOSIS — J449 Chronic obstructive pulmonary disease, unspecified: Secondary | ICD-10-CM | POA: Diagnosis not present

## 2020-05-22 ENCOUNTER — Other Ambulatory Visit: Payer: Self-pay | Admitting: Family Medicine

## 2020-05-22 DIAGNOSIS — F324 Major depressive disorder, single episode, in partial remission: Secondary | ICD-10-CM

## 2020-05-22 DIAGNOSIS — E7800 Pure hypercholesterolemia, unspecified: Secondary | ICD-10-CM

## 2020-05-22 NOTE — Telephone Encounter (Signed)
Requested  medications are  due for refill today yes  Requested medications are on the active medication list yes  Last refill 5/5  Last visit this week  Notes to clinic Celexa dose is not on current med list, and Zetia failed protocol due to pertinent lab work past 1 year.

## 2020-05-22 NOTE — Telephone Encounter (Signed)
Requested  medications are  due for refill today yes  Requested medications are on the active medication list yes  Last refill 04/01/20  Last visit this week   Notes to clinic Failed protocol due to pertinent labs out of date

## 2020-05-26 ENCOUNTER — Ambulatory Visit
Admission: EM | Admit: 2020-05-26 | Discharge: 2020-05-26 | Disposition: A | Discharge status: Home / Self Care | Payer: Medicare Other | Attending: Physician Assistant | Admitting: Physician Assistant

## 2020-05-26 ENCOUNTER — Encounter: Payer: Self-pay | Admitting: Emergency Medicine

## 2020-05-26 ENCOUNTER — Ambulatory Visit (INDEPENDENT_AMBULATORY_CARE_PROVIDER_SITE_OTHER): Payer: Medicare Other

## 2020-05-26 ENCOUNTER — Other Ambulatory Visit: Payer: Self-pay

## 2020-05-26 DIAGNOSIS — M25461 Effusion, right knee: Secondary | ICD-10-CM | POA: Diagnosis not present

## 2020-05-26 DIAGNOSIS — S8992XA Unspecified injury of left lower leg, initial encounter: Secondary | ICD-10-CM | POA: Diagnosis not present

## 2020-05-26 DIAGNOSIS — W19XXXA Unspecified fall, initial encounter: Secondary | ICD-10-CM | POA: Diagnosis not present

## 2020-05-26 DIAGNOSIS — S82031A Displaced transverse fracture of right patella, initial encounter for closed fracture: Secondary | ICD-10-CM | POA: Diagnosis not present

## 2020-05-26 DIAGNOSIS — M25562 Pain in left knee: Secondary | ICD-10-CM | POA: Diagnosis not present

## 2020-05-26 MED ORDER — KETOROLAC TROMETHAMINE 60 MG/2ML IM SOLN
15.0000 mg | Freq: Once | INTRAMUSCULAR | Status: AC
  Administered 2020-05-26: 15 mg via INTRAMUSCULAR

## 2020-05-26 NOTE — ED Triage Notes (Signed)
Pt fell this morning about 10 am. She states she was trimming her rose bushes and fell over table and chairs and landed on concrete. She has abrasions on bilateral knees and pain. She states they are slightly swollen and have gotten worse as the day has gone on. She states the pain is worse when she tried to stand up. She has taken tylenol for the pain.

## 2020-05-26 NOTE — Discharge Instructions (Signed)
-  Tylenol and ibuprofen for pain -Rest ice and elevation -Would wear the knee immobilizer until you are able to be seen by orthopedics -Follow-up with orthopedics, numbers for [facility] and [facility] are provided.

## 2020-05-26 NOTE — ED Provider Notes (Signed)
[facility]    CSN: [id number] Arrival date & time: 05/26/20  1447      History   Chief Complaint Chief Complaint  Patient presents with  . Fall    HPI Crystal White is a 79 y.o. female.   Patient is a 79 year old female who presents with complaint of pain to both knees after a fall with morning.  Patient states around 10:00, she was cutting rosebushes in her yard.  After she was done, she tripped over a low decorative wall that surrounds her bushes, falling and landing on her knees on the concrete.  Patient also reports hitting her right thumb on the table.  She states her pain was initially a burning type of sensation but that has given way to more generalized pain.  Patient also reports pain with weightbearing while attempting to stand as well as just straightening her leg.  Patient is taken Tylenol and put on cold wet cloth.     Past Medical History:  Diagnosis Date  . Arthritis    hands, lower back  . Barrett's esophagus   . COPD (chronic obstructive pulmonary disease) ([place])   . Emphysema lung ([place])   . GERD (gastroesophageal reflux disease)   . Hernia of abdominal cavity   . Hyperlipidemia   . Hypertension   . Mobitz type II atrioventricular block   . OSA (obstructive sleep apnea)    on CPAP  . Peripheral neuropathy   . Presence of permanent cardiac pacemaker 09/22/11   Biotronik - EVIADR-T [facility])  . Shortness of breath dyspnea   . Stomach ulcer     Patient Active Problem List   Diagnosis Date Noted  . Incisional hernia, without obstruction or gangrene 06/03/2016  . Osteoporosis 02/22/2016  . Musculoskeletal chest pain 07/14/2015  . Chronic respiratory failure ([place]) 07/14/2015  . Acute bronchitis 07/14/2015  . Other emphysema ([place]) 07/14/2015  . Chronic renal insufficiency 07/14/2015  . Chest pain 07/13/2015  . Multiple pulmonary nodules 06/02/2015  . Colon polyp 05/29/2015  . Hemoptysis 04/29/2015  . Sprain of ankle 04/14/2015  .  Anxiety 04/14/2015  . Barrett's esophagus 04/14/2015  . Acute exacerbation of chronic obstructive airways disease ([place]) 04/14/2015  . Bursitis 04/14/2015  . Cellulitis 04/14/2015  . Claudication ([place]) 04/14/2015  . Cough 04/14/2015  . Deficiency, 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  . Clinical depression 04/14/2015  . Dizziness 04/14/2015  . Dyslipidemia 04/14/2015  . Essential (primary) hypertension 04/14/2015  . Flank pain 04/14/2015  . H/O suicide attempt 04/14/2015  . Dysphonia 04/14/2015  . Hypercholesteremia 04/14/2015  . Cannot sleep 04/14/2015  . Malaise and fatigue 04/14/2015  . Mild cognitive impairment 04/14/2015  . Cramp in muscle 04/14/2015  . Muscle ache 04/14/2015  . Neuropathy 04/14/2015  . Adiposity 04/14/2015  . 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 Candida infection of mouth 04/14/2015  . Abnormal loss of weight 04/14/2015  . Decreased body weight 04/14/2015  . Artificial cardiac pacemaker 10/08/2014  . Apnea, sleep 10/08/2014  . COPD (chronic obstructive pulmonary disease) ([place]) 02/13/2012  . Tobacco abuse 02/13/2012  . Sinus congestion 02/13/2012  . Current tobacco use 02/13/2012  . Esophagitis, reflux 08/28/2006  . Abdominal pain, right lower quadrant 08/22/2005  . Acute appendicitis with peritoneal abscess 08/22/2005    Past Surgical History:  Procedure Laterality Date  . APPENDECTOMY  2006   Dr. Pat Patrick  . BROW LIFT Bilateral 05/03/2016   Procedure: BLEPHAROPLASTY  BILATERAL UPPER EYELIDS WITH EXCESS SKIN REMOVAL BILATERAL BROW PTOSIS REPAIR BLEPHAROPTOSIS  REPAIR BILATERAL EYES ;  Surgeon: Karle Starch, MD;  Location: [place];  Service: Ophthalmology;  Laterality: Bilateral;  . CARDIAC CATHETERIZATION  2007  . COLON SURGERY    . COLONOSCOPY WITH PROPOFOL N/A 04/14/2015   Procedure: COLONOSCOPY WITH PROPOFOL;  Surgeon: Lollie Sails, MD;  Location: [facility] ENDOSCOPY;  Service: Endoscopy;  Laterality: N/A;  . COLONOSCOPY WITH PROPOFOL N/A  01/24/2019   Procedure: COLONOSCOPY WITH PROPOFOL;  Surgeon: Lollie Sails, MD;  Location: [facility] ENDOSCOPY;  Service: Endoscopy;  Laterality: N/A;  . COLONOSCOPY WITH PROPOFOL N/A 06/11/2019   Procedure: COLONOSCOPY WITH PROPOFOL;  Surgeon: Lollie Sails, MD;  Location[facility] ENDOSCOPY;  Service: Endoscopy;  Laterality: N/A;  . ESOPHAGOGASTRODUODENOSCOPY N/A 04/14/2015   Procedure: ESOPHAGOGASTRODUODENOSCOPY (EGD);  Surgeon: Lollie Sails, MD;  Location: [facility] ENDOSCOPY;  Service: Endoscopy;  Laterality: N/A;  . ESOPHAGOGASTRODUODENOSCOPY (EGD) WITH PROPOFOL N/A 07/05/2017   Procedure: ESOPHAGOGASTRODUODENOSCOPY (EGD) WITH PROPOFOL;  Surgeon: Manya Silvas, MD;  Location[facility];  Service: Endoscopy;  Laterality: N/A;  . ESOPHAGOGASTRODUODENOSCOPY (EGD) WITH PROPOFOL N/A 01/24/2019   Procedure: ESOPHAGOGASTRODUODENOSCOPY (EGD) WITH PROPOFOL;  Surgeon: Lollie Sails, MD;  Location: [facility] ENDOSCOPY;  Service: Endoscopy;  Laterality: N/A;  . HERNIA REPAIR    . HIATAL HERNIA REPAIR  2007  . INSERT / REPLACE / REMOVE PACEMAKER    . LAPAROSCOPIC RIGHT HEMI COLECTOMY Right 06/22/2015   Procedure: LAPAROSCOPIC RIGHT HEMI COLECTOMY;  Surgeon: Marlyce Huge, MD;  Location: [facility] ORS;  Service: General;  Laterality: Right;  . NISSEN FUNDOPLICATION  8416  . OPEN REDUCTION INTERNAL FIXATION (ORIF) DISTAL RADIAL FRACTURE Left 08/22/2019   Procedure: OPEN REDUCTION INTERNAL FIXATION (ORIF) DISTAL RADIAL FRACTURE;  Surgeon: Hessie Knows, MD;  Location: [facility] ORS;  Service: Orthopedics;  Laterality: Left;  . PACEMAKER INSERTION  09/22/11   [facility]  - Biotronik EVIADR-T  . TOTAL ABDOMINAL HYSTERECTOMY  1980    OB History   No obstetric history on file.      Home Medications    Prior to Admission medications   Medication Sig Start Date End Date Taking? Authorizing Provider  acetaminophen (TYLENOL) 500 MG tablet Take 1,000 mg by mouth every 6 (six) hours as needed  (pain/headaches.).    Yes [provider]  albuterol (PROVENTIL HFA) 108 (90 Base) MCG/ACT inhaler Inhale 2 puffs into the lungs every 6 (six) hours as needed for wheezing or shortness of breath. 01/13/18  Yes Birdie Sons, MD  BREO ELLIPTA 100-25 MCG/INH AEPB USE 1 INHALATION BY MOUTH  DAILY 01/22/20  Yes Jerrol Banana., MD  citalopram (CELEXA) 20 MG tablet Take 1 tablet (20 mg total) by mouth daily. Patient taking differently: Take 20 mg by mouth at bedtime.  03/26/19  Yes Jerrol Banana., MD  ezetimibe (ZETIA) 10 MG tablet TAKE 1 TABLET BY MOUTH  DAILY 05/25/20  Yes Jerrol Banana., MD  gabapentin (NEURONTIN) 100 MG capsule TAKE 2 CAPSULES BY MOUTH 3  TIMES DAILY Patient taking differently: Take 200 mg by mouth 3 (three) times daily.  05/09/19  Yes Jerrol Banana., MD  hydrochlorothiazide (HYDRODIURIL) 25 MG tablet TAKE 1 TABLET BY MOUTH  DAILY 05/22/20  Yes Jerrol Banana., MD  loperamide (IMODIUM A-D) 2 MG tablet Take 2 mg by mouth at bedtime.   Yes [provider]  losartan (COZAAR) 100 MG tablet Take 1 tablet (100 mg total) by mouth daily. 04/30/20  Yes Jerrol Banana., MD  meclizine (ANTIVERT) 25 MG tablet Take 1 tablet (25 mg total) by mouth 2 (two) times daily as needed for dizziness. 09/14/16  Yes Jerrol Banana., MD  mirtazapine (REMERON) 30 MG tablet TAKE 1 TABLET BY MOUTH AT  BEDTIME Patient taking differently: Take 30 mg by mouth at bedtime.  05/09/19  Yes Jerrol Banana., MD  OXYGEN Inhale into the lungs daily. Uses at night with CPAP and PRN during daytime   Yes [provider]  pantoprazole (PROTONIX) 40 MG tablet TAKE 1 TABLET BY MOUTH  TWICE DAILY 05/22/20  Yes Jerrol Banana., MD  traZODone (DESYREL) 150 MG tablet TAKE 1 TABLET BY MOUTH AT  BEDTIME 05/22/20  Yes Jerrol Banana., MD  citalopram (CELEXA) 10 MG tablet TAKE 1 TABLET BY MOUTH AT  BEDTIME 05/25/20   Jerrol Banana., MD    HYDROcodone-acetaminophen [facility]) 5-325 MG tablet Take 1 tablet by mouth every 6 (six) hours as needed. Patient not taking: Reported on 02/13/2020 08/22/19   Hessie Knows, MD  hyoscyamine (LEVBID) 0.375 MG 12 hr tablet Take 1 tablet (0.375 mg total) by mouth 2 (two) times daily as needed for cramping. Reported on 05/03/2016 09/14/16   Jerrol Banana., MD    Family History Family History  Adopted: Yes  Problem Relation Age of Onset  . Diabetes Son     Social History Social History   Tobacco Use  . Smoking status: Current Every Day Smoker    Packs/day: 1.00    Years: 55.00    Pack years: 55.00    Types: Cigarettes  . Smokeless tobacco: Never Used  Vaping Use  . Vaping Use: Never used  Substance Use Topics  . Alcohol use: Yes    Alcohol/week: 0.0 standard drinks    Comment: 1/2 glass wine -rare  . Drug use: No     Allergies   Iodinated diagnostic agents, Statins, and Bacitracin   Review of Systems Review of Systems as noted above in HPI.  Other system reviewed and found to be negative   Physical Exam Triage Vital Signs ED Triage Vitals  Enc Vitals Group     BP 05/26/20 1505 121/69     Pulse Rate 05/26/20 1505 88     Resp 05/26/20 1505 18     Temp 05/26/20 1505 98.5 F (36.9 C)     Temp Source 05/26/20 1505 Oral     SpO2 05/26/20 1505 93 %     Weight 05/26/20 1500 147 lb 0.8 oz (66.7 kg)     Height 05/26/20 1500 5\' 4"  (1.626 m)     Head Circumference --      Peak Flow --      Pain Score 05/26/20 1500 9     Pain Loc --      Pain Edu? --      Excl. in [place]? --    No data found.  Updated Vital Signs BP 121/69 (BP Location: Left Arm)   Pulse 88   Temp 98.5 F (36.9 C) (Oral)   Resp 18   Ht 5\' 4"  (1.626 m)   Wt 147 lb 0.8 oz (66.7 kg)   SpO2 93%   BMI 25.24 kg/m   Physical Exam Constitutional:      Appearance: Normal appearance. She is not toxic-appearing.     Comments: Sitting in a wheelchair  HENT:     Head: Normocephalic and atraumatic.   Cardiovascular:     Rate and Rhythm:  Normal rate and regular rhythm.     Pulses: Normal pulses.     Heart sounds: Normal heart sounds.  Pulmonary:     Effort: Pulmonary effort is normal. No respiratory distress.     Breath sounds: No wheezing.  Musculoskeletal:     Right knee: Swelling and bony tenderness present. No crepitus.     Left knee: Swelling and bony tenderness present. No crepitus.     Comments: Abrasions to both knees.  Bony tenderness to both patellas but no tenderness to lateral or medial palpation of the knee.  Edema noted to both knees. No apparent quadriceps or patellar tendon rupture. Able to extend knee but painful  Neurological:     Mental Status: She is alert.      UC Treatments / Results  Labs (all labs ordered are listed, but only abnormal results are displayed) Labs Reviewed - No data to display  EKG   Radiology DG Knee Complete 4 Views Left  Result Date: 05/26/2020 CLINICAL DATA:  Pain status post fall on the concrete. EXAM: LEFT KNEE - COMPLETE 4+ VIEW COMPARISON:  None. FINDINGS: No evidence of fracture, dislocation, or joint effusion. No evidence of arthropathy or other focal bone abnormality. Soft tissues are unremarkable. IMPRESSION: Negative. Electronically Signed   By: Constance Holster M.D.   On: 05/26/2020 16:08   DG Knee Complete 4 Views Right  Result Date: 05/26/2020 CLINICAL DATA:  Pain status post fall EXAM: RIGHT KNEE - COMPLETE 4+ VIEW COMPARISON:  None. FINDINGS: There is an acute, minimally displaced transverse fracture through the lower pole of the patella. There is a large suprapatellar joint effusion with evidence for lipohemarthrosis. There is no dislocation or additional acute displaced fracture. IMPRESSION: 1. Acute, minimally displaced transverse fracture through the lower pole of the patella. 2. Large suprapatellar joint effusion with lipohemarthrosis. Electronically Signed   By: Constance Holster M.D.   On: 05/26/2020 16:07     Procedures Procedures (including critical care time)  Medications Ordered in UC Medications  ketorolac (TORADOL) injection 15 mg (15 mg Intramuscular Given 05/26/20 1547)    Initial Impression / Assessment and Plan / UC Course  I have reviewed the triage vital signs and the nursing notes.  Pertinent labs & imaging results that were available during my care of the patient were reviewed by me and considered in my medical decision making (see chart for details).     Patient with right patellar fracture as noted above in imaging report.  Will patient patient in a knee immobilizer and refer her to orthopedics.  Ibuprofen Tylenol as for pain.  Rest ice elevation.  Advised her to wear the immobilizer until she is seen by orthopedics.  We will provide her with a imaging this with her x-rays.  Final Clinical Impressions(s) / UC Diagnoses   Final diagnoses:  Fall, initial encounter  Closed displaced transverse fracture of right patella, initial encounter     Discharge Instructions     -Tylenol and ibuprofen for pain -Rest ice and elevation -Would wear the knee immobilizer until you are able to be seen by orthopedics -Follow-up with orthopedics, numbers for [facility] and [facility] are provided.    ED Prescriptions    None     PDMP not reviewed this encounter.   Luvenia Redden, PA-C 05/26/20 1636

## 2020-05-27 DIAGNOSIS — W010XXA Fall on same level from slipping, tripping and stumbling without subsequent striking against object, initial encounter: Secondary | ICD-10-CM | POA: Diagnosis not present

## 2020-05-27 DIAGNOSIS — S82044A Nondisplaced comminuted fracture of right patella, initial encounter for closed fracture: Secondary | ICD-10-CM | POA: Diagnosis not present

## 2020-06-03 DIAGNOSIS — S82044D Nondisplaced comminuted fracture of right patella, subsequent encounter for closed fracture with routine healing: Secondary | ICD-10-CM | POA: Diagnosis not present

## 2020-06-03 DIAGNOSIS — L03116 Cellulitis of left lower limb: Secondary | ICD-10-CM | POA: Diagnosis not present

## 2020-06-12 DIAGNOSIS — J449 Chronic obstructive pulmonary disease, unspecified: Secondary | ICD-10-CM | POA: Diagnosis not present

## 2020-06-17 DIAGNOSIS — S82044D Nondisplaced comminuted fracture of right patella, subsequent encounter for closed fracture with routine healing: Secondary | ICD-10-CM | POA: Diagnosis not present

## 2020-06-19 ENCOUNTER — Other Ambulatory Visit: Payer: Self-pay

## 2020-06-19 ENCOUNTER — Ambulatory Visit: Payer: Medicare Other | Admitting: Pulmonary Disease

## 2020-06-19 ENCOUNTER — Encounter: Payer: Self-pay | Admitting: Pulmonary Disease

## 2020-06-19 VITALS — BP 166/82 | HR 97 | Temp 98.0°F | Ht 64.5 in | Wt 143.8 lb

## 2020-06-19 DIAGNOSIS — F17210 Nicotine dependence, cigarettes, uncomplicated: Secondary | ICD-10-CM

## 2020-06-19 DIAGNOSIS — J9611 Chronic respiratory failure with hypoxia: Secondary | ICD-10-CM

## 2020-06-19 DIAGNOSIS — G4733 Obstructive sleep apnea (adult) (pediatric): Secondary | ICD-10-CM

## 2020-06-19 DIAGNOSIS — J449 Chronic obstructive pulmonary disease, unspecified: Secondary | ICD-10-CM | POA: Diagnosis not present

## 2020-06-19 DIAGNOSIS — I10 Essential (primary) hypertension: Secondary | ICD-10-CM | POA: Diagnosis not present

## 2020-06-19 DIAGNOSIS — Z720 Tobacco use: Secondary | ICD-10-CM

## 2020-06-19 MED ORDER — TRELEGY ELLIPTA 100-62.5-25 MCG/INH IN AEPB: 1.0000 | INHALATION_SPRAY | Freq: Every day | RESPIRATORY_TRACT | 0 refills | Status: DC

## 2020-06-19 NOTE — Assessment & Plan Note (Signed)
Plan: Continue CPAP therapy Bring SD card to next office visit so we can check compliance

## 2020-06-19 NOTE — Patient Instructions (Addendum)
You were seen today by Lauraine Rinne, NP  for:   1. Chronic obstructive pulmonary disease, unspecified COPD type (HCC)  Trial of Trelegy Ellipta 100 >>> 1 puff daily in the morning >>>rinse mouth out after use  >>> This inhaler contains 3 medications that help manage her respiratory status, contact our office if you cannot afford this medication or unable to remain on this medication  Contact our office in 2 weeks when you finish the sample and let us know how you are doing on it, if you prefer this we will send in a prescription  Stop Breo 100   Only use your albuterol as a rescue medication to be used if you can't catch your breath by resting or doing a relaxed purse lip breathing pattern.  - The less you use it, the better it will work when you need it. - Ok to use up to 2 puffs  every 4 hours if you must but call for immediate appointment if use goes up over your usual need - Don't leave home without it !!  (think of it like the spare tire for your car)    Note your daily symptoms > remember "red flags" for COPD:   >>>Increase in cough >>>increase in sputum production >>>increase in shortness of breath or activity  intolerance.   If you notice these symptoms, please call the office to be seen.    2. Chronic respiratory failure with hypoxia (HCC)  Walk in office today  Continue oxygen therapy as prescribed  >>>maintain oxygen saturations greater than 88 percent  >>>if unable to maintain oxygen saturations please contact the office  >>>do not smoke with oxygen  >>>can use nasal saline gel or nasal saline rinses to moisturize nose if oxygen causes dryness   3. Tobacco abuse  We recommend that you stop smoking.  >>>You need to set a quit date >>>If you have friends or family who smoke, let them know you are trying to quit and not to smoke around you or in your living environment  Smoking Cessation Resources:  1 800 QUIT NOW  >>> Patient to call this resource and utilize  it to help support her quit smoking >>> Keep up your hard work with stopping smoking  You can also contact the [facility] >>>For smoking cessation classes call 5155838330  We do not recommend using e-cigarettes as a form of stopping smoking  You can sign up for smoking cessation support texts and information:  >>>https://smokefree.gov/smokefreetxt    4. Obstructive sleep apnea syndrome  Continue using your CPAP every night Bring your SD card to our next office visit  We recommend that you continue using your CPAP daily >>>Keep up the hard work using your device >>> Goal should be wearing this for the entire night that you are sleeping, at least 4 to 6 hours  Remember:  . Do not drive or operate heavy machinery if tired or drowsy.  . Please notify the supply company and office if you are unable to use your device regularly due to missing supplies or machine being broken.  . Work on maintaining a healthy weight and following your recommended nutrition plan  . Maintain proper daily exercise and movement  . Maintaining proper use of your device can also help improve management of other chronic illnesses such as: Blood pressure, blood sugars, and weight management.   BiPAP/ CPAP Cleaning:  >>>Clean weekly, with Dawn soap, and bottle brush.  Set up to air dry. >>>  Wipe mask out daily with wet wipe or towelette     Follow Up:    Return in about 2 months (around 08/20/2020), or if symptoms worsen or fail to improve, for Follow up with Wyn Quaker FNP-C, [facility] - Dr. Patsey Berthold.  Please also schedule appointment in 3 to 4 months with Dr. Patsey Berthold and 30-minute time slot to establish care.       Please do your part to reduce the spread of COVID-19:      Reduce your risk of any infection  and COVID19 by using the similar precautions used for avoiding the common cold or flu:  Marland Kitchen Wash your hands often with soap and warm water for at least 20 seconds.   If soap and water are not readily available, use an alcohol-based hand sanitizer with at least 60% alcohol.  . If coughing or sneezing, cover your mouth and nose by coughing or sneezing into the elbow areas of your shirt or coat, into a tissue or into your sleeve (not your hands). Langley Gauss A MASK when in public  . Avoid shaking hands with others and consider head nods or verbal greetings only. . Avoid touching your eyes, nose, or mouth with unwashed hands.  . Avoid close contact with people who are sick. . Avoid places or events with large numbers of people in one location, like concerts or sporting events. . If you have some symptoms but not all symptoms, continue to monitor at home and seek medical attention if your symptoms worsen. . If you are having a medical emergency, call 911.   [place] / e-Visit: [email or web address]         [facility]: [facility]: [phone]                   [facility]: [phone]     It is flu season:   >>> Best ways to protect herself from the flu: Receive the yearly flu vaccine, practice good hand hygiene washing with soap and also using hand sanitizer when available, eat a nutritious meals, get adequate rest, hydrate appropriately   Please contact the office if your symptoms worsen or you have concerns that you are not improving.   Thank you for choosing [facility] for your healthcare, and for allowing [place] to partner with you on your healthcare journey. I am thankful to be able to provide care to you today.   Wyn Quaker FNP-C

## 2020-06-19 NOTE — Assessment & Plan Note (Signed)
Patient currently maintained on 2 L of O2 with physical exertion Patient would like to qualify for a portable oxygen concentrator Walk today in office, patient did not have any oxygen desaturations on room air  Plan: Maintain oxygen saturations above 88% Did not qualify for POC today

## 2020-06-19 NOTE — Assessment & Plan Note (Signed)
Plan: Stop Breo Ellipta 100 Walk today in office Start Trelegy Ellipta Let us know in 2 weeks how you are doing on the Trelegy Ellipta sample then we can send in a prescription if you tolerate it Emphasized need to stop smoking 22-month follow-up with our office to establish care with Dr. Patsey Berthold

## 2020-06-19 NOTE — Assessment & Plan Note (Signed)
Current smoker Smoking half pack per day Previous reaction to Chantix Not willing to try nicotine replacement this time Willing to do reduce to quit meth  Plan: Work to reduce your overall cigarette consumption Goal to get to 0 cigarettes a day Emphasized need to stop smoking

## 2020-06-19 NOTE — Assessment & Plan Note (Signed)
Blood pressure elevated today  Plan: Encourage patient to follow back up with primary care

## 2020-06-19 NOTE — Progress Notes (Signed)
@Patient  ID: Crystal White, female    DOB: 1941-06-12, 79 y.o.   MRN: [id number]  Chief Complaint  Patient presents with  . Follow-up    Shortness of breath with exertion, Breo not lasting the whole day    Referring provider: Jerrol Banana.,*  HPI:  79 year old female current smoker followed in our office for COPD and chronic respiratory failure  PMH: Hypertension, anxiety, dizziness, dyslipidemia, neuropathy, Smoker/ Smoking History: Current smoker.  0.5 packs/day.  55-pack-year smoking history Maintenance: Breo Ellipta 100 Pt of: DR  06/19/2020  - Visit   79 year old female current everyday smoker followed in our office for dyspnea on exertion, COPD, chronic respiratory failure, and sleep apnea.  She is maintained on CPAP therapy.  She was last seen in 2019 by DR.  Patient presenting today as a follow-up.  Patient presenting as follow-up with our office today.  She is reporting that she does not feel like her Adair Patter is working anymore.  She does still continue to smoke 0.5 packs/day.  She was last seen by DR. She is still adherent to using her CPAP.  Her CPAP is over 64 years old though so she is not in [place].  She did not bring her ST card today.  She is hesitant to trying to stop smoking because she reports that it columns her nerves and this is been a lonely timeframe during the COVID-19 pandemic.  But she knows that she needs to quit.  She is interested in qualifying for a portable oxygen concentrator today.  Patient's DME is [facility].  Patient is hopeful to be able to travel to [facility] with her grandson.  We will evaluate this today.  Patient has received the Covid 19 vaccine already.    Questionaires / Pulmonary Flowsheets:   ACT:  No flowsheet data found.  MMRC: mMRC Dyspnea Scale mMRC Score  06/19/2020 3    Epworth:  No flowsheet data found.  Tests:   FENO:  No results found for: NITRICOXIDE  PFT: No flowsheet data found.  WALK:  SIX  MIN WALK 06/19/2020 11/15/2016 11/15/2016 06/10/2016  Medications - - - Gabapentin, Losartan, Protonix  Supplimental Oxygen during Test? (L/min) No Yes No No  O2 Flow Rate - 2 - -  Type - Continuous - -  Laps - - - 3  Partial Lap (in Meters) - - - 0  Baseline BP (sitting) - - - 140/84  Baseline Heartrate - - - 89  Baseline Dyspnea (Borg Scale) - - - 1  Baseline Fatigue (Borg Scale) - - - 3  Baseline SPO2 - - - 93  BP (sitting) - - - 182/88  Heartrate - - - 122  Dyspnea (Borg Scale) - - - 4  Fatigue (Borg Scale) - - - 4  SPO2 - - - 87  BP (sitting) - - - 144/88  Heartrate - - - 94  SPO2 - - - 95  Stopped or Paused before Six Minutes - - - Yes  Other Symptoms at end of Exercise - - - Increased SOb  Distance Completed - - - 144  Tech Comments: slow paced (due to injured knee cap) walk x3 laps, patient complained of shortness of breath on 3rd lap, took one rest period on the 3rd lap - - Pt started out with moderate pace. Got SOB and sats dropped to 87%. 2L O2 placed on pt and sats came up to 95%    Imaging: DG Knee Complete 4 Views Left  Result Date: 05/26/2020 CLINICAL DATA:  Pain status post fall on the concrete. EXAM: LEFT KNEE - COMPLETE 4+ VIEW COMPARISON:  None. FINDINGS: No evidence of fracture, dislocation, or joint effusion. No evidence of arthropathy or other focal bone abnormality. Soft tissues are unremarkable. IMPRESSION: Negative. Electronically Signed   By: Constance Holster M.D.   On: 05/26/2020 16:08   DG Knee Complete 4 Views Right  Result Date: 05/26/2020 CLINICAL DATA:  Pain status post fall EXAM: RIGHT KNEE - COMPLETE 4+ VIEW COMPARISON:  None. FINDINGS: There is an acute, minimally displaced transverse fracture through the lower pole of the patella. There is a large suprapatellar joint effusion with evidence for lipohemarthrosis. There is no dislocation or additional acute displaced fracture. IMPRESSION: 1. Acute, minimally displaced transverse fracture through the  lower pole of the patella. 2. Large suprapatellar joint effusion with lipohemarthrosis. Electronically Signed   By: Constance Holster M.D.   On: 05/26/2020 16:07    Lab Results:  CBC    Component Value Date/Time   WBC 9.4 02/13/2020 1642   WBC 26.3 (H) 05/27/2016 1045   RBC 4.83 02/13/2020 1642   RBC 5.19 05/27/2016 1045   HGB 15.2 02/13/2020 1642   HCT 44.7 02/13/2020 1642   PLT 188 02/13/2020 1642   MCV 93 02/13/2020 1642   MCV 87 12/24/2012 1108   MCH 31.5 02/13/2020 1642   MCH 28.4 05/27/2016 1045   MCHC 34.0 02/13/2020 1642   MCHC 32.9 05/27/2016 1045   RDW 13.7 02/13/2020 1642   RDW 14.7 (H) 12/24/2012 1108   LYMPHSABS 1.4 02/13/2020 1642   LYMPHSABS 1.4 12/24/2012 1108   MONOABS 1.4 (H) 05/26/2016 1136   MONOABS 0.5 12/24/2012 1108   EOSABS 0.2 02/13/2020 1642   EOSABS 0.1 12/24/2012 1108   BASOSABS 0.1 02/13/2020 1642   BASOSABS 0.0 12/24/2012 1108    BMET    Component Value Date/Time   NA 141 02/13/2020 1642   NA 141 12/24/2012 1108   K 4.9 02/13/2020 1642   K 3.9 12/24/2012 1108   CL 103 02/13/2020 1642   CL 101 12/24/2012 1108   CO2 25 02/13/2020 1642   CO2 29 12/24/2012 1108   GLUCOSE 88 02/13/2020 1642   GLUCOSE 117 (H) 05/27/2016 1045   GLUCOSE 112 (H) 12/24/2012 1108   BUN 14 02/13/2020 1642   BUN 19 (H) 12/24/2012 1108   CREATININE 1.32 (H) 02/13/2020 1642   CREATININE 1.36 (H) 12/24/2012 1108   CALCIUM 9.0 02/13/2020 1642   CALCIUM 9.6 12/24/2012 1108   GFRNONAA 39 (L) 02/13/2020 1642   GFRNONAA 39 (L) 12/24/2012 1108   GFRAA 45 (L) 02/13/2020 1642   GFRAA 46 (L) 12/24/2012 1108    BNP No results found for: BNP  ProBNP No results found for: PROBNP  Specialty Problems      Pulmonary Problems   COPD (chronic obstructive pulmonary disease) ([place])    01/2009 Arlyce Harman clear obstruction FEV1 1.89 L (52%) 01/2012 mMRC 2 01/2012 Smoked 1/2 ppd for 55 years 03/2012 6 MW >> 1170 ft, Peak HR 117, Peak O2 sat 92% RA 08/2012 6 MW >> 1250 ft, Peak  HR 103  93% RA      Sinus congestion   Apnea, sleep   Acute exacerbation of chronic obstructive airways disease (HCC)   Cough   Hemoptysis    04/29/2015 CT chest > emphysema bilaterally, scattered nodules 5-15mm in size, focal RLL pleural based consolidation/scar with mild surrounding ggo of undetermined significance, felt to be inflammatory  but can't rule out neoplasm      Multiple pulmonary nodules    07/2015 CT chest > multiple small pulmonary nodules, no larger than 49mm, RML lesion nearly completely resolved 02/02/2016 CT chest> multiple pulmonary nodules are stable      Acute bronchitis   Chronic respiratory failure (HCC)   Other emphysema (HCC)      Allergies  Allergen Reactions  . Iodinated Diagnostic Agents Other (See Comments)    Burning sensation during contrast media injections. Patient states the IV contrast makes her very hot. She has had multiple CT Scans with IV contrast and states she only gets the warm sensation. This is not an allergic reaction, but is noted in her [facility] chart as well. Aggie Hacker, ARRT R CT, had this discussion with her on 01/10/19 at 1130am.  . Statins Other (See Comments)    Reaction:  Leg cramps   . Bacitracin Swelling and Rash    Immunization History  Administered Date(s) Administered  . Influenza Split 09/20/2006, 09/02/2010, 09/10/2012  . Influenza Whole 08/28/2012  . Influenza, High Dose Seasonal PF 08/28/2014, 08/10/2015, 07/25/2016, 10/03/2017, 08/30/2018, 09/18/2019  . Influenza-Unspecified 08/28/2013  . Moderna SARS-COVID-2 Vaccination 01/09/2020, 02/06/2020  . Pneumococcal Conjugate-13 08/28/2014  . Pneumococcal Polysaccharide-23 03/07/2011  . Tdap 08/28/2014  . Zoster 11/04/2012    Past Medical History:  Diagnosis Date  . Arthritis    hands, lower back  . Barrett's esophagus   . COPD (chronic obstructive pulmonary disease) ([place])   . Emphysema lung ([place])   . GERD (gastroesophageal reflux disease)   . Hernia of  abdominal cavity   . Hyperlipidemia   . Hypertension   . Mobitz type II atrioventricular block   . OSA (obstructive sleep apnea)    on CPAP  . Peripheral neuropathy   . Presence of permanent cardiac pacemaker 09/22/11   [place] [facility])  . Shortness of breath dyspnea   . Stomach ulcer     Tobacco History: Social History   Tobacco Use  Smoking Status Current Every Day Smoker  . Packs/day: 1.00  . Years: 55.00  . Pack years: 55.00  . Types: Cigarettes  Smokeless Tobacco Never Used   Ready to quit: Yes Counseling given: Yes  Smoking assessment and cessation counseling  Patient currently smoking: 0.5 ppd I have advised the patient to quit/stop smoking as soon as possible due to high risk for multiple medical problems.  It will also be very difficult for [place] to manage patient's  respiratory symptoms and status if we continue to expose her lungs to a known irritant.  We do not advise e-cigarettes as a form of stopping smoking.  Patient is willing to quit smoking.  Patient is willing to reduce her smoking, patient is willing to exercise reduce to quit method.  We will discuss this today.  She declines nicotine replacement therapies.  Previous reaction to Chantix.  I have advised the patient that we can assist and have options of nicotine replacement therapy, provided smoking cessation education today, provided smoking cessation counseling, and provided cessation resources.  Follow-up next office visit office visit for assessment of smoking cessation.    Smoking cessation counseling advised for: 88min     Outpatient Encounter Medications as of 06/19/2020  Medication Sig  . acetaminophen (TYLENOL) 500 MG tablet Take 1,000 mg by mouth every 6 (six) hours as needed (pain/headaches.).   Marland Kitchen albuterol (PROVENTIL HFA) 108 (90 Base) MCG/ACT inhaler Inhale 2 puffs into the lungs every 6 (six)  hours as needed for wheezing or shortness of breath.  Marland Kitchen BREO ELLIPTA 100-25  MCG/INH AEPB USE 1 INHALATION BY MOUTH  DAILY  . citalopram (CELEXA) 20 MG tablet Take 1 tablet (20 mg total) by mouth daily. (Patient taking differently: Take 20 mg by mouth at bedtime. )  . ezetimibe (ZETIA) 10 MG tablet TAKE 1 TABLET BY MOUTH  DAILY  . gabapentin (NEURONTIN) 100 MG capsule TAKE 2 CAPSULES BY MOUTH 3  TIMES DAILY (Patient taking differently: Take 200 mg by mouth 3 (three) times daily. )  . hydrochlorothiazide (HYDRODIURIL) 25 MG tablet TAKE 1 TABLET BY MOUTH  DAILY  . HYDROcodone-acetaminophen (NORCO) 5-325 MG tablet Take 1 tablet by mouth every 6 (six) hours as needed.  . hyoscyamine (LEVBID) 0.375 MG 12 hr tablet Take 1 tablet (0.375 mg total) by mouth 2 (two) times daily as needed for cramping. Reported on 05/03/2016  . loperamide (IMODIUM A-D) 2 MG tablet Take 2 mg by mouth at bedtime.  Marland Kitchen losartan (COZAAR) 100 MG tablet Take 1 tablet (100 mg total) by mouth daily.  . meclizine (ANTIVERT) 25 MG tablet Take 1 tablet (25 mg total) by mouth 2 (two) times daily as needed for dizziness.  . mirtazapine (REMERON) 30 MG tablet TAKE 1 TABLET BY MOUTH AT  BEDTIME (Patient taking differently: Take 30 mg by mouth at bedtime. )  . OXYGEN Inhale into the lungs daily. Uses at night with CPAP and PRN during daytime  . pantoprazole (PROTONIX) 40 MG tablet TAKE 1 TABLET BY MOUTH  TWICE DAILY  . traZODone (DESYREL) 150 MG tablet TAKE 1 TABLET BY MOUTH AT  BEDTIME  . [DISCONTINUED] citalopram (CELEXA) 10 MG tablet TAKE 1 TABLET BY MOUTH AT  BEDTIME  . Fluticasone-Umeclidin-Vilant (TRELEGY ELLIPTA) 100-62.5-25 MCG/INH AEPB Inhale 1 puff into the lungs daily.   No facility-administered encounter medications on file as of 06/19/2020.     Review of Systems  Review of Systems  Constitutional: Positive for fatigue. Negative for activity change and fever.  HENT: Negative for sinus pressure, sinus pain and sore throat.   Respiratory: Positive for cough and shortness of breath. Negative for wheezing.    Cardiovascular: Negative for chest pain and palpitations.  Gastrointestinal: Negative for diarrhea, nausea and vomiting.  Musculoskeletal: Negative for arthralgias.  Neurological: Negative for dizziness.  Psychiatric/Behavioral: Negative for sleep disturbance. The patient is not nervous/anxious.      Physical Exam  BP (!) 166/82 (BP Location: Right Arm, Cuff Size: Normal)   Pulse 97   Temp 98 F (36.7 C) (Oral)   Ht 5' 4.5" (1.638 m)   Wt 143 lb 12.8 oz (65.2 kg)   SpO2 92% Comment: Room air  BMI 24.30 kg/m   Wt Readings from Last 5 Encounters:  06/19/20 143 lb 12.8 oz (65.2 kg)  05/26/20 147 lb 0.8 oz (66.7 kg)  04/30/20 147 lb (66.7 kg)  02/13/20 152 lb (68.9 kg)  01/22/20 145 lb (65.8 kg)    BMI Readings from Last 5 Encounters:  06/19/20 24.30 kg/m  05/26/20 25.24 kg/m  04/30/20 25.23 kg/m  02/13/20 26.09 kg/m  01/22/20 24.50 kg/m     Physical Exam Vitals and nursing note reviewed.  Constitutional:      General: She is not in acute distress.    Appearance: Normal appearance. She is normal weight.  HENT:     Head: Normocephalic and atraumatic.     Right Ear: External ear normal.     Left Ear: External ear normal.  Nose: Nose normal. No congestion.     Mouth/Throat:     Mouth: Mucous membranes are moist.     Pharynx: Oropharynx is clear.  Eyes:     Pupils: Pupils are equal, round, and reactive to light.  Cardiovascular:     Rate and Rhythm: Normal rate and regular rhythm.     Pulses: Normal pulses.     Heart sounds: Normal heart sounds. No murmur heard.   Pulmonary:     Breath sounds: No decreased air movement. No decreased breath sounds, wheezing or rales.     Comments: Diminished breath sounds that exam Musculoskeletal:     Cervical back: Normal range of motion.  Skin:    General: Skin is warm and dry.     Capillary Refill: Capillary refill takes less than 2 seconds.  Neurological:     General: No focal deficit present.     Mental  Status: She is alert and oriented to person, place, and time. Mental status is at baseline.     Gait: Gait (Tolerated walk in office today, walked at slow pace) normal.  Psychiatric:        Mood and Affect: Mood normal.        Behavior: Behavior normal.        Thought Content: Thought content normal.        Judgment: Judgment normal.       Assessment & Plan:   Essential (primary) hypertension Blood pressure elevated today  Plan: Encourage patient to follow back up with primary care  Apnea, sleep Plan: Continue CPAP therapy Bring SD card to next office visit so we can check compliance  Chronic respiratory failure Patient currently maintained on 2 L of O2 with physical exertion Patient would like to qualify for a portable oxygen concentrator Walk today in office, patient did not have any oxygen desaturations on room air  Plan: Maintain oxygen saturations above 88% Did not qualify for POC today  COPD (chronic obstructive pulmonary disease) ([place]) Plan: Stop Breo Ellipta 100 Walk today in office Start Trelegy Ellipta Let us know in 2 weeks how you are doing on the Trelegy Ellipta sample then we can send in a prescription if you tolerate it Emphasized need to stop smoking 63-month follow-up with our office to establish care with Dr. Patsey Berthold  Tobacco abuse Current smoker Smoking half pack per day Previous reaction to Chantix Not willing to try nicotine replacement this time Willing to do reduce to quit meth  Plan: Work to reduce your overall cigarette consumption Goal to get to 0 cigarettes a day Emphasized need to stop smoking    Return in about 2 months (around 08/20/2020), or if symptoms worsen or fail to improve, for Follow up with Wyn Quaker FNP-C, [facility] - Dr. Patsey Berthold.   Lauraine Rinne, NP 06/19/2020   This appointment required 25 minutes of patient care (this includes precharting, chart review, review of results, face-to-face care, etc.).

## 2020-06-24 NOTE — Progress Notes (Signed)
Prior patient of Dr. Ashby Dawes.  I have reviewed Wyn Quaker, NP's evaluation and plan.  Agree with the assessment and plan as noted below.

## 2020-07-01 ENCOUNTER — Other Ambulatory Visit: Payer: Self-pay | Admitting: Family Medicine

## 2020-07-01 DIAGNOSIS — S82044D Nondisplaced comminuted fracture of right patella, subsequent encounter for closed fracture with routine healing: Secondary | ICD-10-CM | POA: Diagnosis not present

## 2020-07-01 DIAGNOSIS — M25561 Pain in right knee: Secondary | ICD-10-CM | POA: Diagnosis not present

## 2020-07-01 DIAGNOSIS — G8929 Other chronic pain: Secondary | ICD-10-CM | POA: Diagnosis not present

## 2020-07-02 NOTE — Telephone Encounter (Signed)
Requested  medications are  due for refill today yes  Requested medications are on the active medication list yes  Last refill 6/10 (Mail order)  Future visit scheduled Sept 2021  Notes to clinic Failed protocol due to visit addressing med/dx more than 6 months ago.

## 2020-07-07 DIAGNOSIS — L821 Other seborrheic keratosis: Secondary | ICD-10-CM | POA: Diagnosis not present

## 2020-07-07 DIAGNOSIS — D225 Melanocytic nevi of trunk: Secondary | ICD-10-CM | POA: Diagnosis not present

## 2020-07-07 DIAGNOSIS — D1801 Hemangioma of skin and subcutaneous tissue: Secondary | ICD-10-CM | POA: Diagnosis not present

## 2020-07-07 DIAGNOSIS — Z859 Personal history of malignant neoplasm, unspecified: Secondary | ICD-10-CM | POA: Diagnosis not present

## 2020-07-07 DIAGNOSIS — L578 Other skin changes due to chronic exposure to nonionizing radiation: Secondary | ICD-10-CM | POA: Diagnosis not present

## 2020-07-07 DIAGNOSIS — L570 Actinic keratosis: Secondary | ICD-10-CM | POA: Diagnosis not present

## 2020-07-07 DIAGNOSIS — D485 Neoplasm of uncertain behavior of skin: Secondary | ICD-10-CM | POA: Diagnosis not present

## 2020-07-13 DIAGNOSIS — J449 Chronic obstructive pulmonary disease, unspecified: Secondary | ICD-10-CM | POA: Diagnosis not present

## 2020-07-14 DIAGNOSIS — I442 Atrioventricular block, complete: Secondary | ICD-10-CM | POA: Diagnosis not present

## 2020-07-14 DIAGNOSIS — I10 Essential (primary) hypertension: Secondary | ICD-10-CM | POA: Diagnosis not present

## 2020-07-14 DIAGNOSIS — E785 Hyperlipidemia, unspecified: Secondary | ICD-10-CM | POA: Diagnosis not present

## 2020-07-14 DIAGNOSIS — Z950 Presence of cardiac pacemaker: Secondary | ICD-10-CM | POA: Diagnosis not present

## 2020-07-14 DIAGNOSIS — I739 Peripheral vascular disease, unspecified: Secondary | ICD-10-CM | POA: Diagnosis not present

## 2020-07-22 DIAGNOSIS — S82044D Nondisplaced comminuted fracture of right patella, subsequent encounter for closed fracture with routine healing: Secondary | ICD-10-CM | POA: Diagnosis not present

## 2020-07-29 ENCOUNTER — Telehealth: Payer: Self-pay

## 2020-07-29 NOTE — Telephone Encounter (Signed)
Copied from [place] [id number]. Topic: Referral - Request for Referral >> Jul 29, 2020  3:37 PM Hinda Lenis D wrote: PT need a request send for a new CPAP machine / please advise  [facility] [email or web address] ... > [place] > [place] [facility] is a medical supply company in [place], [place]. Call [facility] at [phone]

## 2020-07-30 NOTE — Telephone Encounter (Signed)
Order was faxed to [facility].

## 2020-08-10 NOTE — Progress Notes (Signed)
I,Crystal White,acting as a scribe for Crystal Durie, MD.,have documented all relevant documentation on the behalf of Crystal Durie, MD,as directed by  Crystal Durie, MD while in the presence of Crystal Durie, MD.   Complete physical exam   Patient: Crystal White   DOB: 02-18-1941   79 y.o. Female  MRN: [id number] Visit Date: 08/11/2020  Today's healthcare provider: [name], MD   Chief Complaint  Patient presents with   Annual Exam   Subjective    Crystal White is a 79 y.o. female who presents today for a complete physical exam.  She reports consuming a general diet. The patient does not participate in regular exercise at present. She generally feels poorly. She reports sleeping fairly well. She does have additional problems to discuss today.  HPI  Patient continues to smoke.  She knows she needs to quit.  She supposed to have a home bone density done for [place] [facility]..  Patient had AWV with [facility] on 04/28/2020.  Past Medical History:  Diagnosis Date   Arthritis    hands, lower back   Barrett's esophagus    COPD (chronic obstructive pulmonary disease) (HCC)    Emphysema lung (HCC)    GERD (gastroesophageal reflux disease)    Hernia of abdominal cavity    Hyperlipidemia    Hypertension    Mobitz type II atrioventricular block    OSA (obstructive sleep apnea)    on CPAP   Peripheral neuropathy    Presence of permanent cardiac pacemaker 09/22/11   Biotronik - EVIADR-T [facility])   Shortness of breath dyspnea    Stomach ulcer    Past Surgical History:  Procedure Laterality Date   APPENDECTOMY  2006   Dr. Gae Dry LIFT Bilateral 05/03/2016   Procedure: BLEPHAROPLASTY  BILATERAL UPPER EYELIDS WITH EXCESS SKIN REMOVAL BILATERAL BROW PTOSIS REPAIR BLEPHAROPTOSIS REPAIR BILATERAL EYES ;  Surgeon: Karle Starch, MD;  Location: [place];  Service: Ophthalmology;  Laterality: Bilateral;   CARDIAC CATHETERIZATION   2007   COLON SURGERY     COLONOSCOPY WITH PROPOFOL N/A 04/14/2015   Procedure: COLONOSCOPY WITH PROPOFOL;  Surgeon: Lollie Sails, MD;  Location: [facility] ENDOSCOPY;  Service: Endoscopy;  Laterality: N/A;   COLONOSCOPY WITH PROPOFOL N/A 01/24/2019   Procedure: COLONOSCOPY WITH PROPOFOL;  Surgeon: Lollie Sails, MD;  Location: [facility] ENDOSCOPY;  Service: Endoscopy;  Laterality: N/A;   COLONOSCOPY WITH PROPOFOL N/A 06/11/2019   Procedure: COLONOSCOPY WITH PROPOFOL;  Surgeon: Lollie Sails, MD;  Location: [facility] ENDOSCOPY;  Service: Endoscopy;  Laterality: N/A;   ESOPHAGOGASTRODUODENOSCOPY N/A 04/14/2015   Procedure: ESOPHAGOGASTRODUODENOSCOPY (EGD);  Surgeon: Lollie Sails, MD;  Location: [facility] ENDOSCOPY;  Service: Endoscopy;  Laterality: N/A;   ESOPHAGOGASTRODUODENOSCOPY (EGD) WITH PROPOFOL N/A 07/05/2017   Procedure: ESOPHAGOGASTRODUODENOSCOPY (EGD) WITH PROPOFOL;  Surgeon: Manya Silvas, MD;  Location: [facility] ENDOSCOPY;  Service: Endoscopy;  Laterality: N/A;   ESOPHAGOGASTRODUODENOSCOPY (EGD) WITH PROPOFOL N/A 01/24/2019   Procedure: ESOPHAGOGASTRODUODENOSCOPY (EGD) WITH PROPOFOL;  Surgeon: Lollie Sails, MD;  Location: [facility] ENDOSCOPY;  Service: Endoscopy;  Laterality: N/A;   HERNIA REPAIR     HIATAL HERNIA REPAIR  2007   INSERT / REPLACE / REMOVE PACEMAKER     LAPAROSCOPIC RIGHT HEMI COLECTOMY Right 06/22/2015   Procedure: LAPAROSCOPIC RIGHT HEMI COLECTOMY;  Surgeon: Marlyce Huge, MD;  Location: [facility] ORS;  Service: General;  Laterality: Right;   NISSEN FUNDOPLICATION  6314   OPEN REDUCTION INTERNAL  FIXATION (ORIF) DISTAL RADIAL FRACTURE Left 08/22/2019   Procedure: OPEN REDUCTION INTERNAL FIXATION (ORIF) DISTAL RADIAL FRACTURE;  Surgeon: Hessie Knows, MD;  Location: [facility] ORS;  Service: Orthopedics;  Laterality: Left;   PACEMAKER INSERTION  09/22/11   [facility]  - Biotronik EVIADR-T   TOTAL ABDOMINAL HYSTERECTOMY  1980   Social History   Socioeconomic History     Marital status: Widowed    Spouse name: widowed   Number of children: 2   Years of education: Not on file   Highest education level: Some college, no degree  Occupational History   Occupation: retired Scientist, research (life sciences) estate  Tobacco Use   Smoking status: Current Every Day Smoker    Packs/day: 1.00    Years: 55.00    Pack years: 55.00    Types: Cigarettes   Smokeless tobacco: Never Used  Scientific laboratory technician Use: Never used  Substance and Sexual Activity   Alcohol use: Yes    Alcohol/week: 0.0 standard drinks    Comment: 1/2 glass wine -rare   Drug use: No   Sexual activity: Never  Other Topics Concern   Not on file  Social History Narrative   Pt was adopted.    Lives at home by herself.   Not on home oxygen.   Social Determinants of Health   Financial Resource Strain: Low Risk    Difficulty of Paying Living Expenses: Not hard at all  Food Insecurity: No Food Insecurity   Worried About Charity fundraiser in the Last Year: Never true   [place] in the Last Year: Never true  Transportation Needs: No Transportation Needs   Lack of Transportation (Medical): No   Lack of Transportation (Non-Medical): No  Physical Activity: Inactive   Days of Exercise per Week: 0 days   Minutes of Exercise per Session: 0 min  Stress: No Stress Concern Present   Feeling of Stress : Not at all  Social Connections: Socially Isolated   Frequency of Communication with Friends and Family: More than three times a week   Frequency of Social Gatherings with Friends and Family: More than three times a week   Attends Religious Services: Never   Marine scientist or Organizations: No   Attends Archivist Meetings: Never   Marital Status: Widowed  Human resources officer Violence: Not At Risk   Fear of Current or Ex-Partner: No   Emotionally Abused: No   Physically Abused: No   Sexually Abused: No   Family Status  Relation Name Status   Mother  Deceased    Father  Deceased   Daughter  Alive   Son  Alive   Family History  Adopted: Yes  Problem Relation Age of Onset   Diabetes Son    Allergies  Allergen Reactions   Iodinated Diagnostic Agents Other (See Comments)    Burning sensation during contrast media injections. Patient states the IV contrast makes her very hot. She has had multiple CT Scans with IV contrast and states she only gets the warm sensation. This is not an allergic reaction, but is noted in her [place] chart as well. Aggie Hacker, ARRT R CT, had this discussion with her on 01/10/19 at 1130am.   Statins Other (See Comments)    Reaction:  Leg cramps    Bacitracin Swelling and Rash    Patient Care Team: Jerrol Banana., MD as PCP - General (Unknown Physician Specialty) Samara Deist, DPM as Referring Physician (Podiatry) Paraschos, Sheppard Coil,  MD as Consulting Physician (Cardiology)   Medications: Outpatient Medications Prior to Visit  Medication Sig   acetaminophen (TYLENOL) 500 MG tablet Take 1,000 mg by mouth every 6 (six) hours as needed (pain/headaches.).    albuterol (PROVENTIL HFA) 108 (90 Base) MCG/ACT inhaler Inhale 2 puffs into the lungs every 6 (six) hours as needed for wheezing or shortness of breath.   BREO ELLIPTA 100-25 MCG/INH AEPB USE 1 INHALATION BY MOUTH  DAILY   citalopram (CELEXA) 20 MG tablet Take 1 tablet (20 mg total) by mouth daily. (Patient taking differently: Take 20 mg by mouth at bedtime. )   ezetimibe (ZETIA) 10 MG tablet TAKE 1 TABLET BY MOUTH  DAILY   Fluticasone-Umeclidin-Vilant (TRELEGY ELLIPTA) 100-62.5-25 MCG/INH AEPB Inhale 1 puff into the lungs daily.   gabapentin (NEURONTIN) 100 MG capsule TAKE 2 CAPSULES BY MOUTH 3  TIMES DAILY   hydrochlorothiazide (HYDRODIURIL) 25 MG tablet TAKE 1 TABLET BY MOUTH  DAILY   HYDROcodone-acetaminophen (NORCO) 5-325 MG tablet Take 1 tablet by mouth every 6 (six) hours as needed.   hyoscyamine (LEVBID) 0.375 MG 12 hr tablet  Take 1 tablet (0.375 mg total) by mouth 2 (two) times daily as needed for cramping. Reported on 05/03/2016   loperamide (IMODIUM A-D) 2 MG tablet Take 2 mg by mouth at bedtime.   losartan (COZAAR) 100 MG tablet Take 1 tablet (100 mg total) by mouth daily.   meclizine (ANTIVERT) 25 MG tablet Take 1 tablet (25 mg total) by mouth 2 (two) times daily as needed for dizziness.   mirtazapine (REMERON) 30 MG tablet TAKE 1 TABLET BY MOUTH AT  BEDTIME   OXYGEN Inhale into the lungs daily. Uses at night with CPAP and PRN during daytime   pantoprazole (PROTONIX) 40 MG tablet TAKE 1 TABLET BY MOUTH  TWICE DAILY   traZODone (DESYREL) 150 MG tablet TAKE 1 TABLET BY MOUTH AT  BEDTIME   No facility-administered medications prior to visit.    Review of Systems  Respiratory: Positive for apnea, cough, shortness of breath and wheezing.   Gastrointestinal: Positive for vomiting.  Genitourinary: Positive for frequency.  Musculoskeletal: Positive for arthralgias, back pain, gait problem, neck pain and neck stiffness.  All other systems reviewed and are negative.      Objective    BP (!) 173/73 (BP Location: Right Arm, Cuff Size: Normal)    Pulse 66    Temp 97.9 F (36.6 C) (Oral)    Resp 18    Ht 5\' 5"  (1.651 m)    Wt 146 lb (66.2 kg)    SpO2 93%    BMI 24.30 kg/m  BP Readings from Last 3 Encounters:  08/11/20 (!) 173/73  06/19/20 (!) 166/82  05/26/20 121/69   Wt Readings from Last 3 Encounters:  08/11/20 146 lb (66.2 kg)  06/19/20 143 lb 12.8 oz (65.2 kg)  05/26/20 147 lb 0.8 oz (66.7 kg)      Physical Exam Vitals reviewed.  Constitutional:      Appearance: She is well-developed.  HENT:     Head: Normocephalic and atraumatic.     Right Ear: External ear normal.     Left Ear: External ear normal.     Nose: Nose normal.  Eyes:     Conjunctiva/sclera: Conjunctivae normal.     Pupils: Pupils are equal, round, and reactive to light.  Cardiovascular:     Rate and Rhythm: Normal rate and  regular rhythm.     Heart sounds: Normal heart sounds.  Pulmonary:     Effort: Pulmonary effort is normal.     Breath sounds: Normal breath sounds.  Abdominal:     General: Bowel sounds are normal.     Palpations: Abdomen is soft.  Musculoskeletal:        General: Normal range of motion.     Cervical back: Normal range of motion and neck supple.  Skin:    General: Skin is warm and dry.  Neurological:     Mental Status: She is alert and oriented to person, place, and time.     Deep Tendon Reflexes: Reflexes are normal and symmetric.  Psychiatric:        Behavior: Behavior normal.        Thought Content: Thought content normal.        Judgment: Judgment normal.                                                                  Last depression screening scores PHQ 2/9 Scores 04/28/2020 04/23/2019 10/19/2018  PHQ - 2 Score 0 0 0  PHQ- 9 Score - - -   Last fall risk screening Fall Risk  04/28/2020  Falls in the past year? 1  Number falls in past yr: 1  Injury with Fall? 1  Risk for fall due to : Impaired balance/gait  Follow up Falls prevention discussed  Comment Offered PT order to build strength an assist with balance issues. Pt declined order.   Last Audit-C alcohol use screening Alcohol Use Disorder Test (AUDIT) 04/28/2020  1. How often do you have a drink containing alcohol? 1  2. How many drinks containing alcohol do you have on a typical day when you are drinking? 0  3. How often do you have six or more drinks on one occasion? 0  AUDIT-C Score 1  Alcohol Brief Interventions/Follow-up AUDIT Score <7 follow-up not indicated   A score of 3 or more in women, and 4 or more in men indicates increased risk for alcohol abuse, EXCEPT if all of the points are from question 1   No results found for any visits on 08/11/20.  Assessment & Plan    Routine Health Maintenance and Physical Exam  Exercise Activities and Dietary recommendations Goals     Cut out  night time snacks     Recommend cutting out all snacks after 7 PM at night.      DIET - REDUCE FAT INTAKE     Recommend cutting fat intake in half to help aid in weight loss.        Immunization History  Administered Date(s) Administered   Fluad Quad(high Dose 65+) 08/11/2020   Influenza Split 09/20/2006, 09/02/2010, 09/10/2012   Influenza Whole 08/28/2012   Influenza, High Dose Seasonal PF 08/28/2014, 08/10/2015, 07/25/2016, 10/03/2017, 08/30/2018, 09/18/2019   Influenza-Unspecified 08/28/2013   Moderna SARS-COVID-2 Vaccination 01/09/2020, 02/06/2020   Pneumococcal Conjugate-13 08/28/2014   Pneumococcal Polysaccharide-23 03/07/2011   Tdap 08/28/2014   Zoster 11/04/2012    Health Maintenance  Topic Date Due   Hepatitis C Screening  Never done   DEXA SCAN  03/10/2018   COLONOSCOPY  06/10/2022   TETANUS/TDAP  08/28/2024   INFLUENZA VACCINE  Completed   COVID-19 Vaccine  Completed   PNA vac Low Risk  Adult  Completed    Discussed health benefits of physical activity, and encouraged her to engage in regular exercise appropriate for her age and condition.  1. Annual physical exam  - Lipid panel - CBC w/Diff/Platelet - Comprehensive Metabolic Panel (CMET) - TSH  2. Need for influenza vaccination  - Flu Vaccine QUAD High Dose(Fluad)  3. Essential (primary) hypertension Consider adding CCB or hydralazine. - Lipid panel - CBC w/Diff/Platelet - Comprehensive Metabolic Panel (CMET) - TSH  4. Hypercholesteremia On Zetia - Lipid panel - CBC w/Diff/Platelet - Comprehensive Metabolic Panel (CMET) - TSH  5. Chronic obstructive pulmonary disease, unspecified COPD type ([place]) Patient advised to quit smoking - Lipid panel - CBC w/Diff/Platelet - Comprehensive Metabolic Panel (CMET) - TSH  6. Gastroesophageal reflux disease with esophagitis, unspecified whether hemorrhage  - Lipid panel - CBC w/Diff/Platelet - Comprehensive Metabolic Panel (CMET) -  TSH  7. Fatigue, unspecified type  - Lipid panel - CBC w/Diff/Platelet - Comprehensive Metabolic Panel (CMET) - TSH 8.OSA CPAP at 9cm   Return in about 4 months (around 12/11/2020).     I, Crystal Durie, MD, have reviewed all documentation for this visit. The documentation on 08/16/20 for the exam, diagnosis, procedures, and orders are all accurate and complete.    Richard Cranford Mon, MD  [facility] [phone] (phone) [phone] (fax)  [place]

## 2020-08-11 ENCOUNTER — Encounter: Payer: Self-pay | Admitting: Family Medicine

## 2020-08-11 ENCOUNTER — Ambulatory Visit (INDEPENDENT_AMBULATORY_CARE_PROVIDER_SITE_OTHER): Payer: Medicare Other | Admitting: Family Medicine

## 2020-08-11 ENCOUNTER — Other Ambulatory Visit: Payer: Self-pay

## 2020-08-11 VITALS — BP 173/73 | HR 66 | Temp 97.9°F | Resp 18 | Ht 65.0 in | Wt 146.0 lb

## 2020-08-11 DIAGNOSIS — Z0000 Encounter for general adult medical examination without abnormal findings: Secondary | ICD-10-CM

## 2020-08-11 DIAGNOSIS — I10 Essential (primary) hypertension: Secondary | ICD-10-CM | POA: Diagnosis not present

## 2020-08-11 DIAGNOSIS — Z23 Encounter for immunization: Secondary | ICD-10-CM | POA: Diagnosis not present

## 2020-08-11 DIAGNOSIS — R5383 Other fatigue: Secondary | ICD-10-CM

## 2020-08-11 DIAGNOSIS — E7800 Pure hypercholesterolemia, unspecified: Secondary | ICD-10-CM

## 2020-08-11 DIAGNOSIS — J449 Chronic obstructive pulmonary disease, unspecified: Secondary | ICD-10-CM | POA: Diagnosis not present

## 2020-08-11 DIAGNOSIS — K2100 Gastro-esophageal reflux disease with esophagitis, without bleeding: Secondary | ICD-10-CM

## 2020-08-12 LAB — COMPREHENSIVE METABOLIC PANEL
ALT: 6 IU/L (ref 0–32)
AST: 9 IU/L (ref 0–40)
Albumin/Globulin Ratio: 1.7 (ref 1.2–2.2)
Albumin: 4.3 g/dL (ref 3.7–4.7)
Alkaline Phosphatase: 89 IU/L (ref 44–121)
BUN/Creatinine Ratio: 19 (ref 12–28)
BUN: 34 mg/dL — ABNORMAL HIGH (ref 8–27)
Bilirubin Total: 0.3 mg/dL (ref 0.0–1.2)
CO2: 24 mmol/L (ref 20–29)
Calcium: 9 mg/dL (ref 8.7–10.3)
Chloride: 101 mmol/L (ref 96–106)
Creatinine, Ser: 1.78 mg/dL — ABNORMAL HIGH (ref 0.57–1.00)
GFR calc Af Amer: 31 mL/min/{1.73_m2} — ABNORMAL LOW (ref >59)
GFR calc non Af Amer: 27 mL/min/{1.73_m2} — ABNORMAL LOW (ref >59)
Globulin, Total: 2.6 g/dL (ref 1.5–4.5)
Glucose: 86 mg/dL (ref 65–99)
Potassium: 4.1 mmol/L (ref 3.5–5.2)
Sodium: 142 mmol/L (ref 134–144)
Total Protein: 6.9 g/dL (ref 6.0–8.5)

## 2020-08-12 LAB — CBC WITH DIFFERENTIAL/PLATELET
Basophils Absolute: 0.1 10*3/uL (ref 0.0–0.2)
Basos: 1 %
EOS (ABSOLUTE): 0.1 10*3/uL (ref 0.0–0.4)
Eos: 1 %
Hematocrit: 46.5 % (ref 34.0–46.6)
Hemoglobin: 15.3 g/dL (ref 11.1–15.9)
Immature Grans (Abs): 0 10*3/uL (ref 0.0–0.1)
Immature Granulocytes: 0 %
Lymphocytes Absolute: 0.8 10*3/uL (ref 0.7–3.1)
Lymphs: 9 %
MCH: 30.9 pg (ref 26.6–33.0)
MCHC: 32.9 g/dL (ref 31.5–35.7)
MCV: 94 fL (ref 79–97)
Monocytes Absolute: 0.6 10*3/uL (ref 0.1–0.9)
Monocytes: 6 %
Neutrophils Absolute: 7.8 10*3/uL — ABNORMAL HIGH (ref 1.4–7.0)
Neutrophils: 83 %
Platelets: 197 10*3/uL (ref 150–450)
RBC: 4.95 x10E6/uL (ref 3.77–5.28)
RDW: 13.7 % (ref 11.7–15.4)
WBC: 9.4 10*3/uL (ref 3.4–10.8)

## 2020-08-12 LAB — LIPID PANEL
Chol/HDL Ratio: 4.5 ratio — ABNORMAL HIGH (ref 0.0–4.4)
Cholesterol, Total: 232 mg/dL — ABNORMAL HIGH (ref 100–199)
HDL: 52 mg/dL (ref >39)
LDL Chol Calc (NIH): 148 mg/dL — ABNORMAL HIGH (ref 0–99)
Triglycerides: 180 mg/dL — ABNORMAL HIGH (ref 0–149)
VLDL Cholesterol Cal: 32 mg/dL (ref 5–40)

## 2020-08-12 LAB — TSH: TSH: 1.16 u[IU]/mL (ref 0.450–4.500)

## 2020-08-13 DIAGNOSIS — J449 Chronic obstructive pulmonary disease, unspecified: Secondary | ICD-10-CM | POA: Diagnosis not present

## 2020-08-18 ENCOUNTER — Ambulatory Visit: Payer: Medicare Other | Admitting: Pulmonary Disease

## 2020-08-18 ENCOUNTER — Encounter: Payer: Self-pay | Admitting: Pulmonary Disease

## 2020-08-18 ENCOUNTER — Other Ambulatory Visit: Payer: Self-pay

## 2020-08-18 VITALS — BP 158/82 | HR 96 | Temp 97.3°F | Ht 64.0 in | Wt 146.2 lb

## 2020-08-18 DIAGNOSIS — J449 Chronic obstructive pulmonary disease, unspecified: Secondary | ICD-10-CM | POA: Diagnosis not present

## 2020-08-18 DIAGNOSIS — F17210 Nicotine dependence, cigarettes, uncomplicated: Secondary | ICD-10-CM

## 2020-08-18 DIAGNOSIS — J9611 Chronic respiratory failure with hypoxia: Secondary | ICD-10-CM

## 2020-08-18 DIAGNOSIS — G4733 Obstructive sleep apnea (adult) (pediatric): Secondary | ICD-10-CM

## 2020-08-18 MED ORDER — SPIRIVA RESPIMAT 2.5 MCG/ACT IN AERS: 2.0000 | INHALATION_SPRAY | Freq: Every day | RESPIRATORY_TRACT | 0 refills | Status: DC

## 2020-08-18 NOTE — Progress Notes (Signed)
Subjective:    Patient ID: Crystal White, female    DOB: 07/28/1941, 79 y.o.   MRN: [id number]  HPI Crystal White is a 79 year old current smoker (1 PPD) prior patient of Dr. Ashby Dawes who I am seeing for the first time and assuming care after Dr. Mathis Fare departure.  She last saw Dr. Ashby Dawes in November 2019 she was following on COPD due to emphysema with chronic hypoxic respiratory failure, multiple lung nodules, and sleep apnea on CPAP at 11 cm water pressure and oxygen bled in.  Follow-up until July 2021 when she saw Wyn Quaker, NP at our office. Since her visit in July she has had issues with her CPAP and that the unit quit working, she is having this replaced however due to the multiple recalls on [place] equipment she is having to wait to have this replaced.  She continues to use oxygen at 2 L/min with exertion and bled in with her CPAP during sleep.  She is compliant with her oxygen.  She has not had any fevers, chills or sweats.  No cough or sputum production.  During her July visit she had Trelegy added to her regimen, she could not afford the medication and actually did not note much change.  She still complains that Breo nor Trelegy lasted her through the day.  She would benefit from a medication such as Judithann Sauger that is given twice a day however she just recently got a shipment of Breo and would like to utilize that before switching.  We discussed strategies to discontinue smoking.  We also discussed the fact that is never too late to quit smoking.   She is up-to-date on COVID-19 vaccine.  She is planning a trip to [facility] with her grandson in mid October.  Review of Systems A 10 point review of systems was performed and it is as noted above otherwise negative.  Allergies  Allergen Reactions  . Iodinated Diagnostic Agents Other (See Comments)    Burning sensation during contrast media injections. Patient states the IV contrast makes her very hot. She has had multiple  CT Scans with IV contrast and states she only gets the warm sensation. This is not an allergic reaction, but is noted in her [place] chart as well. Aggie Hacker, ARRT R CT, had this discussion with her on 01/10/19 at 1130am.  . Statins Other (See Comments)    Reaction:  Leg cramps   . Bacitracin Swelling and Rash   Current Meds  Medication Sig  . acetaminophen (TYLENOL) 500 MG tablet Take 1,000 mg by mouth every 6 (six) hours as needed (pain/headaches.).   Marland Kitchen albuterol (PROVENTIL HFA) 108 (90 Base) MCG/ACT inhaler Inhale 2 puffs into the lungs every 6 (six) hours as needed for wheezing or shortness of breath.  Marland Kitchen BREO ELLIPTA 100-25 MCG/INH AEPB USE 1 INHALATION BY MOUTH  DAILY  . citalopram (CELEXA) 20 MG tablet Take 1 tablet (20 mg total) by mouth daily. (Patient taking differently: Take 20 mg by mouth at bedtime. )  . ezetimibe (ZETIA) 10 MG tablet TAKE 1 TABLET BY MOUTH  DAILY  . gabapentin (NEURONTIN) 100 MG capsule TAKE 2 CAPSULES BY MOUTH 3  TIMES DAILY  . hydrochlorothiazide (HYDRODIURIL) 25 MG tablet TAKE 1 TABLET BY MOUTH  DAILY  . HYDROcodone-acetaminophen (NORCO) 5-325 MG tablet Take 1 tablet by mouth every 6 (six) hours as needed.  . hyoscyamine (LEVBID) 0.375 MG 12 hr tablet Take 1 tablet (0.375 mg total) by mouth 2 (two) times daily  as needed for cramping. Reported on 05/03/2016  . loperamide (IMODIUM A-D) 2 MG tablet Take 2 mg by mouth at bedtime.  Marland Kitchen losartan (COZAAR) 100 MG tablet Take 1 tablet (100 mg total) by mouth daily.  . meclizine (ANTIVERT) 25 MG tablet Take 1 tablet (25 mg total) by mouth 2 (two) times daily as needed for dizziness.  . mirtazapine (REMERON) 30 MG tablet TAKE 1 TABLET BY MOUTH AT  BEDTIME  . OXYGEN Inhale into the lungs daily. Uses at night with CPAP and PRN during daytime  . pantoprazole (PROTONIX) 40 MG tablet TAKE 1 TABLET BY MOUTH  TWICE DAILY  . traZODone (DESYREL) 150 MG tablet TAKE 1 TABLET BY MOUTH AT  BEDTIME  . [DISCONTINUED]  Fluticasone-Umeclidin-Vilant (TRELEGY ELLIPTA) 100-62.5-25 MCG/INH AEPB Inhale 1 puff into the lungs daily.   Immunization History  Administered Date(s) Administered  . Fluad Quad(high Dose 65+) 08/11/2020  . Influenza Split 09/20/2006, 09/02/2010, 09/10/2012  . Influenza Whole 08/28/2012  . Influenza, High Dose Seasonal PF 08/28/2014, 08/10/2015, 07/25/2016, 10/03/2017, 08/30/2018, 09/18/2019  . Influenza-Unspecified 08/28/2013  . Moderna SARS-COVID-2 Vaccination 01/09/2020, 02/06/2020  . Pneumococcal Conjugate-13 08/28/2014  . Pneumococcal Polysaccharide-23 03/07/2011  . Tdap 08/28/2014  . Zoster 11/04/2012   Social History   Tobacco Use  . Smoking status: Current Every Day Smoker    Packs/day: 1.00    Years: 55.00    Pack years: 55.00    Types: Cigarettes  . Smokeless tobacco: Never Used  Substance Use Topics  . Alcohol use: Yes    Alcohol/week: 0.0 standard drinks    Comment: 1/2 glass wine -rare       Objective:   Physical Exam BP (!) 158/82 (BP Location: Left Arm, Cuff Size: Normal)   Pulse 96   Temp (!) 97.3 F (36.3 C) (Temporal)   Ht 5\' 4"  (1.626 m)   Wt 146 lb 3.2 oz (66.3 kg)   SpO2 94%   BMI 25.10 kg/m  GENERAL: Well-developed patient elderly woman in no acute distress.  Presented in transport chair.  Able to ambulate, transfer chair use due to dyspnea. HEAD: Normocephalic, atraumatic.  EYES: Pupils equal, round, reactive to light.  No scleral icterus.  MOUTH: Nose/mouth/throat not examined due to masking requirements for COVID 19. NECK: Supple. No thyromegaly. Trachea midline. No JVD.  No adenopathy. PULMONARY: Distant breath sounds, coarse, few scattered rhonchi, no wheezes.   CARDIOVASCULAR: S1 and S2. Regular rate and rhythm.  No rubs, murmurs or gallops heard.  ABDOMEN: Benign. MUSCULOSKELETAL: No joint deformity, no clubbing, no edema.  NEUROLOGIC: No overt focal deficit.  Speech is fluent. SKIN: Intact,warm,dry.  Limited exam: No rashes. PSYCH:  Mood and behavior normal.       Assessment & Plan:     ICD-10-CM   1. Chronic obstructive pulmonary disease, unspecified COPD type ([place])  J44.9    She does not appear to be well compensated. Was not able to afford Trelegy Trial of Spiriva 2 puffs in the evening Will add Spiriva to the Breo  2. Chronic respiratory failure with hypoxia (HCC)  J96.11    Continue supplemental oxygen as previous QUIT SMOKING!  3. Obstructive sleep apnea syndrome  G47.33    CPAP machine on order per [facility] Continue supplemental O2 nocturnally   4. Tobacco dependence due to cigarettes  F17.210    Patient has been encouraged to quit smoking Counseled with regards to discontinuation of smoking Total counseling time 3 to 5 minutes   Meds ordered this encounter  Medications  .  Tiotropium Bromide Monohydrate (SPIRIVA RESPIMAT) 2.5 MCG/ACT AERS    Sig: Inhale 2 puffs into the lungs daily for 1 day.    Dispense:  4 g    Refill:  0    Order Specific Question:   Lot Number?    Answer:   [id number]    Order Specific Question:   Expiration Date?    Answer:   01/26/2021    Order Specific Question:   Manufacturer?    Answer:   [employer]SmithK[employer] [[id number]]    Order Specific Question:   Quantity    Answer:   2   Discussion:  Patient was given a trial of Trelegy.  Did not notice much help with this medication.  Her main issue is that she does not feel that once a day medications last her all day.  She notices deterioration of the effect towards evening.  She is currently on Breo Ellipta 100/25, 1 inhalation daily.  We will add Spiriva in the evenings to see if this helps with her symptoms.  Ideally she should be on a twice a day medications such as Breztri however, she just got a new shipment of Breo and would like to complete that before starting a new medication.  She is to let us know if Spiriva is helpful and we can send in a prescription for her.  We will see her in follow-up in 3 months time she is to contact us prior to  that time should any new difficulties arise.   Renold Don, MD [facility]   *This note was dictated using voice recognition software/Dragon.  Despite best efforts to proofread, errors can occur which can change the meaning.  Any change was purely unintentional.

## 2020-08-18 NOTE — Patient Instructions (Addendum)
For your travel in October make sure you call [employer] ahead of time so they can arrange for a portable oxygen concentrator for you  We are giving you a trial of Spiriva that will be 2 puffs once a day since you take Breo in the morning you should take Spiriva in the evening, let us know how you do with this medication  We will see you in follow-up in 3 months time call sooner should any new problems arise

## 2020-08-19 ENCOUNTER — Telehealth: Payer: Self-pay | Admitting: Pulmonary Disease

## 2020-08-19 DIAGNOSIS — J449 Chronic obstructive pulmonary disease, unspecified: Secondary | ICD-10-CM

## 2020-08-19 NOTE — Telephone Encounter (Signed)
Called and spoke to patient. Patient is planning to travel in October. She is requesting that an order be sent to [employer] to provide her with a portable oxygen concentrator for her trip. Order has been placed to [employer].  Nothing further is needed at this time.

## 2020-08-26 DIAGNOSIS — Z13820 Encounter for screening for osteoporosis: Secondary | ICD-10-CM | POA: Diagnosis not present

## 2020-08-26 LAB — HM DEXA SCAN

## 2020-09-09 DIAGNOSIS — H04123 Dry eye syndrome of bilateral lacrimal glands: Secondary | ICD-10-CM | POA: Diagnosis not present

## 2020-09-12 DIAGNOSIS — J449 Chronic obstructive pulmonary disease, unspecified: Secondary | ICD-10-CM | POA: Diagnosis not present

## 2020-09-24 ENCOUNTER — Other Ambulatory Visit: Payer: Self-pay | Admitting: Family Medicine

## 2020-10-07 DIAGNOSIS — G4733 Obstructive sleep apnea (adult) (pediatric): Secondary | ICD-10-CM | POA: Diagnosis not present

## 2020-10-13 DIAGNOSIS — J449 Chronic obstructive pulmonary disease, unspecified: Secondary | ICD-10-CM | POA: Diagnosis not present

## 2020-10-16 ENCOUNTER — Other Ambulatory Visit: Payer: Self-pay | Admitting: *Deleted

## 2020-10-16 DIAGNOSIS — Z87891 Personal history of nicotine dependence: Secondary | ICD-10-CM

## 2020-10-16 DIAGNOSIS — Z122 Encounter for screening for malignant neoplasm of respiratory organs: Secondary | ICD-10-CM

## 2020-10-16 NOTE — Progress Notes (Signed)
Contacted and scheduled for lung screening scan. Patient is a current smoker with a 57.25 pack year history.

## 2020-10-28 ENCOUNTER — Ambulatory Visit: Payer: Self-pay | Admitting: *Deleted

## 2020-10-28 NOTE — Telephone Encounter (Signed)
Pt called in c/o her BP being elevated yesterday at the dentist.   It was 190/102 so they did not clean her teeth. She has taken readings at home and consistently getting elevated BP: 190/90, 180/94.   At the time of this call (12:30 PM) it's 197/89. Only c/o mild headache is only symptom.  Denies all other symptoms when asked. I went over the s/s in the care advice to call 911 for.   She verbalized understanding and had her grandson who is with her listen also so he would know what to do.  I made her an appt for 10/29/2020 at 9:00 with Dennie Chrismon, PA via [employer].     I assisted her with her user name and creating a new password (forgotten her old password) to her MyChart acct.  I sent these notes to Vernie Murders, PA at [facility].   Dr. Rosanna Randy her primary was booked.    Reason for Disposition . Systolic BP  >= 992 OR Diastolic >= 426  Answer Assessment - Initial Assessment Questions 1. BLOOD PRESSURE: "What is the blood pressure?" "Did you take at least two measurements 5 minutes apart?"     I went to dentist yesterday and they would not clean my teeth because it was so high my BP. 2. ONSET: "When did you take your blood pressure?"     Yesterday at dentist it was190/102.  Today it's 197/89 now.   3. HOW: "How did you obtain the blood pressure?" (e.g., visiting nurse, automatic home BP monitor)     Home BP machine 4. HISTORY: "Do you have a history of high blood pressure?"     Yes 5. MEDICATIONS: "Are you taking any medications for blood pressure?" "Have you missed any doses recently?"     Yes Losartan and HCTZ 6. OTHER SYMPTOMS: "Do you have any symptoms?" (e.g., headache, chest pain, blurred vision, difficulty breathing, weakness)     Mild headaches, short of breath but it's my normal due to my COPD. 7. PREGNANCY: "Is there any chance you are pregnant?" "When was your last menstrual period?"     N/A due to age  Protocols used: BLOOD PRESSURE -  HIGH-A-AH

## 2020-10-29 ENCOUNTER — Other Ambulatory Visit: Payer: Self-pay | Admitting: Family Medicine

## 2020-10-29 ENCOUNTER — Other Ambulatory Visit: Payer: Self-pay

## 2020-10-29 ENCOUNTER — Telehealth (INDEPENDENT_AMBULATORY_CARE_PROVIDER_SITE_OTHER): Payer: Medicare Other | Admitting: Family Medicine

## 2020-10-29 ENCOUNTER — Encounter: Payer: Self-pay | Admitting: Family Medicine

## 2020-10-29 DIAGNOSIS — I10 Essential (primary) hypertension: Secondary | ICD-10-CM

## 2020-10-29 MED ORDER — LOSARTAN POTASSIUM 100 MG PO TABS: 100.0000 mg | ORAL_TABLET | Freq: Every day | ORAL | 1 refills | Status: DC

## 2020-10-29 NOTE — Progress Notes (Signed)
MyChart Video Visit    Virtual Visit via Video Note   This visit type was conducted due to national recommendations for restrictions regarding the COVID-19 Pandemic (e.g. social distancing) in an effort to limit this patient's exposure and mitigate transmission in our community. This patient is at least at moderate risk for complications without adequate follow up. This format is felt to be most appropriate for this patient at this time. Physical exam was limited by quality of the video and audio technology used for the visit.   Patient location: home Provider location: office  I discussed the limitations of evaluation and management by telemedicine and the availability of in person appointments. The patient expressed understanding and agreed to proceed.  Patient: Crystal White   DOB: [birth year]   79 y.o. Female  MRN: [id number] Visit Date: 10/29/2020  Today's healthcare provider: Vernie Murders, PA   No chief complaint on file.  Subjective    HPI  Patient is a 79 year old female who presents via video visit for evaluation of elevated blood pressure.     Medications: Outpatient Medications Prior to Visit  Medication Sig   acetaminophen (TYLENOL) 500 MG tablet Take 1,000 mg by mouth every 6 (six) hours as needed (pain/headaches.).    albuterol (PROVENTIL HFA) 108 (90 Base) MCG/ACT inhaler Inhale 2 puffs into the lungs every 6 (six) hours as needed for wheezing or shortness of breath.   BREO ELLIPTA 100-25 MCG/INH AEPB USE 1 INHALATION BY MOUTH  DAILY   citalopram (CELEXA) 20 MG tablet Take 1 tablet (20 mg total) by mouth daily. (Patient taking differently: Take 20 mg by mouth at bedtime. )   ezetimibe (ZETIA) 10 MG tablet TAKE 1 TABLET BY MOUTH  DAILY   gabapentin (NEURONTIN) 100 MG capsule TAKE 2 CAPSULES BY MOUTH 3  TIMES DAILY   hydrochlorothiazide (HYDRODIURIL) 25 MG tablet TAKE 1 TABLET BY MOUTH  DAILY   HYDROcodone-acetaminophen (NORCO) 5-325 MG tablet Take 1  tablet by mouth every 6 (six) hours as needed.   hyoscyamine (LEVBID) 0.375 MG 12 hr tablet Take 1 tablet (0.375 mg total) by mouth 2 (two) times daily as needed for cramping. Reported on 05/03/2016   loperamide (IMODIUM A-D) 2 MG tablet Take 2 mg by mouth at bedtime.   losartan (COZAAR) 100 MG tablet Take 1 tablet (100 mg total) by mouth daily.   meclizine (ANTIVERT) 25 MG tablet Take 1 tablet (25 mg total) by mouth 2 (two) times daily as needed for dizziness.   mirtazapine (REMERON) 30 MG tablet TAKE 1 TABLET BY MOUTH AT  BEDTIME   OXYGEN Inhale into the lungs daily. Uses at night with CPAP and PRN during daytime   pantoprazole (PROTONIX) 40 MG tablet TAKE 1 TABLET BY MOUTH  TWICE DAILY   Tiotropium Bromide Monohydrate (SPIRIVA RESPIMAT) 2.5 MCG/ACT AERS Inhale 2 puffs into the lungs daily for 1 day.   traZODone (DESYREL) 150 MG tablet TAKE 1 TABLET BY MOUTH AT  BEDTIME   No facility-administered medications prior to visit.    Review of Systems  Constitutional: Negative.   HENT: Negative.   Respiratory: Negative.   Cardiovascular: Negative.   Gastrointestinal: Negative.   Genitourinary: Negative.   :     Objective    BP (!) 175/85     Physical Exam: WDWN   in no apparent distress.  Head: Normocephalic, atraumatic. Neck: Supple, NROM Respiratory: No apparent distress Psych: Normal mood and affect      Assessment &  Plan     1. Essential (primary) hypertension Went to her dentist for a teeth cleaning and found BP was very high at 186/104 on 10-27-20. Checked medications and realized she had run out of her Losartan and only using HCTZ. [place] stated the prescription will come to her on 11-03-20. Will send a 30 day supply to her pharmacy and recheck prn. - losartan (COZAAR) 100 MG tablet; Take 1 tablet (100 mg total) by mouth daily.  Dispense: 30 tablet; Refill: 1   No follow-ups on file.     I discussed the assessment and treatment plan with the  patient. The patient was provided an opportunity to ask questions and all were answered. The patient agreed with the plan and demonstrated an understanding of the instructions.   The patient was advised to call back or seek an in-person evaluation if the symptoms worsen or if the condition fails to improve as anticipated.  I provided 10 minutes of non-face-to-face time during this encounter.  Andres Shad, PA, have reviewed all documentation for this visit. The documentation on 10/29/20 for the exam, diagnosis, procedures, and orders are all accurate and complete.   Vernie Murders, [place] [phone] (phone) [phone] (fax)  [place]

## 2020-11-04 ENCOUNTER — Other Ambulatory Visit: Payer: Self-pay | Admitting: Family Medicine

## 2020-11-05 ENCOUNTER — Ambulatory Visit
Admission: RE | Admit: 2020-11-05 | Discharge: 2020-11-05 | Disposition: A | Discharge status: Home / Self Care | Payer: Medicare Other | Source: Ambulatory Visit | Attending: Nurse Practitioner | Admitting: Nurse Practitioner

## 2020-11-05 ENCOUNTER — Other Ambulatory Visit: Payer: Self-pay

## 2020-11-05 DIAGNOSIS — D3501 Benign neoplasm of right adrenal gland: Secondary | ICD-10-CM | POA: Insufficient documentation

## 2020-11-05 DIAGNOSIS — Z87891 Personal history of nicotine dependence: Secondary | ICD-10-CM

## 2020-11-05 DIAGNOSIS — J432 Centrilobular emphysema: Secondary | ICD-10-CM | POA: Diagnosis not present

## 2020-11-05 DIAGNOSIS — I700 Atherosclerosis of aorta: Secondary | ICD-10-CM | POA: Insufficient documentation

## 2020-11-05 DIAGNOSIS — I2510 Atherosclerotic heart disease of native coronary artery without angina pectoris: Secondary | ICD-10-CM | POA: Diagnosis not present

## 2020-11-05 DIAGNOSIS — F17210 Nicotine dependence, cigarettes, uncomplicated: Secondary | ICD-10-CM | POA: Diagnosis not present

## 2020-11-05 DIAGNOSIS — Z122 Encounter for screening for malignant neoplasm of respiratory organs: Secondary | ICD-10-CM | POA: Diagnosis not present

## 2020-11-06 ENCOUNTER — Telehealth: Payer: Self-pay | Admitting: *Deleted

## 2020-11-06 DIAGNOSIS — G4733 Obstructive sleep apnea (adult) (pediatric): Secondary | ICD-10-CM | POA: Diagnosis not present

## 2020-11-06 NOTE — Telephone Encounter (Signed)
Attempted to contact patient to review lung screening results. However there is no answer or voicemail option available. Will try again later.

## 2020-11-09 ENCOUNTER — Telehealth: Payer: Self-pay | Admitting: *Deleted

## 2020-11-09 NOTE — Telephone Encounter (Signed)
Notified patient of LDCT lung cancer screening program results with recommendation for 12 month follow up imaging. Also notified of incidental findings noted below and is encouraged to discuss further with PCP who will receive a copy of this note and/or the CT report. Patient verbalizes understanding. Specifically, patient knows she needs further evaluation of gastric finding. She reports prior knowledge of a hernia in that area but will also make sure she talks to her GI provider.   IMPRESSION: 1. Lung-RADS 2S, benign appearance or behavior. Continue annual screening with low-dose chest CT without contrast in 12 months. 2. The "S" modifier above refers to potentially clinically significant non lung cancer related findings. Specifically, there is increasing the mass-like appearance of the proximal stomach adjacent to the area of prior fundoplication. Further evaluation with endoscopy should be considered to exclude neoplasm. 3. Aortic atherosclerosis, in addition to left main and 3 vessel coronary artery disease. Please note that although the presence of coronary artery calcium documents the presence of coronary artery disease, the severity of this disease and any potential stenosis cannot be assessed on this non-gated CT examination. Assessment for potential risk factor modification, dietary therapy or pharmacologic therapy may be warranted, if clinically indicated. 4. Mild diffuse bronchial wall thickening with mild centrilobular and paraseptal emphysema; imaging findings suggestive of underlying COPD. 5. Large right adrenal adenoma.  These results will be called to the ordering clinician or representative by the Radiologist Assistant, and communication documented in the PACS or [employer].  Aortic Atherosclerosis (ICD10-I70.0) and Emphysema (ICD10-J43.9).

## 2020-11-11 ENCOUNTER — Other Ambulatory Visit: Payer: Self-pay

## 2020-11-11 ENCOUNTER — Emergency Department
Admission: EM | Admit: 2020-11-11 | Discharge: 2020-11-11 | Disposition: A | Discharge status: Home / Self Care | Payer: Medicare Other | Attending: Emergency Medicine | Admitting: Emergency Medicine

## 2020-11-11 ENCOUNTER — Encounter: Payer: Self-pay | Admitting: Emergency Medicine

## 2020-11-11 ENCOUNTER — Emergency Department: Payer: Medicare Other

## 2020-11-11 DIAGNOSIS — Z7951 Long term (current) use of inhaled steroids: Secondary | ICD-10-CM | POA: Diagnosis not present

## 2020-11-11 DIAGNOSIS — Z86010 Personal history of colonic polyps: Secondary | ICD-10-CM | POA: Diagnosis not present

## 2020-11-11 DIAGNOSIS — Z79899 Other long term (current) drug therapy: Secondary | ICD-10-CM | POA: Diagnosis not present

## 2020-11-11 DIAGNOSIS — G319 Degenerative disease of nervous system, unspecified: Secondary | ICD-10-CM | POA: Diagnosis not present

## 2020-11-11 DIAGNOSIS — I6782 Cerebral ischemia: Secondary | ICD-10-CM | POA: Diagnosis not present

## 2020-11-11 DIAGNOSIS — J441 Chronic obstructive pulmonary disease with (acute) exacerbation: Secondary | ICD-10-CM | POA: Diagnosis not present

## 2020-11-11 DIAGNOSIS — R030 Elevated blood-pressure reading, without diagnosis of hypertension: Secondary | ICD-10-CM | POA: Diagnosis present

## 2020-11-11 DIAGNOSIS — R9431 Abnormal electrocardiogram [ECG] [EKG]: Secondary | ICD-10-CM | POA: Insufficient documentation

## 2020-11-11 DIAGNOSIS — I10 Essential (primary) hypertension: Secondary | ICD-10-CM | POA: Diagnosis not present

## 2020-11-11 DIAGNOSIS — I159 Secondary hypertension, unspecified: Secondary | ICD-10-CM

## 2020-11-11 DIAGNOSIS — H538 Other visual disturbances: Secondary | ICD-10-CM | POA: Diagnosis not present

## 2020-11-11 DIAGNOSIS — N189 Chronic kidney disease, unspecified: Secondary | ICD-10-CM | POA: Diagnosis not present

## 2020-11-11 DIAGNOSIS — F17210 Nicotine dependence, cigarettes, uncomplicated: Secondary | ICD-10-CM | POA: Diagnosis not present

## 2020-11-11 DIAGNOSIS — I129 Hypertensive chronic kidney disease with stage 1 through stage 4 chronic kidney disease, or unspecified chronic kidney disease: Secondary | ICD-10-CM | POA: Insufficient documentation

## 2020-11-11 DIAGNOSIS — D329 Benign neoplasm of meninges, unspecified: Secondary | ICD-10-CM | POA: Insufficient documentation

## 2020-11-11 DIAGNOSIS — R933 Abnormal findings on diagnostic imaging of other parts of digestive tract: Secondary | ICD-10-CM | POA: Diagnosis not present

## 2020-11-11 DIAGNOSIS — I4581 Long QT syndrome: Secondary | ICD-10-CM | POA: Diagnosis not present

## 2020-11-11 LAB — CBC
HCT: 47.4 % — ABNORMAL HIGH (ref 36.0–46.0)
Hemoglobin: 15.6 g/dL — ABNORMAL HIGH (ref 12.0–15.0)
MCH: 31.1 pg (ref 26.0–34.0)
MCHC: 32.9 g/dL (ref 30.0–36.0)
MCV: 94.4 fL (ref 80.0–100.0)
Platelets: 188 10*3/uL (ref 150–400)
RBC: 5.02 MIL/uL (ref 3.87–5.11)
RDW: 14 % (ref 11.5–15.5)
WBC: 7.6 10*3/uL (ref 4.0–10.5)
nRBC: 0 % (ref 0.0–0.2)

## 2020-11-11 LAB — BASIC METABOLIC PANEL
Anion gap: 11 (ref 5–15)
BUN: 40 mg/dL — ABNORMAL HIGH (ref 8–23)
CO2: 28 mmol/L (ref 22–32)
Calcium: 8.9 mg/dL (ref 8.9–10.3)
Chloride: 102 mmol/L (ref 98–111)
Creatinine, Ser: 1.83 mg/dL — ABNORMAL HIGH (ref 0.44–1.00)
GFR, Estimated: 28 mL/min — ABNORMAL LOW (ref >60)
Glucose, Bld: 98 mg/dL (ref 70–99)
Potassium: 4.1 mmol/L (ref 3.5–5.1)
Sodium: 141 mmol/L (ref 135–145)

## 2020-11-11 NOTE — ED Provider Notes (Signed)
[facility] Emergency Department Provider Note  ____________________________________________   Event Date/Time   First MD Initiated Contact with Patient 11/11/20 1559     (approximate)  I have reviewed the triage vital signs and the nursing notes.   HISTORY  Chief Complaint Hypertension   HPI Crystal White is a 79 y.o. female with a past medical history of HTN, COPD, HDL, OSA on CPAP at night, arthritis, and Mobitz type II AV block presents for assessment after family were noticed her blood pressure was elevated at 210 at a preop checkup.  Patient states she has had some intermittent blurred vision and headache so over the last couple of weeks but this is not daily and she does not currently have any of the symptoms.  She denies any vertigo, vision changes today, chest pain, cough, shortness of breath, nausea, vomiting, diarrhea, dysuria, imbalance, urinary symptoms, back pain, recent traumatic injuries or falls.  He states she currently feels completely fine and is not sure why she was told to come to emergency room.  She states she has been taking her medicine as directed.         Past Medical History:  Diagnosis Date  . Arthritis    hands, lower back  . Barrett's esophagus   . COPD (chronic obstructive pulmonary disease) ([place])   . Emphysema lung ([place])   . GERD (gastroesophageal reflux disease)   . Hernia of abdominal cavity   . Hyperlipidemia   . Hypertension   . Mobitz type II atrioventricular block   . OSA (obstructive sleep apnea)    on CPAP  . Peripheral neuropathy   . Presence of permanent cardiac pacemaker 09/22/11   Biotronik - EVIADR-T [facility])  . Shortness of breath dyspnea   . Stomach ulcer     Patient Active Problem List   Diagnosis Date Noted  . Incisional hernia, without obstruction or gangrene 06/03/2016  . Osteoporosis 02/22/2016  . Musculoskeletal chest pain 07/14/2015  . Chronic respiratory failure ([place]) 07/14/2015   . Acute bronchitis 07/14/2015  . Other emphysema ([place]) 07/14/2015  . Chronic renal insufficiency 07/14/2015  . Chest pain 07/13/2015  . Multiple pulmonary nodules 06/02/2015  . Colon polyp 05/29/2015  . Hemoptysis 04/29/2015  . Sprain of ankle 04/14/2015  . Anxiety 04/14/2015  . Barrett's esophagus 04/14/2015  . Acute exacerbation of chronic obstructive airways disease ([place]) 04/14/2015  . Bursitis 04/14/2015  . Cellulitis 04/14/2015  . Claudication ([place]) 04/14/2015  . Cough 04/14/2015  . Deficiency, 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  . Clinical depression 04/14/2015  . Dizziness 04/14/2015  . Dyslipidemia 04/14/2015  . Essential (primary) hypertension 04/14/2015  . Flank pain 04/14/2015  . H/O suicide attempt 04/14/2015  . Dysphonia 04/14/2015  . Hypercholesteremia 04/14/2015  . Cannot sleep 04/14/2015  . Malaise and fatigue 04/14/2015  . Mild cognitive impairment 04/14/2015  . Cramp in muscle 04/14/2015  . Muscle ache 04/14/2015  . Neuropathy 04/14/2015  . Adiposity 04/14/2015  . 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 Candida infection of mouth 04/14/2015  . Abnormal loss of weight 04/14/2015  . Decreased body weight 04/14/2015  . Artificial cardiac pacemaker 10/08/2014  . Apnea, sleep 10/08/2014  . COPD (chronic obstructive pulmonary disease) ([place]) 02/13/2012  . Tobacco abuse 02/13/2012  . Sinus congestion 02/13/2012  . Current tobacco use 02/13/2012  . Esophagitis, reflux 08/28/2006  . Abdominal pain, right lower quadrant 08/22/2005  . Acute appendicitis with peritoneal abscess 08/22/2005    Past Surgical History:  Procedure Laterality Date  .  APPENDECTOMY  2006   Dr. Pat Patrick  . BROW LIFT Bilateral 05/03/2016   Procedure: BLEPHAROPLASTY  BILATERAL UPPER EYELIDS WITH EXCESS SKIN REMOVAL BILATERAL BROW PTOSIS REPAIR BLEPHAROPTOSIS REPAIR BILATERAL EYES ;  Surgeon: Karle Starch, MD;  Location: [facility];  Service: Ophthalmology;  Laterality: Bilateral;  . CARDIAC  CATHETERIZATION  2007  . COLON SURGERY    . COLONOSCOPY WITH PROPOFOL N/A 04/14/2015   Procedure: COLONOSCOPY WITH PROPOFOL;  Surgeon: Lollie Sails, MD;  Location: [facility] ENDOSCOPY;  Service: Endoscopy;  Laterality: N/A;  . COLONOSCOPY WITH PROPOFOL N/A 01/24/2019   Procedure: COLONOSCOPY WITH PROPOFOL;  Surgeon: Lollie Sails, MD;  Location[facility] ENDOSCOPY;  Service: Endoscopy;  Laterality: N/A;  . COLONOSCOPY WITH PROPOFOL N/A 06/11/2019   Procedure: COLONOSCOPY WITH PROPOFOL;  Surgeon: Lollie Sails, MD;  Location[facility] END[facility]COPY;  Service: Endoscopy;  Laterality: N/A;  . ESOPHAGOGASTRODUODENOSCOPY N/A 04/14/2015   Procedure: ESOPHAGOGASTRODUODENOSCOPY (EGD);  Surgeon: Lollie Sails, MD;  Location: [facility] ENDOSCOPY;  Service: Endoscopy;  Laterality: N/A;  . ESOPHAGOGASTRODUODENOSCOPY (EGD) WITH PROPOFOL N/A 07/05/2017   Procedure: ESOPHAGOGASTRODUODENOSCOPY (EGD) WITH PROPOFOL;  Surgeon: Manya Silvas, MD;  Location: [facility] ENDOSCOPY;  Service: Endoscopy;  Laterality: N/A;  . ESOPHAGOGASTRODUODENOSCOPY (EGD) WITH PROPOFOL N/A 01/24/2019   Procedure: ESOPHAGOGASTRODUODENOSCOPY (EGD) WITH PROPOFOL;  Surgeon: Lollie Sails, MD;  Location: [facility] ENDOSCOPY;  Service: Endoscopy;  Laterality: N/A;  . HERNIA REPAIR    . HIATAL HERNIA REPAIR  2007  . INSERT / REPLACE / REMOVE PACEMAKER    . LAPAROSCOPIC RIGHT HEMI COLECTOMY Right 06/22/2015   Procedure: LAPAROSCOPIC RIGHT HEMI COLECTOMY;  Surgeon: Marlyce Huge, MD;  Location: [facility] ORS;  Service: General;  Laterality: Right;  . NISSEN FUNDOPLICATION  6720  . OPEN REDUCTION INTERNAL FIXATION (ORIF) DISTAL RADIAL FRACTURE Left 08/22/2019   Procedure: OPEN REDUCTION INTERNAL FIXATION (ORIF) DISTAL RADIAL FRACTURE;  Surgeon: Hessie Knows, MD;  Location: [facility] ORS;  Service: Orthopedics;  Laterality: Left;  . PACEMAKER INSERTION  09/22/11   [facility]  - Biotronik EVIADR-T  . TOTAL ABDOMINAL HYSTERECTOMY  1980    Prior to Admission  medications   Medication Sig Start Date End Date Taking? Authorizing Provider  acetaminophen (TYLENOL) 500 MG tablet Take 1,000 mg by mouth every 6 (six) hours as needed (pain/headaches.).     [provider]  albuterol (PROVENTIL HFA) 108 (90 Base) MCG/ACT inhaler Inhale 2 puffs into the lungs every 6 (six) hours as needed for wheezing or shortness of breath. 01/13/18   Birdie Sons, MD  BREO ELLIPTA 100-25 MCG/INH AEPB USE 1 INHALATION BY MOUTH  DAILY 01/22/20   Jerrol Banana., MD  citalopram (CELEXA) 20 MG tablet Take 1 tablet (20 mg total) by mouth daily. Patient taking differently: Take 20 mg by mouth at bedtime.  03/26/19   Jerrol Banana., MD  ezetimibe (ZETIA) 10 MG tablet TAKE 1 TABLET BY MOUTH  DAILY 05/25/20   Jerrol Banana., MD  gabapentin (NEURONTIN) 100 MG capsule TAKE 2 CAPSULES BY MOUTH 3  TIMES DAILY 09/24/20   Jerrol Banana., MD  hydrochlorothiazide (HYDRODIURIL) 25 MG tablet TAKE 1 TABLET BY MOUTH  DAILY 11/04/20   Jerrol Banana., MD  HYDROcodone-acetaminophen [facility]) 5-325 MG tablet Take 1 tablet by mouth every 6 (six) hours as needed. 08/22/19   Hessie Knows, MD  hyoscyamine (LEVBID) 0.375 MG 12 hr tablet Take 1 tablet (0.375 mg total) by mouth 2 (two) times daily as needed for  cramping. Reported on 05/03/2016 Patient not taking: Reported on 10/29/2020 09/14/16   Jerrol Banana., MD  loperamide (IMODIUM A-D) 2 MG tablet Take 2 mg by mouth at bedtime. Patient not taking: Reported on 10/29/2020    [provider]  losartan (COZAAR) 100 MG tablet TAKE 1 TABLET(100 MG) BY MOUTH DAILY 10/29/20   Chrismon, Vickki Muff, PA-C  meclizine (ANTIVERT) 25 MG tablet Take 1 tablet (25 mg total) by mouth 2 (two) times daily as needed for dizziness. 09/14/16   Jerrol Banana., MD  mirtazapine (REMERON) 30 MG tablet TAKE 1 TABLET BY MOUTH AT  BEDTIME 07/06/20   Jerrol Banana., MD  OXYGEN Inhale into the lungs daily. Uses at  night with CPAP and PRN during daytime    [provider]  pantoprazole (PROTONIX) 40 MG tablet TAKE 1 TABLET BY MOUTH  TWICE DAILY 05/22/20   Jerrol Banana., MD  Tiotropium Bromide Monohydrate (SPIRIVA RESPIMAT) 2.5 MCG/ACT AERS Inhale 2 puffs into the lungs daily for 1 day. 08/18/20 08/19/20  Tyler Pita, MD  traZODone (DESYREL) 150 MG tablet TAKE 1 TABLET BY MOUTH AT  BEDTIME 11/04/20   Jerrol Banana., MD    Allergies Iodinated diagnostic agents, Bacitracin, and Statins  Family History  Adopted: Yes  Problem Relation Age of Onset  . Diabetes Son     Social History Social History   Tobacco Use  . Smoking status: Current Every Day Smoker    Packs/day: 1.00    Years: 55.00    Pack years: 55.00    Types: Cigarettes  . Smokeless tobacco: Never Used  Vaping Use  . Vaping Use: Never used  Substance Use Topics  . Alcohol use: Yes    Alcohol/week: 0.0 standard drinks    Comment: 1/2 glass wine -rare  . Drug use: No    Review of Systems  Review of Systems  Constitutional: Negative for chills and fever.  HENT: Negative for sore throat.   Eyes: Positive for blurred vision ( intermittently over last several months, no today). Negative for pain.  Respiratory: Negative for cough and stridor.   Cardiovascular: Negative for chest pain.  Gastrointestinal: Negative for vomiting.  Skin: Negative for rash.  Neurological: Negative for seizures, loss of consciousness and headaches.  Psychiatric/Behavioral: Negative for suicidal ideas.  All other systems reviewed and are negative.     ____________________________________________   PHYSICAL EXAM:  VITAL SIGNS: ED Triage Vitals [11/11/20 1316]  Enc Vitals Group     BP (!) 192/92     Pulse Rate 94     Resp 20     Temp 98.7 F (37.1 C)     Temp Source Oral     SpO2 94 %     Weight 139 lb 0.1 oz (63.1 kg)     Height 5\' 4"  (1.626 m)     Head Circumference      Peak Flow      Pain Score 0     Pain  Loc      Pain Edu?      Excl. in [place]?    Vitals:   11/11/20 1316 11/11/20 1620  BP: (!) 192/92 (!) 210/89  Pulse: 94 81  Resp: 20 18  Temp: 98.7 F (37.1 C)   SpO2: 94% 92%   Physical Exam Vitals and nursing note reviewed.  Constitutional:      General: She is not in acute distress.    Appearance: She is  well-developed and well-nourished.  HENT:     Head: Normocephalic and atraumatic.     Right Ear: External ear normal.     Left Ear: External ear normal.     Nose: Nose normal.  Eyes:     Conjunctiva/sclera: Conjunctivae normal.  Cardiovascular:     Rate and Rhythm: Regular rhythm.     Heart sounds: No murmur heard.   Pulmonary:     Effort: Pulmonary effort is normal. No respiratory distress.     Breath sounds: Normal breath sounds.  Abdominal:     Palpations: Abdomen is soft.     Tenderness: There is no abdominal tenderness.  Musculoskeletal:        General: No edema.     Cervical back: Neck supple.  Skin:    General: Skin is warm and dry.  Neurological:     Mental Status: She is alert and oriented to person, place, and time.  Psychiatric:        Mood and Affect: Mood and affect and mood normal.     Cranial nerves II through XII grossly intact.  No pronator drift.  No finger dysmetria.  Symmetric 5/5 strength of all extremities.  Sensation intact to light touch in all extremities.  Unremarkable unassisted gait.  ____________________________________________   LABS (all labs ordered are listed, but only abnormal results are displayed)  Labs Reviewed  CBC - Abnormal; Notable for the following components:      Result Value   Hemoglobin 15.6 (*)    HCT 47.4 (*)    All other components within normal limits  BASIC METABOLIC PANEL - Abnormal; Notable for the following components:   BUN 40 (*)    Creatinine, Ser 1.83 (*)    GFR, Estimated 28 (*)    All other components within normal limits   ____________________________________________  EKG  Atrially sensed  ventricular paced rhythm with a rate of 76, prolonged QTc interval, T wave inversions in lead I and aVL as well as some nonspecific changes throughout including in inferior and anterior leads..  This is compared to ECG obtained in 2016 that also associates changes as well as prolonged QTC.  No clear acute changes today. ____________________________________________  RADIOLOGY  ED MD interpretation: No evidence of acute CVA or intracranial hemorrhage.  Evidence of likely meningioma and small vessel chronic vascular disease.  Official radiology report(s): CT Head Wo Contrast  Result Date: 11/11/2020 CLINICAL DATA:  Blurred vision. Additional history provided: Hypertension EXAM: CT HEAD WITHOUT CONTRAST TECHNIQUE: Contiguous axial images were obtained from the base of the skull through the vertex without intravenous contrast. COMPARISON:  Prior head CT examinations 08/19/2019 and earlier. FINDINGS: Brain: Mild cerebral atrophy. Find hypoattenuation within the cerebral white matter is nonspecific, but compatible with chronic small vessel ischemic disease. Unchanged 11 mm rounded dural-based calcification along the right aspect of the posterior falx likely reflecting a meningioma. Also again demonstrated is a partially calcified mass within the right posterior fossa along the distal transverse and sigmoid dural venous sinuses measuring at least 18 mm. This also likely reflects a meningioma. No significant mass effect upon the underlying right parietal lobe or right cerebellum. There is no acute intracranial hemorrhage. No demarcated cortical infarct. No extra-axial fluid collection. No midline shift. Vascular: No hyperdense vessel.  Atherosclerotic calcifications. Skull: Normal. Negative for fracture or focal lesion. Sinuses/Orbits: Visualized orbits show no acute finding. Trace ethmoid sinus mucosal thickening. IMPRESSION: No evidence of acute intracranial abnormality. Redemonstrated probable meningiomas along  the right aspect  of the posterior falx (11 mm) and within the right posterior fossa (at least 18 mm). The mass within the right posterior fossa is in close proximity to the distal right transverse and sigmoid dural venous sinuses. Nonemergent brain MRI with contrast is recommended for further evaluation and to assess for patency of the dural venous sinuses. Mild cerebral atrophy and chronic small vessel ischemic disease. Electronically Signed   By: Kellie Simmering DO   On: 11/11/2020 14:46    ____________________________________________   PROCEDURES  Procedure(s) performed (including Critical Care):  Procedures   ____________________________________________   INITIAL IMPRESSION / ASSESSMENT AND PLAN / ED COURSE      Patient presents with elevated blood pressures seen at a preop check that are asymptomatic over the last couple days but associate with some possible blurry vision over the last couple weeks.  On arrival patient is hypertensive with BP of 192/92 with otherwise stable vital signs.  She has no evidence of CVA on exam with no focal neurological deficits.  She denies any chest pain I have low suspicion for ACS given this and otherwise relatively unchanged EKG when compared to prior.  Presentation is not consistent with dissection and there is no evidence of intracranial hemorrhage on CT head.  CBC is unremarkable.  BMP shows a creatinine of 1.83 compared to 1.783 months ago and no other significant derangements.  We will blood pressure is quite elevated today patient does not appear to be symptomatic at all there is no evidence on work-up for acute endorgan damage.  Advised patient to continue taking her blood pressure medications as advised and follow-up with her PCP in the next 3 to 5 days.  Discharge stable condition.  Strict return precautions advised and discussed.         ____________________________________________   FINAL CLINICAL IMPRESSION(S) / ED DIAGNOSES  Final  diagnoses:  Secondary hypertension  Chronic kidney disease, unspecified CKD stage  QT prolongation  Meningioma (HCC)    Medications - No data to display   ED Discharge Orders    None       Note:  This document was prepared using Dragon voice recognition software and may include unintentional dictation errors.   Lucrezia Starch, MD 11/11/20 [phone]

## 2020-11-11 NOTE — ED Notes (Signed)
MD Tamala Julian aware of BP. PT denies any sx. Encouraged to return if sx arise.

## 2020-11-11 NOTE — Discharge Instructions (Addendum)
Your Head CT today showed No evidence of acute intracranial abnormality.   Redemonstrated probable meningiomas along the right aspect of the posterior falx (11 mm) and within the right posterior fossa (at least 18 mm). The mass within the right posterior fossa is in close proximity to the distal right transverse and sigmoid dural venous sinuses. Nonemergent brain MRI with contrast is recommended for further evaluation and to assess for patency of the dural venous sinuses.   Mild cerebral atrophy and chronic small vessel ischemic disease.

## 2020-11-11 NOTE — ED Triage Notes (Signed)
Pt to ED via POV with family member, pt reports BP 210. Pt states had some blurred vision, denies any at this time. Pt states symptoms x several weeks. Pt states was here for pre-op check up.

## 2020-11-12 ENCOUNTER — Ambulatory Visit (INDEPENDENT_AMBULATORY_CARE_PROVIDER_SITE_OTHER): Payer: Medicare Other | Admitting: Physician Assistant

## 2020-11-12 ENCOUNTER — Ambulatory Visit: Payer: Self-pay | Admitting: *Deleted

## 2020-11-12 ENCOUNTER — Encounter: Payer: Self-pay | Admitting: Physician Assistant

## 2020-11-12 VITALS — BP 191/72 | HR 86 | Temp 98.3°F | Wt 146.4 lb

## 2020-11-12 DIAGNOSIS — I10 Essential (primary) hypertension: Secondary | ICD-10-CM

## 2020-11-12 DIAGNOSIS — N1832 Chronic kidney disease, stage 3b: Secondary | ICD-10-CM | POA: Diagnosis not present

## 2020-11-12 DIAGNOSIS — N2889 Other specified disorders of kidney and ureter: Secondary | ICD-10-CM

## 2020-11-12 DIAGNOSIS — J449 Chronic obstructive pulmonary disease, unspecified: Secondary | ICD-10-CM | POA: Diagnosis not present

## 2020-11-12 MED ORDER — AMLODIPINE BESYLATE 10 MG PO TABS: ORAL_TABLET | ORAL | 0 refills | Status: DC

## 2020-11-12 NOTE — Telephone Encounter (Signed)
Just FYI. Thanks!  

## 2020-11-12 NOTE — Telephone Encounter (Signed)
Patient is calling to report she has elevated BP- she was sent to ED yesterday by another provider due to he BP reading in office- she states nothing was done for her BP at ED. Readings are elevated today- appointment scheduled at office  Reason for Disposition . [0] Systolic BP  >= 940 OR Diastolic >= 768  AND [0] having NO cardiac or neurologic symptoms  Answer Assessment - Initial Assessment Questions 1. BLOOD PRESSURE: "What is the blood pressure?" "Did you take at least two measurements 5 minutes apart?"     185/87, 223/93 P 90, 204/82 2. ONSET: "When did you take your blood pressure?"     9:45, 10:00 3. HOW: "How did you obtain the blood pressure?" (e.g., visiting nurse, automatic home BP monitor)    Automatic cuff- arm 4. HISTORY: "Do you have a history of high blood pressure?"     yes 5. MEDICATIONS: "Are you taking any medications for blood pressure?" "Have you missed any doses recently?"     Yes- no missed doses (losartin had been missing- she has restarted), increased stress- health changes 6. OTHER SYMPTOMS: "Do you have any symptoms?" (e.g., headache, chest pain, blurred vision, difficulty breathing, weakness)     Off/on headaches 7. PREGNANCY: "Is there any chance you are pregnant?" "When was your last menstrual period?"     n/a  Protocols used: BLOOD PRESSURE - HIGH-A-AH

## 2020-11-12 NOTE — Progress Notes (Signed)
Established patient visit   Patient: Crystal White   DOB: 1941/11/14   79 y.o. Female  MRN: [id number] Visit Date: 11/12/2020  Today's healthcare provider: Trinna Post, PA-C   Chief Complaint  Patient presents with  . Hypertension  I,Rondrick Barreira M Saket Hellstrom,acting as a scribe for [employer], PA-C.,have documented all relevant documentation on the behalf of Trinna Post, PA-C,as directed by  Trinna Post, PA-C while in the presence of Trinna Post, PA-C.  Subjective    HPI  Hypertension, follow-up  BP Readings from Last 3 Encounters:  11/12/20 (!) 191/72  11/11/20 (!) 210/89  10/29/20 (!) 175/85   Wt Readings from Last 3 Encounters:  11/12/20 146 lb 6.4 oz (66.4 kg)  11/11/20 139 lb 0.1 oz (63.1 kg)  11/05/20 140 lb (63.5 kg)     She was last seen for hypertension 2 weeks ago. Patient was seen in [facility] ER on yesterday,11/11/2020 as well.  BP at that visit was 175/85 & 210/89. Management since that visit includes no changes to medications. She is currently taking losartan 100 mg daily. More recently her Bps have been elevated to the point where [facility] delayed her cleaning. She has an upcoming colonoscopy and endoscopy on 11/23/2020 to evaluate a potential mass. She is concerned her BP might be too high to have this procedure done.   She reports good compliance with treatment. She is not having side effects.  She is following a Regular, Low Sodium diet. She is not exercising. She does smoke.  Use of agents associated with hypertension: none.   Outside blood pressures are arranging around 200's-140's/50's-100's. Symptoms: No chest pain No chest pressure  No palpitations No syncope  No dyspnea No orthopnea  No paroxysmal nocturnal dyspnea No lower extremity edema   Pertinent labs: Lab Results  Component Value Date   CHOL 232 (H) 08/11/2020   HDL 52 08/11/2020   LDLCALC 148 (H) 08/11/2020   TRIG 180 (H) 08/11/2020   CHOLHDL 4.5 (H)  08/11/2020   Lab Results  Component Value Date   NA 141 11/11/2020   K 4.1 11/11/2020   CREATININE 1.83 (H) 11/11/2020   GFRNONAA 28 (L) 11/11/2020   GFRAA 31 (L) 08/11/2020   GLUCOSE 98 11/11/2020     The 10-year ASCVD risk score Mikey Bussing DC Jr., et al., 2013) is: 63.8%   ---------------------------------------------------------------------------------------------------      Medications: Outpatient Medications Prior to Visit  Medication Sig  . acetaminophen (TYLENOL) 500 MG tablet Take 1,000 mg by mouth every 6 (six) hours as needed (pain/headaches.).   Marland Kitchen albuterol (PROVENTIL HFA) 108 (90 Base) MCG/ACT inhaler Inhale 2 puffs into the lungs every 6 (six) hours as needed for wheezing or shortness of breath.  Marland Kitchen BREO ELLIPTA 100-25 MCG/INH AEPB USE 1 INHALATION BY MOUTH  DAILY  . citalopram (CELEXA) 20 MG tablet Take 1 tablet (20 mg total) by mouth daily. (Patient taking differently: Take 20 mg by mouth at bedtime.)  . ezetimibe (ZETIA) 10 MG tablet TAKE 1 TABLET BY MOUTH  DAILY  . gabapentin (NEURONTIN) 100 MG capsule TAKE 2 CAPSULES BY MOUTH 3  TIMES DAILY  . hydrochlorothiazide (HYDRODIURIL) 25 MG tablet TAKE 1 TABLET BY MOUTH  DAILY  . HYDROcodone-acetaminophen (NORCO) 5-325 MG tablet Take 1 tablet by mouth every 6 (six) hours as needed.  Marland Kitchen losartan (COZAAR) 100 MG tablet TAKE 1 TABLET(100 MG) BY MOUTH DAILY  . meclizine (ANTIVERT) 25 MG tablet Take 1 tablet (  25 mg total) by mouth 2 (two) times daily as needed for dizziness.  . mirtazapine (REMERON) 30 MG tablet TAKE 1 TABLET BY MOUTH AT  BEDTIME  . OXYGEN Inhale into the lungs daily. Uses at night with CPAP and PRN during daytime  . pantoprazole (PROTONIX) 40 MG tablet TAKE 1 TABLET BY MOUTH  TWICE DAILY  . traZODone (DESYREL) 150 MG tablet TAKE 1 TABLET BY MOUTH AT  BEDTIME  . hyoscyamine (LEVBID) 0.375 MG 12 hr tablet Take 1 tablet (0.375 mg total) by mouth 2 (two) times daily as needed for cramping. Reported on 05/03/2016 (Patient  not taking: No sig reported)  . loperamide (IMODIUM A-D) 2 MG tablet Take 2 mg by mouth at bedtime. (Patient not taking: No sig reported)  . Tiotropium Bromide Monohydrate (SPIRIVA RESPIMAT) 2.5 MCG/ACT AERS Inhale 2 puffs into the lungs daily for 1 day.   No facility-administered medications prior to visit.    Review of Systems  Constitutional: Negative.   Respiratory: Negative.   Cardiovascular: Negative.   Hematological: Negative.       Objective    BP (!) 191/72 (BP Location: Right Arm, Patient Position: Sitting, Cuff Size: Large)   Pulse 86   Temp 98.3 F (36.8 C) (Oral)   Wt 146 lb 6.4 oz (66.4 kg)   SpO2 97%   BMI 25.13 kg/m    Physical Exam Constitutional:      Appearance: Normal appearance.  Cardiovascular:     Rate and Rhythm: Normal rate and regular rhythm.     Heart sounds: Normal heart sounds.  Pulmonary:     Effort: Pulmonary effort is normal.     Breath sounds: Normal breath sounds.  Musculoskeletal:     Right lower leg: No edema.     Left lower leg: No edema.  Skin:    General: Skin is warm and dry.  Neurological:     General: No focal deficit present.     Mental Status: She is alert and oriented to person, place, and time. Mental status is at baseline.  Psychiatric:        Mood and Affect: Mood normal.        Behavior: Behavior normal.       No results found for any visits on 11/12/20.  Assessment & Plan    1. Essential (primary) hypertension  Start as below. Recommend follow up next week if BP is still not improved. Follow up with PCP in 1 month.   - amLODipine (NORVASC) 10 MG tablet; Take 5 mg daily for one week. Then take 10 mg daily onward.  Dispense: 90 tablet; Refill: 0  2. Chronic renal impairment, stage 3b (HCC)    No follow-ups on file.      ITrinna Post, PA-C, have reviewed all documentation for this visit. The documentation on 11/12/20 for the exam, diagnosis, procedures, and orders are all accurate and  complete.  The entirety of the information documented in the History of Present Illness, Review of Systems and Physical Exam were personally obtained by me. Portions of this information were initially documented by [facility] and reviewed by me for thoroughness and accuracy.     Paulene Floor  [facility] [phone] (phone) [phone] (fax)  [place]

## 2020-11-12 NOTE — Patient Instructions (Signed)

## 2020-11-19 ENCOUNTER — Other Ambulatory Visit
Admission: RE | Admit: 2020-11-19 | Discharge: 2020-11-19 | Disposition: A | Discharge status: Home / Self Care | Payer: Medicare Other | Source: Ambulatory Visit | Attending: Gastroenterology | Admitting: Gastroenterology

## 2020-11-19 ENCOUNTER — Encounter: Payer: Self-pay | Admitting: *Deleted

## 2020-11-19 ENCOUNTER — Other Ambulatory Visit: Payer: Self-pay

## 2020-11-19 DIAGNOSIS — Z20822 Contact with and (suspected) exposure to covid-19: Secondary | ICD-10-CM | POA: Diagnosis not present

## 2020-11-19 DIAGNOSIS — Z01812 Encounter for preprocedural laboratory examination: Secondary | ICD-10-CM | POA: Insufficient documentation

## 2020-11-19 LAB — SARS CORONAVIRUS 2 (TAT 6-24 HRS): SARS Coronavirus 2: NEGATIVE

## 2020-11-23 ENCOUNTER — Encounter: Payer: Self-pay | Admitting: *Deleted

## 2020-11-23 ENCOUNTER — Encounter
Admission: RE | Disposition: A | Discharge status: Home / Self Care | Payer: Self-pay | Source: Home / Self Care | Attending: Gastroenterology

## 2020-11-23 ENCOUNTER — Ambulatory Visit: Payer: Medicare Other | Admitting: Anesthesiology

## 2020-11-23 ENCOUNTER — Other Ambulatory Visit: Payer: Self-pay

## 2020-11-23 ENCOUNTER — Ambulatory Visit
Admission: RE | Admit: 2020-11-23 | Discharge: 2020-11-23 | Disposition: A | Discharge status: Home / Self Care | Payer: Medicare Other | Attending: Gastroenterology | Admitting: Gastroenterology

## 2020-11-23 DIAGNOSIS — Z79899 Other long term (current) drug therapy: Secondary | ICD-10-CM | POA: Diagnosis not present

## 2020-11-23 DIAGNOSIS — K2960 Other gastritis without bleeding: Secondary | ICD-10-CM | POA: Diagnosis not present

## 2020-11-23 DIAGNOSIS — R933 Abnormal findings on diagnostic imaging of other parts of digestive tract: Secondary | ICD-10-CM | POA: Insufficient documentation

## 2020-11-23 DIAGNOSIS — K219 Gastro-esophageal reflux disease without esophagitis: Secondary | ICD-10-CM | POA: Diagnosis not present

## 2020-11-23 DIAGNOSIS — Z1211 Encounter for screening for malignant neoplasm of colon: Secondary | ICD-10-CM | POA: Diagnosis not present

## 2020-11-23 DIAGNOSIS — J449 Chronic obstructive pulmonary disease, unspecified: Secondary | ICD-10-CM | POA: Diagnosis not present

## 2020-11-23 DIAGNOSIS — Z86010 Personal history of colonic polyps: Secondary | ICD-10-CM | POA: Diagnosis not present

## 2020-11-23 DIAGNOSIS — Z888 Allergy status to other drugs, medicaments and biological substances status: Secondary | ICD-10-CM | POA: Diagnosis not present

## 2020-11-23 DIAGNOSIS — K5730 Diverticulosis of large intestine without perforation or abscess without bleeding: Secondary | ICD-10-CM | POA: Insufficient documentation

## 2020-11-23 DIAGNOSIS — Z9981 Dependence on supplemental oxygen: Secondary | ICD-10-CM | POA: Diagnosis not present

## 2020-11-23 DIAGNOSIS — Z7951 Long term (current) use of inhaled steroids: Secondary | ICD-10-CM | POA: Insufficient documentation

## 2020-11-23 DIAGNOSIS — Z980 Intestinal bypass and anastomosis status: Secondary | ICD-10-CM | POA: Insufficient documentation

## 2020-11-23 DIAGNOSIS — Z881 Allergy status to other antibiotic agents status: Secondary | ICD-10-CM | POA: Diagnosis not present

## 2020-11-23 DIAGNOSIS — K5790 Diverticulosis of intestine, part unspecified, without perforation or abscess without bleeding: Secondary | ICD-10-CM | POA: Diagnosis not present

## 2020-11-23 DIAGNOSIS — K3189 Other diseases of stomach and duodenum: Secondary | ICD-10-CM | POA: Diagnosis not present

## 2020-11-23 SURGERY — COLONOSCOPY WITH PROPOFOL
Anesthesia: General

## 2020-11-23 MED ORDER — LIDOCAINE HCL (CARDIAC) PF 100 MG/5ML IV SOSY
PREFILLED_SYRINGE | INTRAVENOUS | Status: DC | PRN
  Administered 2020-11-23: 50 mg via INTRAVENOUS

## 2020-11-23 MED ORDER — PROPOFOL 10 MG/ML IV BOLUS
INTRAVENOUS | Status: DC | PRN
  Administered 2020-11-23: 30 mg via INTRAVENOUS
  Administered 2020-11-23: 50 mg via INTRAVENOUS

## 2020-11-23 MED ORDER — SODIUM CHLORIDE 0.9 % IV SOLN: INTRAVENOUS | Status: DC

## 2020-11-23 MED ORDER — LIDOCAINE HCL (PF) 2 % IJ SOLN
INTRAMUSCULAR | Status: AC
  Filled 2020-11-23: qty 5

## 2020-11-23 MED ORDER — SODIUM CHLORIDE 0.9 % IV SOLN: INTRAVENOUS | Status: DC | PRN

## 2020-11-23 MED ORDER — PROPOFOL 500 MG/50ML IV EMUL
INTRAVENOUS | Status: DC | PRN
  Administered 2020-11-23: 150 ug/kg/min via INTRAVENOUS

## 2020-11-23 NOTE — Transfer of Care (Signed)
Immediate Anesthesia Transfer of Care Note  Patient: Crystal White  Procedure(s) Performed: COLONOSCOPY WITH PROPOFOL (N/A ) ESOPHAGOGASTRODUODENOSCOPY (EGD) WITH PROPOFOL (N/A )  Patient Location: PACU and Endoscopy Unit  Anesthesia Type:General  Level of Consciousness: drowsy  Airway & Oxygen Therapy: Patient Spontanous Breathing and Patient connected to nasal cannula oxygen  Post-op Assessment: Report given to RN  Post vital signs: Reviewed and stable  Last Vitals:  Vitals Value Taken Time  BP 104/46 11/23/20 1258  Temp 35.7 C 11/23/20 1258  Pulse 94 11/23/20 1258  Resp 25 11/23/20 1258  SpO2 95 % 11/23/20 1258    Last Pain:  Vitals:   11/23/20 1258  TempSrc:   PainSc: Asleep         Complications: No complications documented.

## 2020-11-23 NOTE — Op Note (Signed)
[facility]astroenter[facility] Patient Name: Crystal White Procedure Date: 11/23/2020 12:25 PM MRN: [id number] Account #: [id number] Date of Birth: [birth year] Admit Type: Outpatient Age: 79 Room: [facility] ENDO ROOM 3 Gender: Female Note Status: Finalized Procedure:             Upper GI endoscopy Indications:           Abnormal CT of the GI tract Providers:             Andrey Farmer MD, MD Referring MD:          Janine Ores. Rosanna Randy, MD (Referring MD) Medicines:             Monitored Anesthesia Care Complications:         No immediate complications. Estimated blood loss:                         Minimal. Procedure:             Pre-Anesthesia Assessment:                        - Prior to the procedure, a History and Physical was                         performed, and patient medications and allergies were                         reviewed. The patient is competent. The risks and                         benefits of the procedure and the sedation options and                         risks were discussed with the patient. All questions                         were answered and informed consent was obtained.                         Patient identification and proposed procedure were                         verified by the physician, the nurse, the anesthetist                         and the technician in the endoscopy suite. Mental                         Status Examination: alert and oriented. Airway                         Examination: normal oropharyngeal airway and neck                         mobility. Respiratory Examination: clear to                         auscultation. CV Examination: normal. Prophylactic  Antibiotics: The patient does not require prophylactic                         antibiotics. Prior Anticoagulants: The patient has                         taken no previous anticoagulant or antiplatelet                         agents. ASA Grade  Assessment: III - A patient with                         severe systemic disease. After reviewing the risks and                         benefits, the patient was deemed in satisfactory                         condition to undergo the procedure. The anesthesia                         plan was to use monitored anesthesia care (MAC).                         Immediately prior to administration of medications,                         the patient was re-assessed for adequacy to receive                         sedatives. The heart rate, respiratory rate, oxygen                         saturations, blood pressure, adequacy of pulmonary                         ventilation, and response to care were monitored                         throughout the procedure. The physical status of the                         patient was re-assessed after the procedure.                        After obtaining informed consent, the endoscope was                         passed under direct vision. Throughout the procedure,                         the patient's blood pressure, pulse, and oxygen                         saturations were monitored continuously. The Endoscope                         was introduced through the mouth, and advanced to the  second part of duodenum. The upper GI endoscopy was                         accomplished without difficulty. The patient tolerated                         the procedure well. Findings:      The examined esophagus was normal.      Localized prominent gastric folds were found in the gastric fundus.       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 Biopsies were taken with a cold       forceps for histology. Estimated blood loss was minimal.      The exam of the stomach was otherwise normal.      The examined duodenum was normal. Impression:            - Normal esophagus.                        - Enlarged gastric folds. Biopsied.                        -  Normal examined duodenum. Recommendation:        - Perform a colonoscopy today. Procedure Code(s):     --- Professional ---                        [phone], Esophagogastroduodenoscopy, flexible,                         transoral; with biopsy, single or multiple Diagnosis Code(s):     --- Professional ---                        K29.60, Other gastritis without bleeding                        R93.3, Abnormal findings on diagnostic imaging of                         other parts of digestive tract CPT copyright 2019 American Medical Association. All rights reserved. The codes documented in this report are preliminary and upon coder review may  be revised to meet current compliance requirements. Andrey Farmer, MD Andrey Farmer MD, MD 11/23/2020 1:01:25 PM Number of Addenda: 0 Note Initiated On: 11/23/2020 12:25 PM Estimated Blood Loss:  Estimated blood loss: none. Estimated blood loss was                         minimal.      [facility]

## 2020-11-23 NOTE — Op Note (Signed)
[facility] Gastroenterology Patient Name: Crystal White Procedure Date: 11/23/2020 12:24 PM MRN: [id number] Account #: [id number] Date of Birth: 1941/04/28 Admit Type: Outpatient Age: 79 Room: [facility] ENDO ROOM 3 Gender: Female Note Status: Finalized Procedure:             Colonoscopy Indications:           High risk colon cancer surveillance: Personal history                         of colonic polyps Providers:             Andrey Farmer MD, MD Referring MD:          Janine Ores. Rosanna Randy, MD (Referring MD) Medicines:             Monitored Anesthesia Care Complications:         No immediate complications. Procedure:             Pre-Anesthesia Assessment:                        - Prior to the procedure, a History and Physical was                         performed, and patient medications and allergies were                         reviewed. The patient is competent. The risks and                         benefits of the procedure and the sedation options and                         risks were discussed with the patient. All questions                         were answered and informed consent was obtained.                         Patient identification and proposed procedure were                         verified by the physician, the nurse, the anesthetist                         and the technician in the endoscopy suite. Mental                         Status Examination: alert and oriented. Airway                         Examination: normal oropharyngeal airway and neck                         mobility. Respiratory Examination: clear to                         auscultation. CV Examination: normal. Prophylactic  Antibiotics: The patient does not require prophylactic                         antibiotics. Prior Anticoagulants: The patient has                         taken no previous anticoagulant or antiplatelet                         agents. ASA  Grade Assessment: III - A patient with                         severe systemic disease. After reviewing the risks and                         benefits, the patient was deemed in satisfactory                         condition to undergo the procedure. The anesthesia                         plan was to use monitored anesthesia care (MAC).                         Immediately prior to administration of medications,                         the patient was re-assessed for adequacy to receive                         sedatives. The heart rate, respiratory rate, oxygen                         saturations, blood pressure, adequacy of pulmonary                         ventilation, and response to care were monitored                         throughout the procedure. The physical status of the                         patient was re-assessed after the procedure.                        After obtaining informed consent, the colonoscope was                         passed under direct vision. Throughout the procedure,                         the patient's blood pressure, pulse, and oxygen                         saturations were monitored continuously. The                         Colonoscope was introduced through the anus and  advanced to the the cecum, identified by appendiceal                         orifice and ileocecal valve. The colonoscopy was                         performed without difficulty. The patient tolerated                         the procedure well. The quality of the bowel                         preparation was good. Findings:      The perianal and digital rectal examinations were normal.      There was evidence of a prior end-to-end ileo-colonic anastomosis at the       ileocecal valve. This was patent.      A few small-mouthed diverticula were found in the sigmoid colon,       descending colon, transverse colon and ascending colon.      The exam was otherwise  without abnormality on direct and retroflexion       views. Impression:            - Patent end-to-end ileo-colonic anastomosis.                        - Diverticulosis in the sigmoid colon, in the                         descending colon, in the transverse colon and in the                         ascending colon.                        - The examination was otherwise normal on direct and                         retroflexion views.                        - No specimens collected. Recommendation:        - Discharge patient to home.                        - Resume previous diet.                        - Continue present medications.                        - Repeat colonoscopy is not recommended due to current                         age (81 years or older) for surveillance.                        - Return to referring physician as previously                         scheduled. Procedure Code(s):     ---  Professional ---                        Y3016, Colorectal cancer screening; colonoscopy on                         individual at high risk Diagnosis Code(s):     --- Professional ---                        Z86.010, Personal history of colonic polyps                        Z98.0, Intestinal bypass and anastomosis status                        K57.30, Diverticulosis of large intestine without                         perforation or abscess without bleeding CPT copyright 2019 American Medical Association. All rights reserved. The codes documented in this report are preliminary and upon coder review may  be revised to meet current compliance requirements. Andrey Farmer, MD Andrey Farmer MD, MD 11/23/2020 1:04:15 PM Number of Addenda: 0 Note Initiated On: 11/23/2020 12:24 PM Scope Withdrawal Time: 0 hours 7 minutes 1 second  Total Procedure Duration: 0 hours 10 minutes 54 seconds  Estimated Blood Loss:  Estimated blood loss: none.      [facility]

## 2020-11-23 NOTE — H&P (Signed)
Outpatient short stay form Pre-procedure 11/23/2020 12:19 PM Crystal Miyamoto MD, MPH  Primary Physician: Dr. Rosanna Randy  Reason for visit:  Abnormal imaging  History of present illness:   79 y/o lady with history of nissen that was slipped and EGJ outflow obstruction here for EGD due to imaging concerning for possible neoplasm in fundus of stomach. Colonoscopy for history of polyps. Had ileocectomy due to appendiceal growth in the past. No blood thinners. No family history of GI malignancies. Has COPD.    Current Facility-Administered Medications:  .  0.9 %  sodium chloride infusion, , Intravenous, Continuous, Eland Lamantia, Hilton Cork, MD, Last Rate: 20 mL/hr at 11/23/20 1207, New Bag at 11/23/20 1207  Medications Prior to Admission  Medication Sig Dispense Refill Last Dose  . acetaminophen (TYLENOL) 500 MG tablet Take 1,000 mg by mouth every 6 (six) hours as needed (pain/headaches.).    Past Week at Unknown time  . amLODipine (NORVASC) 10 MG tablet Take 5 mg daily for one week. Then take 10 mg daily onward. 90 tablet 0 11/22/2020 at 2100  . BREO ELLIPTA 100-25 MCG/INH AEPB USE 1 INHALATION BY MOUTH  DAILY 180 each 3 11/23/2020 at 0930  . citalopram (CELEXA) 20 MG tablet Take 1 tablet (20 mg total) by mouth daily. (Patient taking differently: Take 20 mg by mouth at bedtime.) 30 tablet 11 11/22/2020 at 2100  . ezetimibe (ZETIA) 10 MG tablet TAKE 1 TABLET BY MOUTH  DAILY 90 tablet 3 11/22/2020 at 2100  . gabapentin (NEURONTIN) 100 MG capsule TAKE 2 CAPSULES BY MOUTH 3  TIMES DAILY 540 capsule 1 11/22/2020 at 2100  . hydrochlorothiazide (HYDRODIURIL) 25 MG tablet TAKE 1 TABLET BY MOUTH  DAILY 90 tablet 1 11/23/2020 at 0600  . losartan (COZAAR) 100 MG tablet TAKE 1 TABLET(100 MG) BY MOUTH DAILY 90 tablet 0 11/23/2020 at 0600  . meclizine (ANTIVERT) 25 MG tablet Take 1 tablet (25 mg total) by mouth 2 (two) times daily as needed for dizziness. 180 tablet 3 Past Month at Unknown time  . mirtazapine  (REMERON) 30 MG tablet TAKE 1 TABLET BY MOUTH AT  BEDTIME 90 tablet 3 11/22/2020 at 2100  . pantoprazole (PROTONIX) 40 MG tablet TAKE 1 TABLET BY MOUTH  TWICE DAILY 180 tablet 3 11/22/2020 at 2100  . traZODone (DESYREL) 150 MG tablet TAKE 1 TABLET BY MOUTH AT  BEDTIME 90 tablet 1 11/22/2020 at 2100  . albuterol (PROVENTIL HFA) 108 (90 Base) MCG/ACT inhaler Inhale 2 puffs into the lungs every 6 (six) hours as needed for wheezing or shortness of breath. 3 each 3   . HYDROcodone-acetaminophen (NORCO) 5-325 MG tablet Take 1 tablet by mouth every 6 (six) hours as needed. 20 tablet 0   . hyoscyamine (LEVBID) 0.375 MG 12 hr tablet Take 1 tablet (0.375 mg total) by mouth 2 (two) times daily as needed for cramping. Reported on 05/03/2016 (Patient not taking: No sig reported) 180 tablet 3 Not Taking at Unknown time  . loperamide (IMODIUM A-D) 2 MG tablet Take 2 mg by mouth at bedtime. (Patient not taking: No sig reported)   Not Taking at Unknown time  . OXYGEN Inhale into the lungs daily. Uses at night with CPAP and PRN during daytime     . Tiotropium Bromide Monohydrate (SPIRIVA RESPIMAT) 2.5 MCG/ACT AERS Inhale 2 puffs into the lungs daily for 1 day. (Patient not taking: Reported on 11/23/2020) 4 g 0 Not Taking at Unknown time     Allergies  Allergen Reactions  .  Iodinated Diagnostic Agents Other (See Comments)    Burning sensation during contrast media injections. Patient states the IV contrast makes her very hot. She has had multiple CT Scans with IV contrast and states she only gets the warm sensation. This is not an allergic reaction, but is noted in her [facility] chart as well. Patton Salles, ARRT R CT, had this discussion with her on 01/10/19 at 1130am.  . Bacitracin Swelling and Rash  . Statins Other (See Comments) and Rash    Reaction:  Leg cramps      Past Medical History:  Diagnosis Date  . Arthritis    hands, lower back  . Barrett's esophagus   . COPD (chronic obstructive pulmonary  disease) (HCC)   . Emphysema lung (HCC)   . GERD (gastroesophageal reflux disease)   . Hernia of abdominal cavity   . Hyperlipidemia   . Hypertension   . Mobitz type II atrioventricular block   . OSA (obstructive sleep apnea)    on CPAP  . Peripheral neuropathy   . Presence of permanent cardiac pacemaker 09/22/11   Biotronik - EVIADR-T [facility])  . Shortness of breath dyspnea   . Stomach ulcer     Review of systems:  Otherwise negative.    Physical Exam  Gen: Alert, oriented. Appears stated age.  HEENT: PERRLA. Lungs: No respiratory distress CV: RRR Abd: soft, benign, no masses Ext: No edema    Planned procedures: Proceed with EGD/colonoscopy. The patient understands the nature of the planned procedure, indications, risks, alternatives and potential complications including but not limited to bleeding, infection, perforation, damage to internal organs and possible oversedation/side effects from anesthesia. The patient agrees and gives consent to proceed.  Please refer to procedure notes for findings, recommendations and patient disposition/instructions.     Merlyn Lot MD, MPH Gastroenterology 11/23/2020  12:19 PM

## 2020-11-23 NOTE — Anesthesia Preprocedure Evaluation (Signed)
Anesthesia Evaluation  Patient identified by MRN, date of birth, ID band Patient awake    Reviewed: Allergy & Precautions, H&P , NPO status , Patient's Chart, lab work & pertinent test results, reviewed documented beta blocker date and time   Airway Mallampati: II   Neck ROM: full    Dental  (+) Poor Dentition   Pulmonary shortness of breath and with exertion, sleep apnea , COPD,  COPD inhaler, Current SmokerPatient did not abstain from smoking.,    Pulmonary exam normal        Cardiovascular Exercise Tolerance: Poor hypertension, On Medications Normal cardiovascular exam+ dysrhythmias + pacemaker  Rhythm:regular Rate:Normal     Neuro/Psych PSYCHIATRIC DISORDERS Anxiety Depression  Neuromuscular disease    GI/Hepatic Neg liver ROS, PUD, GERD  Medicated,  Endo/Other  negative endocrine ROS  Renal/GU Renal disease  negative genitourinary   Musculoskeletal   Abdominal   Peds  Hematology negative hematology ROS (+)   Anesthesia Other Findings Past Medical History: No date: Arthritis     Comment:  hands, lower back No date: Barrett's esophagus No date: COPD (chronic obstructive pulmonary disease) (HCC) No date: Emphysema lung (HCC) No date: GERD (gastroesophageal reflux disease) No date: Hernia of abdominal cavity No date: Hyperlipidemia No date: Hypertension No date: Mobitz type II atrioventricular block No date: OSA (obstructive sleep apnea)     Comment:  on CPAP No date: Peripheral neuropathy 09/22/11: Presence of permanent cardiac pacemaker     Comment:  [employer]ron[employer] - EVIADR-T [facility]) No date: Shortness of breath dyspnea No date: Stomach ulcer Past Surgical History: 2006: APPENDECTOMY     Comment:  Dr. Michela Pitcher 05/03/2016: BROW LIFT; Bilateral     Comment:  Procedure: BLEPHAROPLASTY  BILATERAL UPPER EYELIDS WITH               EXCESS SKIN REMOVAL BILATERAL BROW PTOSIS REPAIR               BLEPHAROPTOSIS REPAIR  BILATERAL EYES ;  Surgeon: Imagene Riches, MD;  Location: [facility];  Service:               Ophthalmology;  Laterality: Bilateral; 2007: CARDIAC CATHETERIZATION No date: COLON SURGERY 04/14/2015: COLONOSCOPY WITH PROPOFOL; N/A     Comment:  Procedure: COLONOSCOPY WITH PROPOFOL;  Surgeon: Christena Deem, MD;  Location: [facility] ENDOSCOPY;  Service:               Endoscopy;  Laterality: N/A; 01/24/2019: COLONOSCOPY WITH PROPOFOL; N/A     Comment:  Procedure: COLONOSCOPY WITH PROPOFOL;  Surgeon:               Christena Deem, MD;  Location: [facility] ENDOSCOPY;                Service: Endoscopy;  Laterality: N/A; 06/11/2019: COLONOSCOPY WITH PROPOFOL; N/A     Comment:  Procedure: COLONOSCOPY WITH PROPOFOL;  Surgeon:               Christena Deem, MD;  Location: [facility] ENDOSCOPY;                Service: Endoscopy;  Laterality: N/A; 04/14/2015: ESOPHAGOGASTRODUODENOSCOPY; N/A     Comment:  Procedure: ESOPHAGOGASTRODUODENOSCOPY (EGD);  Surgeon:               Christena Deem, MD;  Location: [facility] ENDOSCOPY;                Service: Endoscopy;  Laterality: N/A; 07/05/2017: ESOPHAGOGASTRODUODENOSCOPY (EGD) WITH PROPOFOL; N/A     Comment:  Procedure: ESOPHAGOGASTRODUODENOSCOPY (EGD) WITH               PROPOFOL;  Surgeon: Scot Jun, MD;  Location:               [facility] ENDOSCOPY;  Service: Endoscopy;  Laterality: N/A; 01/24/2019: ESOPHAGOGASTRODUODENOSCOPY (EGD) WITH PROPOFOL; N/A     Comment:  Procedure: ESOPHAGOGASTRODUODENOSCOPY (EGD) WITH               PROPOFOL;  Surgeon: Christena Deem, MD;  Location:               [facility] ENDOSCOPY;  Service: Endoscopy;  Laterality: N/A; No date: HERNIA REPAIR 2007: HIATAL HERNIA REPAIR No date: INSERT / REPLACE / REMOVE PACEMAKER 06/22/2015: LAPAROSCOPIC RIGHT HEMI COLECTOMY; Right     Comment:  Procedure: LAPAROSCOPIC RIGHT HEMI COLECTOMY;  Surgeon:               Ida Rogue, MD;  Location: [facility] ORS;   Service:              General;  Laterality: Right; 2007: NISSEN FUNDOPLICATION 08/22/2019: OPEN REDUCTION INTERNAL FIXATION (ORIF) DISTAL RADIAL  FRACTURE; Left     Comment:  Procedure: OPEN REDUCTION INTERNAL FIXATION (ORIF)               DISTAL RADIAL FRACTURE;  Surgeon: Kennedy Bucker, MD;                Location: [facility] ORS;  Service: Orthopedics;  Laterality:               Left; 09/22/11: PACEMAKER INSERTION     Comment:  [facility]  - [employer]ron[employer] EVIADR-T 1980: TOTAL ABDOMINAL HYSTERECTOMY BMI    Body Mass Index: 24.03 kg/m     Reproductive/Obstetrics negative OB ROS                             Anesthesia Physical Anesthesia Plan  ASA: III  Anesthesia Plan: General   Post-op Pain Management:    Induction:   PONV Risk Score and Plan:   Airway Management Planned:   Additional Equipment:   Intra-op Plan:   Post-operative Plan:   Informed Consent: I have reviewed the patients History and Physical, chart, labs and discussed the procedure including the risks, benefits and alternatives for the proposed anesthesia with the patient or authorized representative who has indicated his/her understanding and acceptance.     Dental Advisory Given  Plan Discussed with: CRNA  Anesthesia Plan Comments:         Anesthesia Quick Evaluation

## 2020-11-23 NOTE — Interval H&P Note (Signed)
History and Physical Interval Note:  11/23/2020 12:24 PM  Crystal White  has presented today for surgery, with the diagnosis of hx aden polyp abn CT.  The various methods of treatment have been discussed with the patient and family. After consideration of risks, benefits and other options for treatment, the patient has consented to  Procedure(s): COLONOSCOPY WITH PROPOFOL (N/A) ESOPHAGOGASTRODUODENOSCOPY (EGD) WITH PROPOFOL (N/A) as a surgical intervention.  The patient's history has been reviewed, patient examined, no change in status, stable for surgery.  I have reviewed the patient's chart and labs.  Questions were answered to the patient's satisfaction.     Regis Bill  Ok to proceed with EGD/Colonoscopy

## 2020-11-24 ENCOUNTER — Encounter: Payer: Self-pay | Admitting: Gastroenterology

## 2020-11-24 LAB — SURGICAL PATHOLOGY

## 2020-11-24 NOTE — Anesthesia Postprocedure Evaluation (Signed)
Anesthesia Post Note  Patient: Crystal White  Procedure(s) Performed: COLONOSCOPY WITH PROPOFOL (N/A ) ESOPHAGOGASTRODUODENOSCOPY (EGD) WITH PROPOFOL (N/A )  Patient location during evaluation: PACU Anesthesia Type: General Level of consciousness: awake and alert Pain management: pain level controlled Vital Signs Assessment: post-procedure vital signs reviewed and stable Respiratory status: spontaneous breathing, nonlabored ventilation, respiratory function stable and patient connected to nasal cannula oxygen Cardiovascular status: blood pressure returned to baseline and stable Postop Assessment: no apparent nausea or vomiting Anesthetic complications: no   No complications documented.   Last Vitals:  Vitals:   11/23/20 1318 11/23/20 1328  BP: (!) 150/58 (!) 158/60  Pulse: 68 84  Resp: 19 (!) 22  Temp:    SpO2: 92% (!) 89%    Last Pain:  Vitals:   11/24/20 0734  TempSrc:   PainSc: 0-No pain                 Yevette Edwards

## 2020-12-14 ENCOUNTER — Telehealth: Payer: Self-pay | Admitting: Family Medicine

## 2020-12-14 ENCOUNTER — Ambulatory Visit: Payer: Self-pay | Admitting: Family Medicine

## 2020-12-17 ENCOUNTER — Telehealth: Payer: Self-pay | Admitting: *Deleted

## 2020-12-17 NOTE — Chronic Care Management (AMB) (Signed)
  Chronic Care Management   Note  12/17/2020 Name: TAWN FITZNER MRN: [id number] DOB: 1941-02-11  Crystal White is a 80 y.o. year old female who is a primary care patient of Jerrol Banana., MD. I reached out to [name] by phone today in response to a referral sent by Ms. Yetunde L Rondeau's health plan.     Ms. Gopal was given information about Chronic Care Management services today including:  1. CCM service includes personalized support from designated clinical staff supervised by her physician, including individualized plan of care and coordination with other care providers 2. 24/7 contact phone numbers for assistance for urgent and routine care needs. 3. Service will only be billed when office clinical staff spend 20 minutes or more in a month to coordinate care. 4. Only one practitioner may furnish and bill the service in a calendar month. 5. The patient may stop CCM services at any time (effective at the end of the month) by phone call to the office staff. 6. The patient will be responsible for cost sharing (co-pay) of up to 20% of the service fee (after annual deductible is met).  Patient agreed to services and verbal consent obtained.   Follow up plan: Telephone appointment with care management team member scheduled for: 01/12/2021  [facility]

## 2020-12-21 ENCOUNTER — Telehealth (INDEPENDENT_AMBULATORY_CARE_PROVIDER_SITE_OTHER): Payer: Medicare Other | Admitting: Family Medicine

## 2020-12-21 DIAGNOSIS — J438 Other emphysema: Secondary | ICD-10-CM

## 2020-12-21 DIAGNOSIS — I10 Essential (primary) hypertension: Secondary | ICD-10-CM

## 2020-12-21 DIAGNOSIS — K22719 Barrett's esophagus with dysplasia, unspecified: Secondary | ICD-10-CM | POA: Diagnosis not present

## 2020-12-21 DIAGNOSIS — N1832 Chronic kidney disease, stage 3b: Secondary | ICD-10-CM

## 2020-12-21 DIAGNOSIS — R600 Localized edema: Secondary | ICD-10-CM | POA: Diagnosis not present

## 2020-12-21 DIAGNOSIS — R252 Cramp and spasm: Secondary | ICD-10-CM

## 2020-12-21 NOTE — Progress Notes (Signed)
MyChart Video Visit    Virtual Visit via Video Note   This visit type was conducted due to national recommendations for restrictions regarding the COVID-19 Pandemic (e.g. social distancing) in an effort to limit this patient's exposure and mitigate transmission in our community. This patient is at least at moderate risk for complications without adequate follow up. This format is felt to be most appropriate for this patient at this time. Physical exam was limited by quality of the video and audio technology used for the visit.   Patient location: home Provider location: office  I discussed the limitations of evaluation and management by telemedicine and the availability of in person appointments. The patient expressed understanding and agreed to proceed.  Patient: Crystal White   DOB: [birth year]   80 y.o. Female  MRN: [id number] Visit Date: 12/21/2020  Today's healthcare provider: Megan Mans, MD   No chief complaint on file.  Subjective    HPI  Right ankle swelling for past 2 weeks.Goes down overnight and with elevation.No SOB or edema otherwise.  Hypertension, follow-up  BP Readings from Last 3 Encounters:  11/23/20 (!) 158/60  11/12/20 (!) 191/72  11/11/20 (!) 210/89   Wt Readings from Last 3 Encounters:  11/23/20 140 lb (63.5 kg)  11/12/20 146 lb 6.4 oz (66.4 kg)  11/11/20 139 lb 0.1 oz (63.1 kg)     She was last seen for hypertension 1 months ago.  BP at that visit was 191/72. Management since that visit includes amLODipine (NORVASC) 10 MG tablet; Take 5 mg daily for one week. Then take 10 mg daily onward according to Osvaldo Angst PA-C instructions.  BP: 137/62 at 11:52 am today.  She reports poor compliance with treatment. Pt says the amlodipine was making her feel very fatigued so she no longer is taking it. She is having side effects.  She is following a Regular diet. She is not exercising. She does smoke.  Use of agents associated with  hypertension: none.   Outside blood pressures are 137/62.  Symptoms: No chest pain No chest pressure  No palpitations No syncope  No dyspnea No orthopnea  No paroxysmal nocturnal dyspnea Yes lower extremity edema   Pertinent labs: Lab Results  Component Value Date   CHOL 232 (H) 08/11/2020   HDL 52 08/11/2020   LDLCALC 148 (H) 08/11/2020   TRIG 180 (H) 08/11/2020   CHOLHDL 4.5 (H) 08/11/2020   Lab Results  Component Value Date   NA 141 11/11/2020   K 4.1 11/11/2020   CREATININE 1.83 (H) 11/11/2020   GFRNONAA 28 (L) 11/11/2020   GFRAA 31 (L) 08/11/2020   GLUCOSE 98 11/11/2020     The 10-year ASCVD risk score (Goff DC Jr., et al., 2013) is: 49.9%   ---------------------------------------------------------------------------------------------------   Patient Active Problem List   Diagnosis Date Noted   Incisional hernia, without obstruction or gangrene 06/03/2016   Osteoporosis 02/22/2016   Musculoskeletal chest pain 07/14/2015   Chronic respiratory failure (HCC) 07/14/2015   Acute bronchitis 07/14/2015   Other emphysema (HCC) 07/14/2015   Chronic renal insufficiency 07/14/2015   Chest pain 07/13/2015   Multiple pulmonary nodules 06/02/2015   Colon polyp 05/29/2015   Hemoptysis 04/29/2015   Sprain of ankle 04/14/2015   Anxiety 04/14/2015   Barrett's esophagus 04/14/2015   Acute exacerbation of chronic obstructive airways disease (HCC) 04/14/2015   Bursitis 04/14/2015   Cellulitis 04/14/2015   Claudication (HCC) 04/14/2015   Cough 04/14/2015   Deficiency, 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  Clinical depression 04/14/2015   Dizziness 04/14/2015   Dyslipidemia 04/14/2015   Essential (primary) hypertension 04/14/2015   Flank pain 04/14/2015   H/O suicide attempt 04/14/2015   Dysphonia 04/14/2015   Hypercholesteremia 04/14/2015   Cannot sleep 04/14/2015   Malaise and fatigue 04/14/2015   Mild cognitive impairment 04/14/2015    Cramp in muscle 04/14/2015   Muscle ache 04/14/2015   Neuropathy 04/14/2015   Adiposity 04/14/2015   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   Candida infection of mouth 04/14/2015   Abnormal loss of weight 04/14/2015   Decreased body weight 04/14/2015   Artificial cardiac pacemaker 10/08/2014   Apnea, sleep 10/08/2014   COPD (chronic obstructive pulmonary disease) ([place]) 02/13/2012   Tobacco abuse 02/13/2012   Sinus congestion 02/13/2012   Current tobacco use 02/13/2012   Esophagitis, reflux 08/28/2006   Abdominal pain, right lower quadrant 08/22/2005   Acute appendicitis with peritoneal abscess 08/22/2005   Past Medical History:  Diagnosis Date   Arthritis    hands, lower back   Barrett's esophagus    COPD (chronic obstructive pulmonary disease) (HCC)    Emphysema lung (HCC)    GERD (gastroesophageal reflux disease)    Hernia of abdominal cavity    Hyperlipidemia    Hypertension    Mobitz type II atrioventricular block    OSA (obstructive sleep apnea)    on CPAP   Peripheral neuropathy    Presence of permanent cardiac pacemaker 09/22/11   Biotronik - [facility]R-[facility])   Shortness of breath dyspnea    Stomach ulcer    Family History  Adopted: Yes  Problem Relation Age of Onset   Diabetes Son    Allergies  Allergen Reactions   Iodinated Diagnostic Agents Other (See Comments)    Burning sensation during contrast media injections. Patient states the IV contrast makes her very hot. She has had multiple CT Scans with IV contrast and states she only gets the warm sensation. This is not an allergic reaction, but is noted in her [place] chart as well. Aggie Hacker, ARRT R CT, had this discussion with her on 01/10/19 at 1130am.   Bacitracin Swelling and Rash   Statins Other (See Comments) and Rash    Reaction:  Leg cramps       Medications: Outpatient Medications Prior to Visit  Medication Sig   acetaminophen (TYLENOL) 500 MG tablet Take  1,000 mg by mouth every 6 (six) hours as needed (pain/headaches.).    albuterol (PROVENTIL HFA) 108 (90 Base) MCG/ACT inhaler Inhale 2 puffs into the lungs every 6 (six) hours as needed for wheezing or shortness of breath.   amLODipine (NORVASC) 10 MG tablet Take 5 mg daily for one week. Then take 10 mg daily onward.   BREO ELLIPTA 100-25 MCG/INH AEPB USE 1 INHALATION BY MOUTH  DAILY   citalopram (CELEXA) 20 MG tablet Take 1 tablet (20 mg total) by mouth daily. (Patient taking differently: Take 20 mg by mouth at bedtime.)   ezetimibe (ZETIA) 10 MG tablet TAKE 1 TABLET BY MOUTH  DAILY   gabapentin (NEURONTIN) 100 MG capsule TAKE 2 CAPSULES BY MOUTH 3  TIMES DAILY   hydrochlorothiazide (HYDRODIURIL) 25 MG tablet TAKE 1 TABLET BY MOUTH  DAILY   HYDROcodone-acetaminophen (NORCO) 5-325 MG tablet Take 1 tablet by mouth every 6 (six) hours as needed.   hyoscyamine (LEVBID) 0.375 MG 12 hr tablet Take 1 tablet (0.375 mg total) by mouth 2 (two) times daily as needed for cramping. Reported on 05/03/2016 (Patient not taking: No  sig reported)   loperamide (IMODIUM A-D) 2 MG tablet Take 2 mg by mouth at bedtime. (Patient not taking: No sig reported)   losartan (COZAAR) 100 MG tablet TAKE 1 TABLET(100 MG) BY MOUTH DAILY   meclizine (ANTIVERT) 25 MG tablet Take 1 tablet (25 mg total) by mouth 2 (two) times daily as needed for dizziness.   mirtazapine (REMERON) 30 MG tablet TAKE 1 TABLET BY MOUTH AT  BEDTIME   OXYGEN Inhale into the lungs daily. Uses at night with CPAP and PRN during daytime   pantoprazole (PROTONIX) 40 MG tablet TAKE 1 TABLET BY MOUTH  TWICE DAILY   Tiotropium Bromide Monohydrate (SPIRIVA RESPIMAT) 2.5 MCG/ACT AERS Inhale 2 puffs into the lungs daily for 1 day. (Patient not taking: Reported on 11/23/2020)   traZODone (DESYREL) 150 MG tablet TAKE 1 TABLET BY MOUTH AT  BEDTIME   No facility-administered medications prior to visit.    Review of Systems  Last metabolic panel Lab  Results  Component Value Date   GLUCOSE 98 11/11/2020   NA 141 11/11/2020   K 4.1 11/11/2020   CL 102 11/11/2020   CO2 28 11/11/2020   BUN 40 (H) 11/11/2020   CREATININE 1.83 (H) 11/11/2020   GFRNONAA 28 (L) 11/11/2020   GFRAA 31 (L) 08/11/2020   CALCIUM 8.9 11/11/2020   PROT 6.9 08/11/2020   ALBUMIN 4.3 08/11/2020   LABGLOB 2.6 08/11/2020   AGRATIO 1.7 08/11/2020   BILITOT 0.3 08/11/2020   ALKPHOS 89 08/11/2020   AST 9 08/11/2020   ALT 6 08/11/2020   ANIONGAP 11 11/11/2020      Objective    There were no vitals taken for this visit. BP Readings from Last 3 Encounters:  11/23/20 (!) 158/60  11/12/20 (!) 191/72  11/11/20 (!) 210/89   Wt Readings from Last 3 Encounters:  11/23/20 140 lb (63.5 kg)  11/12/20 146 lb 6.4 oz (66.4 kg)  11/11/20 139 lb 0.1 oz (63.1 kg)      Physical Exam  Patient is comfortable on the phone call.  Breathing fine.  Alert and oriented.   Assessment & Plan     1. Pedal edema This is only ankle and feet later in the day.  No intervention at this time other than elevation and support hose. I do not think she is describing heart failure and I certainly do not think she is describing a clot  2. Other emphysema ([place]) Patient advised to quit smoking  3. Barrett's esophagus with dysplasia   4. Chronic renal impairment, stage 3b (HCC)   5. Essential (primary) hypertension   6. Cramps of lower extremity Discussed most multiple treatment such as bananas, mustard, tonic water.  Presently this is a mild problem.    No follow-ups on file.     I discussed the assessment and treatment plan with the patient. The patient was provided an opportunity to ask questions and all were answered. The patient agreed with the plan and demonstrated an understanding of the instructions.   The patient was advised to call back or seek an in-person evaluation if the symptoms worsen or if the condition fails to improve as anticipated.  I provided 12 minutes  of non-face-to-face time during this encounter.  I, [name], MD, have reviewed all documentation for this visit. The documentation on 12/22/20 for the exam, diagnosis, procedures, and orders are all accurate and complete.   Rondell Frick Cranford Mon, MD [facility] [phone] (phone) [phone] (fax)  [facility]

## 2021-01-12 ENCOUNTER — Ambulatory Visit (INDEPENDENT_AMBULATORY_CARE_PROVIDER_SITE_OTHER): Payer: Medicare Other

## 2021-01-12 DIAGNOSIS — R296 Repeated falls: Secondary | ICD-10-CM

## 2021-01-12 DIAGNOSIS — I10 Essential (primary) hypertension: Secondary | ICD-10-CM | POA: Diagnosis not present

## 2021-01-12 DIAGNOSIS — J449 Chronic obstructive pulmonary disease, unspecified: Secondary | ICD-10-CM | POA: Diagnosis not present

## 2021-01-12 NOTE — Chronic Care Management (AMB) (Signed)
Chronic Care Management   Initial Visit Note  01/12/2021 Name: Crystal White MRN: [id number] DOB: [birth year]  Primary Care Provider: Jerrol Banana., MD Reason for referral : Chronic Care Management  Crystal White is a 80 y.o. year old female who is a primary care patient of Jerrol Banana., MD. The CCM team was consulted for assistance with chronic disease management and care coordination. A telephonic outreach was conducted today.  Review of Crystal White's status, including review of consultants reports, relevant labs and test results was conducted today. Collaboration with appropriate care team members was performed as part of the comprehensive evaluation and provision of chronic care management services.    SDOH (Social Determinants of Health) assessments performed: Yes See Care Plan activities for detailed interventions related to SDOH  SDOH Interventions   Flowsheet Row Most Recent Value  SDOH Interventions   Food Insecurity Interventions Intervention Not Indicated  Stress Interventions Intervention Not Indicated  Transportation Interventions Intervention Not Indicated  Alcohol Brief Interventions/Follow-up AUDIT Score <7 follow-up not indicated       Medications: Outpatient Encounter Medications as of 01/12/2021  Medication Sig Note  . acetaminophen (TYLENOL) 500 MG tablet Take 1,000 mg by mouth every 6 (six) hours as needed (pain/headaches.).    Marland Kitchen albuterol (PROVENTIL HFA) 108 (90 Base) MCG/ACT inhaler Inhale 2 puffs into the lungs every 6 (six) hours as needed for wheezing or shortness of breath.   Marland Kitchen BREO ELLIPTA 100-25 MCG/INH AEPB USE 1 INHALATION BY MOUTH  DAILY   . gabapentin (NEURONTIN) 100 MG capsule TAKE 2 CAPSULES BY MOUTH 3  TIMES DAILY   . hydrochlorothiazide (HYDRODIURIL) 25 MG tablet TAKE 1 TABLET BY MOUTH  DAILY   . losartan (COZAAR) 100 MG tablet TAKE 1 TABLET(100 MG) BY MOUTH DAILY   . meclizine (ANTIVERT) 25 MG tablet Take 1 tablet (25 mg total)  by mouth 2 (two) times daily as needed for dizziness. 01/24/2017: As needed  . mirtazapine (REMERON) 30 MG tablet TAKE 1 TABLET BY MOUTH AT  BEDTIME   . OXYGEN Inhale into the lungs daily. Uses at night with CPAP and PRN during daytime   . pantoprazole (PROTONIX) 40 MG tablet TAKE 1 TABLET BY MOUTH  TWICE DAILY   . traZODone (DESYREL) 150 MG tablet TAKE 1 TABLET BY MOUTH AT  BEDTIME   . amLODipine (NORVASC) 10 MG tablet Take 5 mg daily for one week. Then take 10 mg daily onward. (Patient not taking: Reported on 01/12/2021) 01/12/2021: Reports discussing with Dr. Rosanna Randy  . citalopram (CELEXA) 20 MG tablet Take 1 tablet (20 mg total) by mouth daily. (Patient taking differently: Take 20 mg by mouth at bedtime.)   . ezetimibe (ZETIA) 10 MG tablet TAKE 1 TABLET BY MOUTH  DAILY   . HYDROcodone-acetaminophen (NORCO) 5-325 MG tablet Take 1 tablet by mouth every 6 (six) hours as needed.   . hyoscyamine (LEVBID) 0.375 MG 12 hr tablet Take 1 tablet (0.375 mg total) by mouth 2 (two) times daily as needed for cramping. Reported on 05/03/2016 (Patient not taking: No sig reported)   . loperamide (IMODIUM A-D) 2 MG tablet Take 2 mg by mouth at bedtime. (Patient not taking: No sig reported)   . Tiotropium Bromide Monohydrate (SPIRIVA RESPIMAT) 2.5 MCG/ACT AERS Inhale 2 puffs into the lungs daily for 1 day. (Patient not taking: Reported on 11/23/2020)    No facility-administered encounter medications on file as of 01/12/2021.     Objective:  Patient Care Plan:  COPD (Adult)    Problem Identified: Symptom Exacerbation (COPD)     Long-Range Goal: Symptom Exacerbation Prevented or Minimized   Start Date: 01/12/2021  Expected End Date: 05/12/2021  Priority: High  Note:   Current Barriers:  . Chronic Disease Management needs and support r/t COPD self-management  Case Manager Clinical Goal(s):  Over the next 120 days, patient will not require hospitalization or emergent care d/t complications r/t             COPD  exacerbation.   Interventions:  . Collaboration with Jerrol Banana., MD regarding development and update of comprehensive plan of care as evidenced by provider attestation and co-signature . Inter-disciplinary care team collaboration (see longitudinal plan of care)  Reviewed medications. Encouraged to take medications and use inhalers as prescribed. Encouraged to notify provider if unable to tolerate prescribed regimen. Encouraged to update the care management team with concerns regarding prescription costs.  Discussed activity tolerance and safety measures. Encouraged to continue mild, low impact exercises as tolerated and avoid overexertion.  Discussed recommendations regarding COPD self care management and exacerbation prevention.  Discussed risks for complications r/t continued tobacco use. Discussed available resources for smoking cessation.  Advised to monitor symptoms daily. Advised to notify provider for evaluation if in the yellow zone and  symptoms are not improving.  Reviewed worsening/unrelieved s/sx that require immediate medical attention.  Discussed information regarding strategies to reduce risk of respiratory infection.  Patient Self Care Activities:  Over the next 120 days, patient will:  Self administer medications and utilize inhalers as prescribed  Monitor symptoms and follow recommended COPD Action plan  Consider plan for smoking cessation  Avoid known COPD "triggers" and activities that may cause overexertion.  Contact provider or care management team with new questions  and concerns   Follow Up Plan: Will follow-up in two months     Patient Care Plan: Hypertension (Adult)    Problem Identified: Hypertension (Hypertension)     Long-Range Goal: Hypertension Monitored   Start Date: 01/12/2021  Expected End Date: 05/12/2021  Priority: High  Note:   Objective:  . Last practice recorded BP readings:  BP Readings from Last 3 Encounters:   11/23/20 (!) 158/60  11/12/20 (!) 191/72  11/11/20 (!) 210/89 .   Marland Kitchen Most recent eGFR/CrCl: No results found for: EGFR  No components found for: CRCL  Current Barriers:  . Chronic Disease Management needs and support r/t Hypertension self management.  Case Manager Clinical Goal(s):   Over the next 120 days, patient will demonstrate improved adherence to prescribed treatment plan for hypertension management as evidenced by taking all medications as prescribed, monitoring and recording blood pressure, and adhering to a heart healthy/cardiac prudent diet.  Interventions:   Collaboration with Jerrol Banana., MD regarding development and update of comprehensive plan of care as evidenced by provider attestation and co-signature  Inter-disciplinary care team collaboration (see longitudinal plan of care)  Discussed current treatment plan related to hypertension self management.  Reviewed medications and indications for use. Advised to continue taking medications as prescribed.  Discussed nutritional intake and importance of adhering to a heart healthy/cardiac prudent diet. Encouraged to read nutrition labels and avoid processed foods when possible.   Provided information regarding established blood pressure range. Advised to continue monitoring BP and record readings.  Discussed s/sx of heart attack, stroke and complications r/t uncontrolled BP.  Advised to notify provider with home BP readings outside of the established range.  Patient Goals/Self-Care Activities Over  the next 120 days, patient will:  - Self administer medications as prescribed - Consistently monitor BP and record readings. - Attend scheduled provider appointments and clinic BP checks when indicated - Follow recommended heart healthy/cardiac prudent diet - Contact provider or care management team with new questions and concerns  Follow Up Plan:  Will follow up in two months   Patient Care Plan: Fall Risk     Problem Identified: High Risk for Falls     Long-Range Goal: Fall and  Injury Prevention   Start Date: 01/12/2021  Expected End Date: 05/12/2021  Priority: High  Note:   Current Barriers:  . High Risk for fall related injuries d/t Osteoporosis.  Clinical Goal(s):  Marland Kitchen Over the next 120 days, patient will not experience accidental falls or require emergent care d/t fall related injuries.  Interventions:  . Collaboration with Jerrol Banana., MD regarding development and update of comprehensive plan of care as evidenced by provider attestation and co-signature . Inter-disciplinary care team collaboration (see longitudinal plan of care) . Reviewed medications and discussed potential side effects such as dizziness and frequent urination . Provided information regarding safety and fall prevention measures.  . Discussed mobility status. Encouraged to use or keep an assistive device readily available when ambulating . Discussed availability of members to assist in the home when needed. Encourage to avoid strenuous tasks/attempting to lift heavy items when alone. . Encouraged to notify provider if ability to ambulate declines  Patient Goals/Self-Care Activities Over the next 120 days, patient will:   - Utilize assistive device appropriately with all ambulation - Ensure pathways are clear and well lit - Change positions slowly and demonstrate self awareness at all times - Wear secure non skid footwear when ambulating  Follow Up Plan:  Will follow up in two months    Crystal White was given information about Chronic Care Management services including:  1. CCM service includes personalized support from designated clinical staff supervised by her physician, including individualized plan of care and coordination with other care providers 2. 24/7 contact phone numbers for assistance for urgent and routine care needs. 3. Service will only be billed when office clinical staff spend 20 minutes or  more in a month to coordinate care. 4. Only one practitioner may furnish and bill the service in a calendar month. 5. The patient may stop CCM services at any time (effective at the end of the month) by phone call to the office staff. 6. The patient will be responsible for cost sharing (co-pay) of up to 20% of the service fee (after annual deductible is met).  Patient agreed to services and verbal consent obtained.     PLAN A member of the care management team will follow-up with Crystal White in two months.  [name] Care Management [facility] [phone]

## 2021-01-13 NOTE — Patient Instructions (Addendum)
Thank you for allowing the Chronic Care Management team to participate in your care.   Patient Care Plan: COPD (Adult)    Problem Identified: Symptom Exacerbation (COPD)     Long-Range Goal: Symptom Exacerbation Prevented or Minimized   Start Date: 01/12/2021  Expected End Date: 05/12/2021  Priority: High  Note:   Current Barriers:  . Chronic Disease Management needs and support r/t COPD self-management  Case Manager Clinical Goal(s):  Over the next 120 days, patient will not require hospitalization or emergent care d/t complications r/t             COPD exacerbation.   Interventions:  . Collaboration with Jerrol Banana., MD regarding development and update of comprehensive plan of care as evidenced by provider attestation and co-signature . Inter-disciplinary care team collaboration (see longitudinal plan of care)  Reviewed medications. Encouraged to take medications and use inhalers as prescribed. Encouraged to notify provider if unable to tolerate prescribed regimen. Encouraged to update the care management team with concerns regarding prescription costs.  Discussed activity tolerance and safety measures. Encouraged to continue mild, low impact exercises as tolerated and avoid overexertion.  Discussed recommendations regarding COPD self care management and exacerbation prevention.  Discussed risks for complications r/t continued tobacco use. Discussed available resources for smoking cessation.  Advised to monitor symptoms daily. Advised to notify provider for evaluation if in the yellow zone and  symptoms are not improving.  Reviewed worsening/unrelieved s/sx that require immediate medical attention.  Discussed information regarding strategies to reduce risk of respiratory infection.  Patient Self Care Activities:  Over the next 120 days, patient will:  Self administer medications and utilize inhalers as prescribed  Monitor symptoms and follow recommended COPD  Action plan  Consider plan for smoking cessation  Avoid known COPD "triggers" and activities that may cause overexertion.  Contact provider or care management team with new questions  and concerns   Follow Up Plan: Will follow-up in two months     Patient Care Plan: Hypertension (Adult)    Problem Identified: Hypertension (Hypertension)     Long-Range Goal: Hypertension Monitored   Start Date: 01/12/2021  Expected End Date: 05/12/2021  Priority: High  Note:   Objective:  . Last practice recorded BP readings:  BP Readings from Last 3 Encounters:  11/23/20 (!) 158/60  11/12/20 (!) 191/72  11/11/20 (!) 210/89 .   Marland Kitchen Most recent eGFR/CrCl: No results found for: EGFR  No components found for: CRCL  Current Barriers:  . Chronic Disease Management needs and support r/t Hypertension self management.  Case Manager Clinical Goal(s):   Over the next 120 days, patient will demonstrate improved adherence to prescribed treatment plan for hypertension management as evidenced by taking all medications as prescribed, monitoring and recording blood pressure, and adhering to a heart healthy/cardiac prudent diet.  Interventions:   Collaboration with Jerrol Banana., MD regarding development and update of comprehensive plan of care as evidenced by provider attestation and co-signature  Inter-disciplinary care team collaboration (see longitudinal plan of care)  Discussed current treatment plan related to hypertension self management.  Reviewed medications and indications for use. Advised to continue taking medications as prescribed.  Discussed nutritional intake and importance of adhering to a heart healthy/cardiac prudent diet. Encouraged to read nutrition labels and avoid processed foods when possible.   Provided information regarding established blood pressure range. Advised to continue monitoring BP and record readings.  Discussed s/sx of heart attack, stroke and complications  r/t uncontrolled BP.  Advised to notify provider with home BP readings outside of the established range.  Patient Goals/Self-Care Activities Over the next 120 days, patient will:  - Self administer medications as prescribed - Consistently monitor BP and record readings. - Attend scheduled provider appointments and clinic BP checks when indicated - Follow recommended heart healthy/cardiac prudent diet - Contact provider or care management team with new questions and concerns  Follow Up Plan:  Will follow up in two months   Patient Care Plan: Fall Risk    Problem Identified: High Risk for Falls     Long-Range Goal: Fall and  Injury Prevention   Start Date: 01/12/2021  Expected End Date: 05/12/2021  Priority: High  Note:   Current Barriers:  . High Risk for fall related injuries d/t Osteoporosis.  Clinical Goal(s):  Marland Kitchen Over the next 120 days, patient will not experience accidental falls or require emergent care d/t fall related injuries.  Interventions:  . Collaboration with Jerrol Banana., MD regarding development and update of comprehensive plan of care as evidenced by provider attestation and co-signature . Inter-disciplinary care team collaboration (see longitudinal plan of care) . Reviewed medications and discussed potential side effects such as dizziness and frequent urination . Provided information regarding safety and fall prevention measures.  . Discussed mobility status. Encouraged to use or keep an assistive device readily available when ambulating . Discussed availability of members to assist in the home when needed. Encourage to avoid strenuous tasks/attempting to lift heavy items when alone. . Encouraged to notify provider if ability to ambulate declines  Patient Goals/Self-Care Activities Over the next 120 days, patient will:   - Utilize assistive device appropriately with all ambulation - Ensure pathways are clear and well lit - Change positions slowly and  demonstrate self awareness at all times - Wear secure non skid footwear when ambulating  Follow Up Plan:  Will follow up in two months    Crystal White was given information about Chronic Care Management services including:  1. CCM service includes personalized support from designated clinical staff supervised by her physician, including individualized plan of care and coordination with other care providers 2. 24/7 contact phone numbers for assistance for urgent and routine care needs. 3. Service will only be billed when office clinical staff spend 20 minutes or more in a month to coordinate care. 4. Only one practitioner may furnish and bill the service in a calendar month. 5. The patient may stop CCM services at any time (effective at the end of the month) by phone call to the office staff. 6. The patient will be responsible for cost sharing (co-pay) of up to 20% of the service fee (after annual deductible is met).  Patient agreed to services and verbal consent obtained.    Crystal White verbalized understanding of the information discussed during the telephonic outreach today. Declined need for mailed/printed information. A member of the care management team will follow-up in two months.   [name] Care Management [facility] [phone]

## 2021-01-29 ENCOUNTER — Other Ambulatory Visit
Admission: RE | Admit: 2021-01-29 | Discharge: 2021-01-29 | Disposition: A | Discharge status: Home / Self Care | Payer: Medicare Other | Source: Ambulatory Visit | Attending: Cardiology | Admitting: Cardiology

## 2021-01-29 ENCOUNTER — Other Ambulatory Visit: Payer: Self-pay

## 2021-01-29 DIAGNOSIS — Z20822 Contact with and (suspected) exposure to covid-19: Secondary | ICD-10-CM | POA: Diagnosis not present

## 2021-01-29 DIAGNOSIS — Z01812 Encounter for preprocedural laboratory examination: Secondary | ICD-10-CM | POA: Insufficient documentation

## 2021-01-30 LAB — SARS CORONAVIRUS 2 (TAT 6-24 HRS): SARS Coronavirus 2: NEGATIVE

## 2021-02-02 ENCOUNTER — Encounter
Admission: RE | Disposition: A | Discharge status: Home / Self Care | Payer: Self-pay | Source: Ambulatory Visit | Attending: Cardiology

## 2021-02-02 ENCOUNTER — Other Ambulatory Visit: Payer: Self-pay

## 2021-02-02 ENCOUNTER — Encounter: Payer: Self-pay | Admitting: Cardiology

## 2021-02-02 ENCOUNTER — Ambulatory Visit
Admission: RE | Admit: 2021-02-02 | Discharge: 2021-02-02 | Disposition: A | Discharge status: Home / Self Care | Payer: Medicare Other | Source: Ambulatory Visit | Attending: Cardiology | Admitting: Cardiology

## 2021-02-02 DIAGNOSIS — Z45010 Encounter for checking and testing of cardiac pacemaker pulse generator [battery]: Secondary | ICD-10-CM | POA: Diagnosis not present

## 2021-02-02 DIAGNOSIS — Z45018 Encounter for adjustment and management of other part of cardiac pacemaker: Secondary | ICD-10-CM

## 2021-02-02 HISTORY — PX: PPM GENERATOR CHANGEOUT: EP1233

## 2021-02-02 SURGERY — PPM GENERATOR CHANGEOUT
Anesthesia: Moderate Sedation

## 2021-02-02 MED ORDER — CHLORHEXIDINE GLUCONATE CLOTH 2 % EX PADS: 6.0000 | MEDICATED_PAD | Freq: Every day | CUTANEOUS | Status: DC

## 2021-02-02 MED ORDER — HEPARIN (PORCINE) IN NACL 1000-0.9 UT/500ML-% IV SOLN: INTRAVENOUS | Status: DC | PRN

## 2021-02-02 MED ORDER — CEFAZOLIN SODIUM-DEXTROSE 2-4 GM/100ML-% IV SOLN
2.0000 g | INTRAVENOUS | Status: AC
  Administered 2021-02-02: 2 g via INTRAVENOUS

## 2021-02-02 MED ORDER — CEPHALEXIN 250 MG PO CAPS: 500.0000 mg | ORAL_CAPSULE | Freq: Two times a day (BID) | ORAL | 0 refills | Status: DC

## 2021-02-02 MED ORDER — LIDOCAINE HCL (PF) 1 % IJ SOLN
INTRAMUSCULAR | Status: AC
  Filled 2021-02-02: qty 30

## 2021-02-02 MED ORDER — MIDAZOLAM HCL 2 MG/2ML IJ SOLN
INTRAMUSCULAR | Status: AC
  Filled 2021-02-02: qty 2

## 2021-02-02 MED ORDER — CEFAZOLIN SODIUM-DEXTROSE 2-4 GM/100ML-% IV SOLN
INTRAVENOUS | Status: AC
  Filled 2021-02-02: qty 100

## 2021-02-02 MED ORDER — FENTANYL CITRATE (PF) 100 MCG/2ML IJ SOLN
INTRAMUSCULAR | Status: AC
  Filled 2021-02-02: qty 2

## 2021-02-02 MED ORDER — MIDAZOLAM HCL 2 MG/2ML IJ SOLN
INTRAMUSCULAR | Status: DC | PRN
  Administered 2021-02-02: 0.5 mg via INTRAVENOUS
  Administered 2021-02-02: 1 mg via INTRAVENOUS

## 2021-02-02 MED ORDER — FENTANYL CITRATE (PF) 100 MCG/2ML IJ SOLN
INTRAMUSCULAR | Status: DC | PRN
  Administered 2021-02-02: 12.5 ug via INTRAVENOUS
  Administered 2021-02-02: 25 ug via INTRAVENOUS

## 2021-02-02 MED ORDER — SODIUM CHLORIDE 0.45 % IV SOLN: INTRAVENOUS | Status: DC

## 2021-02-02 MED ORDER — LIDOCAINE HCL (PF) 1 % IJ SOLN
INTRAMUSCULAR | Status: DC | PRN
  Administered 2021-02-02: 20 mL

## 2021-02-02 MED ORDER — SODIUM CHLORIDE 0.9 % IV SOLN
80.0000 mg | INTRAVENOUS | Status: AC
  Administered 2021-02-02: 80 mg
  Filled 2021-02-02: qty 80

## 2021-02-02 MED ORDER — SODIUM CHLORIDE 0.9 % IV SOLN: INTRAVENOUS | Status: DC

## 2021-02-02 SURGICAL SUPPLY — 8 items
CABLE SURG 12 DISP A/V CHANNEL (MISCELLANEOUS) ×2 IMPLANT
COVER SURGICAL LIGHT HANDLE (MISCELLANEOUS) ×2 IMPLANT
DEVICE DSSCT PLSMBLD 3.0S LGHT (MISCELLANEOUS) ×1 IMPLANT
PACEMAKER ACCOLADE DR-EL (Pacemaker) ×2 IMPLANT
PAD ELECT DEFIB RADIOL ZOLL (MISCELLANEOUS) ×2 IMPLANT
PLASMABLADE 3.0S W/LIGHT (MISCELLANEOUS) ×2
SUT VIC AB 3-0 X1 27 (SUTURE) ×2 IMPLANT
TRAY PACEMAKER INSERTION (PACKS) ×2 IMPLANT

## 2021-02-02 NOTE — Discharge Instructions (Signed)
Pacemaker Battery Change, Care After This sheet gives you information about how to care for yourself after your procedure. Your health care provider may also give you more specific instructions. If you have problems or questions, contact your health care provider. What can I expect after the procedure? After the procedure, it is common to have these symptoms at the site where the pacemaker was inserted:  Mild pain or soreness.  Slight bruising.  Some swelling over the incisions.  A slight bump over the skin where the device was placed (if it was implanted in the upper chest area). Sometimes, it is possible to feel the device under the skin. This is normal. Follow these instructions at home: Incision care  Keep the incision clean and dry for 2-3 days after the procedure or as told by your health care provider. It takes several weeks for the incision site to completely heal.  Do not remove the bandage (dressing) on your chest until told to do so by your health care provider.  Leave stitches (sutures), skin glue, or adhesive strips in place. These skin closures may need to stay in place for 2 weeks or longer. If adhesive strip edges start to loosen and curl up, you may trim the loose edges. Do not remove adhesive strips completely unless your health care provider tells you to do that.  Do not take baths, swim, or use a hot tub for 7-10 days or until your health care provider approves. Ask your health care provider if you may take showers. You may only be allowed to take sponge baths.  Pat the incision area dry with a clean towel. Do not rub the area. This may cause bleeding.  Check your incision area every day for signs of infection. Check for: ? More redness, swelling, or pain. ? Fluid or blood. ? Warmth. ? Pus or a bad smell.  Avoid putting pressure on the area where the pacemaker was placed. Women may want to place a small pad over the incision site to protect it from their bra strap.    Medicines  Take over-the-counter and prescription medicines only as told by your health care provider.  If you were prescribed an antibiotic medicine, take it as told by your health care provider. Do not stop taking the antibiotic even if you start to feel better. Activity  For the first 2 weeks, or as long as told by your health care provider: ? Avoid lifting your left arm higher than your shoulder. ? Be gentle when you move your arms over your head. It is okay to raise your arm to comb your hair. ? Avoid exercise or activities that take a lot of effort.  Ask your health care provider when it is okay to: ? Return to your normal activities. ? Return to work or school. ? Resume sexual activity.  If you were given a medicine to help you relax (sedative) during the procedure, it can affect you for several hours. Do not drive or operate machinery until your health care provider says that it is safe. General instructions  Do not use any products that contain nicotine or tobacco, such as cigarettes, e-cigarettes, and chewing tobacco. These can delay incision healing after surgery. If you need help quitting, ask your health care provider.  Always let all health care providers, including dentists, know about your pacemaker before you have any medical procedures or tests.  You may be shown how to transfer data from your pacemaker through the phone to your  health care provider.  Wear a medical ID bracelet or necklace stating that you have a pacemaker, and carry a pacemaker ID card with you at all times.  Avoid close and prolonged exposure to electrical devices that have strong magnetic fields. These include: ? [employer]. When at the airport, let officials know that you have a pacemaker. Carry your pacemaker ID card. ? Metal detectors. If you must pass through a metal detector, walk through it quickly. Do not stop under the detector or stand near it.  When using your mobile  phone, hold it to the ear opposite the pacemaker. Do not leave your mobile phone in a pocket over the pacemaker.  Your pacemaker battery will last for 5-15 years. Your health care provider will do routine checks to know when the battery is starting to run down. When this happens, the pacemaker will need to be replaced.  Keep all follow-up visits as told by your health care provider. This is important. Contact a health care provider if:  You have pain at the incision site that is not relieved by medicines.  You have any of these signs of infection: ? More redness, swelling, or pain around your incision. ? Fluid or blood coming from your incision. ? Warmth coming from your incision. ? Pus or a bad smell coming from your incision. ? A fever.  You feel brief, occasional palpitations, light-headedness, or any symptoms that you think might be related to your heart. Get help right away if:  You have chest pain that is different from the pain at the pacemaker site.  You develop a red streak that extends above or below the incision site.  You have shortness of breath.  You have palpitations or an irregular heartbeat.  You have light-headedness that does not go away quickly.  You faint or have dizzy spells.  Your pulse suddenly drops or increases rapidly and does not return to normal.  You gain weight and your legs and ankles swell. Summary  After the procedure, it is common to have pain, soreness, and some swelling or bruising where the pacemaker was inserted.  Keep your incision clean and dry. Follow instructions from your health care provider about how to take care of your incision.  Check your incision every day for signs of infection, such as more pain or swelling, pus or a bad smell, warmth, or leaking fluid or blood.  Carry a pacemaker ID card with you at all times. This information is not intended to replace advice given to you by your health care provider. Make sure you  discuss any questions you have with your health care provider. Document Revised: 10/17/2019 Document Reviewed: 10/17/2019 Elsevier Patient Education  [place]. May remove outer bandage on 02/03/2021.  Leave Steri-Strips on.  May shower on 02/03/2021.

## 2021-02-04 ENCOUNTER — Other Ambulatory Visit: Payer: Self-pay | Admitting: Family Medicine

## 2021-02-04 NOTE — Telephone Encounter (Signed)
Requested Prescriptions  Pending Prescriptions Disp Refills  . BREO ELLIPTA 100-25 MCG/INH AEPB [Pharmacy Med Name: Breo Ellipta 100-25 MCG/INH Inhalation Aerosol Powder Breath Activated] 180 each 0    Sig: USE 1 INHALATION BY MOUTH  ONCE DAILY AT THE SAME TIME EACH DAY     Pulmonology:  Combination Products Passed - 02/04/2021  4:20 AM      Passed - Valid encounter within last 12 months    Recent Outpatient Visits          1 month ago Pedal edema   [facility] Jerrol Banana., MD   2 months ago Essential (primary) hypertension   [place], [place], PA-C   3 months ago Essential (primary) hypertension   [place], PA-C   5 months ago Annual physical exam   [facility] Jerrol Banana., MD   9 months ago Essential (primary) hypertension   [facility] Jerrol Banana., MD      Future Appointments            In 4 months Ralene Bathe, MD [place]

## 2021-03-02 ENCOUNTER — Other Ambulatory Visit: Payer: Self-pay | Admitting: Family Medicine

## 2021-03-02 DIAGNOSIS — I10 Essential (primary) hypertension: Secondary | ICD-10-CM

## 2021-03-10 ENCOUNTER — Other Ambulatory Visit: Payer: Self-pay | Admitting: Family Medicine

## 2021-03-10 NOTE — Telephone Encounter (Signed)
Requested Prescriptions  Pending Prescriptions Disp Refills  . gabapentin (NEURONTIN) 100 MG capsule [Pharmacy Med Name: Gabapentin 100 MG Oral Capsule] 540 capsule 3    Sig: TAKE 2 CAPSULES BY MOUTH 3  TIMES DAILY     Neurology: Anticonvulsants - gabapentin Passed - 03/10/2021  9:02 PM      Passed - Valid encounter within last 12 months    Recent Outpatient Visits          2 months ago Pedal edema   [facility] Jerrol Banana., MD   3 months ago Essential (primary) hypertension   [place], [place], PA-C   4 months ago Essential (primary) hypertension   [facility], Vickki Muff, PA-C   7 months ago Annual physical exam   [facility] Jerrol Banana., MD   10 months ago Essential (primary) hypertension   [facility] Jerrol Banana., MD      Future Appointments            In 2 months Tyler Pita, MD [facility] [place]   In 3 months Ralene Bathe, MD [place]

## 2021-03-11 ENCOUNTER — Ambulatory Visit (INDEPENDENT_AMBULATORY_CARE_PROVIDER_SITE_OTHER): Payer: Medicare Other

## 2021-03-11 DIAGNOSIS — I10 Essential (primary) hypertension: Secondary | ICD-10-CM | POA: Diagnosis not present

## 2021-03-11 DIAGNOSIS — E7800 Pure hypercholesterolemia, unspecified: Secondary | ICD-10-CM

## 2021-03-11 DIAGNOSIS — J449 Chronic obstructive pulmonary disease, unspecified: Secondary | ICD-10-CM

## 2021-03-11 DIAGNOSIS — M810 Age-related osteoporosis without current pathological fracture: Secondary | ICD-10-CM

## 2021-03-11 NOTE — Chronic Care Management (AMB) (Signed)
Chronic Care Management   Follow Up Note   03/11/2021 Name: Crystal White MRN: [id number] DOB: 09/08/1941  Primary Care Provider: Jerrol Banana., MD Reason for referral : Chronic Care Management   Crystal White is a 80 y.o. year old female who is a primary care patient of Jerrol Banana., MD. She is currently enrolled in the chronic care management program.  Review of Crystal White's status, including review of consultants reports, relevant labs and test results was conducted today. Collaboration with appropriate care team members was performed as part of the comprehensive evaluation and provision of chronic care management services.    SDOH (Social Determinants of Health) assessments performed: No    Outpatient Encounter Medications as of 03/11/2021  Medication Sig Note  . acetaminophen (TYLENOL) 500 MG tablet Take 1,000 mg by mouth every 6 (six) hours as needed (pain/headaches.).    Marland Kitchen albuterol (PROVENTIL HFA) 108 (90 Base) MCG/ACT inhaler Inhale 2 puffs into the lungs every 6 (six) hours as needed for wheezing or shortness of breath.   Marland Kitchen BREO ELLIPTA 100-25 MCG/INH AEPB USE 1 INHALATION BY MOUTH  ONCE DAILY AT THE SAME TIME EACH DAY   . cephALEXin (KEFLEX) 250 MG capsule Take 2 capsules (500 mg total) by mouth 2 (two) times daily. (Patient not taking: Reported on 03/11/2021) 03/11/2021: Complete  . citalopram (CELEXA) 10 MG tablet Take 10 mg by mouth daily.   Marland Kitchen ezetimibe (ZETIA) 10 MG tablet TAKE 1 TABLET BY MOUTH  DAILY (Patient taking differently: Take 10 mg by mouth daily.)   . gabapentin (NEURONTIN) 100 MG capsule TAKE 2 CAPSULES BY MOUTH 3  TIMES DAILY   . hydrochlorothiazide (HYDRODIURIL) 25 MG tablet TAKE 1 TABLET BY MOUTH  DAILY (Patient taking differently: Take 25 mg by mouth daily.)   . losartan (COZAAR) 100 MG tablet TAKE 1 TABLET BY MOUTH  DAILY   . meclizine (ANTIVERT) 25 MG tablet Take 1 tablet (25 mg total) by mouth 2 (two) times daily as needed for  dizziness. (Patient not taking: Reported on 01/26/2021) 01/24/2017: As needed  . mirtazapine (REMERON) 30 MG tablet TAKE 1 TABLET BY MOUTH AT  BEDTIME (Patient taking differently: Take 30 mg by mouth at bedtime.)   . OXYGEN Inhale into the lungs daily. Uses at night with CPAP and PRN during daytime   . pantoprazole (PROTONIX) 40 MG tablet TAKE 1 TABLET BY MOUTH  TWICE DAILY (Patient taking differently: Take 40 mg by mouth 2 (two) times daily before a meal.)   . Tiotropium Bromide Monohydrate (SPIRIVA RESPIMAT) 2.5 MCG/ACT AERS Inhale 2 puffs into the lungs daily for 1 day. (Patient not taking: Reported on 11/23/2020)   . traZODone (DESYREL) 150 MG tablet TAKE 1 TABLET BY MOUTH AT  BEDTIME (Patient taking differently: Take 150 mg by mouth at bedtime.)    No facility-administered encounter medications on file as of 03/11/2021.     Objective:  Patient Care Plan: COPD (Adult)    Problem Identified: Symptom Exacerbation (COPD)     Long-Range Goal: Symptom Exacerbation Prevented or Minimized   Start Date: 01/12/2021  Expected End Date: 05/12/2021  Priority: High  Note:   Current Barriers:  . Chronic Disease Management needs and support r/t COPD self-management  Case Manager Clinical Goal(s): Marland Kitchen Over the next 120 days, patient will not require hospitalization or emergent care d/t complications r/t COPD exacerbation.   Interventions:  . Collaboration with Jerrol Banana., MD regarding development and update of comprehensive  plan of care as evidenced by provider attestation and co-signature . Inter-disciplinary care team collaboration (see longitudinal plan of care) . Reviewed medications and compliance with treatment plan. Reports using inhalers as prescribed. She has a productive cough. Describes sputum as "clear." Denies shortness of breath. She does express concerns regarding the effectiveness of her Breo inhaler. She plans to discuss options for other inhalers during her Pulmonology visit in  June.  . Discussed activity tolerance and reviewed safety measures. Encouraged to continue mild low impact exercises as tolerated and avoid overexertion. . Reviewed risks for complications r/t continued tobacco. . Advised to continue assessing symptoms r/t COPD daily. Advised to notify a provider for evaluation if she remains in the yellow zone and  symptoms are not improving. . Reviewed worsening/unrelieved s/sx that require immediate medical attention.   Patient Self Care Activities:  -Self administer medications and utilize inhalers as prescribed -Monitor symptoms and follow recommended COPD Action plan -Consider plan for smoking cessation -Avoid known COPD "triggers" and activities that may cause overexertion. -Contact provider or care management team with new questions  and concerns   Follow Up Plan: Will follow-up in two months     Patient Care Plan: Hypertension (Adult)    Problem Identified: Hypertension (Hypertension)     Long-Range Goal: Hypertension Monitored   Start Date: 01/12/2021  Expected End Date: 05/12/2021  Priority: High  Note:   Objective:  BP Readings from Last 3 Encounters:  02/02/21 (!) 157/58  11/23/20 (!) 158/60  11/12/20 (!) 191/72    Current Barriers:  . Chronic Disease Management needs and support r/t Hypertension self management.  Case Manager Clinical Goal(s):  Marland Kitchen Over the next 120 days, patient will demonstrate improved adherence to prescribed treatment plan for hypertension management as evidenced by taking all medications as prescribed, monitoring and recording blood pressure and adhering to a heart healthy/cardiac prudent diet.  Interventions:  . Collaboration with Jerrol Banana., MD regarding development and update of comprehensive plan of care as evidenced by provider attestation and co-signature . Inter-disciplinary care team collaboration (see longitudinal plan of care) . Discussed compliance with current treatment plan. Reports  taking medications as prescribed and monitoring her BP as recommended. Reports home systolic readings have ranged in the 130's. Diastolic readings have ranged in the 70's. Also notes significant improvements with maintaining a heart healthy diet. . Reviewed indications for notifying a provider for BP readings outside of the established range.  Patient Goals/Self-Care Activities - Self administer medications as prescribed - Consistently monitor BP and record readings. - Attend scheduled provider appointments and clinic BP checks when indicated - Follow recommended heart healthy/cardiac prudent diet - Contact provider or care management team with new questions and concerns as needed   Follow Up Plan:  Will follow up in two months   Patient Care Plan: Fall Risk    Problem Identified: High Risk for Falls     Long-Range Goal: Fall and  Injury Prevention   Start Date: 01/12/2021  Expected End Date: 05/12/2021  Priority: High  Note:   Current Barriers:  . High Risk for fall related injuries d/t Osteoporosis.  Clinical Goal(s):  Marland Kitchen Over the next 120 days, patient will not experience accidental falls or require emergent care d/t fall related injuries.  Interventions:  . Collaboration with Jerrol Banana., MD regarding development and update of comprehensive plan of care as evidenced by provider attestation and co-signature . Inter-disciplinary care team collaboration (see longitudinal plan of care) . Reviewed information  regarding safety and fall prevention measures. Denies recent falls. Encouraged to continue using an assistive device as needed. Advised to avoid attempting to lift/carry weighted objects and request assistance when needed. Confirmed that family members are still readily available to assist as needed.   Patient Goals/Self-Care Activities -Utilize assistive device appropriately with all ambulation -Ensure pathways are clear and well lit -Change positions slowly and  demonstrate self awareness at all times -Wear secure non skid footwear when ambulating -Contact provider or the care management team with questions and concerns as needed   Follow Up Plan:  Will follow up in two months       PLAN A member of the care management team will follow up with Crystal White in two months.    [name] [facility] Care Management [facility] [phone]

## 2021-03-12 NOTE — Patient Instructions (Signed)
Thank you for allowing the Chronic Care Management team to participate in your care. It was a pleasure speaking with you. Please don't hesitate to contact me with questions   Goals Addressed: Patient Care Plan: COPD (Adult)    Problem Identified: Symptom Exacerbation (COPD)     Long-Range Goal: Symptom Exacerbation Prevented or Minimized   Start Date: 01/12/2021  Expected End Date: 05/12/2021  Priority: High  Note:   Current Barriers:  . Chronic Disease Management needs and support r/t COPD self-management  Case Manager Clinical Goal(s): Marland Kitchen Over the next 120 days, patient will not require hospitalization or emergent care d/t complications r/t COPD exacerbation.   Interventions:  . Collaboration with Jerrol Banana., MD regarding development and update of comprehensive plan of care as evidenced by provider attestation and co-signature . Inter-disciplinary care team collaboration (see longitudinal plan of care) . Reviewed medications and compliance with treatment plan. Reports using inhalers as prescribed. She has a productive cough. Describes sputum as "clear." Denies shortness of breath. She does express concerns regarding the effectiveness of her Breo inhaler. She plans to discuss options for other inhalers during her Pulmonology visit in June.  . Discussed activity tolerance and reviewed safety measures. Encouraged to continue mild low impact exercises as tolerated and avoid overexertion. . Reviewed risks for complications r/t continued tobacco. . Advised to continue assessing symptoms r/t COPD daily. Advised to notify a provider for evaluation if she remains in the yellow zone and  symptoms are not improving. . Reviewed worsening/unrelieved s/sx that require immediate medical attention.   Patient Self Care Activities:  -Self administer medications and utilize inhalers as prescribed -Monitor symptoms and follow recommended COPD Action plan -Consider plan for smoking  cessation -Avoid known COPD "triggers" and activities that may cause overexertion. -Contact provider or care management team with new questions  and concerns   Follow Up Plan: Will follow-up in two months     Patient Care Plan: Hypertension (Adult)    Problem Identified: Hypertension (Hypertension)     Long-Range Goal: Hypertension Monitored   Start Date: 01/12/2021  Expected End Date: 05/12/2021  Priority: High  Note:   Objective:  BP Readings from Last 3 Encounters:  02/02/21 (!) 157/58  11/23/20 (!) 158/60  11/12/20 (!) 191/72    Current Barriers:  . Chronic Disease Management needs and support r/t Hypertension self management.  Case Manager Clinical Goal(s):  Marland Kitchen Over the next 120 days, patient will demonstrate improved adherence to prescribed treatment plan for hypertension management as evidenced by taking all medications as prescribed, monitoring and recording blood pressure and adhering to a heart healthy/cardiac prudent diet.  Interventions:  . Collaboration with Jerrol Banana., MD regarding development and update of comprehensive plan of care as evidenced by provider attestation and co-signature . Inter-disciplinary care team collaboration (see longitudinal plan of care) . Discussed compliance with current treatment plan. Reports taking medications as prescribed and monitoring her BP as recommended. Reports home systolic readings have ranged in the 130's. Diastolic readings have ranged in the 70's. Also notes significant improvements with maintaining a heart healthy diet. . Reviewed indications for notifying a provider for BP readings outside of the established range.  Patient Goals/Self-Care Activities - Self administer medications as prescribed - Consistently monitor BP and record readings. - Attend scheduled provider appointments and clinic BP checks when indicated - Follow recommended heart healthy/cardiac prudent diet - Contact provider or care management  team with new questions and concerns as needed   Follow  Up Plan:  Will follow up in two months   Patient Care Plan: Fall Risk    Problem Identified: High Risk for Falls     Long-Range Goal: Fall and  Injury Prevention   Start Date: 01/12/2021  Expected End Date: 05/12/2021  Priority: High  Note:   Current Barriers:  . High Risk for fall related injuries d/t Osteoporosis.  Clinical Goal(s):  Marland Kitchen Over the next 120 days, patient will not experience accidental falls or require emergent care d/t fall related injuries.  Interventions:  . Collaboration with Jerrol Banana., MD regarding development and update of comprehensive plan of care as evidenced by provider attestation and co-signature . Inter-disciplinary care team collaboration (see longitudinal plan of care) . Reviewed information regarding safety and fall prevention measures. Denies recent falls. Encouraged to continue using an assistive device as needed. Advised to avoid attempting to lift/carry weighted objects and request assistance when needed. Confirmed that family members are still readily available to assist as needed.   Patient Goals/Self-Care Activities -Utilize assistive device appropriately with all ambulation -Ensure pathways are clear and well lit -Change positions slowly and demonstrate self awareness at all times -Wear secure non skid footwear when ambulating -Contact provider or the care management team with questions and concerns as needed   Follow Up Plan:  Will follow up in two months       Mrs. Shelvin verbalized understanding of the information discussed during the telephonic outreach today. Declined need for mailed/printed instructions. A member of the care management team will follow up with Mrs. Dearmas in two months.    [name] Care Management [facility] [phone]

## 2021-04-02 ENCOUNTER — Encounter: Payer: Self-pay | Admitting: Pulmonary Disease

## 2021-04-02 ENCOUNTER — Other Ambulatory Visit: Payer: Self-pay

## 2021-04-02 ENCOUNTER — Ambulatory Visit: Payer: Medicare Other | Admitting: Pulmonary Disease

## 2021-04-02 VITALS — BP 170/84 | HR 78 | Temp 97.5°F | Ht 61.2 in | Wt 152.4 lb

## 2021-04-02 DIAGNOSIS — J9611 Chronic respiratory failure with hypoxia: Secondary | ICD-10-CM

## 2021-04-02 DIAGNOSIS — G4733 Obstructive sleep apnea (adult) (pediatric): Secondary | ICD-10-CM

## 2021-04-02 DIAGNOSIS — F17210 Nicotine dependence, cigarettes, uncomplicated: Secondary | ICD-10-CM

## 2021-04-02 DIAGNOSIS — R0602 Shortness of breath: Secondary | ICD-10-CM | POA: Diagnosis not present

## 2021-04-02 DIAGNOSIS — J449 Chronic obstructive pulmonary disease, unspecified: Secondary | ICD-10-CM | POA: Diagnosis not present

## 2021-04-02 MED ORDER — BREZTRI AEROSPHERE 160-9-4.8 MCG/ACT IN AERO: 160.0000 ug | INHALATION_SPRAY | Freq: Every day | RESPIRATORY_TRACT | 0 refills | Status: DC

## 2021-04-02 NOTE — Progress Notes (Addendum)
Subjective:    Patient ID: Crystal White, female    DOB: [birth year], 80 y.o.   MRN: [id number]  HPI Crystal White is a 80 year old current smoker (1 PPD) who follows here for the issue of COPD and dyspnea.  This is a scheduled appointment.  Recall we last saw her on 18 August 2020 at that time it had been determined that she needed triple therapy with LABA/LAMA/ICS however, she had just gotten a shipment of Breo Ellipta and wanted to utilize that before switching.  Provided her Spiriva at that time to add to her Memory Dance, she was supposed to let us know how that worked out for her and she never called back.  As noted she continues to smoke 1 pack of cigarettes per day.  Over the last month she has noted worsening of her dyspnea to the point that activities of daily living and the slightest exertion causes her severe dyspnea.  She has had cough that is quite congested but not productive of purulent sputum.  No hemoptysis.  She is enrolled in lung cancer screening program and her last imaging was performed in December 2021 with no notable suspicious lesions advanced emphysema.  She has not had pulmonary function testing since 2013 this showed an FEV1 of 1.28 L or 65% predicted and FEV1 of FVC of 46%.  This was in the moderate to severe range of obstruction.  The patient had significant bronchodilator response of 15% on her FEV1.  She has a pacemaker in place, she follows with [facility] cardiology.  She has not had an echo recently.   Review of Systems  A 10 point review of systems was performed and it is as noted above otherwise negative.  Patient Active Problem List   Diagnosis Date Noted  . Incisional hernia, without obstruction or gangrene 06/03/2016  . Osteoporosis 02/22/2016  . Musculoskeletal chest pain 07/14/2015  . Chronic respiratory failure ([place]) 07/14/2015  . Acute bronchitis 07/14/2015  . Other emphysema ([place]) 07/14/2015  . Chronic renal insufficiency 07/14/2015  . Chest pain 07/13/2015  .  Multiple pulmonary nodules 06/02/2015  . Colon polyp 05/29/2015  . Hemoptysis 04/29/2015  . Sprain of ankle 04/14/2015  . Anxiety 04/14/2015  . Barrett's esophagus 04/14/2015  . Acute exacerbation of chronic obstructive airways disease ([place]) 04/14/2015  . Bursitis 04/14/2015  . Cellulitis 04/14/2015  . Claudication ([place]) 04/14/2015  . Cough 04/14/2015  . Deficiency, 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  . Clinical depression 04/14/2015  . Dizziness 04/14/2015  . Dyslipidemia 04/14/2015  . Essential (primary) hypertension 04/14/2015  . Flank pain 04/14/2015  . H/O suicide attempt 04/14/2015  . Dysphonia 04/14/2015  . Hypercholesteremia 04/14/2015  . Cannot sleep 04/14/2015  . Malaise and fatigue 04/14/2015  . Mild cognitive impairment 04/14/2015  . Cramp in muscle 04/14/2015  . Muscle ache 04/14/2015  . Neuropathy 04/14/2015  . Adiposity 04/14/2015  . 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 Candida infection of mouth 04/14/2015  . Abnormal loss of weight 04/14/2015  . Decreased body weight 04/14/2015  . Artificial cardiac pacemaker 10/08/2014  . Apnea, sleep 10/08/2014  . COPD (chronic obstructive pulmonary disease) ([place]) 02/13/2012  . Tobacco abuse 02/13/2012  . Sinus congestion 02/13/2012  . Current tobacco use 02/13/2012  . Esophagitis, reflux 08/28/2006  . Abdominal pain, right lower quadrant 08/22/2005  . Acute appendicitis with peritoneal abscess 08/22/2005   Social History   Tobacco Use  . Smoking status: Current Every Day Smoker    Packs/day: 1.00    Years: 55.00  Pack years: 55.00    Types: Cigarettes  . Smokeless tobacco: Never Used  . Tobacco comment: Currently smoking a pack/day  Substance Use Topics  . Alcohol use: Yes    Alcohol/week: 0.0 standard drinks    Comment: 1/2 glass wine -rare   Allergies  Allergen Reactions  . Iodinated Diagnostic Agents Other (See Comments)    Burning sensation during contrast media injections. Patient states the IV  contrast makes her very hot. She has had multiple CT Scans with IV contrast and states she only gets the warm sensation. This is not an allergic reaction, but is noted in her [place] chart as well. Aggie Hacker, ARRT R CT, had this discussion with her on 01/10/19 at 1130am.  . Bacitracin Swelling and Rash  . Statins Other (See Comments) and Rash    Reaction:  Leg cramps    Current Meds  Medication Sig  . acetaminophen (TYLENOL) 500 MG tablet Take 1,000 mg by mouth every 6 (six) hours as needed (pain/headaches.).   Marland Kitchen albuterol (PROVENTIL HFA) 108 (90 Base) MCG/ACT inhaler Inhale 2 puffs into the lungs every 6 (six) hours as needed for wheezing or shortness of breath.  Marland Kitchen BREO ELLIPTA 100-25 MCG/INH AEPB USE 1 INHALATION BY MOUTH  ONCE DAILY AT THE SAME TIME EACH DAY  . citalopram (CELEXA) 10 MG tablet Take 10 mg by mouth daily.  Marland Kitchen ezetimibe (ZETIA) 10 MG tablet TAKE 1 TABLET BY MOUTH  DAILY (Patient taking differently: Take 10 mg by mouth daily.)  . gabapentin (NEURONTIN) 100 MG capsule TAKE 2 CAPSULES BY MOUTH 3  TIMES DAILY  . hydrochlorothiazide (HYDRODIURIL) 25 MG tablet TAKE 1 TABLET BY MOUTH  DAILY (Patient taking differently: Take 25 mg by mouth daily.)  . losartan (COZAAR) 100 MG tablet TAKE 1 TABLET BY MOUTH  DAILY  . meclizine (ANTIVERT) 25 MG tablet Take 1 tablet (25 mg total) by mouth 2 (two) times daily as needed for dizziness.  . mirtazapine (REMERON) 30 MG tablet TAKE 1 TABLET BY MOUTH AT  BEDTIME (Patient taking differently: Take 30 mg by mouth at bedtime.)  . OXYGEN Inhale into the lungs daily. Uses at night with CPAP and PRN during daytime  . pantoprazole (PROTONIX) 40 MG tablet TAKE 1 TABLET BY MOUTH  TWICE DAILY (Patient taking differently: Take 40 mg by mouth 2 (two) times daily before a meal.)  . traZODone (DESYREL) 150 MG tablet TAKE 1 TABLET BY MOUTH AT  BEDTIME (Patient taking differently: Take 150 mg by mouth at bedtime.)     Immunization History  Administered  Date(s) Administered  . Fluad Quad(high Dose 65+) 08/11/2020  . Influenza Split 09/20/2006, 09/02/2010, 09/10/2012  . Influenza Whole 08/28/2012  . Influenza, High Dose Seasonal PF 08/28/2014, 08/10/2015, 07/25/2016, 10/03/2017, 08/30/2018, 09/18/2019  . Influenza-Unspecified 08/28/2013  . Moderna SARS-COV2 Booster Vaccination 10/12/2020  . Moderna Sars-Covid-2 Vaccination 01/09/2020, 02/06/2020  . Pneumococcal Conjugate-13 08/28/2014  . Pneumococcal Polysaccharide-23 03/07/2011  . Tdap 08/28/2014  . Zoster 11/04/2012       Objective:   Physical Exam BP (!) 170/84 (BP Location: Left Arm, Patient Position: Sitting, Cuff Size: Normal)   Pulse 78   Temp (!) 97.5 F (36.4 C) (Temporal)   Ht 5' 1.2" (1.554 m)   Wt 152 lb 6.4 oz (69.1 kg)   SpO2 91%   BMI 28.61 kg/m   GENERAL: Well-developed patient elderly woman in no acute distress.  Fully ambulatory, mild to moderate use of accessories of respiration HEAD: Normocephalic, atraumatic.  EYES:  Pupils equal, round, reactive to light.  No scleral icterus.  MOUTH: Nose/mouth/throat not examined due to masking requirements for COVID 19. NECK: Supple. No thyromegaly. Trachea midline. No JVD.  No adenopathy. PULMONARY: Distant breath sounds, coarse, no wheezes.   CARDIOVASCULAR: S1 and S2. Regular rate and rhythm.  No rubs, murmurs or gallops heard.  ABDOMEN: Benign. MUSCULOSKELETAL: No joint deformity, no clubbing, no edema.  NEUROLOGIC: No overt focal deficit.  Speech is fluent. SKIN: Intact,warm,dry.  Limited exam: No rashes. PSYCH: Mood and behavior normal.  Ambulatory oximetry was performed today: On room air patient desaturated to 85% with increased work of breathing.  She was placed on 2 L/min with poor recovery so was titrated to 3 L/min and was able to maintain at 3 L/min.    Assessment & Plan:     ICD-10-CM   1. Chronic obstructive pulmonary disease, unspecified COPD type ([place])  J44.9 Pulmonary Function Test [facility] Only   Will  need to reassess with pulmonary function testing Suspect mixed type chronic bronchitis plus emphysema Trial of Breztri 2 puffs twice a day Stop Breo  2. Shortness of breath  R06.02 ECHOCARDIOGRAM COMPLETE   Significant hypoxemia with exercise Poorly compensated COPD Cannot exclude cor pulmonale/pulmonary hypertension PFTs/2D echo  3. Chronic respiratory failure with hypoxia (H[facility])  J96.11 AMB REFERRAL FOR DME   Oxygen at 2 L/min with rest and sleep Oxygen at 3 L/min with exertion Order sent to adapt Patient qualified  4. Obstructive sleep apnea syndrome  G47.33    Continue CPAP with O2 bled in at bedtime  5. Tobacco dependence due to cigarettes  F17.210    Patient was counseled regards to discontinuation of smoking Telephone number for smoking cessation program    Orders Placed This Encounter  Procedures  . AMB REFERRAL FOR DME    Referral Priority:   Routine    Referral Type:   Durable Medical Equipment Purchase    Number of Visits Requested:   1  . Pulmonary Function Test [facility] Only    Standing Status:   Future    Standing Expiration Date:   04/02/2022    Scheduling Instructions:     NEXT AVAILABLE    Order Specific Question:   Full PFT: includes the following: basic spirometry, spirometry pre & post bronchodilator, diffusion capacity (DLCO), lung volumes    Answer:   Full PFT    Order Specific Question:   This test can only be performed at    Answer:   [facility]  . ECHOCARDIOGRAM COMPLETE    Standing Status:   Future    Standing Expiration Date:   04/02/2022    Order Specific Question:   Where should this test be performed    Answer:   [facility]    Order Specific Question:   Perflutren DEFINITY (image enhancing agent) should be administered unless hypersensitivity or allergy exist    Answer:   Administer Perflutren    Order Specific Question:   Reason for exam-Echo    Answer:   Dyspnea  R06.00   Meds ordered this encounter  Medications  .  Budeson-Glycopyrrol-Formoterol (BREZTRI AEROSPHERE) 160-9-4.8 MCG/ACT AERO    Sig: Inhale 160 mcg into the lungs daily.    Dispense:  5.9 g    Refill:  0    Order Specific Question:   Lot Number?    Answer:   [id number]    Order Specific Question:   Expiration Date?    Answer:   08/27/2022    Order  Specific Question:   [place]    [id number]      Patient qualified for oxygen today.  She was advised that she needs to discontinue smoking.  I suspect that she has now progressed to COPD GOLD class IV/D.  Possibility of Cor pulmonale/pulmonary hypertension also likely.  Testing as above.  Referral for oxygen has been sent in.  She was provided with samples of Breztri 2 puffs twice a day as well as a spacer for medication delivery.  She was taught the proper use of the inhaler and spacer device.  We will see her in follow-up in 4 to 6 weeks time with either me or the nurse practitioner.  Renold Don, MD [place] [facility]   *This note was dictated using voice recognition software/Dragon.  Despite best efforts to proofread, errors can occur which can change the meaning.  Any change was purely unintentional.

## 2021-04-02 NOTE — Patient Instructions (Signed)
We are giving you a trial of an inhaler called Breztri, this will be 2 puffs twice a day.  We are giving you a spacer that will help get the medication deeper into your lungs.  DO NOT USE BREO WHILE TAKING THE BREZTRI  Please let us know how you are doing with the Judithann Sauger so that we can call the medication in for you.  If you are having difficulties with obtaining the medication to call so we can give you more samples until we can get you enrolled in medication assistance.  Please enroll in the smoking cessation program for [facility], call [phone] and ask for the smoking cessation program.  It is imperative that you quit smoking.  We are repeating breathing tests and we are also getting a heart test to evaluate the pressure of the artery going from the heart to the lungs.  We will see her in follow-up in 4 to 6 weeks time, you will either see me or the nurse practitioner.

## 2021-04-08 ENCOUNTER — Telehealth: Payer: Self-pay

## 2021-04-08 NOTE — Telephone Encounter (Signed)
Patient is aware of date/time of covid test and voiced her understanding.

## 2021-04-09 ENCOUNTER — Other Ambulatory Visit: Payer: Self-pay

## 2021-04-09 ENCOUNTER — Ambulatory Visit
Admission: RE | Admit: 2021-04-09 | Discharge: 2021-04-09 | Disposition: A | Discharge status: Home / Self Care | Payer: Medicare Other | Source: Ambulatory Visit | Attending: Pulmonary Disease | Admitting: Pulmonary Disease

## 2021-04-09 DIAGNOSIS — R0602 Shortness of breath: Secondary | ICD-10-CM | POA: Diagnosis present

## 2021-04-09 DIAGNOSIS — Z950 Presence of cardiac pacemaker: Secondary | ICD-10-CM | POA: Insufficient documentation

## 2021-04-09 DIAGNOSIS — J449 Chronic obstructive pulmonary disease, unspecified: Secondary | ICD-10-CM | POA: Diagnosis not present

## 2021-04-09 DIAGNOSIS — I10 Essential (primary) hypertension: Secondary | ICD-10-CM | POA: Diagnosis not present

## 2021-04-09 LAB — ECHOCARDIOGRAM COMPLETE
AR max vel: 2.11 cm2
AV Area VTI: 2.51 cm2
AV Area mean vel: 2.19 cm2
AV Mean grad: 3 mmHg
AV Peak grad: 6 mmHg
Ao pk vel: 1.22 m/s
Area-P 1/2: 2.85 cm2
S' Lateral: 2.5 cm

## 2021-04-09 NOTE — Progress Notes (Signed)
*  PRELIMINARY RESULTS* Echocardiogram 2D Echocardiogram has been performed.  Sherrie Sport 04/09/2021, 10:37 AM

## 2021-04-12 ENCOUNTER — Other Ambulatory Visit
Admission: RE | Admit: 2021-04-12 | Discharge: 2021-04-12 | Disposition: A | Discharge status: Home / Self Care | Payer: Medicare Other | Source: Ambulatory Visit | Attending: Pulmonary Disease | Admitting: Pulmonary Disease

## 2021-04-12 ENCOUNTER — Other Ambulatory Visit: Payer: Self-pay

## 2021-04-12 DIAGNOSIS — Z20822 Contact with and (suspected) exposure to covid-19: Secondary | ICD-10-CM | POA: Insufficient documentation

## 2021-04-12 DIAGNOSIS — Z01812 Encounter for preprocedural laboratory examination: Secondary | ICD-10-CM | POA: Insufficient documentation

## 2021-04-12 LAB — SARS CORONAVIRUS 2 (TAT 6-24 HRS): SARS Coronavirus 2: NEGATIVE

## 2021-04-12 MED ORDER — BREZTRI AEROSPHERE 160-9-4.8 MCG/ACT IN AERO: 160.0000 ug | INHALATION_SPRAY | Freq: Every day | RESPIRATORY_TRACT | 0 refills | Status: DC

## 2021-04-12 MED ORDER — BREZTRI AEROSPHERE 160-9-4.8 MCG/ACT IN AERO: 2.0000 | INHALATION_SPRAY | Freq: Two times a day (BID) | RESPIRATORY_TRACT | 3 refills | Status: DC

## 2021-04-13 ENCOUNTER — Ambulatory Visit: Payer: Medicare Other | Attending: Pulmonary Disease

## 2021-04-13 DIAGNOSIS — J449 Chronic obstructive pulmonary disease, unspecified: Secondary | ICD-10-CM

## 2021-04-13 MED ORDER — ALBUTEROL SULFATE (2.5 MG/3ML) 0.083% IN NEBU
2.5000 mg | INHALATION_SOLUTION | Freq: Once | RESPIRATORY_TRACT | Status: AC
  Administered 2021-04-13: 2.5 mg via RESPIRATORY_TRACT
  Filled 2021-04-13: qty 3

## 2021-04-20 ENCOUNTER — Other Ambulatory Visit: Payer: Self-pay | Admitting: Family Medicine

## 2021-04-20 NOTE — Telephone Encounter (Signed)
Requested Prescriptions  Pending Prescriptions Disp Refills  . traZODone (DESYREL) 150 MG tablet [Pharmacy Med Name: traZODone HCl 150 MG Oral Tablet] 90 tablet 0    Sig: TAKE 1 TABLET BY MOUTH AT  BEDTIME     Psychiatry: Antidepressants - Serotonin Modulator Passed - 04/20/2021  9:01 PM      Passed - Completed PHQ-2 or PHQ-9 in the last 360 days      Passed - Valid encounter within last 6 months    Recent Outpatient Visits          4 months ago Pedal edema   [facility] Jerrol Banana., MD   5 months ago Essential (primary) hypertension   [facility] [place], PA-C   5 months ago Essential (primary) hypertension   [facility], Vickki Muff, PA-C   8 months ago Annual physical exam   [facility] Jerrol Banana., MD   11 months ago Essential (primary) hypertension   [facility] Jerrol Banana., MD      Future Appointments            In 3 weeks Parrett, Fonnie Mu, NP [facility]   In 1 month Ralene Bathe, MD [place]           . hydrochlorothiazide (HYDRODIURIL) 25 MG tablet [Pharmacy Med Name: hydroCHLOROthiazide 25 MG Oral Tablet] 90 tablet 0    Sig: TAKE 1 TABLET BY MOUTH  DAILY     Cardiovascular: Diuretics - Thiazide Failed - 04/20/2021  9:01 PM      Failed - Cr in normal range and within 360 days    Creatinine  Date Value Ref Range Status  12/24/2012 1.36 (H) 0.60 - 1.30 mg/dL Final   Creatinine, Ser  Date Value Ref Range Status  11/11/2020 1.83 (H) 0.44 - 1.00 mg/dL Final         Failed - Last BP in normal range    BP Readings from Last 1 Encounters:  04/02/21 (!) 170/84         Passed - Ca in normal range and within 360 days    Calcium  Date Value Ref Range Status  11/11/2020 8.9 8.9 - 10.3 mg/dL Final   Calcium, Total  Date Value Ref Range Status  12/24/2012 9.6 8.5 - 10.1 mg/dL Final         Passed - K in  normal range and within 360 days    Potassium  Date Value Ref Range Status  11/11/2020 4.1 3.5 - 5.1 mmol/L Final  12/24/2012 3.9 3.5 - 5.1 mmol/L Final         Passed - Na in normal range and within 360 days    Sodium  Date Value Ref Range Status  11/11/2020 141 135 - 145 mmol/L Final  08/11/2020 142 134 - 144 mmol/L Final  12/24/2012 141 136 - 145 mmol/L Final         Passed - Valid encounter within last 6 months    Recent Outpatient Visits          4 months ago Pedal edema   [facility] Jerrol Banana., MD   5 months ago Essential (primary) hypertension   [place], [place], PA-C   5 months ago Essential (primary) hypertension   [employer] [facility], Vickki Muff, PA-C   8 months ago Annual physical exam   [facility],  Retia Passe., MD   11 months ago Essential (primary) hypertension   [facility] Jerrol Banana., MD      Future Appointments            In 3 weeks Parrett, Fonnie Mu, NP [facility] [place]   In 1 month Ralene Bathe, MD [place]

## 2021-04-23 ENCOUNTER — Other Ambulatory Visit: Payer: Self-pay | Admitting: Family Medicine

## 2021-04-23 DIAGNOSIS — E7800 Pure hypercholesterolemia, unspecified: Secondary | ICD-10-CM

## 2021-04-23 NOTE — Telephone Encounter (Signed)
   Notes to clinic Historical Provider  

## 2021-05-11 ENCOUNTER — Ambulatory Visit: Payer: Medicare Other | Admitting: Adult Health

## 2021-05-17 ENCOUNTER — Ambulatory Visit: Payer: Medicare Other | Admitting: Pulmonary Disease

## 2021-05-21 ENCOUNTER — Ambulatory Visit (INDEPENDENT_AMBULATORY_CARE_PROVIDER_SITE_OTHER): Payer: Medicare Other

## 2021-05-21 DIAGNOSIS — J449 Chronic obstructive pulmonary disease, unspecified: Secondary | ICD-10-CM

## 2021-05-21 DIAGNOSIS — I10 Essential (primary) hypertension: Secondary | ICD-10-CM

## 2021-05-21 NOTE — Chronic Care Management (AMB) (Signed)
Chronic Care Management   Follow Up Note   05/21/2021 Name: Crystal White MRN: [id number] DOB: [birth year]  Primary Care Provider: Jerrol Banana., MD Reason for referral : Chronic Care Management   Crystal White is a 80 y.o. year old female who is a primary care patient of Jerrol Banana., MD. She is currently enrolled in the Chronic Care Management program. A routine telephonic outreach was conducted today.  Review of Ms. Kizer's status, including review of consultants reports, relevant labs and test results was conducted today. Collaboration with appropriate care team members was performed as part of the comprehensive evaluation and provision of chronic care management services.     SDOH (Social Determinants of Health) assessments performed: No    Outpatient Encounter Medications as of 05/21/2021  Medication Sig Note   acetaminophen (TYLENOL) 500 MG tablet Take 1,000 mg by mouth every 6 (six) hours as needed (pain/headaches.).     albuterol (PROVENTIL HFA) 108 (90 Base) MCG/ACT inhaler Inhale 2 puffs into the lungs every 6 (six) hours as needed for wheezing or shortness of breath.    Budeson-Glycopyrrol-Formoterol (BREZTRI AEROSPHERE) 160-9-4.8 MCG/ACT AERO Inhale 160 mcg into the lungs daily.    Budeson-Glycopyrrol-Formoterol (BREZTRI AEROSPHERE) 160-9-4.8 MCG/ACT AERO Inhale 2 puffs into the lungs in the morning and at bedtime.    cephALEXin (KEFLEX) 250 MG capsule Take 2 capsules (500 mg total) by mouth 2 (two) times daily. (Patient not taking: No sig reported) 03/11/2021: Complete   citalopram (CELEXA) 10 MG tablet TAKE 1 TABLET BY MOUTH AT  BEDTIME    ezetimibe (ZETIA) 10 MG tablet TAKE 1 TABLET BY MOUTH  DAILY    gabapentin (NEURONTIN) 100 MG capsule TAKE 2 CAPSULES BY MOUTH 3  TIMES DAILY    hydrochlorothiazide (HYDRODIURIL) 25 MG tablet TAKE 1 TABLET BY MOUTH  DAILY    losartan (COZAAR) 100 MG tablet TAKE 1 TABLET BY MOUTH  DAILY    meclizine (ANTIVERT) 25 MG  tablet Take 1 tablet (25 mg total) by mouth 2 (two) times daily as needed for dizziness. 01/24/2017: As needed   mirtazapine (REMERON) 30 MG tablet TAKE 1 TABLET BY MOUTH AT  BEDTIME (Patient taking differently: Take 30 mg by mouth at bedtime.)    OXYGEN Inhale into the lungs daily. Uses at night with CPAP and PRN during daytime    pantoprazole (PROTONIX) 40 MG tablet TAKE 1 TABLET BY MOUTH  TWICE DAILY    traZODone (DESYREL) 150 MG tablet TAKE 1 TABLET BY MOUTH AT  BEDTIME    No facility-administered encounter medications on file as of 05/21/2021.     Objective:  Patient Care Plan: COPD (Adult)     Problem Identified: Symptom Exacerbation (COPD)      Long-Range Goal: Symptom Exacerbation Prevented or Minimized   Start Date: 05/21/2021  Expected End Date: 09/18/2021  Priority: High  Note:   Current Barriers:  Chronic Disease Management needs and support r/t COPD self-management  Case Manager Clinical Goal(s): Over the next 120 days, patient will not require hospitalization or emergent care d/t complications r/t COPD exacerbation.   Interventions:  Collaboration with Jerrol Banana., MD regarding development and update of comprehensive plan of care as evidenced by provider attestation and co-signature Inter-disciplinary care team collaboration (see longitudinal plan of care) Reviewed medications and compliance with treatment plan. Reports using inhalers as prescribed. Reports symptoms have been well controlled since starting the [facility] inhaler. She will keep the care management team updated of concerns  regarding cost. Continues to experience exertional dyspnea but reports doing very well overall. Denies change or decline in activity tolerance. Reports only requiring supplemental oxygen at night. Currently using 3L/min via her CPAP.  Reviewed risks for complications r/t tobacco use.  Reports currently smoking only 3 cigarettes/day. Continues to work towards her goal of  quitting. Advised to continue assessing symptoms r/t COPD daily. Advised to notify a provider for evaluation if she remains in the yellow zone and  symptoms are not improving. Reviewed worsening/unrelieved s/sx that require immediate medical attention.   Patient Self Care Activities:  -Self administer medications and utilize inhalers as prescribed -Monitor symptoms and follow recommended COPD Action plan -Consider plan for smoking cessation -Avoid known COPD "triggers" and activities that may cause overexertion. -Contact provider or care management team with new questions  and concerns   Follow Up Plan: Will follow-up in two months      Patient Care Plan: Hypertension (Adult)     Problem Identified: Hypertension (Hypertension)      Long-Range Goal: Hypertension Monitored   Start Date: 05/21/2021  Expected End Date: 09/18/2021  Priority: High  Note:   Objective:  Current Barriers:  Chronic Disease Management needs and support r/t Hypertension self management.  Case Manager Clinical Goal(s):  Over the next 120 days, patient will demonstrate improved adherence to prescribed treatment plan for hypertension management as evidenced by taking all medications as prescribed, monitoring and recording blood pressure and adhering to a heart healthy/cardiac prudent diet.  Interventions:  Collaboration with Jerrol Banana., MD regarding development and update of comprehensive plan of care as evidenced by provider attestation and co-signature Inter-disciplinary care team collaboration (see longitudinal plan of care) Discussed compliance with current treatment plan. Reviewed established BP parameters and indications for notifying a provider. Reports excellent compliance with medications and monitoring her BP as recommended. Reports home readings have been within range. Reports doing very well with maintaining a heart healthy/cardiac prudent diet. She has very good family support and  remains motivated to improve her overall health. Reviewed s/sx of heart attack, stroke and worsening symptoms that require immediate medical attention.  Patient Goals/Self-Care Activities - Self administer medications as prescribed - Consistently monitor BP and record readings. - Attend scheduled provider appointments and clinic BP checks when indicated - Follow recommended heart healthy/cardiac prudent diet - Contact provider or care management team with new questions and concerns as needed   Follow Up Plan:  Will follow up in two months    Patient Care Plan: Fall Risk     Problem Identified: High Risk for Falls      Long-Range Goal: Fall and  Injury Prevention Completed 05/21/2021  Start Date: 01/12/2021  Expected End Date: 05/12/2021  Priority: High  Note:   Current Barriers:  High Risk for fall related injuries d/t Osteoporosis.  Clinical Goal(s):  Over the next 120 days, patient will not experience accidental falls or require emergent care d/t fall related injuries.  Interventions:  Collaboration with Jerrol Banana., MD regarding development and update of comprehensive plan of care as evidenced by provider attestation and co-signature Inter-disciplinary care team collaboration (see longitudinal plan of care) Reviewed information regarding safety and fall prevention measures. Denies recent falls. Reports ambulating well over the past few weeks. Denies change or decline in activity tolerance. Reports using assistive devices as recommended and continues to use a wheelchair when away from the home for prolonged periods. Very good family support. Declines need for additional assistance.  Patient Goals/Self-Care Activities -Utilize assistive device appropriately with all ambulation -Ensure pathways are clear and well lit -Change positions slowly and demonstrate self awareness at all times -Wear secure non skid footwear when ambulating -Contact provider or the care  management team with questions and concerns as needed   Goal Met       PLAN A member of the care management team will follow up within the next two months.    [name] Care Management [facility] [phone]

## 2021-05-28 ENCOUNTER — Other Ambulatory Visit: Payer: Self-pay | Admitting: Family Medicine

## 2021-05-28 DIAGNOSIS — I10 Essential (primary) hypertension: Secondary | ICD-10-CM

## 2021-06-03 ENCOUNTER — Other Ambulatory Visit: Payer: Self-pay | Admitting: Family Medicine

## 2021-06-04 NOTE — Patient Instructions (Signed)
Thank you for allowing the Chronic Care Management team to participate in your care.  It was a pleasure speaking with you. Please feel free to contact me with questions.   Goals Addressed: Patient Care Plan: COPD (Adult)     Problem Identified: Symptom Exacerbation (COPD)      Long-Range Goal: Symptom Exacerbation Prevented or Minimized   Start Date: 05/21/2021  Expected End Date: 09/18/2021  Priority: High  Note:   Current Barriers:  Chronic Disease Management needs and support r/t COPD self-management  Case Manager Clinical Goal(s): Over the next 120 days, patient will not require hospitalization or emergent care d/t complications r/t COPD exacerbation.   Interventions:  Collaboration with Jerrol Banana., MD regarding development and update of comprehensive plan of care as evidenced by provider attestation and co-signature Inter-disciplinary care team collaboration (see longitudinal plan of care) Reviewed medications and compliance with treatment plan. Reports using inhalers as prescribed. Reports symptoms have been well controlled since starting the [facility] inhaler. She will keep the care management team updated of concerns regarding cost. Continues to experience exertional dyspnea but reports doing very well overall. Denies change or decline in activity tolerance. Reports only requiring supplemental oxygen at night. Currently using 3L/min via her CPAP.  Reviewed risks for complications r/t tobacco use.  Reports currently smoking only 3 cigarettes/day. Continues to work towards her goal of quitting. Advised to continue assessing symptoms r/t COPD daily. Advised to notify a provider for evaluation if she remains in the yellow zone and  symptoms are not improving. Reviewed worsening/unrelieved s/sx that require immediate medical attention.   Patient Self Care Activities:  -Self administer medications and utilize inhalers as prescribed -Monitor symptoms and follow recommended  COPD Action plan -Consider plan for smoking cessation -Avoid known COPD "triggers" and activities that may cause overexertion. -Contact provider or care management team with new questions  and concerns   Follow Up Plan: Will follow-up in two months      Patient Care Plan: Hypertension (Adult)     Problem Identified: Hypertension (Hypertension)      Long-Range Goal: Hypertension Monitored   Start Date: 05/21/2021  Expected End Date: 09/18/2021  Priority: High  Note:   Objective:  Current Barriers:  Chronic Disease Management needs and support r/t Hypertension self management.  Case Manager Clinical Goal(s):  Over the next 120 days, patient will demonstrate improved adherence to prescribed treatment plan for hypertension management as evidenced by taking all medications as prescribed, monitoring and recording blood pressure and adhering to a heart healthy/cardiac prudent diet.  Interventions:  Collaboration with Jerrol Banana., MD regarding development and update of comprehensive plan of care as evidenced by provider attestation and co-signature Inter-disciplinary care team collaboration (see longitudinal plan of care) Discussed compliance with current treatment plan. Reviewed established BP parameters and indications for notifying a provider. Reports excellent compliance with medications and monitoring her BP as recommended. Reports home readings have been within range. Reports doing very well with maintaining a heart healthy/cardiac prudent diet. She has very good family support and remains motivated to improve her overall health. Reviewed s/sx of heart attack, stroke and worsening symptoms that require immediate medical attention.  Patient Goals/Self-Care Activities - Self administer medications as prescribed - Consistently monitor BP and record readings. - Attend scheduled provider appointments and clinic BP checks when indicated - Follow recommended heart  healthy/cardiac prudent diet - Contact provider or care management team with new questions and concerns as needed   Follow Up Plan:  Will follow up in two months    Patient Care Plan: Fall Risk     Problem Identified: High Risk for Falls      Long-Range Goal: Fall and  Injury Prevention Completed 05/21/2021  Start Date: 01/12/2021  Expected End Date: 05/12/2021  Priority: High  Note:   Current Barriers:  High Risk for fall related injuries d/t Osteoporosis.  Clinical Goal(s):  Over the next 120 days, patient will not experience accidental falls or require emergent care d/t fall related injuries.  Interventions:  Collaboration with Jerrol Banana., MD regarding development and update of comprehensive plan of care as evidenced by provider attestation and co-signature Inter-disciplinary care team collaboration (see longitudinal plan of care) Reviewed information regarding safety and fall prevention measures. Denies recent falls. Reports ambulating well over the past few weeks. Denies change or decline in activity tolerance. Reports using assistive devices as recommended and continues to use a wheelchair when away from the home for prolonged periods. Very good family support. Declines need for additional assistance.   Patient Goals/Self-Care Activities -Utilize assistive device appropriately with all ambulation -Ensure pathways are clear and well lit -Change positions slowly and demonstrate self awareness at all times -Wear secure non skid footwear when ambulating -Contact provider or the care management team with questions and concerns as needed   Goal Met        Ms. Ardelean verbalized understanding of the information discussed during the telephonic outreach. Declined need for mailed/printed instructions. A member of the care management team will follow up within the next two months.    [name] Care [facility] [phone]

## 2021-06-08 ENCOUNTER — Ambulatory Visit (INDEPENDENT_AMBULATORY_CARE_PROVIDER_SITE_OTHER): Payer: Medicare Other

## 2021-06-08 DIAGNOSIS — J449 Chronic obstructive pulmonary disease, unspecified: Secondary | ICD-10-CM

## 2021-06-08 NOTE — Patient Instructions (Signed)
Thank you for allowing the Chronic Care Management team to participate in your care.    Patient Care Plan: COPD (Adult)     Problem Identified: Symptom Exacerbation (COPD)      Long-Range Goal: Symptom Exacerbation Prevented or Minimized   Start Date: 05/21/2021  Expected End Date: 09/18/2021  Priority: High    Current Barriers:  Chronic Disease Management needs and support r/t COPD self-management  Case Manager Clinical Goal(s): Over the next 120 days, patient will not require hospitalization or emergent care d/t complications r/t COPD exacerbation.   Interventions:  Collaboration with Jerrol Banana., MD regarding development and update of comprehensive plan of care as evidenced by provider attestation and co-signature Inter-disciplinary care team collaboration (see longitudinal plan of care) Reviewed medications and compliance with treatment plan. Reports using inhalers as prescribed. Reports symptoms have been well controlled since starting the [facility] inhaler. She will keep the care management team updated of concerns regarding cost. Continues to experience exertional dyspnea but reports doing very well overall. Denies change or decline in activity tolerance. Reports only requiring supplemental oxygen at night. Currently using 3L/min via her CPAP.  Reviewed risks for complications r/t tobacco use.  Reports currently smoking only 3 cigarettes/day. Continues to work towards her goal of quitting. Advised to continue assessing symptoms r/t COPD daily. Advised to notify a provider for evaluation if she remains in the yellow zone and  symptoms are not improving. Reviewed worsening/unrelieved s/sx that require immediate medical attention. Update 06/08/21- Referral submitted to CCM Pharmacist. Mrs. Zenia Resides requires assistance with [employer].   Patient Self Care Activities:  -Self administer medications and utilize inhalers as prescribed -Monitor symptoms and follow recommended COPD  Action plan -Consider plan for smoking cessation -Avoid known COPD "triggers" and activities that may cause overexertion. -Complete outreach with CCM Pharmacist to discuss options for medication assistance -Contact provider or care management team with new questions  and concerns   Follow Up Plan: Will follow-up in two months        Care Coordination :Referral submitted for medication assistance. Will complete next nurse care management outreach in August.    [place] [phone]

## 2021-06-08 NOTE — Chronic Care Management (AMB) (Signed)
Chronic Care Management   CCM RN Visit Note   Name: Crystal White MRN: [id number] DOB: December 13, 1940  Subjective: Crystal White is a 80 y.o. year old female who is a primary care patient of Jerrol Banana., MD. The care management team was consulted for assistance with disease management and care coordination needs.    Care Coordination  for follow up visit in response to provider referral for case management and/or care coordination services.   Consent to Services:  The patient was given information about Chronic Care Management services, agreed to services, and gave verbal consent prior to initiation of services.  Please see initial visit note for detailed documentation.   Assessment: Review of patient past medical history, allergies, medications, health status, including review of consultants reports, laboratory and other test data, was performed as part of comprehensive evaluation and provision of chronic care management services.   SDOH (Social Determinants of Health) assessments and interventions performed: No  CCM Care Plan  Allergies  Allergen Reactions   Iodinated Diagnostic Agents Other (See Comments)    Burning sensation during contrast media injections. Patient states the IV contrast makes her very hot. She has had multiple CT Scans with IV contrast and states she only gets the warm sensation. This is not an allergic reaction, but is noted in her [place] chart as well. Aggie Hacker, ARRT R CT, had this discussion with her on 01/10/19 at 1130am.   Bacitracin Swelling and Rash   Statins Other (See Comments) and Rash    Reaction:  Leg cramps     Outpatient Encounter Medications as of 06/08/2021  Medication Sig Note   acetaminophen (TYLENOL) 500 MG tablet Take 1,000 mg by mouth every 6 (six) hours as needed (pain/headaches.).     Budeson-Glycopyrrol-Formoterol (BREZTRI AEROSPHERE) 160-9-4.8 MCG/ACT AERO Inhale 2 puffs into the lungs in the morning and at bedtime.     citalopram (CELEXA) 10 MG tablet TAKE 1 TABLET BY MOUTH AT  BEDTIME    ezetimibe (ZETIA) 10 MG tablet TAKE 1 TABLET BY MOUTH  DAILY    gabapentin (NEURONTIN) 100 MG capsule TAKE 2 CAPSULES BY MOUTH 3  TIMES DAILY    hydrochlorothiazide (HYDRODIURIL) 25 MG tablet TAKE 1 TABLET BY MOUTH  DAILY    losartan (COZAAR) 100 MG tablet TAKE 1 TABLET(100 MG) BY MOUTH DAILY    meclizine (ANTIVERT) 25 MG tablet Take 1 tablet (25 mg total) by mouth 2 (two) times daily as needed for dizziness. (Patient not taking: Reported on 06/21/2021) 01/24/2017: As needed   mirtazapine (REMERON) 30 MG tablet TAKE 1 TABLET BY MOUTH AT  BEDTIME    OXYGEN Inhale into the lungs daily. Uses at night with CPAP and PRN during daytime    pantoprazole (PROTONIX) 40 MG tablet TAKE 1 TABLET BY MOUTH  TWICE DAILY    traZODone (DESYREL) 150 MG tablet TAKE 1 TABLET BY MOUTH AT  BEDTIME    [DISCONTINUED] albuterol (PROVENTIL HFA) 108 (90 Base) MCG/ACT inhaler Inhale 2 puffs into the lungs every 6 (six) hours as needed for wheezing or shortness of breath.    [DISCONTINUED] Budeson-Glycopyrrol-Formoterol (BREZTRI AEROSPHERE) 160-9-4.8 MCG/ACT AERO Inhale 160 mcg into the lungs daily.    [DISCONTINUED] cephALEXin (KEFLEX) 250 MG capsule Take 2 capsules (500 mg total) by mouth 2 (two) times daily. (Patient not taking: No sig reported) 03/11/2021: Complete   No facility-administered encounter medications on file as of 06/08/2021.    Patient Active Problem List   Diagnosis Date Noted  Incisional hernia, without obstruction or gangrene 06/03/2016   Osteoporosis 02/22/2016   Musculoskeletal chest pain 07/14/2015   Chronic respiratory failure ([place]) 07/14/2015   Acute bronchitis 07/14/2015   Other emphysema ([place]) 07/14/2015   Chronic renal insufficiency 07/14/2015   Chest pain 07/13/2015   Multiple pulmonary nodules 06/02/2015   Colon polyp 05/29/2015   Hemoptysis 04/29/2015   Sprain of ankle 04/14/2015   Anxiety 04/14/2015   Barrett's  esophagus 04/14/2015   Acute exacerbation of chronic obstructive airways disease ([place]) 04/14/2015   Bursitis 04/14/2015   Cellulitis 04/14/2015   Claudication ([place]) 04/14/2015   Cough 04/14/2015   Deficiency, 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   Clinical depression 04/14/2015   Dizziness 04/14/2015   Dyslipidemia 04/14/2015   Essential (primary) hypertension 04/14/2015   Flank pain 04/14/2015   H/O suicide attempt 04/14/2015   Dysphonia 04/14/2015   Hypercholesteremia 04/14/2015   Cannot sleep 04/14/2015   Malaise and fatigue 04/14/2015   Mild cognitive impairment 04/14/2015   Cramp in muscle 04/14/2015   Muscle ache 04/14/2015   Neuropathy 04/14/2015   Adiposity 04/14/2015   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   Candida infection of mouth 04/14/2015   Abnormal loss of weight 04/14/2015   Decreased body weight 04/14/2015   Artificial cardiac pacemaker 10/08/2014   Apnea, sleep 10/08/2014   COPD (chronic obstructive pulmonary disease) ([name]) 02/13/2012   Tobacco abuse 02/13/2012   Sinus congestion 02/13/2012   Current tobacco use 02/13/2012   Esophagitis, reflux 08/28/2006   Abdominal pain, right lower quadrant 08/22/2005   Acute appendicitis with peritoneal abscess 08/22/2005    Conditions to be addressed/monitored:COPD and Medication  Patient Care Plan: COPD (Adult)     Problem Identified: Symptom Exacerbation (COPD)      Long-Range Goal: Symptom Exacerbation Prevented or Minimized   Start Date: 05/21/2021  Expected End Date: 09/18/2021  Priority: High  Note:   Current Barriers:  Chronic Disease Management needs and support r/t COPD self-management  Case Manager Clinical Goal(s): Over the next 120 days, patient will not require hospitalization or emergent care d/t complications r/t COPD exacerbation.   Interventions:  Collaboration with Jerrol Banana., MD regarding development and update of comprehensive plan of care as evidenced by provider attestation and  co-signature Inter-disciplinary care team collaboration (see longitudinal plan of care) Reviewed medications and compliance with treatment plan. Reports using inhalers as prescribed. Reports symptoms have been well controlled since starting the [facility] inhaler. She will keep the care management team updated of concerns regarding cost. Continues to experience exertional dyspnea but reports doing very well overall. Denies change or decline in activity tolerance. Reports only requiring supplemental oxygen at night. Currently using 3L/min via her CPAP.  Reviewed risks for complications r/t tobacco use.  Reports currently smoking only 3 cigarettes/day. Continues to work towards her goal of quitting. Advised to continue assessing symptoms r/t COPD daily. Advised to notify a provider for evaluation if she remains in the yellow zone and  symptoms are not improving. Reviewed worsening/unrelieved s/sx that require immediate medical attention. Update 06/08/21- Referral submitted to CCM Pharmacist. Mrs. Zenia Resides requires assistance with [employer].   Patient Self Care Activities:  -Self administer medications and utilize inhalers as prescribed -Monitor symptoms and follow recommended COPD Action plan -Consider plan for smoking cessation -Avoid known COPD "triggers" and activities that may cause overexertion. -Complete outreach with CCM Pharmacist to discuss options for medication assistance -Contact provider or care management team with new questions  and concerns   Follow Up Plan: Will follow-up in two  months        PLAN:  Referral submitted for medication assistance. Will complete next nurse care management outreach in August.    [place] [phone]

## 2021-06-09 ENCOUNTER — Telehealth: Payer: Self-pay | Admitting: *Deleted

## 2021-06-09 NOTE — Chronic Care Management (AMB) (Signed)
  Chronic Care Management   Note  06/09/2021 Name: Crystal White MRN: [id number] DOB: 11/23/1941  Crystal White is a 80 y.o. year old female who is a primary care patient of Jerrol Banana., MD. Crystal White is currently enrolled in care management services. An additional referral for Pharmacy was placed.   Follow up plan: Telephone appointment with care management team member scheduled for:06/21/2021  Foster Frericks  Care Guide, Embedded Care Coordination [place]  Care Management

## 2021-06-14 ENCOUNTER — Other Ambulatory Visit: Payer: Self-pay

## 2021-06-14 ENCOUNTER — Ambulatory Visit: Payer: Medicare Other | Admitting: Dermatology

## 2021-06-14 ENCOUNTER — Telehealth: Payer: Self-pay

## 2021-06-14 DIAGNOSIS — L820 Inflamed seborrheic keratosis: Secondary | ICD-10-CM

## 2021-06-14 DIAGNOSIS — L720 Epidermal cyst: Secondary | ICD-10-CM

## 2021-06-14 DIAGNOSIS — L578 Other skin changes due to chronic exposure to nonionizing radiation: Secondary | ICD-10-CM | POA: Diagnosis not present

## 2021-06-14 DIAGNOSIS — L814 Other melanin hyperpigmentation: Secondary | ICD-10-CM

## 2021-06-14 DIAGNOSIS — L603 Nail dystrophy: Secondary | ICD-10-CM | POA: Diagnosis not present

## 2021-06-14 DIAGNOSIS — Z1283 Encounter for screening for malignant neoplasm of skin: Secondary | ICD-10-CM

## 2021-06-14 DIAGNOSIS — D1800 Hemangioma unspecified site: Secondary | ICD-10-CM

## 2021-06-14 DIAGNOSIS — L570 Actinic keratosis: Secondary | ICD-10-CM

## 2021-06-14 DIAGNOSIS — D229 Melanocytic nevi, unspecified: Secondary | ICD-10-CM

## 2021-06-14 DIAGNOSIS — L821 Other seborrheic keratosis: Secondary | ICD-10-CM

## 2021-06-14 MED ORDER — TAVABOROLE 5 % EX SOLN: CUTANEOUS | 11 refills | Status: DC

## 2021-06-14 NOTE — Patient Instructions (Addendum)
Melanoma ABCDEs  Melanoma is the most dangerous type of skin cancer, and is the leading cause of death from skin disease.  You are more likely to develop melanoma if you: Have light-colored skin, light-colored eyes, or red or blond hair Spend a lot of time in the sun Tan regularly, either outdoors or in a tanning bed Have had blistering sunburns, especially during childhood Have a close family member who has had a melanoma Have atypical moles or large birthmarks  Early detection of melanoma is key since treatment is typically straightforward and cure rates are extremely high if we catch it early.   The first sign of melanoma is often a change in a mole or a new dark spot.  The ABCDE system is a way of remembering the signs of melanoma.  A for asymmetry:  The two halves do not match. B for border:  The edges of the growth are irregular. C for color:  A mixture of colors are present instead of an even brown color. D for diameter:  Melanomas are usually (but not always) greater than 64mm - the size of a pencil eraser. E for evolution:  The spot keeps changing in size, shape, and color.  Please check your skin once per month between visits. You can use a small mirror in front and a large mirror behind you to keep an eye on the back side or your body.   If you see any new or changing lesions before your next follow-up, please call to schedule a visit.  Seborrheic Keratosis  What causes seborrheic keratoses? Seborrheic keratoses are harmless, common skin growths that first appear during adult life.  As time goes by, more growths appear.  Some people may develop a large number of them.  Seborrheic keratoses appear on both covered and uncovered body parts.  They are not caused by sunlight.  The tendency to develop seborrheic keratoses can be inherited.  They vary in color from skin-colored to gray, brown, or even black.  They can be either smooth or have a rough, warty surface.   Seborrheic  keratoses are superficial and look as if they were stuck on the skin.  Under the microscope this type of keratosis looks like layers upon layers of skin.  That is why at times the top layer may seem to fall off, but the rest of the growth remains and re-grows.    Treatment Seborrheic keratoses do not need to be treated, but can easily be removed in the office.  Seborrheic keratoses often cause symptoms when they rub on clothing or jewelry.  Lesions can be in the way of shaving.  If they become inflamed, they can cause itching, soreness, or burning.  Removal of a seborrheic keratosis can be accomplished by freezing, burning, or surgery. If any spot bleeds, scabs, or grows rapidly, please return to have it checked, as these can be an indication of a skin cancer.  Please continue daily skin protection including broad spectrum sunscreen SPF 30+ to sun-exposed areas, reapplying every 2 hours as needed when you're outdoors.   Staying in the shade or wearing long sleeves, sun glasses (UVA+UVB protection) and wide brim hats (4-inch brim around the entire circumference of the hat) are also recommended for sun protection.    Actinic keratoses are precancerous spots that appear secondary to cumulative UV radiation exposure/sun exposure over time. They are chronic with expected duration over 1 year. A portion of actinic keratoses will progress to squamous cell carcinoma of the  skin. It is not possible to reliably predict which spots will progress to skin cancer and so treatment is recommended to prevent development of skin cancer.  Recommend daily broad spectrum sunscreen SPF 30+ to sun-exposed areas, reapply every 2 hours as needed.  Recommend staying in the shade or wearing long sleeves, sun glasses (UVA+UVB protection) and wide brim hats (4-inch brim around the entire circumference of the hat). Call for new or changing lesions.

## 2021-06-14 NOTE — Progress Notes (Signed)
New Patient Visit  Subjective  Crystal White is a 80 y.o. female who presents for the following: New Patient (Initial Visit) (Patient here as a new patient. Patient reports no history of skin cancer. She denies family history of skin cancer. She report a few spots on back that she would like checked. One spot patient reports she occasionaly scratches off but comes back. She also reports some brown spots at temple and some other bumps on face and scalp. ).  The following portions of the chart were reviewed this encounter and updated as appropriate:   Tobacco  Allergies  Meds  Problems  Med Hx  Surg Hx  Fam Hx     Review of Systems:  No other skin or systemic complaints except as noted in HPI or Assessment and Plan.  Objective  Well appearing patient in no apparent distress; mood and affect are within normal limits.  A full examination was performed including scalp, head, eyes, ears, nose, lips, neck, chest, axillae, abdomen, back, buttocks, bilateral upper extremities, bilateral lower extremities, hands, feet, fingers, toes, fingernails, and toenails. All findings within normal limits unless otherwise noted below.  ears and face  x 7 (7) Erythematous thin papules/macules with gritty scale.   Right Flank x 1, left back x 1 (2) Erythematous keratotic or waxy stuck-on papule or plaque.   face and scalp Smooth white patches of face and scalp   Assessment & Plan  Actinic keratosis ears and face  x 7  Actinic keratoses are precancerous spots that appear secondary to cumulative UV radiation exposure/sun exposure over time. They are chronic with expected duration over 1 year. A portion of actinic keratoses will progress to squamous cell carcinoma of the skin. It is not possible to reliably predict which spots will progress to skin cancer and so treatment is recommended to prevent development of skin cancer.  Recommend daily broad spectrum sunscreen SPF 30+ to sun-exposed areas,  reapply every 2 hours as needed.  Recommend staying in the shade or wearing long sleeves, sun glasses (UVA+UVB protection) and wide brim hats (4-inch brim around the entire circumference of the hat). Call for new or changing lesions.  Prior to procedure, discussed risks of blister formation, small wound, skin dyspigmentation, or rare scar following cryotherapy. Recommend Vaseline ointment to treated areas while healing.  Destruction of lesion - ears and face  x 7 Complexity: simple   Destruction method: cryotherapy   Informed consent: discussed and consent obtained   Timeout:  patient name, date of birth, surgical site, and procedure verified Lesion destroyed using liquid nitrogen: Yes   Region frozen until ice ball extended beyond lesion: Yes   Outcome: patient tolerated procedure well with no complications   Post-procedure details: wound care instructions given    Inflamed seborrheic keratosis Right Flank x 1, left back x 1  Prior to procedure, discussed risks of blister formation, small wound, skin dyspigmentation, or rare scar following cryotherapy. Recommend Vaseline ointment to treated areas while healing.  Destruction of lesion - Right Flank x 1, left back x 1 Complexity: simple   Destruction method: cryotherapy   Informed consent: discussed and consent obtained   Timeout:  patient name, date of birth, surgical site, and procedure verified Lesion destroyed using liquid nitrogen: Yes   Region frozen until ice ball extended beyond lesion: Yes   Outcome: patient tolerated procedure well with no complications   Post-procedure details: wound care instructions given    Nail dystrophy Right Hallux Toe Nail  Plate With Tinea unguium  Chronic and persistent Start Kerydin 5 % soln - apply to toenails and fingernails nightly 11 rf 10 ml sent to [facility] in [place]  If insurance does not cover [place] or [place], will send in rx for [place].  Tavaborole (KERYDIN) 5 % SOLN - Right  Hallux Toe Nail Plate Apply to toenails and fingernails at bedtime daily  Milia face and scalp Benign-appearing.  Observation.  Call clinic for new or changing lesions.  Recommend daily use of broad spectrum spf 30+ sunscreen to sun-exposed areas.   Skin cancer screening  Lentigines - Scattered tan macules - Due to sun exposure - Benign-appering, observe - Recommend daily broad spectrum sunscreen SPF 30+ to sun-exposed areas, reapply every 2 hours as needed. - Call for any changes  Seborrheic Keratoses - Stuck-on, waxy, tan-brown papules and/or plaques  - Benign-appearing - Discussed benign etiology and prognosis. - Observe - Call for any changes  Melanocytic Nevi - Tan-brown and/or pink-flesh-colored symmetric macules and papules - Benign appearing on exam today - Observation - Call clinic for new or changing moles - Recommend daily use of broad spectrum spf 30+ sunscreen to sun-exposed areas.   Hemangiomas - Red papules - Discussed benign nature - Observe - Call for any changes  Actinic Damage - Chronic condition, secondary to cumulative UV/sun exposure - diffuse scaly erythematous macules with underlying dyspigmentation - Recommend daily broad spectrum sunscreen SPF 30+ to sun-exposed areas, reapply every 2 hours as needed.  - Staying in the shade or wearing long sleeves, sun glasses (UVA+UVB protection) and wide brim hats (4-inch brim around the entire circumference of the hat) are also recommended for sun protection.  - Call for new or changing lesions.  Skin cancer screening performed today.  Return in about 1 year (around 06/14/2022) for tbse.  IRuthell Rummage, CMA, am acting as scribe for Sarina Ser, MD.  Documentation: I have reviewed the above documentation for accuracy and completeness, and I agree with the above.  Sarina Ser, MD

## 2021-06-14 NOTE — Progress Notes (Signed)
Chronic Care Management Pharmacy Assistant   Name: Crystal White  MRN: [id number] DOB: 09-Sep-1941  Patient Initial Chart Prep for appointment with Crystal White, CPP on 12/25 @ 1230   Conditions to be addressed/monitored: HTN, COPD, Anxiety, Depression, and Claudication, Dyslipidemia, Hypercholesteremia , Adiposity,   Primary concerns for visit include: Patient reports she went to Dermatology this week as she has fungus on her nails and the medication was $1500.00  the insurance did not cover any of it at all. I asked patient has she contacted Dermatology to inform them and to see if maybe they can provide samples or if the company who makes the medication offers coupons to make the medication affordable and she stated not at this time but she will contact them to see.  Recent office visits:  12/21/2020 [name], MD (PCP Video Visit) for follow-up- No orders placed at this visit, no medication changes noted  Recent consult visits:  04/02/2021 Vernard Gambles, MD (Pulmonology) For Follow-up- Reassess Pulmonary function testing ordered, Echocardiogram complete ordered, Trail of Breztri 2 puffs daily; Stop Breo; Oxygen order placed; Patient instructed to follow-up in 4 to 6 weeks.  02/10/2021 Isaias Cowman, MD (Cardiology) for Hospital follow-up- No medication changes noted. Patient instructed to return in 4 months  01/20/2021 Isaias Cowman, MD (Cardiology) Follow-up for Hypertension and Atrioventricular block No medication changes noted, X-ray Chest PA and lateral ordered, Labs ordered  Hospital visits:  Medication Reconciliation was completed by comparing discharge summary, patient's EMR and Pharmacy list, and upon discussion with patient.  Admitted to the hospital on 02/02/2021 due to [place]. Discharge date was 02/02/2021. Discharged from [facility]?Medications Started at [facility] Discharge:?? -started  cephALEXin (Keflex)   Medication Changes at Hospital Discharge: -Changed None ID  Medications Discontinued at Hospital Discharge: -Stopped None ID  Medications that remain the same after Hospital Discharge:??  -All other medications will remain the same.    Medications: Outpatient Encounter Medications as of 06/14/2021  Medication Sig Note   acetaminophen (TYLENOL) 500 MG tablet Take 1,000 mg by mouth every 6 (six) hours as needed (pain/headaches.).     albuterol (PROVENTIL HFA) 108 (90 Base) MCG/ACT inhaler Inhale 2 puffs into the lungs every 6 (six) hours as needed for wheezing or shortness of breath.    Budeson-Glycopyrrol-Formoterol (BREZTRI AEROSPHERE) 160-9-4.8 MCG/ACT AERO Inhale 160 mcg into the lungs daily.    Budeson-Glycopyrrol-Formoterol (BREZTRI AEROSPHERE) 160-9-4.8 MCG/ACT AERO Inhale 2 puffs into the lungs in the morning and at bedtime.    cephALEXin (KEFLEX) 250 MG capsule Take 2 capsules (500 mg total) by mouth 2 (two) times daily. (Patient not taking: No sig reported) 03/11/2021: Complete   citalopram (CELEXA) 10 MG tablet TAKE 1 TABLET BY MOUTH AT  BEDTIME    ezetimibe (ZETIA) 10 MG tablet TAKE 1 TABLET BY MOUTH  DAILY    gabapentin (NEURONTIN) 100 MG capsule TAKE 2 CAPSULES BY MOUTH 3  TIMES DAILY    hydrochlorothiazide (HYDRODIURIL) 25 MG tablet TAKE 1 TABLET BY MOUTH  DAILY    losartan (COZAAR) 100 MG tablet TAKE 1 TABLET(100 MG) BY MOUTH DAILY    meclizine (ANTIVERT) 25 MG tablet Take 1 tablet (25 mg total) by mouth 2 (two) times daily as needed for dizziness. 01/24/2017: As needed   mirtazapine (REMERON) 30 MG tablet TAKE 1 TABLET BY MOUTH AT  BEDTIME    OXYGEN Inhale into the lungs daily. Uses at night with CPAP and PRN during  daytime    pantoprazole (PROTONIX) 40 MG tablet TAKE 1 TABLET BY MOUTH  TWICE DAILY    traZODone (DESYREL) 150 MG tablet TAKE 1 TABLET BY MOUTH AT  BEDTIME    No facility-administered encounter medications on file as of 06/14/2021.    Care  Gaps: Hepatitis C Screening   Star Rating Drugs: Losartan 100 mg last filled on 05/28/2021 for a 90-Day supply with [facility]    Have you seen any other providers since your last visit? Yes her specialist  Any changes in your medications or health? No changes at this time  Any side effects from any medications? no  Do you have an symptoms or problems not managed by your medications? no  Any concerns about your health right now? no  Has your provider asked that you check blood pressure, blood sugar, or follow special diet at home? Yes, and she will have numbers available for her BP  Do you get any type of exercise on a regular basis? Yes she tries to keep active she does have issues with her breathing which limits her  Can you think of a goal you would like to reach for your health? Be able to vacuum a whole room without being able to stop  Do you have any problems getting your medications? no  Is there anything that you would like to discuss during the appointment? Nothing that she can think of at this moment  Please bring medications and supplements to appointment    Lynann Bologna, [place] Pharmacist Assistant Phone: [phone]

## 2021-06-16 ENCOUNTER — Encounter: Payer: Self-pay | Admitting: Dermatology

## 2021-06-21 ENCOUNTER — Ambulatory Visit: Payer: Medicare Other

## 2021-06-21 DIAGNOSIS — J449 Chronic obstructive pulmonary disease, unspecified: Secondary | ICD-10-CM | POA: Diagnosis not present

## 2021-06-21 DIAGNOSIS — F324 Major depressive disorder, single episode, in partial remission: Secondary | ICD-10-CM | POA: Diagnosis not present

## 2021-06-21 DIAGNOSIS — I10 Essential (primary) hypertension: Secondary | ICD-10-CM

## 2021-06-21 DIAGNOSIS — E7800 Pure hypercholesterolemia, unspecified: Secondary | ICD-10-CM | POA: Diagnosis not present

## 2021-06-21 DIAGNOSIS — J441 Chronic obstructive pulmonary disease with (acute) exacerbation: Secondary | ICD-10-CM

## 2021-06-21 DIAGNOSIS — F419 Anxiety disorder, unspecified: Secondary | ICD-10-CM

## 2021-06-21 MED ORDER — ALBUTEROL SULFATE HFA 108 (90 BASE) MCG/ACT IN AERS: 2.0000 | INHALATION_SPRAY | Freq: Four times a day (QID) | RESPIRATORY_TRACT | 3 refills | Status: DC | PRN

## 2021-06-21 NOTE — Progress Notes (Signed)
Chronic Care Management Pharmacy Note  06/21/2021 Name:  Crystal White MRN:  [id number] DOB:  12/08/1940  Summary: Patient presents for initial CCM consult. She reports she is doing well overall with her medications, but does report her blood pressure has been elevated at home recently. She is also concerned about her elevated cholesterol and waking up throughout the night. Her Judithann Sauger becomes challenging to afford once she reaches the [facility]  Recommendations/Changes made from today's visit: Continue current medications Extensive dietary counseling discussed.  PAP for Breztri  Plan: CPP follow-up in 3 months   Subjective: Crystal White is an 80 y.o. year old female who is a primary patient of Jerrol Banana., MD.  The CCM team was consulted for assistance with disease management and care coordination needs.    Engaged with patient by telephone for initial visit in response to provider referral for pharmacy case management and/or care coordination services.   Consent to Services:  The patient was given the following information about Chronic Care Management services today, agreed to services, and gave verbal consent: 1. CCM service includes personalized support from designated clinical staff supervised by the primary care provider, including individualized plan of care and coordination with other care providers 2. 24/7 contact phone numbers for assistance for urgent and routine care needs. 3. Service will only be billed when office clinical staff spend 20 minutes or more in a month to coordinate care. 4. Only one practitioner may furnish and bill the service in a calendar month. 5.The patient may stop CCM services at any time (effective at the end of the month) by phone call to the office staff. 6. The patient will be responsible for cost sharing (co-pay) of up to 20% of the service fee (after annual deductible is met). Patient agreed to services and consent obtained.  Patient  Care Team: Jerrol Banana., MD as PCP - General (Unknown Physician Specialty) Samara Deist, DPM as Referring Physician (Podiatry) Isaias Cowman, MD as Consulting Physician (Cardiology) Neldon Labella, RN as Case Manager Germaine Pomfret, [facility] (Pharmacist)  Recent office visits: 12/21/2020 [name], MD (PCP Video Visit) for follow-up- No orders placed at this visit, no medication changes noted  Recent consult visits: 04/02/2021 Vernard Gambles, MD (Pulmonology) For Follow-up- Reassess Pulmonary function testing ordered, Echocardiogram complete ordered, Trail of Breztri 2 puffs daily; Stop Breo; Oxygen order placed; Patient instructed to follow-up in 4 to 6 weeks.   02/10/2021 Isaias Cowman, MD (Cardiology) for Hospital follow-up- No medication changes noted. Patient instructed to return in 4 months   01/20/2021 Isaias Cowman, MD (Cardiology) Follow-up for Hypertension and Atrioventricular block No medication changes noted, X-ray Chest PA and lateral ordered, Labs ordered  Hospital visits: Admitted to the hospital on 02/02/2021 due to [place]. Discharge date was 02/02/2021. Discharged from [place]?Medications Started at [facility] Discharge:?? -started cephALEXin (Keflex)    Medication Changes at Hospital Discharge: -Changed None ID   Medications Discontinued at Hospital Discharge: -Stopped None ID   Medications that remain the same after Hospital Discharge:?? -All other medications will remain the same.    Objective:  Lab Results  Component Value Date   CREATININE 1.83 (H) 11/11/2020   BUN 40 (H) 11/11/2020   GFRNONAA 28 (L) 11/11/2020   GFRAA 31 (L) 08/11/2020   NA 141 11/11/2020   K 4.1 11/11/2020   CALCIUM 8.9 11/11/2020   CO2 28 11/11/2020   GLUCOSE 98  11/11/2020    Lab Results  Component Value Date/Time   HGBA1C 5.5 01/24/2017 11:32 AM    Last diabetic Eye exam: No  results found for: HMDIABEYEEXA  Last diabetic Foot exam: No results found for: HMDIABFOOTEX   Lab Results  Component Value Date   CHOL 232 (H) 08/11/2020   HDL 52 08/11/2020   LDLCALC 148 (H) 08/11/2020   TRIG 180 (H) 08/11/2020   CHOLHDL 4.5 (H) 08/11/2020    Hepatic Function Latest Ref Rng & Units 08/11/2020 02/13/2020 10/19/2018  Total Protein 6.0 - 8.5 g/dL 6.9 6.6 7.1  Albumin 3.7 - 4.7 g/dL 4.3 4.1 4.8  AST 0 - 40 IU/L _0 ALT 0 - 32 IU/L _1 Alk Phosphatase 44 - 121 IU/L 89 95 78  Total Bilirubin 0.0 - 1.2 mg/dL 0.3 0.3 0.3    Lab Results  Component Value Date/Time   TSH 1.160 08/11/2020 10:46 AM   TSH 1.580 02/13/2020 04:42 PM    CBC Latest Ref Rng & Units 11/11/2020 08/11/2020 02/13/2020  WBC 4.0 - 10.5 K/uL 7.6 9.4 9.4  Hemoglobin 12.0 - 15.0 g/dL 15.6(H) 15.3 15.2  Hematocrit 36.0 - 46.0 % 47.4(H) 46.5 44.7  Platelets 150 - 400 K/uL 188 197 188    No results found for: VD25OH  Clinical ASCVD: No  The 10-year ASCVD risk score Mikey Bussing DC Jr., et al., 2013) is: 55.2%   Values used to calculate the score:     Age: 75 years     Sex: Female     Is Non-Hispanic African American: No     Diabetic: No     Tobacco smoker: Yes     Systolic Blood Pressure: 937 mmHg     Is BP treated: Yes     HDL Cholesterol: 52 mg/dL     Total Cholesterol: 232 mg/dL    Depression screen [facility] 2/9 01/12/2021 10/29/2020 04/28/2020  Decreased Interest 0 0 0  Down, Depressed, Hopeless 0 0 0  PHQ - 2 Score 0 0 0  Altered sleeping - - -  Tired, decreased energy - - -  Change in appetite - - -  Feeling bad or failure about yourself  - - -  Trouble concentrating - - -  Moving slowly or fidgety/restless - - -  Suicidal thoughts - - -  PHQ-9 Score - - -  Difficult doing work/chores - - -    Social History   Tobacco Use  Smoking Status Every Day   Packs/day: 1.00   Years: 55.00   Pack years: 55.00   Types: Cigarettes  Smokeless Tobacco Never  Tobacco Comments   Currently  smoking a pack/day   BP Readings from Last 3 Encounters:  04/02/21 (!) 170/84  02/02/21 (!) 157/58  11/23/20 (!) 158/60   Pulse Readings from Last 3 Encounters:  04/02/21 78  02/02/21 65  11/23/20 84   Wt Readings from Last 3 Encounters:  04/02/21 152 lb 6.4 oz (69.1 kg)  02/02/21 140 lb (63.5 kg)  11/23/20 140 lb (63.5 kg)   BMI Readings from Last 3 Encounters:  04/02/21 28.61 kg/m  02/02/21 23.66 kg/m  11/23/20 24.03 kg/m    Assessment/Interventions: Review of patient past medical history, allergies, medications, health status, including review of consultants reports, laboratory and other test data, was performed as part of comprehensive evaluation and provision of chronic care management services.   SDOH:  (Social Determinants of Health) assessments and interventions performed: Yes  SDOH Screenings  Alcohol Screen: Low Risk    Last Alcohol Screening Score (AUDIT): 1  Depression (PHQ2-9): Low Risk    PHQ-2 Score: 0  Financial Resource Strain: Not on file  Food Insecurity: No Food Insecurity   Worried About Charity fundraiser in the Last Year: Never true   Ran Out of Food in the Last Year: Never true  Housing: Not on file  Physical Activity: Not on file  Social Connections: Not on file  Stress: No Stress Concern Present   Feeling of Stress : Not at all  Tobacco Use: High Risk   Smoking Tobacco Use: Every Day   Smokeless Tobacco Use: Never  Transportation Needs: No Transportation Needs   Lack of Transportation (Medical): No   Lack of Transportation (Non-Medical): No    CCM Care Plan  Allergies  Allergen Reactions   Iodinated Diagnostic Agents Other (See Comments)    Burning sensation during contrast media injections. Patient states the IV contrast makes her very hot. She has had multiple CT Scans with IV contrast and states she only gets the warm sensation. This is not an allergic reaction, but is noted in her [place] chart as well. Aggie Hacker,  ARRT R CT, had this discussion with her on 01/10/19 at 1130am.   Bacitracin Swelling and Rash   Statins Other (See Comments) and Rash    Reaction:  Leg cramps     Medications Reviewed Today     Reviewed by Ralene Bathe, MD (Physician) on 06/16/21 at 1514  Med List Status: <None>   Medication Order Taking? Sig Documenting Provider Last Dose Status Informant  acetaminophen (TYLENOL) 500 MG tablet [id number] Yes Take 1,000 mg by mouth every 6 (six) hours as needed (pain/headaches.).  [provider] Taking Active Self  albuterol (PROVENTIL HFA) 108 (90 Base) MCG/ACT inhaler [id number] Yes Inhale 2 puffs into the lungs every 6 (six) hours as needed for wheezing or shortness of breath. Birdie Sons, MD Taking Active Self  Budeson-Glycopyrrol-Formoterol (BREZTRI AEROSPHERE) 160-9-4.8 MCG/ACT Hollie Salk [id number] Yes Inhale 160 mcg into the lungs daily. Tyler Pita, MD Taking Active   Budeson-Glycopyrrol-Formoterol [facility] AEROSPHERE) 160-9-4.8 MCG/ACT Hollie Salk [id number] Yes Inhale 2 puffs into the lungs in the morning and at bedtime. Tyler Pita, MD Taking Active   cephALEXin [facility]) 250 MG capsule [id number] No Take 2 capsules (500 mg total) by mouth 2 (two) times daily.  Patient not taking: No sig reported   Isaias Cowman, MD Not Taking Active            Med Note Minerva Ends, FELECIA N   Thu Mar 11, 2021 11:05 AM) Complete  citalopram (CELEXA) 10 MG tablet [id number] Yes TAKE 1 TABLET BY MOUTH AT  BEDTIME Jerrol Banana., MD Taking Active   ezetimibe (ZETIA) 10 MG tablet [id number] Yes TAKE 1 TABLET BY MOUTH  DAILY Jerrol Banana., MD Taking Active   gabapentin (NEURONTIN) 100 MG capsule [id number] Yes TAKE 2 CAPSULES BY MOUTH 3  TIMES DAILY Jerrol Banana., MD Taking Active   hydrochlorothiazide (HYDRODIURIL) 25 MG tablet [id number] Yes TAKE 1 TABLET BY MOUTH  DAILY Jerrol Banana., MD Taking Active   losartan (COZAAR) 100 MG tablet [id number] Yes  TAKE 1 TABLET(100 MG) BY MOUTH DAILY Jerrol Banana., MD Taking Active   meclizine (ANTIVERT) 25 MG tablet [id number] Yes Take 1 tablet (25 mg total) by mouth 2 (two) times daily as needed for dizziness. Jerrol Banana., MD Taking  Active            Med Note Arnold Long Jan 24, 2017 11:20 AM) As needed  mirtazapine (REMERON) 30 MG tablet [id number] Yes TAKE 1 TABLET BY MOUTH AT  BEDTIME Jerrol Banana., MD Taking Active   OXYGEN [id number] Yes Inhale into the lungs daily. Uses at night with CPAP and PRN during daytime [provider] Taking Active Self  pantoprazole (PROTONIX) 40 MG tablet [id number] Yes TAKE 1 TABLET BY MOUTH  TWICE DAILY Jerrol Banana., MD Taking Active   Tavaborole [facility]) 5 % SOLN [id number] Yes Apply to toenails and fingernails at bedtime daily Ralene Bathe, MD  Active   traZODone (DESYREL) 150 MG tablet [id number] Yes TAKE 1 TABLET BY MOUTH AT  BEDTIME Jerrol Banana., MD Taking Active             Patient Active Problem List   Diagnosis Date Noted   Incisional hernia, without obstruction or gangrene 06/03/2016   Osteoporosis 02/22/2016   Musculoskeletal chest pain 07/14/2015   Chronic respiratory failure ([place]) 07/14/2015   Acute bronchitis 07/14/2015   Other emphysema ([place]) 07/14/2015   Chronic renal insufficiency 07/14/2015   Chest pain 07/13/2015   Multiple pulmonary nodules 06/02/2015   Colon polyp 05/29/2015   Hemoptysis 04/29/2015   Sprain of ankle 04/14/2015   Anxiety 04/14/2015   Barrett's esophagus 04/14/2015   Acute exacerbation of chronic obstructive airways disease ([place]) 04/14/2015   Bursitis 04/14/2015   Cellulitis 04/14/2015   Claudication ([place]) 04/14/2015   Cough 04/14/2015   Deficiency, 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   Clinical depression 04/14/2015   Dizziness 04/14/2015   Dyslipidemia 04/14/2015   Essential (primary) hypertension 04/14/2015   Flank pain 04/14/2015    H/O suicide attempt 04/14/2015   Dysphonia 04/14/2015   Hypercholesteremia 04/14/2015   Cannot sleep 04/14/2015   Malaise and fatigue 04/14/2015   Mild cognitive impairment 04/14/2015   Cramp in muscle 04/14/2015   Muscle ache 04/14/2015   Neuropathy 04/14/2015   Adiposity 04/14/2015   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   Candida infection of mouth 04/14/2015   Abnormal loss of weight 04/14/2015   Decreased body weight 04/14/2015   Artificial cardiac pacemaker 10/08/2014   Apnea, sleep 10/08/2014   COPD (chronic obstructive pulmonary disease) ([place]) 02/13/2012   Tobacco abuse 02/13/2012   Sinus congestion 02/13/2012   Current tobacco use 02/13/2012   Esophagitis, reflux 08/28/2006   Abdominal pain, right lower quadrant 08/22/2005   Acute appendicitis with peritoneal abscess 08/22/2005    Immunization History  Administered Date(s) Administered   Fluad Quad(high Dose 65+) 08/11/2020   Influenza Split 09/20/2006, 09/02/2010, 09/10/2012   Influenza Whole 08/28/2012   Influenza, High Dose Seasonal PF 08/28/2014, 08/10/2015, 07/25/2016, 10/03/2017, 08/30/2018, 09/18/2019   Influenza-Unspecified 08/28/2013   Moderna SARS-COV2 Booster Vaccination 10/12/2020   Moderna Sars-Covid-2 Vaccination 01/09/2020, 02/06/2020   Pneumococcal Conjugate-13 08/28/2014   Pneumococcal Polysaccharide-23 03/07/2011   Tdap 08/28/2014   Zoster, Live 11/04/2012    Conditions to be addressed/monitored:  Hypertension, Hyperlipidemia, GERD, COPD, Depression, Anxiety, Osteoporosis, and Tobacco use  There are no care plans that you recently modified to display for this patient.    Medication Assistance: None required.  Patient affirms current coverage meets needs.  Compliance/Adherence/Medication fill history: Care Gaps: Hepatitis C Screening  Star-Rating Drugs: Losartan 100 mg last filled on 05/28/2021 for a 90-Day supply with [facility]  Patient's preferred pharmacy is:  [place], [place] -  [address] [place] [address] Phone: [phone] Fax: [place], [place] [address] AT [address] [place] [facility] [place] [address] Phone: [phone] Fax: [phone]  [facility]  ([place]) - [place], [place] [address] Phone: [phone] Fax: [phone]  Uses pill box? Yes Pt endorses 100% compliance  We discussed: Current pharmacy is preferred with insurance plan and patient is satisfied with pharmacy services Patient decided to: Continue current medication management strategy  Care Plan and Follow Up Patient Decision:  Patient agrees to Care Plan and Follow-up.  Plan: Telephone follow up appointment with care management team member scheduled for:  09/21/2021 at 1:00 PM  [name], PharmD, Para [place], [place] [phone]

## 2021-06-22 ENCOUNTER — Telehealth: Payer: Self-pay

## 2021-06-22 NOTE — Progress Notes (Signed)
    Chronic Care Management Pharmacy Assistant   Name: Crystal White  MRN: [id number] DOB: Oct 11, 1941  Task received from [name], CPP requesting a PAP for Bretri Inhaler be completed for patient. Application has been completed for the patient. I contacted the patient and informed her that the application would be mailed to her address on file that she confirmed. Patient was instructed to either bring the application to the clinic and give it to [name], CPP for him to fax for her or she can mail it to the clinic attention to [facility].  Patient did ask for a return envelope to mail it to the clinic. Application was emailed to Pattricia Boss, PTM for printing and mailing to patient.  Patient instructed to call me if she has any questions regarding the application. Patient verbalized understanding to all.  Lynann Bologna, CPA/CMA Clinical Pharmacist Assistant Phone: [phone]

## 2021-06-25 NOTE — Patient Instructions (Signed)
Visit Information It was great speaking with you today!  Please let me know if you have any questions about our visit.   Goals Addressed             This Visit's Progress    Track and Manage My Blood Pressure-Hypertension       Timeframe:  Long-Range Goal Priority:  High Start Date:  06/21/2021                           Expected End Date:  06/21/2022                     Follow Up Date 07/27/2021    - check blood pressure 3 times per week    Why is this important?   You won't feel high blood pressure, but it can still hurt your blood vessels.  High blood pressure can cause heart or kidney problems. It can also cause a stroke.  Making lifestyle changes like losing a little weight or eating less salt will help.  Checking your blood pressure at home and at different times of the day can help to control blood pressure.  If the doctor prescribes medicine remember to take it the way the doctor ordered.  Call the office if you cannot afford the medicine or if there are questions about it.     Notes:         Patient Care Plan: COPD (Adult)     Problem Identified: Symptom Exacerbation (COPD)      Long-Range Goal: Symptom Exacerbation Prevented or Minimized   Start Date: 05/21/2021  Expected End Date: 09/18/2021  Priority: High  Note:   Current Barriers:  Chronic Disease Management needs and support r/t COPD self-management  Case Manager Clinical Goal(s): Over the next 120 days, patient will not require hospitalization or emergent care d/t complications r/t COPD exacerbation.   Interventions:  Collaboration with Jerrol Banana., MD regarding development and update of comprehensive plan of care as evidenced by provider attestation and co-signature Inter-disciplinary care team collaboration (see longitudinal plan of care) Reviewed medications and compliance with treatment plan. Reports using inhalers as prescribed. Reports symptoms have been well controlled since starting  the [facility] inhaler. She will keep the care management team updated of concerns regarding cost. Continues to experience exertional dyspnea but reports doing very well overall. Denies change or decline in activity tolerance. Reports only requiring supplemental oxygen at night. Currently using 3L/min via her CPAP.  Reviewed risks for complications r/t tobacco use.  Reports currently smoking only 3 cigarettes/day. Continues to work towards her goal of quitting. Advised to continue assessing symptoms r/t COPD daily. Advised to notify a provider for evaluation if she remains in the yellow zone and  symptoms are not improving. Reviewed worsening/unrelieved s/sx that require immediate medical attention. Update 06/08/21- Referral submitted to CCM Pharmacist. Mrs. Zenia Resides requires assistance with [employer].   Patient Self Care Activities:  -Self administer medications and utilize inhalers as prescribed -Monitor symptoms and follow recommended COPD Action plan -Consider plan for smoking cessation -Avoid known COPD "triggers" and activities that may cause overexertion. -Complete outreach with CCM Pharmacist to discuss options for medication assistance -Contact provider or care management team with new questions  and concerns   Follow Up Plan: Will follow-up in two months      Patient Care Plan: Hypertension (Adult)     Problem Identified: Hypertension (Hypertension)      Long-Range  Goal: Hypertension Monitored   Start Date: 05/21/2021  Expected End Date: 09/18/2021  Priority: High  Note:   Objective:  Current Barriers:  Chronic Disease Management needs and support r/t Hypertension self management.  Case Manager Clinical Goal(s):  Over the next 120 days, patient will demonstrate improved adherence to prescribed treatment plan for hypertension management as evidenced by taking all medications as prescribed, monitoring and recording blood pressure and adhering to a heart healthy/cardiac prudent  diet.  Interventions:  Collaboration with Jerrol Banana., MD regarding development and update of comprehensive plan of care as evidenced by provider attestation and co-signature Inter-disciplinary care team collaboration (see longitudinal plan of care) Discussed compliance with current treatment plan. Reviewed established BP parameters and indications for notifying a provider. Reports excellent compliance with medications and monitoring her BP as recommended. Reports home readings have been within range. Reports doing very well with maintaining a heart healthy/cardiac prudent diet. She has very good family support and remains motivated to improve her overall health. Reviewed s/sx of heart attack, stroke and worsening symptoms that require immediate medical attention.  Patient Goals/Self-Care Activities - Self administer medications as prescribed - Consistently monitor BP and record readings. - Attend scheduled provider appointments and clinic BP checks when indicated - Follow recommended heart healthy/cardiac prudent diet - Contact provider or care management team with new questions and concerns as needed   Follow Up Plan:  Will follow up in two months    Patient Care Plan: Fall Risk     Problem Identified: High Risk for Falls      Long-Range Goal: Fall and  Injury Prevention Completed 05/21/2021  Start Date: 01/12/2021  Expected End Date: 05/12/2021  Priority: High  Note:   Current Barriers:  High Risk for fall related injuries d/t Osteoporosis.  Clinical Goal(s):  Over the next 120 days, patient will not experience accidental falls or require emergent care d/t fall related injuries.  Interventions:  Collaboration with Jerrol Banana., MD regarding development and update of comprehensive plan of care as evidenced by provider attestation and co-signature Inter-disciplinary care team collaboration (see longitudinal plan of care) Reviewed information regarding safety  and fall prevention measures. Denies recent falls. Reports ambulating well over the past few weeks. Denies change or decline in activity tolerance. Reports using assistive devices as recommended and continues to use a wheelchair when away from the home for prolonged periods. Very good family support. Declines need for additional assistance.   Patient Goals/Self-Care Activities -Utilize assistive device appropriately with all ambulation -Ensure pathways are clear and well lit -Change positions slowly and demonstrate self awareness at all times -Wear secure non skid footwear when ambulating -Contact provider or the care management team with questions and concerns as needed   Goal Met    Patient Care Plan: General Pharmacy (Adult)     Problem Identified: Hypertension, Hyperlipidemia, GERD, COPD, Depression, Anxiety, Osteoporosis, and Tobacco use   Priority: High     Long-Range Goal: Patient-Specific Goal   Start Date: 06/21/2021  Expected End Date: 06/25/2022  This Visit's Progress: On track  Priority: High  Note:   Current Barriers:  Unable to achieve control of blood pressure  Unable to achieve control of cholesterol  Pharmacist Clinical Goal(s):  Patient will achieve control of blood pressure as evidenced by BP less than 140/90 achieve control of cholesterol as evidenced by LDL less than 100 through collaboration with PharmD and provider.   Interventions: 1:1 collaboration with Jerrol Banana., MD regarding  development and update of comprehensive plan of care as evidenced by provider attestation and co-signature Inter-disciplinary care team collaboration (see longitudinal plan of care) Comprehensive medication review performed; medication list updated in electronic medical record  Hypertension (BP goal <140/90) -Uncontrolled -Current treatment: HCTZ 25 mg daily  Losartan 100 mg daily  -Medications previously tried: Amlodipine (fatigue),   -Current home readings:   149/70, typicaly [id number] systolics.  235/99 ("felt like I couldn't make it, fatigue). On recheck was 110R systolics -Current dietary habits: Salts food. Drinks 2 cups cafeine.  -Current exercise habits: Minimal, limited by COPD, breathlessness -Denies hypotensive/hypertensive symptoms -Counseled to monitor BP at home every other day, document, and provide log at future appointments -Recommended to continue current medication  Hyperlipidemia: (LDL goal < 100) -Uncontrolled -Current treatment: Ezetimibe 10 mg daily  -Medications previously tried: Statins   -Educated on Importance of limiting foods high in cholesterol; -Recommended to continue current medication  COPD (Goal: control symptoms and prevent exacerbations) -Controlled -Current treatment  Albuterol 2 puffs every 6 hours as needed Breztri 2 puffs twice daily  -Medications previously tried: NA  -Gold Grade: Gold 2 (FEV1 50-79%) -Current COPD Classification:  B (high sx, <2 exacerbations/yr) -Pulmonary function testing: FEV1 65% (2013) -Exacerbations requiring treatment in last 6 months: None -Patient reports consistent use of maintenance inhaler -Frequency of rescue inhaler use: Rarely, patient always takes when leaving home -Counseled on When to use rescue inhaler -Recommended to continue current medication  Depression/Anxiety (Goal: Maintain stable mood) -Controlled -Current treatment: Citalopram 10 mg nightly  Mirtazapine 30 mg nightly  Trazodone 150 mg nightly  -Medications previously tried/failed: NA -7 hours sleep. Takes 3 hours to fall asleep. Does wake up multiple times, falls back asleep quickly. -PHQ9: 0 -GAD7: 0 -Recommended to continue current medication  GERD (Goal: Prevent ) -Controlled -History of Hitial hernia, Barrett's Esophagus   -Current treatment  Pantoprazole 40 mg twice daily  -Medications previously tried: NA  -Very rarely has symptoms. Has only ever taken twice daily.  -Recommended  to continue current medication  Chronic Kidney Disease Stage 3b  -All medications assessed for renal dosing and appropriateness in chronic kidney disease. -Recommended to continue current medication  Patient Goals/Self-Care Activities Patient will:  - check blood pressure every other day, document, and provide at future appointments  Follow Up Plan: Telephone follow up appointment with care management team member scheduled for:  09/21/2021 at 1:00 PM       Ms. Viramontes was given information about Chronic Care Management services today including:  CCM service includes personalized support from designated clinical staff supervised by her physician, including individualized plan of care and coordination with other care providers 24/7 contact phone numbers for assistance for urgent and routine care needs. Standard insurance, coinsurance, copays and deductibles apply for chronic care management only during months in which we provide at least 20 minutes of these services. Most insurances cover these services at 100%, however patients may be responsible for any copay, coinsurance and/or deductible if applicable. This service may help you avoid the need for more expensive face-to-face services. Only one practitioner may furnish and bill the service in a calendar month. The patient may stop CCM services at any time (effective at the end of the month) by phone call to the office staff.  Patient agreed to services and verbal consent obtained.   Patient verbalizes understanding of instructions provided today and agrees to view in [place].   [name], PharmD, BCACP, CPP  Clinical Pharmacist [facility]  [phone]

## 2021-07-02 ENCOUNTER — Ambulatory Visit (INDEPENDENT_AMBULATORY_CARE_PROVIDER_SITE_OTHER): Payer: Medicare Other

## 2021-07-02 DIAGNOSIS — I10 Essential (primary) hypertension: Secondary | ICD-10-CM

## 2021-07-02 DIAGNOSIS — J449 Chronic obstructive pulmonary disease, unspecified: Secondary | ICD-10-CM

## 2021-07-02 NOTE — Chronic Care Management (AMB) (Signed)
Chronic Care Management   CCM RN Visit Note  07/02/2021 Name: Crystal White MRN: [id number] DOB: 12-04-1940  Subjective: Crystal White is a 80 y.o. year old female who is a primary care patient of Jerrol Banana., MD. The care management team was consulted for assistance with disease management and care coordination needs.    Engaged with patient by telephone for follow up visit in response to provider referral for case management and care coordination services.   Consent to Services:  The patient was given information about Chronic Care Management services, agreed to services, and gave verbal consent prior to initiation of services.  Please see initial visit note for detailed documentation.    Assessment: Review of patient past medical history, allergies, medications, health status, including review of consultants reports, laboratory and other test data, was performed as part of comprehensive evaluation and provision of chronic care management services.   SDOH (Social Determinants of Health) assessments and interventions performed:  No  CCM Care Plan  Allergies  Allergen Reactions   Iodinated Diagnostic Agents Other (See Comments)    Burning sensation during contrast media injections. Patient states the IV contrast makes her very hot. She has had multiple CT Scans with IV contrast and states she only gets the warm sensation. This is not an allergic reaction, but is noted in her [place] chart as well. Crystal White, ARRT R CT, had this discussion with her on 01/10/19 at 1130am.   Bacitracin Swelling and Rash   Statins Other (See Comments) and Rash    Reaction:  Leg cramps     Outpatient Encounter Medications as of 07/02/2021  Medication Sig Note   acetaminophen (TYLENOL) 500 MG tablet Take 1,000 mg by mouth every 6 (six) hours as needed (pain/headaches.).     albuterol (PROVENTIL HFA) 108 (90 Base) MCG/ACT inhaler Inhale 2 puffs into the lungs every 6 (six) hours as  needed for wheezing or shortness of breath.    Budeson-Glycopyrrol-Formoterol (BREZTRI AEROSPHERE) 160-9-4.8 MCG/ACT AERO Inhale 2 puffs into the lungs in the morning and at bedtime.    citalopram (CELEXA) 10 MG tablet TAKE 1 TABLET BY MOUTH AT  BEDTIME    ezetimibe (ZETIA) 10 MG tablet TAKE 1 TABLET BY MOUTH  DAILY    gabapentin (NEURONTIN) 100 MG capsule TAKE 2 CAPSULES BY MOUTH 3  TIMES DAILY    hydrochlorothiazide (HYDRODIURIL) 25 MG tablet TAKE 1 TABLET BY MOUTH  DAILY    losartan (COZAAR) 100 MG tablet TAKE 1 TABLET(100 MG) BY MOUTH DAILY    meclizine (ANTIVERT) 25 MG tablet Take 1 tablet (25 mg total) by mouth 2 (two) times daily as needed for dizziness. (Patient not taking: Reported on 06/21/2021) 01/24/2017: As needed   mirtazapine (REMERON) 30 MG tablet TAKE 1 TABLET BY MOUTH AT  BEDTIME    OXYGEN Inhale into the lungs daily. Uses at night with CPAP and PRN during daytime    pantoprazole (PROTONIX) 40 MG tablet TAKE 1 TABLET BY MOUTH  TWICE DAILY    Tavaborole (KERYDIN) 5 % SOLN Apply to toenails and fingernails at bedtime daily    traZODone (DESYREL) 150 MG tablet TAKE 1 TABLET BY MOUTH AT  BEDTIME    No facility-administered encounter medications on file as of 07/02/2021.    Patient Active Problem List   Diagnosis Date Noted   Incisional hernia, without obstruction or gangrene 06/03/2016   Osteoporosis 02/22/2016   Musculoskeletal chest pain 07/14/2015   Chronic respiratory failure ([place]) 07/14/2015  Acute bronchitis 07/14/2015   Other emphysema ([place]) 07/14/2015   Chronic renal insufficiency 07/14/2015   Chest pain 07/13/2015   Multiple pulmonary nodules 06/02/2015   Colon polyp 05/29/2015   Hemoptysis 04/29/2015   Sprain of ankle 04/14/2015   Anxiety 04/14/2015   Barrett's esophagus 04/14/2015   Acute exacerbation of chronic obstructive airways disease (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   Clinical depression 04/14/2015   Dizziness 04/14/2015   Dyslipidemia 04/14/2015   Essential (primary) hypertension 04/14/2015   Flank pain 04/14/2015   H/O suicide attempt 04/14/2015   Dysphonia 04/14/2015   Hypercholesteremia 04/14/2015   Cannot sleep 04/14/2015   Malaise and fatigue 04/14/2015   Mild cognitive impairment 04/14/2015   Cramp in muscle 04/14/2015   Muscle ache 04/14/2015   Neuropathy 04/14/2015   Adiposity 04/14/2015   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   Candida infection of mouth 04/14/2015   Abnormal loss of weight 04/14/2015   Decreased body weight 04/14/2015   Artificial cardiac pacemaker 10/08/2014   Apnea, sleep 10/08/2014   COPD (chronic obstructive pulmonary disease) ([place]) 02/13/2012   Tobacco abuse 02/13/2012   Sinus congestion 02/13/2012   Current tobacco use 02/13/2012   Esophagitis, reflux 08/28/2006   Abdominal pain, right lower quadrant 08/22/2005   Acute appendicitis with peritoneal abscess 08/22/2005    Conditions to be addressed/monitored:HTN and COPD Patient Care Plan: COPD (Adult)     Problem Identified: Symptom Exacerbation (COPD)      Long-Range Goal: Symptom Exacerbation Prevented or Minimized   Start Date: 05/21/2021  Expected End Date: 09/18/2021  Priority: High  Note:   Current Barriers:  Chronic Disease Management needs and support r/t COPD self-management  Case Manager Clinical Goal(s): Over the next 120 days, patient will not require hospitalization or emergent care d/t complications r/t COPD exacerbation.   Interventions:  Collaboration with Jerrol Banana., MD regarding development and update of comprehensive plan of care as evidenced by provider attestation and co-signature Inter-disciplinary care team collaboration (see longitudinal plan of care) Reviewed medications and compliance with treatment plan. Reports taking medications as prescribed. Reports symptoms have  been well controlled and activity tolerance has improved. Continues to use supplemental oxygen via CPAP at 3L/min. Advised to continue assessing symptoms r/t COPD daily. Advised to notify a provider for evaluation if symptoms persist for 48 hours without improvement.  Reviewed worsening s/sx that require immediate medical attention.   Patient Self Care Activities:  Self administer medications and utilize inhalers as prescribed Monitor symptoms and follow recommended COPD Action plan Consider plan for smoking cessation Avoid known COPD "triggers" and activities that may cause overexertion. Contact provider or care management team with new questions  and concerns   Follow Up Plan: Will follow-up in two months      Patient Care Plan: Hypertension (Adult)     Problem Identified: Hypertension (Hypertension)      Long-Range Goal: Hypertension Monitored   Start Date: 05/21/2021  Expected End Date: 09/18/2021  Priority: High  Note:   Objective:  Current Barriers:  Chronic Disease Management needs and support r/t Hypertension self management.  Case Manager Clinical Goal(s):  Over the next 120 days, patient will demonstrate improved adherence to prescribed treatment plan for hypertension management as evidenced by taking all medications as prescribed, monitoring and recording blood pressure and adhering to a heart healthy/cardiac prudent diet.  Interventions:  Collaboration with Eulas Post  Brooke Bonito., MD regarding development and update of comprehensive plan of care as evidenced by provider attestation and co-signature Inter-disciplinary care team collaboration (see longitudinal plan of care) Discussed compliance with current treatment plan. Reviewed established BP parameters. Reports excellent compliance with medications and monitoring her BP as recommended. Reports home readings have ranged in the low 140's over 70's. Continues to monitor sodium intake. Reports doing well with  maintaining a heart healthy diet. Reviewed s/sx of heart attack, stroke and worsening symptoms that require immediate medical attention.  Patient Goals/Self-Care Activities Self administer medications as prescribed Consistently monitor BP and record readings. Follow recommended heart healthy/cardiac prudent diet Contact provider or care management team with new questions and concerns as needed   Follow Up Plan:  Will follow up in two months     PLAN: A member of the care management team will follow up in two months.   Crystal White [facility] Care Management [facility] [phone]

## 2021-07-02 NOTE — Patient Instructions (Addendum)
Thank you for allowing the Chronic Care Management team to participate in your care.   Goals Addressed: Patient Care Plan: COPD (Adult)     Problem Identified: Symptom Exacerbation (COPD)      Long-Range Goal: Symptom Exacerbation Prevented or Minimized   Start Date: 05/21/2021  Expected End Date: 09/18/2021  Priority: High  Note:   Current Barriers:  Chronic Disease Management needs and support r/t COPD self-management  Case Manager Clinical Goal(s): Over the next 120 days, patient will not require hospitalization or emergent care d/t complications r/t COPD exacerbation.   Interventions:  Collaboration with Crystal White., MD regarding development and update of comprehensive plan of care as evidenced by provider attestation and co-signature Inter-disciplinary care team collaboration (see longitudinal plan of care) Reviewed medications and compliance with treatment plan. Reports taking medications as prescribed. Reports symptoms have been well controlled and activity tolerance has improved. Continues to use supplemental oxygen via CPAP at 3L/min. Advised to continue assessing symptoms r/t COPD daily. Advised to notify a provider for evaluation if symptoms persist for 48 hours without improvement.  Reviewed worsening s/sx that require immediate medical attention.   Patient Self Care Activities:  Self administer medications and utilize inhalers as prescribed Monitor symptoms and follow recommended COPD Action plan Consider plan for smoking cessation Avoid known COPD "triggers" and activities that may cause overexertion. Contact provider or care management team with new questions  and concerns   Follow Up Plan: Will follow-up in two months      Patient Care Plan: Hypertension (Adult)     Problem Identified: Hypertension (Hypertension)      Long-Range Goal: Hypertension Monitored   Start Date: 05/21/2021  Expected End Date: 09/18/2021  Priority: High  Note:    Objective:  Current Barriers:  Chronic Disease Management needs and support r/t Hypertension self management.  Case Manager Clinical Goal(s):  Over the next 120 days, patient will demonstrate improved adherence to prescribed treatment plan for hypertension management as evidenced by taking all medications as prescribed, monitoring and recording blood pressure and adhering to a heart healthy/cardiac prudent diet.  Interventions:  Collaboration with Crystal White., MD regarding development and update of comprehensive plan of care as evidenced by provider attestation and co-signature Inter-disciplinary care team collaboration (see longitudinal plan of care) Discussed compliance with current treatment plan. Reviewed established BP parameters. Reports excellent compliance with medications and monitoring her BP as recommended. Reports home readings have ranged in the low 140's over 70's. Continues to monitor sodium intake. Reports doing well with maintaining a heart healthy diet. Reviewed s/sx of heart attack, stroke and worsening symptoms that require immediate medical attention.  Patient Goals/Self-Care Activities Self administer medications as prescribed Consistently monitor BP and record readings. Follow recommended heart healthy/cardiac prudent diet Contact provider or care management team with new questions and concerns as needed   Follow Up Plan:  Will follow up in two months      Crystal White verbalized understanding of the information discussed during the telephonic outreach. Declined need for mailed/printed instructions. A member of the care management team will follow up within the next two months.   Crystal White [facility] Care Management [facility] [phone]

## 2021-07-14 ENCOUNTER — Other Ambulatory Visit: Payer: Self-pay | Admitting: Family Medicine

## 2021-07-14 DIAGNOSIS — E7800 Pure hypercholesterolemia, unspecified: Secondary | ICD-10-CM

## 2021-07-15 NOTE — Telephone Encounter (Signed)
Requested medication (s) are due for refill today: yes  Requested medication (s) are on the active medication list: yes   Last refill:  05/20/2021  Future visit scheduled: no  Notes to clinic:  overdue for 6 month follow up Message sent for patient to contact office    Requested Prescriptions  Pending Prescriptions Disp Refills   traZODone (DESYREL) 150 MG tablet [Pharmacy Med Name: traZODone HCl 150 MG Oral Tablet] 90 tablet 3    Sig: TAKE 1 TABLET BY MOUTH AT  BEDTIME     Psychiatry: Antidepressants - Serotonin Modulator Failed - 07/14/2021 11:18 PM      Failed - Valid encounter within last 6 months    Recent Outpatient Visits           6 months ago Pedal edema   [facility] Jerrol Banana., MD   8 months ago Essential (primary) hypertension   [place], [place], PA-C   8 months ago Essential (primary) hypertension   [facility], Vickki Muff, PA-C   11 months ago Annual physical exam   [facility] Jerrol Banana., MD   1 year ago Essential (primary) hypertension   [facility] Jerrol Banana., MD              Passed - Completed PHQ-2 or PHQ-9 in the last 360 days       hydrochlorothiazide (HYDRODIURIL) 25 MG tablet [Pharmacy Med Name: hydroCHLOROthiazide 25 MG Oral Tablet] 90 tablet 3    Sig: TAKE 1 TABLET BY MOUTH  DAILY     Cardiovascular: Diuretics - Thiazide Failed - 07/14/2021 11:18 PM      Failed - Cr in normal range and within 360 days    Creatinine  Date Value Ref Range Status  12/24/2012 1.36 (H) 0.60 - 1.30 mg/dL Final   Creatinine, Ser  Date Value Ref Range Status  11/11/2020 1.83 (H) 0.44 - 1.00 mg/dL Final          Failed - Last BP in normal range    BP Readings from Last 1 Encounters:  04/02/21 (!) 170/84          Failed - Valid encounter within last 6 months    Recent Outpatient Visits           6 months ago Pedal edema    [facility] Jerrol Banana., MD   8 months ago Essential (primary) hypertension   [facility] [place], [place], PA-C   8 months ago Essential (primary) hypertension   [facility], Vickki Muff, PA-C   11 months ago Annual physical exam   [facility] Jerrol Banana., MD   1 year ago Essential (primary) hypertension   [facility] Jerrol Banana., MD              Passed - Ca in normal range and within 360 days    Calcium  Date Value Ref Range Status  11/11/2020 8.9 8.9 - 10.3 mg/dL Final   Calcium, Total  Date Value Ref Range Status  12/24/2012 9.6 8.5 - 10.1 mg/dL Final          Passed - K in normal range and within 360 days    Potassium  Date Value Ref Range Status  11/11/2020 4.1 3.5 - 5.1 mmol/L Final  12/24/2012 3.9 3.5 - 5.1 mmol/L Final  Passed - Na in normal range and within 360 days    Sodium  Date Value Ref Range Status  11/11/2020 141 135 - 145 mmol/L Final  08/11/2020 142 134 - 144 mmol/L Final  12/24/2012 141 136 - 145 mmol/L Final           citalopram (CELEXA) 10 MG tablet [Pharmacy Med Name: Citalopram Hydrobromide 10 MG Oral Tablet] 90 tablet 3    Sig: TAKE 1 TABLET BY MOUTH AT  BEDTIME     Psychiatry:  Antidepressants - SSRI Failed - 07/14/2021 11:18 PM      Failed - Valid encounter within last 6 months    Recent Outpatient Visits           6 months ago Pedal edema   [facility] Jerrol Banana., MD   8 months ago Essential (primary) hypertension   [place], [place], PA-C   8 months ago Essential (primary) hypertension   [facility], Vickki Muff, PA-C   11 months ago Annual physical exam   [facility] Jerrol Banana., MD   1 year ago Essential (primary) hypertension   [facility] Jerrol Banana., MD               Passed - Completed PHQ-2 or PHQ-9 in the last 360 days       ezetimibe (ZETIA) 10 MG tablet [Pharmacy Med Name: Ezetimibe 10 MG Oral Tablet] 90 tablet 3    Sig: TAKE 1 TABLET BY MOUTH  DAILY     Cardiovascular:  Antilipid - Sterol Transport Inhibitors Failed - 07/14/2021 11:18 PM      Failed - Total Cholesterol in normal range and within 360 days    Cholesterol, Total  Date Value Ref Range Status  08/11/2020 232 (H) 100 - 199 mg/dL Final   Cholesterol  Date Value Ref Range Status  12/24/2012 230 (H) 0 - 200 mg/dL Final          Failed - LDL in normal range and within 360 days    Ldl Cholesterol, Calc  Date Value Ref Range Status  12/24/2012 132 (H) 0 - 100 mg/dL Final   LDL Chol Calc (NIH)  Date Value Ref Range Status  08/11/2020 148 (H) 0 - 99 mg/dL Final          Failed - Triglycerides in normal range and within 360 days    Triglycerides  Date Value Ref Range Status  08/11/2020 180 (H) 0 - 149 mg/dL Final  12/24/2012 279 (H) 0 - 200 mg/dL Final          Passed - HDL in normal range and within 360 days    HDL Cholesterol  Date Value Ref Range Status  12/24/2012 42 40 - 60 mg/dL Final   HDL  Date Value Ref Range Status  08/11/2020 52 >39 mg/dL Final          Passed - Valid encounter within last 12 months    Recent Outpatient Visits           6 months ago Pedal edema   [facility] Jerrol Banana., MD   8 months ago Essential (primary) hypertension   [place], [place], PA-C   8 months ago Essential (primary) hypertension   [facility], Vickki Muff, PA-C   11 months ago Annual physical exam   [facility] Jerrol Banana., MD  1 year ago Essential (primary) hypertension   [facility] Jerrol Banana., MD               pantoprazole (PROTONIX) 40 MG tablet [Pharmacy Med Name: Pantoprazole Sodium 40 MG Oral Tablet Delayed Release] 180 tablet 3     Sig: TAKE 1 TABLET BY MOUTH  TWICE DAILY     Gastroenterology: Proton Pump Inhibitors Passed - 07/14/2021 11:18 PM      Passed - Valid encounter within last 12 months    Recent Outpatient Visits           6 months ago Pedal edema   [facility] Jerrol Banana., MD   8 months ago Essential (primary) hypertension   [place], [place], PA-C   8 months ago Essential (primary) hypertension   [facility], Vickki Muff, PA-C   11 months ago Annual physical exam   [facility] Jerrol Banana., MD   1 year ago Essential (primary) hypertension   [facility] Jerrol Banana., MD

## 2021-07-28 DIAGNOSIS — I10 Essential (primary) hypertension: Secondary | ICD-10-CM | POA: Diagnosis not present

## 2021-07-28 DIAGNOSIS — J449 Chronic obstructive pulmonary disease, unspecified: Secondary | ICD-10-CM | POA: Diagnosis not present

## 2021-08-06 ENCOUNTER — Telehealth: Payer: Self-pay | Admitting: Pulmonary Disease

## 2021-08-06 ENCOUNTER — Telehealth: Payer: Self-pay

## 2021-08-06 MED ORDER — BREZTRI AEROSPHERE 160-9-4.8 MCG/ACT IN AERO
2.0000 | INHALATION_SPRAY | Freq: Two times a day (BID) | RESPIRATORY_TRACT | 11 refills | Status: DC
Start: 2021-08-06 — End: 2021-10-19

## 2021-08-06 NOTE — Telephone Encounter (Signed)
Application has been received and placed in Dr. Domingo Dimes folder for signature.

## 2021-08-06 NOTE — Progress Notes (Signed)
Chronic Care Management Pharmacy Assistant   Name: Crystal White  MRN: [id number] DOB: May 27, 1941  Patient Assistance Application for Breztri follow-up  09/14 '@1020'$  called [employer] to check the status of the patient assistance application for [facility]. Spoke with Hailey and she was not able to locate the patient's application. I informed her that it was faxed or should have been faxed on 09/09. After review it appears that the application may have been faxed back to [facility] instead of [employer]. Instead of delaying the patient's care any longer the representative informed me I could go onto the [employer] website and complete the application process there. I was able to complete the application on the website and the patient was enrolled on 08/11/2021 and End Enrollment is 11/27/2021 which she will need to submit a new application. Contacted Dr. Domingo Dimes office to inform that I do need someone to fax over the prescription to [employer] @ [phone] for the patient's Crystal White so they can process the order.   I received a task from [name], CPP regarding the patient assistance application status for patient's Breztri. [name], CPP faxed the application over to the patient's Pulmonologist Office Vernard Gambles, MD and he requested that I contact the office  @ [phone] to see if they received the application and if so did they fax if over to [employer] @ [phone] for processing and if not to request the fax the signed application back to [facility] so [name] can fax it to [employer].  I reached out to the office and spoke with Apolonio Schneiders, but the number I have was to the [place] office. She was able to assist me and message the [place] office and send a message over to the nurse. She did take my direct number and stated that someone would give me a call back to let me know the status of if they faxed it to [facility] or if they faxed it to [employer]. I did provider her with the [employer] fax number  just in case they wanted to go ahead and fax it directly but requested a call to inform me so I will know when I can contact [employer] to check the status.   '@1048'$  Marjorie from Dr. Patsey Berthold office called me back and advised that they never received the [facility] application. She requested that it be faxed again to [phone]. I sent a message to CPP requesting that he please refax this application back over to Dr. Patsey Berthold office today.   '@1124'$  [name], CPP responded to my message and confirmed that he faxed over the patient assistant application for patient's Breztri so Dr. Patsey Berthold can sign. Contacted Dr. Domingo Dimes office to confirm they received the fax. I spoke with the front desk staff and she advised that they have received the fax for the patient's application and that it has been placed in the providers office for her signature and since the provider is in today it should be signed today. The representative also informed me that they would fax the application over to [employer] once it has been signed.  I sent message to CPP informing him of the update and advising I would contact [employer] next week to check the status of the patient's application.  Medications: Outpatient Encounter Medications as of 08/06/2021  Medication Sig Note   acetaminophen (TYLENOL) 500 MG tablet Take 1,000 mg by mouth every 6 (six) hours as needed (pain/headaches.).     albuterol (PROVENTIL HFA) 108 (90 Base) MCG/ACT inhaler Inhale  2 puffs into the lungs every 6 (six) hours as needed for wheezing or shortness of breath.    Budeson-Glycopyrrol-Formoterol (BREZTRI AEROSPHERE) 160-9-4.8 MCG/ACT AERO Inhale 2 puffs into the lungs in the morning and at bedtime.    citalopram (CELEXA) 10 MG tablet Take 1 tablet (10 mg total) by mouth at bedtime. Please schedule an office visit before anymore refills.    ezetimibe (ZETIA) 10 MG tablet Take 1 tablet (10 mg total) by mouth daily. Please schedule an office visit before anymore refills.     gabapentin (NEURONTIN) 100 MG capsule TAKE 2 CAPSULES BY MOUTH 3  TIMES DAILY    hydrochlorothiazide (HYDRODIURIL) 25 MG tablet Take 1 tablet (25 mg total) by mouth daily. Please schedule an office visit before anymore refills.    losartan (COZAAR) 100 MG tablet TAKE 1 TABLET(100 MG) BY MOUTH DAILY    meclizine (ANTIVERT) 25 MG tablet Take 1 tablet (25 mg total) by mouth 2 (two) times daily as needed for dizziness. (Patient not taking: Reported on 06/21/2021) 01/24/2017: As needed   mirtazapine (REMERON) 30 MG tablet TAKE 1 TABLET BY MOUTH AT  BEDTIME    OXYGEN Inhale into the lungs daily. Uses at night with CPAP and PRN during daytime    pantoprazole (PROTONIX) 40 MG tablet Take 1 tablet (40 mg total) by mouth 2 (two) times daily. Please schedule an office visit before anymore refills.    Tavaborole (KERYDIN) 5 % SOLN Apply to toenails and fingernails at bedtime daily    traZODone (DESYREL) 150 MG tablet Take 1 tablet (150 mg total) by mouth at bedtime. Please schedule an office visit before anymore refills.    No facility-administered encounter medications on file as of 08/06/2021.   Lynann Bologna, CPA/CMA Clinical Pharmacist Assistant Phone: [phone]

## 2021-08-06 NOTE — Telephone Encounter (Signed)
App has been completed and faxed back to [facility]. Received fax confirmation.  Nothing further needed.

## 2021-08-06 NOTE — Telephone Encounter (Signed)
It does not appear that we did not received application.  I have spoken to [facility] and requested that she refax forms.  Will await fax.

## 2021-08-11 ENCOUNTER — Telehealth: Payer: Self-pay | Admitting: Pulmonary Disease

## 2021-08-11 ENCOUNTER — Telehealth: Payer: Self-pay

## 2021-08-11 NOTE — Progress Notes (Signed)
Chronic Care Management Pharmacy Assistant   Name: Crystal White  MRN: [id number] DOB: [birth year]  Reason for Encounter: COPD Disease State Call   Recent office visits:  None ID  Recent consult visits:  07/21/2021 Halford Decamp, PA (Cardiology) for 4 month follow-up-  No medication changes noted; patient instructed to return in 4 months  Hospital visits:  None in previous 6 months  Medications: Outpatient Encounter Medications as of 08/11/2021  Medication Sig Note   acetaminophen (TYLENOL) 500 MG tablet Take 1,000 mg by mouth every 6 (six) hours as needed (pain/headaches.).     albuterol (PROVENTIL HFA) 108 (90 Base) MCG/ACT inhaler Inhale 2 puffs into the lungs every 6 (six) hours as needed for wheezing or shortness of breath.    Budeson-Glycopyrrol-Formoterol (BREZTRI AEROSPHERE) 160-9-4.8 MCG/ACT AERO Inhale 2 puffs into the lungs in the morning and at bedtime.    Budeson-Glycopyrrol-Formoterol (BREZTRI AEROSPHERE) 160-9-4.8 MCG/ACT AERO Inhale 2 puffs into the lungs in the morning and at bedtime.    citalopram (CELEXA) 10 MG tablet Take 1 tablet (10 mg total) by mouth at bedtime. Please schedule an office visit before anymore refills.    ezetimibe (ZETIA) 10 MG tablet Take 1 tablet (10 mg total) by mouth daily. Please schedule an office visit before anymore refills.    gabapentin (NEURONTIN) 100 MG capsule TAKE 2 CAPSULES BY MOUTH 3  TIMES DAILY    hydrochlorothiazide (HYDRODIURIL) 25 MG tablet Take 1 tablet (25 mg total) by mouth daily. Please schedule an office visit before anymore refills.    losartan (COZAAR) 100 MG tablet TAKE 1 TABLET(100 MG) BY MOUTH DAILY    meclizine (ANTIVERT) 25 MG tablet Take 1 tablet (25 mg total) by mouth 2 (two) times daily as needed for dizziness. (Patient not taking: Reported on 06/21/2021) 01/24/2017: As needed   mirtazapine (REMERON) 30 MG tablet TAKE 1 TABLET BY MOUTH AT  BEDTIME    OXYGEN Inhale into the lungs daily. Uses at night with CPAP  and PRN during daytime    pantoprazole (PROTONIX) 40 MG tablet Take 1 tablet (40 mg total) by mouth 2 (two) times daily. Please schedule an office visit before anymore refills.    Tavaborole (KERYDIN) 5 % SOLN Apply to toenails and fingernails at bedtime daily    traZODone (DESYREL) 150 MG tablet Take 1 tablet (150 mg total) by mouth at bedtime. Please schedule an office visit before anymore refills.    No facility-administered encounter medications on file as of 08/11/2021.   Care Gaps: Hepatitis C Screening Influenza Vaccine  Zoster Vaccines Blood Pressure greater than 140/90 last BP noted in chart was 08/24 at her Cardio appointment 170/92  Star Rating Drugs: Losartan 100 mg last filled on 05/28/2021 for a 90-Day supply with [place]  Current COPD regimen:  Breztri Inhale 2 puffs twice daily (PAP sent for this medication) Albuterol 108 2 puffs q6h prn  No flowsheet data found.  Any recent hospitalizations or ED visits since last visit with CPP? No  Reports COPD symptoms, including Increased shortness of breath  and Symptoms worse with exercise patient reports that she was having a few symptoms earlier but after taking a puff of her Crystal White she is feeling better. She stated that she was in the kitchen and went to the bathroom and after walking back to the kitchen her O2 dropped to about 80. She stated that her Oxygen normally drops with too much movement. She reports that after resting her O2 is back  up to normal.   What recent interventions/DTPs have been made by any provider to improve breathing since last visit:  None ID  Have you had exacerbation/flare-up since last visit? No  What do you do when you are short of breath?  Adhere to COPD Action Plan, Rescue medication, and Rest. Patient reports that she uses her rescue inhaler about 2-3 times weekly. She does have a nebulizer machine that she uses as well. She reports that she mainly uses her nebulizer when she has a  cold. I did educate her and advised that if she is short of breath and her Albuterol inhaler doesn't seem to be working fast enough that she can also use her nebulizer as well in order to gain better control of her symptoms. Patient stated she never thought about that and will do that next time she is in a crisis to where the inhaler is not working fast enough.    Respiratory Devices/Equipment Do you have a nebulizer? Yes Do you use a Peak Flow Meter? Yes Do you use a maintenance inhaler? Yes How often do you forget to use your daily inhaler? Never Do you use a rescue inhaler? Yes How often do you use your rescue inhaler?   2-3 times weekly Do you use a spacer with your inhaler? No  Patient was also advised that her application for patient assistance with her Crystal White was approved and that I contacted Dr. Domingo Dimes  office to request that the fax over the prescription to [employer] so they can complete the processing of the patient's application so the patient can start receiving her medication. Patient did report that she does have a sample left that she is using at this time so hopefully everything get's processed and sent out to her in a timely manner before that sample is complete. Patient also advised that if [employer] contacts her regarding her shipment if she can just give me a call and let me know that they have reported to her that they are shipping out her medication. I informed patient if I don't hear back from her by next week I will check the status of the application to see if I can get any shipment information or just make sure [employer] has everything they need in order to ship. Patient verbalized understanding to all.   Adherence Review: Does the patient have >5 day gap between last estimated fill date for maintenance inhaler medications? No   Patient has an upcoming appointment with [name], CPP on 09/21/2021 @ [place], CPA/CMA Catering manager Phone:  [phone]

## 2021-08-11 NOTE — Telephone Encounter (Signed)
Lm for Crystal White.

## 2021-08-12 MED ORDER — BREZTRI AEROSPHERE 160-9-4.8 MCG/ACT IN AERO: 2.0000 | INHALATION_SPRAY | Freq: Two times a day (BID) | RESPIRATORY_TRACT | 11 refills | Status: DC

## 2021-08-12 NOTE — Telephone Encounter (Signed)
Rx printed and placed in Dr. Domingo Dimes folder for signature.  Crystal White has been made aware of this via staff message.

## 2021-08-13 ENCOUNTER — Ambulatory Visit (INDEPENDENT_AMBULATORY_CARE_PROVIDER_SITE_OTHER): Payer: Medicare Other

## 2021-08-13 DIAGNOSIS — J449 Chronic obstructive pulmonary disease, unspecified: Secondary | ICD-10-CM

## 2021-08-13 NOTE — Chronic Care Management (AMB) (Signed)
Chronic Care Management   CCM RN Visit Note  08/13/2021 Name: Crystal White MRN: [id number] DOB: 27-Jun-1941  Subjective: Crystal White is a 80 y.o. year old female who is a primary care patient of Jerrol Banana., MD. The care management team was consulted for assistance with disease management and care coordination needs.    Engaged with patient by telephone for follow up visit in response to provider referral for case management and care coordination services.   Consent to Services:  The patient was given information about Chronic Care Management services, agreed to services, and gave verbal consent prior to initiation of services.  Please see initial visit note for detailed documentation.    Assessment: Review of patient past medical history, allergies, medications, health status, including review of consultants reports, laboratory and other test data, was performed as part of comprehensive evaluation and provision of chronic care management services.   SDOH (Social Determinants of Health) assessments and interventions performed: No  CCM Care Plan  Allergies  Allergen Reactions   Iodinated Diagnostic Agents Other (See Comments)    Burning sensation during contrast media injections. Patient states the IV contrast makes her very hot. She has had multiple CT Scans with IV contrast and states she only gets the warm sensation. This is not an allergic reaction, but is noted in her [facility] chart as well. Aggie Hacker, ARRT R CT, had this discussion with her on 01/10/19 at 1130am.   Bacitracin Swelling and Rash   Statins Other (See Comments) and Rash    Reaction:  Leg cramps     Outpatient Encounter Medications as of 08/13/2021  Medication Sig Note   acetaminophen (TYLENOL) 500 MG tablet Take 1,000 mg by mouth every 6 (six) hours as needed (pain/headaches.).     albuterol (PROVENTIL HFA) 108 (90 Base) MCG/ACT inhaler Inhale 2 puffs into the lungs every 6 (six) hours as  needed for wheezing or shortness of breath.    Budeson-Glycopyrrol-Formoterol (BREZTRI AEROSPHERE) 160-9-4.8 MCG/ACT AERO Inhale 2 puffs into the lungs in the morning and at bedtime.    Budeson-Glycopyrrol-Formoterol (BREZTRI AEROSPHERE) 160-9-4.8 MCG/ACT AERO Inhale 2 puffs into the lungs in the morning and at bedtime.    citalopram (CELEXA) 10 MG tablet Take 1 tablet (10 mg total) by mouth at bedtime. Please schedule an office visit before anymore refills.    ezetimibe (ZETIA) 10 MG tablet Take 1 tablet (10 mg total) by mouth daily. Please schedule an office visit before anymore refills.    gabapentin (NEURONTIN) 100 MG capsule TAKE 2 CAPSULES BY MOUTH 3  TIMES DAILY    hydrochlorothiazide (HYDRODIURIL) 25 MG tablet Take 1 tablet (25 mg total) by mouth daily. Please schedule an office visit before anymore refills.    losartan (COZAAR) 100 MG tablet TAKE 1 TABLET(100 MG) BY MOUTH DAILY    meclizine (ANTIVERT) 25 MG tablet Take 1 tablet (25 mg total) by mouth 2 (two) times daily as needed for dizziness. (Patient not taking: Reported on 06/21/2021) 01/24/2017: As needed   mirtazapine (REMERON) 30 MG tablet TAKE 1 TABLET BY MOUTH AT  BEDTIME    OXYGEN Inhale into the lungs daily. Uses at night with CPAP and PRN during daytime    pantoprazole (PROTONIX) 40 MG tablet Take 1 tablet (40 mg total) by mouth 2 (two) times daily. Please schedule an office visit before anymore refills.    Tavaborole (KERYDIN) 5 % SOLN Apply to toenails and fingernails at bedtime daily    traZODone (DESYREL)  150 MG tablet Take 1 tablet (150 mg total) by mouth at bedtime. Please schedule an office visit before anymore refills.    No facility-administered encounter medications on file as of 08/13/2021.    Patient Active Problem List   Diagnosis Date Noted   Incisional hernia, without obstruction or gangrene 06/03/2016   Osteoporosis 02/22/2016   Musculoskeletal chest pain 07/14/2015   Chronic respiratory failure ([place])  07/14/2015   Acute bronchitis 07/14/2015   Other emphysema ([place]) 07/14/2015   Chronic renal insufficiency 07/14/2015   Chest pain 07/13/2015   Multiple pulmonary nodules 06/02/2015   Colon polyp 05/29/2015   Hemoptysis 04/29/2015   Sprain of ankle 04/14/2015   Anxiety 04/14/2015   Barrett's esophagus 04/14/2015   Acute exacerbation of chronic obstructive airways disease ([place]) 04/14/2015   Bursitis 04/14/2015   Cellulitis 04/14/2015   Claudication ([place]) 04/14/2015   Cough 04/14/2015   Deficiency, 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   Clinical depression 04/14/2015   Dizziness 04/14/2015   Dyslipidemia 04/14/2015   Essential (primary) hypertension 04/14/2015   Flank pain 04/14/2015   H/O suicide attempt 04/14/2015   Dysphonia 04/14/2015   Hypercholesteremia 04/14/2015   Cannot sleep 04/14/2015   Malaise and fatigue 04/14/2015   Mild cognitive impairment 04/14/2015   Cramp in muscle 04/14/2015   Muscle ache 04/14/2015   Neuropathy 04/14/2015   Adiposity 04/14/2015   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   Candida infection of mouth 04/14/2015   Abnormal loss of weight 04/14/2015   Decreased body weight 04/14/2015   Artificial cardiac pacemaker 10/08/2014   Apnea, sleep 10/08/2014   COPD (chronic obstructive pulmonary disease) ([place]) 02/13/2012   Tobacco abuse 02/13/2012   Sinus congestion 02/13/2012   Current tobacco use 02/13/2012   Esophagitis, reflux 08/28/2006   Abdominal pain, right lower quadrant 08/22/2005   Acute appendicitis with peritoneal abscess 08/22/2005    Conditions to be addressed/monitored:COPD Patient Care Plan: COPD (Adult)     Problem Identified: Symptom Exacerbation (COPD)      Long-Range Goal: Symptom Exacerbation Prevented or Minimized   Start Date: 05/21/2021  Expected End Date: 09/18/2021  Priority: High  Note:   Current Barriers:  Chronic Disease Management needs and support r/t COPD self-management  Case Manager Clinical Goal(s): Over the  next 120 days, patient will not require hospitalization or emergent care d/t complications r/t COPD exacerbation.   Interventions:  Collaboration with Jerrol Banana., MD regarding development and update of comprehensive plan of care as evidenced by provider attestation and co-signature Inter-disciplinary care team collaboration (see longitudinal plan of care) Reviewed medications and compliance with treatment plan. Reports taking medications as prescribed. Reports symptoms have been well controlled and activity tolerance has improved. Continues to use supplemental oxygen via CPAP at 3L/min. Advised to continue assessing symptoms r/t COPD daily. Advised to notify a provider for evaluation if symptoms persist for 48 hours without improvement.  Reviewed worsening s/sx that require immediate medical attention. Update on 08/13/21: Discussed need for assistance with Breztri. Reports completing needed documentation for medication assistance and receiving a call from the Pulmonology Clinic regarding need for a PCP signature. Requested that the care management team follow up with the Pulmonology team to confirm all needed documents were received. Agreed to call if additional assistance is required.   Patient Self Care Activities:  Self administer medications and utilize inhalers as prescribed Monitor symptoms and follow recommended COPD Action plan Consider plan for smoking cessation Avoid known COPD "triggers" and activities that may cause overexertion. Update the care management team  if unable to obtain [facility] provider or care management team with new questions  and concerns   Follow Up Plan: Will follow-up next month       PLAN A member of the care management team will follow up next month.   [name] [facility] Care Management [facility] [phone]

## 2021-08-13 NOTE — Patient Instructions (Addendum)
Thank you for allowing the Chronic Care Management team to participate in your care.   Goals Addressed: Patient Care Plan: COPD (Adult)     Problem Identified: Symptom Exacerbation (COPD)      Long-Range Goal: Symptom Exacerbation Prevented or Minimized   Start Date: 05/21/2021  Expected End Date: 09/18/2021  Priority: High  Note:   Current Barriers:  Chronic Disease Management needs and support r/t COPD self-management  Case Manager Clinical Goal(s): Over the next 120 days, patient will not require hospitalization or emergent care d/t complications r/t COPD exacerbation.   Interventions:  Collaboration with Jerrol Banana., MD regarding development and update of comprehensive plan of care as evidenced by provider attestation and co-signature Inter-disciplinary care team collaboration (see longitudinal plan of care) Reviewed medications and compliance with treatment plan. Reports taking medications as prescribed. Reports symptoms have been well controlled and activity tolerance has improved. Continues to use supplemental oxygen via CPAP at 3L/min. Advised to continue assessing symptoms r/t COPD daily. Advised to notify a provider for evaluation if symptoms persist for 48 hours without improvement.  Reviewed worsening s/sx that require immediate medical attention. Update on 08/13/21: Discussed need for assistance with Breztri. Reports completing needed documentation for medication assistance and receiving a call from the Pulmonology Clinic regarding need for a PCP signature. Requested that the care management team follow up with the Pulmonology team to confirm all needed documents were received. Agreed to call if additional assistance is required.   Patient Self Care Activities:  Self administer medications and utilize inhalers as prescribed Monitor symptoms and follow recommended COPD Action plan Consider plan for smoking cessation Avoid known COPD "triggers" and activities that  may cause overexertion. Update the care management team if unable to obtain [facility] provider or care management team with new questions  and concerns   Follow Up Plan: Will follow-up next month       Ms. Kadrmas verbalized understanding of the information discussed during the telephonic outreach. Declined need for mailed/printed instructions. A member of the care management team will follow up next month.   [name] [facility] Care Management [facility] [phone]

## 2021-08-19 NOTE — Telephone Encounter (Signed)
Rx is in Dr. Domingo Dimes folder for signature.

## 2021-08-19 NOTE — Telephone Encounter (Signed)
Margie, do you know if this has been signed yet?

## 2021-08-24 ENCOUNTER — Other Ambulatory Visit: Payer: Self-pay | Admitting: Family Medicine

## 2021-08-24 DIAGNOSIS — I10 Essential (primary) hypertension: Secondary | ICD-10-CM

## 2021-08-25 NOTE — Telephone Encounter (Signed)
Requested medications are due for refill today.  yes  Requested medications are on the active medications list.  yes  Last refill. 06/03/2021  Future visit scheduled.   With pharmacy  Notes to clinic.  Pt is more than 3 months overdue for OV. Labs are expired.

## 2021-08-25 NOTE — Telephone Encounter (Signed)
Requested medications are due for refill today.  yes  Requested medications are on the active medications list.  yes  Last refill. 05/28/2021  Future visit scheduled.   With pharmacy  Notes to clinic.  Pt is more than 3 months overdue for OV. Labs are expired.

## 2021-08-26 ENCOUNTER — Ambulatory Visit: Payer: Self-pay

## 2021-08-26 DIAGNOSIS — I10 Essential (primary) hypertension: Secondary | ICD-10-CM

## 2021-08-26 DIAGNOSIS — J441 Chronic obstructive pulmonary disease with (acute) exacerbation: Secondary | ICD-10-CM

## 2021-08-26 NOTE — Chronic Care Management (AMB) (Signed)
Chronic Care Management   CCM RN Visit Note  08/26/2021 Name: Crystal White MRN: [id number] DOB: 1941/08/01  Subjective: Crystal White is a 80 y.o. year old female who is a primary care patient of Jerrol Banana., MD. The care management team was consulted for assistance with disease management and care coordination needs.    Engaged with patient by telephone for follow up visit in response to provider referral for case management and care coordination services.   Consent to Services:  The patient was given information about Chronic Care Management services, agreed to services, and gave verbal consent prior to initiation of services.  Please see initial visit note for detailed documentation.   Assessment: Review of patient past medical history, allergies, medications, health status, including review of consultants reports, laboratory and other test data, was performed as part of comprehensive evaluation and provision of chronic care management services.   SDOH (Social Determinants of Health) assessments and interventions performed:  No  CCM Care Plan  Allergies  Allergen Reactions   Iodinated Diagnostic Agents Other (See Comments)    Burning sensation during contrast media injections. Patient states the IV contrast makes her very hot. She has had multiple CT Scans with IV contrast and states she only gets the warm sensation. This is not an allergic reaction, but is noted in her [place] chart as well. Aggie Hacker, ARRT R CT, had this discussion with her on 01/10/19 at 1130am.   Bacitracin Swelling and Rash   Statins Other (See Comments) and Rash    Reaction:  Leg cramps     Outpatient Encounter Medications as of 08/26/2021  Medication Sig Note   losartan (COZAAR) 100 MG tablet TAKE 1 TABLET(100 MG) BY MOUTH DAILY    acetaminophen (TYLENOL) 500 MG tablet Take 1,000 mg by mouth every 6 (six) hours as needed (pain/headaches.).     albuterol (PROVENTIL HFA) 108 (90 Base)  MCG/ACT inhaler Inhale 2 puffs into the lungs every 6 (six) hours as needed for wheezing or shortness of breath.    Budeson-Glycopyrrol-Formoterol (BREZTRI AEROSPHERE) 160-9-4.8 MCG/ACT AERO Inhale 2 puffs into the lungs in the morning and at bedtime.    Budeson-Glycopyrrol-Formoterol (BREZTRI AEROSPHERE) 160-9-4.8 MCG/ACT AERO Inhale 2 puffs into the lungs in the morning and at bedtime.    citalopram (CELEXA) 10 MG tablet Take 1 tablet (10 mg total) by mouth at bedtime. Please schedule an office visit before anymore refills.    ezetimibe (ZETIA) 10 MG tablet Take 1 tablet (10 mg total) by mouth daily. Please schedule an office visit before anymore refills.    gabapentin (NEURONTIN) 100 MG capsule TAKE 2 CAPSULES BY MOUTH 3  TIMES DAILY    hydrochlorothiazide (HYDRODIURIL) 25 MG tablet Take 1 tablet (25 mg total) by mouth daily. Please schedule an office visit before anymore refills.    meclizine (ANTIVERT) 25 MG tablet Take 1 tablet (25 mg total) by mouth 2 (two) times daily as needed for dizziness. (Patient not taking: Reported on 06/21/2021) 01/24/2017: As needed   mirtazapine (REMERON) 30 MG tablet TAKE 1 TABLET BY MOUTH AT  BEDTIME    OXYGEN Inhale into the lungs daily. Uses at night with CPAP and PRN during daytime    pantoprazole (PROTONIX) 40 MG tablet Take 1 tablet (40 mg total) by mouth 2 (two) times daily. Please schedule an office visit before anymore refills.    Tavaborole (KERYDIN) 5 % SOLN Apply to toenails and fingernails at bedtime daily    traZODone (DESYREL)  150 MG tablet Take 1 tablet (150 mg total) by mouth at bedtime. Please schedule an office visit before anymore refills.    No facility-administered encounter medications on file as of 08/26/2021.    Patient Active Problem List   Diagnosis Date Noted   Incisional hernia, without obstruction or gangrene 06/03/2016   Osteoporosis 02/22/2016   Musculoskeletal chest pain 07/14/2015   Chronic respiratory failure ([place]) 07/14/2015    Acute bronchitis 07/14/2015   Other emphysema ([place]) 07/14/2015   Chronic renal insufficiency 07/14/2015   Chest pain 07/13/2015   Multiple pulmonary nodules 06/02/2015   Colon polyp 05/29/2015   Hemoptysis 04/29/2015   Sprain of ankle 04/14/2015   Anxiety 04/14/2015   Barrett's esophagus 04/14/2015   Acute exacerbation of chronic obstructive airways disease ([place]) 04/14/2015   Bursitis 04/14/2015   Cellulitis 04/14/2015   Claudication ([place]) 04/14/2015   Cough 04/14/2015   Deficiency, 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   Clinical depression 04/14/2015   Dizziness 04/14/2015   Dyslipidemia 04/14/2015   Essential (primary) hypertension 04/14/2015   Flank pain 04/14/2015   H/O suicide attempt 04/14/2015   Dysphonia 04/14/2015   Hypercholesteremia 04/14/2015   Cannot sleep 04/14/2015   Malaise and fatigue 04/14/2015   Mild cognitive impairment 04/14/2015   Cramp in muscle 04/14/2015   Muscle ache 04/14/2015   Neuropathy 04/14/2015   Adiposity 04/14/2015   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   Candida infection of mouth 04/14/2015   Abnormal loss of weight 04/14/2015   Decreased body weight 04/14/2015   Artificial cardiac pacemaker 10/08/2014   Apnea, sleep 10/08/2014   COPD (chronic obstructive pulmonary disease) ([place]) 02/13/2012   Tobacco abuse 02/13/2012   Sinus congestion 02/13/2012   Current tobacco use 02/13/2012   Esophagitis, reflux 08/28/2006   Abdominal pain, right lower quadrant 08/22/2005   Acute appendicitis with peritoneal abscess 08/22/2005    Conditions to be addressed/monitored:COPD  Patient Care Plan: COPD (Adult)     Problem Identified: Symptom Exacerbation (COPD)      Long-Range Goal: Symptom Exacerbation Prevented or Minimized   Start Date: 05/21/2021  Expected End Date: 09/18/2021  Priority: High  Note:   Current Barriers:  Chronic Disease Management needs and support r/t COPD self-management  Case Manager Clinical Goal(s): Over the next 120  days, patient will not require hospitalization or emergent care d/t complications r/t COPD exacerbation.   Interventions:  Collaboration with Jerrol Banana., MD regarding development and update of comprehensive plan of care as evidenced by provider attestation and co-signature Inter-disciplinary care team collaboration (see longitudinal plan of care) Reviewed medications and compliance with treatment plan. Reports taking medications as prescribed. Reports symptoms have been well controlled and activity tolerance has improved. Continues to use supplemental oxygen via CPAP at 3L/min. Advised to continue assessing symptoms r/t COPD daily. Advised to notify a provider for evaluation if symptoms persist for 48 hours without improvement.  Reviewed worsening s/sx that require immediate medical attention. Update on 08/26/21: Patient called regarding documentation required for [facility]. Discussed information as indicated in chart. Per chart review the Pulmonology team would forward the needed document/order to [place] [facility]. Reports having enough Breztri until th end of the month. Agreed to call the Pulmonology team or contact the care management team if additional assistance is required.   Patient Self Care Activities:  Self administer medications and utilize inhalers as prescribed Monitor symptoms and follow recommended COPD Action plan Consider plan for smoking cessation Avoid known COPD "triggers" and activities that may cause overexertion. Update the care  management team if unable to obtain [facility] provider or care management team with new questions  and concerns   Follow Up Plan: Will follow-up next month       PLAN A member of the care management team will follow up next month.   [name] [facility] Care Management [facility] [phone]

## 2021-08-26 NOTE — Patient Instructions (Addendum)
Thank you for allowing the Chronic Care Management team to participate in your care.    Patient Care Plan: COPD (Adult)     Problem Identified: Symptom Exacerbation (COPD)      Long-Range Goal: Symptom Exacerbation Prevented or Minimized   Start Date: 05/21/2021  Expected End Date: 09/18/2021  Priority: High  Note:   Current Barriers:  Chronic Disease Management needs and support r/t COPD self-management  Case Manager Clinical Goal(s): Over the next 120 days, patient will not require hospitalization or emergent care d/t complications r/t COPD exacerbation.   Interventions:  Collaboration with Jerrol Banana., MD regarding development and update of comprehensive plan of care as evidenced by provider attestation and co-signature Inter-disciplinary care team collaboration (see longitudinal plan of care) Reviewed medications and compliance with treatment plan. Reports taking medications as prescribed. Reports symptoms have been well controlled and activity tolerance has improved. Continues to use supplemental oxygen via CPAP at 3L/min. Advised to continue assessing symptoms r/t COPD daily. Advised to notify a provider for evaluation if symptoms persist for 48 hours without improvement.  Reviewed worsening s/sx that require immediate medical attention. Update on 08/26/21: Patient called regarding documentation required for [facility]. Discussed information as indicated in chart. Per chart review the Pulmonology team would forward the needed document/order to [facility]. Reports having enough Breztri until th end of the month. Agreed to call the Pulmonology team or contact the care management team if additional assistance is required.   Patient Self Care Activities:  Self administer medications and utilize inhalers as prescribed Monitor symptoms and follow recommended COPD Action plan Consider plan for smoking cessation Avoid known COPD "triggers" and activities that may cause  overexertion. Update the care management team if unable to obtain [facility] provider or care management team with new questions  and concerns   Follow Up Plan: Will follow-up next month        Mrs. Speece verbalized understanding of the information discussed during the telephonic outreach. Declined need for mailed/printed instructions. A member of the care management team will follow up next month.   [name] [facility] Care Management [facility] [phone]

## 2021-08-26 NOTE — Telephone Encounter (Signed)
Rx has been signed and faxed to [facility]

## 2021-08-27 DIAGNOSIS — J441 Chronic obstructive pulmonary disease with (acute) exacerbation: Secondary | ICD-10-CM | POA: Diagnosis not present

## 2021-08-27 DIAGNOSIS — J449 Chronic obstructive pulmonary disease, unspecified: Secondary | ICD-10-CM | POA: Diagnosis not present

## 2021-08-30 NOTE — Telephone Encounter (Signed)
Please advise refill?  LOV: 11/12/2020 NOV: None Last refill: 06/03/2021 #90 w/0 refills

## 2021-09-02 ENCOUNTER — Ambulatory Visit (INDEPENDENT_AMBULATORY_CARE_PROVIDER_SITE_OTHER): Payer: Medicare Other

## 2021-09-02 ENCOUNTER — Ambulatory Visit: Payer: Self-pay

## 2021-09-02 DIAGNOSIS — J449 Chronic obstructive pulmonary disease, unspecified: Secondary | ICD-10-CM

## 2021-09-02 NOTE — Chronic Care Management (AMB) (Signed)
Error. Disregard

## 2021-09-03 ENCOUNTER — Ambulatory Visit: Payer: Medicare Other

## 2021-09-03 DIAGNOSIS — J449 Chronic obstructive pulmonary disease, unspecified: Secondary | ICD-10-CM

## 2021-09-03 NOTE — Chronic Care Management (AMB) (Signed)
Chronic Care Management   CCM RN Visit Note  09/03/2021 Name: Crystal White MRN: [id number] DOB: May 10, 1941  Subjective: Crystal White is a 80 y.o. year old female who is a primary care patient of Jerrol Banana., MD. The care management team was consulted for assistance with disease management and care coordination needs.    Engaged with patient by telephone for follow up visit in response to provider referral for case management and care coordination services.   Consent to Services:  The patient was given information about Chronic Care Management services, agreed to services, and gave verbal consent prior to initiation of services.  Please see initial visit note for detailed documentation.   Assessment: Review of patient past medical history, allergies, medications, health status, including review of consultants reports, laboratory and other test data, was performed as part of comprehensive evaluation and provision of chronic care management services.   SDOH (Social Determinants of Health) assessments and interventions performed:  No  CCM Care Plan  Allergies  Allergen Reactions   Iodinated Diagnostic Agents Other (See Comments)    Burning sensation during contrast media injections. Patient states the IV contrast makes her very hot. She has had multiple CT Scans with IV contrast and states she only gets the warm sensation. This is not an allergic reaction, but is noted in her [facility] chart as well. Aggie Hacker, ARRT R CT, had this discussion with her on 01/10/19 at 1130am.   Bacitracin Swelling and Rash   Statins Other (See Comments) and Rash    Reaction:  Leg cramps     Outpatient Encounter Medications as of 09/03/2021  Medication Sig Note   losartan (COZAAR) 100 MG tablet TAKE 1 TABLET(100 MG) BY MOUTH DAILY    mirtazapine (REMERON) 30 MG tablet TAKE 1 TABLET BY MOUTH AT  BEDTIME    acetaminophen (TYLENOL) 500 MG tablet Take 1,000 mg by mouth every 6 (six) hours  as needed (pain/headaches.).     albuterol (PROVENTIL HFA) 108 (90 Base) MCG/ACT inhaler Inhale 2 puffs into the lungs every 6 (six) hours as needed for wheezing or shortness of breath.    Budeson-Glycopyrrol-Formoterol (BREZTRI AEROSPHERE) 160-9-4.8 MCG/ACT AERO Inhale 2 puffs into the lungs in the morning and at bedtime.    Budeson-Glycopyrrol-Formoterol (BREZTRI AEROSPHERE) 160-9-4.8 MCG/ACT AERO Inhale 2 puffs into the lungs in the morning and at bedtime.    citalopram (CELEXA) 10 MG tablet Take 1 tablet (10 mg total) by mouth at bedtime. Please schedule an office visit before anymore refills.    ezetimibe (ZETIA) 10 MG tablet Take 1 tablet (10 mg total) by mouth daily. Please schedule an office visit before anymore refills.    gabapentin (NEURONTIN) 100 MG capsule TAKE 2 CAPSULES BY MOUTH 3  TIMES DAILY    hydrochlorothiazide (HYDRODIURIL) 25 MG tablet Take 1 tablet (25 mg total) by mouth daily. Please schedule an office visit before anymore refills.    meclizine (ANTIVERT) 25 MG tablet Take 1 tablet (25 mg total) by mouth 2 (two) times daily as needed for dizziness. (Patient not taking: Reported on 06/21/2021) 01/24/2017: As needed   OXYGEN Inhale into the lungs daily. Uses at night with CPAP and PRN during daytime    pantoprazole (PROTONIX) 40 MG tablet Take 1 tablet (40 mg total) by mouth 2 (two) times daily. Please schedule an office visit before anymore refills.    Tavaborole (KERYDIN) 5 % SOLN Apply to toenails and fingernails at bedtime daily    traZODone (DESYREL)  150 MG tablet Take 1 tablet (150 mg total) by mouth at bedtime. Please schedule an office visit before anymore refills.    No facility-administered encounter medications on file as of 09/03/2021.    Patient Active Problem List   Diagnosis Date Noted   Incisional hernia, without obstruction or gangrene 06/03/2016   Osteoporosis 02/22/2016   Musculoskeletal chest pain 07/14/2015   Chronic respiratory failure ([place]) 07/14/2015    Acute bronchitis 07/14/2015   Other emphysema ([place]) 07/14/2015   Chronic renal insufficiency 07/14/2015   Chest pain 07/13/2015   Multiple pulmonary nodules 06/02/2015   Colon polyp 05/29/2015   Hemoptysis 04/29/2015   Sprain of ankle 04/14/2015   Anxiety 04/14/2015   Barrett's esophagus 04/14/2015   Acute exacerbation of chronic obstructive airways disease ([place]) 04/14/2015   Bursitis 04/14/2015   Cellulitis 04/14/2015   Claudication ([place]) 04/14/2015   Cough 04/14/2015   Deficiency, 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   Clinical depression 04/14/2015   Dizziness 04/14/2015   Dyslipidemia 04/14/2015   Essential (primary) hypertension 04/14/2015   Flank pain 04/14/2015   H/O suicide attempt 04/14/2015   Dysphonia 04/14/2015   Hypercholesteremia 04/14/2015   Cannot sleep 04/14/2015   Malaise and fatigue 04/14/2015   Mild cognitive impairment 04/14/2015   Cramp in muscle 04/14/2015   Muscle ache 04/14/2015   Neuropathy 04/14/2015   Adiposity 04/14/2015   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   Candida infection of mouth 04/14/2015   Abnormal loss of weight 04/14/2015   Decreased body weight 04/14/2015   Artificial cardiac pacemaker 10/08/2014   Apnea, sleep 10/08/2014   COPD (chronic obstructive pulmonary disease) ([place]) 02/13/2012   Tobacco abuse 02/13/2012   Sinus congestion 02/13/2012   Current tobacco use 02/13/2012   Esophagitis, reflux 08/28/2006   Abdominal pain, right lower quadrant 08/22/2005   Acute appendicitis with peritoneal abscess 08/22/2005    Conditions to be addressed/monitored:COPD Patient Care Plan: COPD (Adult)     Problem Identified: Symptom Exacerbation (COPD)      Long-Range Goal: Symptom Exacerbation Prevented or Minimized   Start Date: 05/21/2021  Expected End Date: 09/18/2021  Priority: High  Note:   Current Barriers:  Chronic Disease Management needs and support r/t COPD self-management  Case Manager Clinical Goal(s): Over the next 120  days, patient will not require hospitalization or emergent care d/t complications r/t COPD exacerbation.   Interventions:  Collaboration with Jerrol Banana., MD regarding development and update of comprehensive plan of care as evidenced by provider attestation and co-signature Inter-disciplinary care team collaboration (see longitudinal plan of care) Reviewed medications and compliance with treatment plan. Reports taking medications as prescribed. She will run out of Breztri inhaler within the week if mail supply is not received. Pending follow up from [facility] and Me regarding anticipated timeframe for receiving her Breztri inhalers. We discussed need to contact the Pulmonology clinic to determine if samples are available in the event the inhalers are not received. Continues to use supplemental oxygen via CPAP at 3L/min. Advised to continue assessing symptoms r/t COPD daily. Advised to notify a provider for evaluation if symptoms persist for 48 hours without improvement.  Reviewed worsening s/sx that require immediate medical attention.    Patient Self Care Activities:  Self administer medications and utilize inhalers as prescribed Monitor symptoms and follow recommended COPD Action plan Consider plan for smoking cessation Avoid known COPD "triggers" and activities that may cause overexertion. Update the care management team if unable to obtain [facility] provider or care management team with new questions  and concerns         PLAN Will follow up regarding medications within the next week. Will follow up to update care plan next month.   [name] Care Management [facility] [phone]

## 2021-09-03 NOTE — Patient Instructions (Addendum)
Thank you for allowing the Chronic Care Management team to participate in your care.    Patient Care Plan: COPD (Adult)     Problem Identified: Symptom Exacerbation (COPD)      Long-Range Goal: Symptom Exacerbation Prevented or Minimized   Start Date: 05/21/2021  Expected End Date: 09/18/2021  Priority: High  Note:   Current Barriers:  Chronic Disease Management needs and support r/t COPD self-management  Case Manager Clinical Goal(s): Over the next 120 days, patient will not require hospitalization or emergent care d/t complications r/t COPD exacerbation.   Interventions:  Collaboration with Jerrol Banana., MD regarding development and update of comprehensive plan of care as evidenced by provider attestation and co-signature Inter-disciplinary care team collaboration (see longitudinal plan of care) Reviewed medications and compliance with treatment plan. Reports taking medications as prescribed. She will run out of Breztri inhaler within the week if mail supply is not received. Pending follow up from [facility] and Me regarding anticipated timeframe for receiving her Breztri inhalers. We discussed need to contact the Pulmonology clinic to determine if samples are available in the event the inhalers are not received. Continues to use supplemental oxygen via CPAP at 3L/min. Advised to continue assessing symptoms r/t COPD daily. Advised to notify a provider for evaluation if symptoms persist for 48 hours without improvement.  Reviewed worsening s/sx that require immediate medical attention.    Patient Self Care Activities:  Self administer medications and utilize inhalers as prescribed Monitor symptoms and follow recommended COPD Action plan Consider plan for smoking cessation Avoid known COPD "triggers" and activities that may cause overexertion. Update the care management team if unable to obtain [facility] provider or care management team with new questions  and  concerns         Ms. Gunner verbalized understanding of the information discussed during the telephonic outreach. Declined need for mailed/printed instructions.  Will follow up regarding medications within the next week. Will follow up to update care plan next month.   [name] Care Management [facility] [phone]

## 2021-09-06 ENCOUNTER — Encounter: Payer: Self-pay | Admitting: Emergency Medicine

## 2021-09-06 ENCOUNTER — Ambulatory Visit: Payer: Self-pay | Admitting: *Deleted

## 2021-09-06 ENCOUNTER — Other Ambulatory Visit: Payer: Self-pay

## 2021-09-06 ENCOUNTER — Ambulatory Visit (INDEPENDENT_AMBULATORY_CARE_PROVIDER_SITE_OTHER): Payer: Medicare Other

## 2021-09-06 ENCOUNTER — Ambulatory Visit
Admission: EM | Admit: 2021-09-06 | Discharge: 2021-09-06 | Disposition: A | Discharge status: Home / Self Care | Payer: Medicare Other | Attending: Internal Medicine | Admitting: Internal Medicine

## 2021-09-06 DIAGNOSIS — R0602 Shortness of breath: Secondary | ICD-10-CM

## 2021-09-06 DIAGNOSIS — Z950 Presence of cardiac pacemaker: Secondary | ICD-10-CM | POA: Insufficient documentation

## 2021-09-06 DIAGNOSIS — J069 Acute upper respiratory infection, unspecified: Secondary | ICD-10-CM

## 2021-09-06 HISTORY — DX: Presence of cardiac pacemaker: Z95.0

## 2021-09-06 MED ORDER — ALBUTEROL SULFATE (2.5 MG/3ML) 0.083% IN NEBU: 2.5000 mg | INHALATION_SOLUTION | RESPIRATORY_TRACT | 0 refills | Status: DC | PRN

## 2021-09-06 MED ORDER — BENZONATATE 200 MG PO CAPS: 200.0000 mg | ORAL_CAPSULE | Freq: Two times a day (BID) | ORAL | 0 refills | Status: DC | PRN

## 2021-09-06 MED ORDER — FEXOFENADINE HCL 180 MG PO TABS: 180.0000 mg | ORAL_TABLET | Freq: Every day | ORAL | 0 refills | Status: DC

## 2021-09-06 MED ORDER — GUAIFENESIN-CODEINE 200-8 MG/5ML PO LIQD: ORAL | 0 refills | Status: DC

## 2021-09-06 NOTE — Discharge Instructions (Addendum)
Follow up with your family Dr this week

## 2021-09-06 NOTE — Telephone Encounter (Signed)
Constant cough keeping patient up all night/day. Dry and non productive. On O2@4L  with Pox 90% while resting and drops to 80's while up. Denies CP. Using rescue inhaler Q6h.Advised ED/UC at this time.

## 2021-09-06 NOTE — ED Triage Notes (Signed)
Pt presents today along with daughter with c/o dry cough that began last Thursday. Denies fever. She reports hx of COPD and wears oxygen at home intermittently. She called PCP today, suggested she come to UC/ED for chest xray. She used Albuterol inhaler pta. No nebulizer tx pta.

## 2021-09-06 NOTE — ED Provider Notes (Signed)
[facility]    CSN: [id number] Arrival date & time: 09/06/21  0948      History   Chief Complaint Chief Complaint  Patient presents with   Cough    HPI Crystal White is a 80 y.o. female who presents with her daughter due to onset of non productive cough x 4 days. Denies fever. Has hx of COPD and uses O2 at home intermittently. Her PCP advised her to come today and get CXR. Has been using her Albuterol inhaler and daily COPD inhaler. She has has been having clear rhinitis. Per her daughter pt caughed all night. Pt states deep breaths provokes her cough. Yesterday she was feeling SOB even while wearing her Oxygen. Her normal O2 without O2 is 88%. Normal with O2 is 90%. She used her albuterol inhaler before coming here today.     Past Medical History:  Diagnosis Date   Arthritis    hands, lower back   Barrett's esophagus    COPD (chronic obstructive pulmonary disease) (HCC)    Emphysema lung (HCC)    GERD (gastroesophageal reflux disease)    Hernia of abdominal cavity    Hyperlipidemia    Hypertension    Mobitz type II atrioventricular block    OSA (obstructive sleep apnea)    on CPAP   Pacemaker    Peripheral neuropathy    Presence of permanent cardiac pacemaker 09/22/2011   Biotronik - EVIADR-T [facility])   Shortness of breath dyspnea    Stomach ulcer     Patient Active Problem List   Diagnosis Date Noted   Pacemaker 09/06/2021   Incisional hernia, without obstruction or gangrene 06/03/2016   Osteoporosis 02/22/2016   Musculoskeletal chest pain 07/14/2015   Chronic respiratory failure ([place]) 07/14/2015   Acute bronchitis 07/14/2015   Other emphysema ([place]) 07/14/2015   Chronic renal insufficiency 07/14/2015   Chest pain 07/13/2015   Multiple pulmonary nodules 06/02/2015   Colon polyp 05/29/2015   Hemoptysis 04/29/2015   Sprain of ankle 04/14/2015   Anxiety 04/14/2015   Barrett's esophagus 04/14/2015   Acute exacerbation of chronic obstructive  airways disease ([place]) 04/14/2015   Bursitis 04/14/2015   Cellulitis 04/14/2015   Claudication ([place]) 04/14/2015   Cough 04/14/2015   Deficiency, 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   Clinical depression 04/14/2015   Dizziness 04/14/2015   Dyslipidemia 04/14/2015   Essential (primary) hypertension 04/14/2015   Flank pain 04/14/2015   H/O suicide attempt 04/14/2015   Dysphonia 04/14/2015   Hypercholesteremia 04/14/2015   Cannot sleep 04/14/2015   Malaise and fatigue 04/14/2015   Mild cognitive impairment 04/14/2015   Cramp in muscle 04/14/2015   Muscle ache 04/14/2015   Neuropathy 04/14/2015   Adiposity 04/14/2015   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   Candida infection of mouth 04/14/2015   Abnormal loss of weight 04/14/2015   Decreased body weight 04/14/2015   Artificial cardiac pacemaker 10/08/2014   Apnea, sleep 10/08/2014   COPD (chronic obstructive pulmonary disease) ([place]) 02/13/2012   Tobacco abuse 02/13/2012   Sinus congestion 02/13/2012   Current tobacco use 02/13/2012   Esophagitis, reflux 08/28/2006   Abdominal pain, right lower quadrant 08/22/2005   Acute appendicitis with peritoneal abscess 08/22/2005    Past Surgical History:  Procedure Laterality Date   APPENDECTOMY  2006   Dr. Gae Dry LIFT Bilateral 05/03/2016   Procedure: BLEPHAROPLASTY  BILATERAL UPPER EYELIDS WITH EXCESS SKIN REMOVAL BILATERAL BROW PTOSIS REPAIR BLEPHAROPTOSIS REPAIR BILATERAL EYES ;  Surgeon: Karle Starch, MD;  Location:  [place];  Service: Ophthalmology;  Laterality: Bilateral;   CARDIAC CATHETERIZATION  2007   COLON SURGERY     COLONOSCOPY WITH PROPOFOL N/A 04/14/2015   Procedure: COLONOSCOPY WITH PROPOFOL;  Surgeon: Lollie Sails, MD;  Location: [facility] ENDOSCOPY;  Service: Endoscopy;  Laterality: N/A;   COLONOSCOPY WITH PROPOFOL N/A 01/24/2019   Procedure: COLONOSCOPY WITH PROPOFOL;  Surgeon: Lollie Sails, MD;  Location: [facility] ENDOSCOPY;  Service: Endoscopy;  Laterality:  N/A;   COLONOSCOPY WITH PROPOFOL N/A 06/11/2019   Procedure: COLONOSCOPY WITH PROPOFOL;  Surgeon: Lollie Sails, MD;  Location: [facility] ENDOSCOPY;  Service: Endoscopy;  Laterality: N/A;   COLONOSCOPY WITH PROPOFOL N/A 11/23/2020   Procedure: COLONOSCOPY WITH PROPOFOL;  Surgeon: Lesly Rubenstein, MD;  Location: [facility] ENDOSCOPY;  Service: Endoscopy;  Laterality: N/A;   ESOPHAGOGASTRODUODENOSCOPY N/A 04/14/2015   Procedure: ESOPHAGOGASTRODUODENOSCOPY (EGD);  Surgeon: Lollie Sails, MD;  Location: [facility] ENDOSCOPY;  Service: Endoscopy;  Laterality: N/A;   ESOPHAGOGASTRODUODENOSCOPY (EGD) WITH PROPOFOL N/A 07/05/2017   Procedure: ESOPHAGOGASTRODUODENOSCOPY (EGD) WITH PROPOFOL;  Surgeon: Manya Silvas, MD;  Location: [facility] ENDOSCOPY;  Service: Endoscopy;  Laterality: N/A;   ESOPHAGOGASTRODUODENOSCOPY (EGD) WITH PROPOFOL N/A 01/24/2019   Procedure: ESOPHAGOGASTRODUODENOSCOPY (EGD) WITH PROPOFOL;  Surgeon: Lollie Sails, MD;  Location: [facility] ENDOSCOPY;  Service: Endoscopy;  Laterality: N/A;   ESOPHAGOGASTRODUODENOSCOPY (EGD) WITH PROPOFOL N/A 11/23/2020   Procedure: ESOPHAGOGASTRODUODENOSCOPY (EGD) WITH PROPOFOL;  Surgeon: Lesly Rubenstein, MD;  Location: [facility] ENDOSCOPY;  Service: Endoscopy;  Laterality: N/A;   HERNIA REPAIR     HIATAL HERNIA REPAIR  2007   INSERT / REPLACE / REMOVE PACEMAKER     LAPAROSCOPIC RIGHT HEMI COLECTOMY Right 06/22/2015   Procedure: LAPAROSCOPIC RIGHT HEMI COLECTOMY;  Surgeon: Marlyce Huge, MD;  Location: [facility] ORS;  Service: General;  Laterality: Right;   NISSEN FUNDOPLICATION  5701   OPEN REDUCTION INTERNAL FIXATION (ORIF) DISTAL RADIAL FRACTURE Left 08/22/2019   Procedure: OPEN REDUCTION INTERNAL FIXATION (ORIF) DISTAL RADIAL FRACTURE;  Surgeon: Hessie Knows, MD;  Location: [facility] ORS;  Service: Orthopedics;  Laterality: Left;   PACEMAKER INSERTION  09/22/11   [facility]  - [employer]ron[employer] EVIADR-T   PPM GENERATOR CHANGEOUT N/A 02/02/2021   Procedure: PPM GENERATOR  CHANGEOUT;  Surgeon: Isaias Cowman, MD;  Location: [place] CV LAB;  Service: Cardiovascular;  Laterality: N/A;   TOTAL ABDOMINAL HYSTERECTOMY  1980    OB History   No obstetric history on file.      Home Medications    Prior to Admission medications   Medication Sig Start Date End Date Taking? Authorizing Provider  albuterol (PROVENTIL HFA) 108 (90 Base) MCG/ACT inhaler Inhale 2 puffs into the lungs every 6 (six) hours as needed for wheezing or shortness of breath. 06/21/21  Yes Jerrol Banana., MD  albuterol (PROVENTIL) (2.5 MG/3ML) 0.083% nebulizer solution Take 3 mLs (2.5 mg total) by nebulization every 4 (four) hours as needed for wheezing or shortness of breath. 09/06/21  Yes Rodriguez-Southworth, Sunday Spillers, PA-C  benzonatate (TESSALON) 200 MG capsule Take 1 capsule (200 mg total) by mouth 2 (two) times daily as needed for cough. For day time cough 09/06/21  Yes Rodriguez-Southworth, Sandrea Matte  fexofenadine [facility] ALLERGY) 180 MG tablet Take 1 tablet (180 mg total) by mouth daily. For rhinitis 09/06/21  Yes Rodriguez-Southworth, Sandrea Matte  guaiFENesin-Codeine 200-8 MG/5ML LIQD 1 tsp qhs prn cough 09/06/21  Yes Rodriguez-Southworth, Sunday Spillers, PA-C  losartan (COZAAR) 100 MG tablet TAKE 1 TABLET(100 MG) BY MOUTH DAILY 08/26/21   Rosanna Randy,  Retia Passe., MD  mirtazapine (REMERON) 30 MG tablet TAKE 1 TABLET BY MOUTH AT  BEDTIME 08/30/21   Jerrol Banana., MD  acetaminophen (TYLENOL) 500 MG tablet Take 1,000 mg by mouth every 6 (six) hours as needed (pain/headaches.).     [provider]  Budeson-Glycopyrrol-Formoterol (BREZTRI AEROSPHERE) 160-9-4.8 MCG/ACT AERO Inhale 2 puffs into the lungs in the morning and at bedtime. 08/06/21   Tyler Pita, MD  citalopram (CELEXA) 10 MG tablet Take 1 tablet (10 mg total) by mouth at bedtime. Please schedule an office visit before anymore refills. 07/19/21   Jerrol Banana., MD  ezetimibe (ZETIA) 10 MG tablet  Take 1 tablet (10 mg total) by mouth daily. Please schedule an office visit before anymore refills. 07/19/21   Jerrol Banana., MD  gabapentin (NEURONTIN) 100 MG capsule TAKE 2 CAPSULES BY MOUTH 3  TIMES DAILY 03/10/21   Jerrol Banana., MD  hydrochlorothiazide (HYDRODIURIL) 25 MG tablet Take 1 tablet (25 mg total) by mouth daily. Please schedule an office visit before anymore refills. 07/19/21   Jerrol Banana., MD  OXYGEN Inhale into the lungs daily. Uses at night with CPAP and PRN during daytime    [provider]  pantoprazole (PROTONIX) 40 MG tablet Take 1 tablet (40 mg total) by mouth 2 (two) times daily. Please schedule an office visit before anymore refills. 07/19/21   Jerrol Banana., MD  Tavaborole [facility]) 5 % SOLN Apply to toenails and fingernails at bedtime daily 06/14/21   Ralene Bathe, MD  traZODone (DESYREL) 150 MG tablet Take 1 tablet (150 mg total) by mouth at bedtime. Please schedule an office visit before anymore refills. 07/19/21   Jerrol Banana., MD    Family History Family History  Adopted: Yes  Problem Relation Age of Onset   Diabetes Son     Social History Social History   Tobacco Use   Smoking status: Every Day    Packs/day: 1.00    Years: 55.00    Pack years: 55.00    Types: Cigarettes   Smokeless tobacco: Never   Tobacco comments:    Currently smoking a pack/day  Vaping Use   Vaping Use: Never used  Substance Use Topics   Alcohol use: Yes    Alcohol/week: 0.0 standard drinks    Comment: 1/2 glass wine -rare   Drug use: No     Allergies   Iodinated diagnostic agents, Bacitracin, and Statins   Review of Systems Review of Systems  Constitutional:  Positive for chills, diaphoresis and fatigue. Negative for appetite change and fever.  HENT:  Positive for rhinorrhea. Negative for congestion, ear discharge, ear pain, sore throat and trouble swallowing.   Eyes:  Negative for discharge.  Respiratory:   Positive for cough and wheezing. Negative for chest tightness and shortness of breath.   Cardiovascular:  Negative for chest pain.  Musculoskeletal:  Negative for myalgias.  Skin:  Negative for rash.  Neurological:  Negative for headaches.  Hematological:  Negative for adenopathy.    Physical Exam Triage Vital Signs ED Triage Vitals  Enc Vitals Group     BP 09/06/21 1015 (!) 162/96     Pulse Rate 09/06/21 1015 83     Resp 09/06/21 1015 (!) 30     Temp 09/06/21 1015 98.8 F (37.1 C)     Temp Source 09/06/21 1015 Oral     SpO2 09/06/21 1015 93 %  Weight --      Height --      Head Circumference --      Peak Flow --      Pain Score 09/06/21 1016 0     Pain Loc --      Pain Edu? --      Excl. in [place]? --    No data found.  Updated Vital Signs BP (!) 162/96 (BP Location: Left Arm)   Pulse 83   Temp 98.8 F (37.1 C) (Oral)   Resp (!) 30   SpO2 93%   Visual Acuity Right Eye Distance:   Left Eye Distance:   Bilateral Distance:    Right Eye Near:   Left Eye Near:    Bilateral Near:     Physical Exam Physical Exam Vitals signs and nursing note reviewed.  Constitutional:      General: She is not in acute distress.    Appearance: Normal appearance. She is not ill-appearing, toxic-appearing or diaphoretic.  HENT:     Head: Normocephalic.     Right Ear: Tympanic membrane, ear canal and external ear normal.     Left Ear: Tympanic membrane, ear canal and external ear normal.     Nose: with clear mucous     Mouth/Throat:     Mouth: Mucous membranes are moist.  Eyes:     General: No scleral icterus.       Right eye: No discharge.        Left eye: No discharge.     Conjunctiva/sclera: Conjunctivae normal.  Neck:     Musculoskeletal: Neck supple. No neck rigidity.  Cardiovascular:     Rate and Rhythm: Normal rate and regular rhythm.     Heart sounds: No murmur. No edema Pulmonary:     Effort: Pulmonary effort is normal.     Breath sounds: has decreased breath  sounds all over.  Musculoskeletal: Normal range of motion.  Lymphadenopathy:     Cervical: No cervical adenopathy.  Skin:    General: Skin is warm and dry.     Coloration: Skin is not jaundiced.     Findings: No rash.  Neurological:     Mental Status: She is alert and oriented to person, place, and time.     Gait: Gait normal.  Psychiatric:        Mood and Affect: Mood normal.        Behavior: Behavior normal.        Thought Content: Thought content normal.        Judgment: Judgment normal.    UC Treatments / Results  Labs (all labs ordered are listed, but only abnormal results are displayed) Labs Reviewed - No data to display  EKG   Radiology DG Chest 2 View  Result Date: 09/06/2021 CLINICAL DATA:  Shortness of breath. EXAM: CHEST - 2 VIEW COMPARISON:  01/23/2020 FINDINGS: Lungs are hyperexpanded. The lungs are clear without focal pneumonia, edema, pneumothorax or pleural effusion. The cardiopericardial silhouette is within normal limits for size. The visualized bony structures of the thorax show no acute abnormality. Left permanent pacemaker again noted. IMPRESSION: Hyperexpansion without acute cardiopulmonary findings. Electronically Signed   By: Misty Stanley M.D.   On: 09/06/2021 10:59    Procedures Procedures (including critical care time)  Medications Ordered in UC Medications - No data to display  Initial Impression / Assessment and Plan / UC Course  I have reviewed the triage vital signs and the nursing notes. URI. She has a  neb machine, so I advised her to use it q 4h while she has the cough til better, then prn after that. I sent albuterol ampule's for her machine.   I placed her on Allegra and Robitussin CA   Final Clinical Impressions(s) / UC Diagnoses   Final diagnoses:  Viral URI with cough     Discharge Instructions      Follow up with your family Dr this week     ED Prescriptions     Medication Sig Dispense Auth. Provider   albuterol  (PROVENTIL) (2.5 MG/3ML) 0.083% nebulizer solution Take 3 mLs (2.5 mg total) by nebulization every 4 (four) hours as needed for wheezing or shortness of breath. 75 mL Rodriguez-Southworth, Sunday Spillers, PA-C   fexofenadine (ALLEGRA ALLERGY) 180 MG tablet Take 1 tablet (180 mg total) by mouth daily. For rhinitis 30 tablet Rodriguez-Southworth, Sunday Spillers, PA-C   guaiFENesin-Codeine 200-8 MG/5ML LIQD 1 tsp qhs prn cough 120 mL Rodriguez-Southworth, Eder Macek, PA-C   benzonatate (TESSALON) 200 MG capsule Take 1 capsule (200 mg total) by mouth 2 (two) times daily as needed for cough. For day time cough 30 capsule Rodriguez-Southworth, Sunday Spillers, PA-C      PDMP not reviewed this encounter.   Shelby Mattocks, PA-C 09/06/21 1334

## 2021-09-06 NOTE — Chronic Care Management (AMB) (Signed)
Chronic Care Management   CCM RN Visit Note   Name: Crystal White MRN: [id number] DOB: Apr 10, 1941  Subjective: Crystal White is a 80 y.o. year old female who is a primary care patient of Jerrol Banana., MD. The care management team was consulted for assistance with disease management and care coordination needs.    Engaged with patient by telephone for follow up visit in response to provider referral for case management and care coordination services.   Consent to Services:  The patient was given information about Chronic Care Management services, agreed to services, and gave verbal consent prior to initiation of services.  Please see initial visit note for detailed documentation.   Assessment: Review of patient past medical history, allergies, medications, health status, including review of consultants reports, laboratory and other test data, was performed as part of comprehensive evaluation and provision of chronic care management services.   SDOH (Social Determinants of Health) assessments and interventions performed: No   Outpatient Encounter Medications as of 09/02/2021  Medication Sig Note   losartan (COZAAR) 100 MG tablet TAKE 1 TABLET(100 MG) BY MOUTH DAILY    mirtazapine (REMERON) 30 MG tablet TAKE 1 TABLET BY MOUTH AT  BEDTIME    acetaminophen (TYLENOL) 500 MG tablet Take 1,000 mg by mouth every 6 (six) hours as needed (pain/headaches.).     albuterol (PROVENTIL HFA) 108 (90 Base) MCG/ACT inhaler Inhale 2 puffs into the lungs every 6 (six) hours as needed for wheezing or shortness of breath.    Budeson-Glycopyrrol-Formoterol (BREZTRI AEROSPHERE) 160-9-4.8 MCG/ACT AERO Inhale 2 puffs into the lungs in the morning and at bedtime.    Budeson-Glycopyrrol-Formoterol (BREZTRI AEROSPHERE) 160-9-4.8 MCG/ACT AERO Inhale 2 puffs into the lungs in the morning and at bedtime.    citalopram (CELEXA) 10 MG tablet Take 1 tablet (10 mg total) by mouth at bedtime. Please schedule an  office visit before anymore refills.    ezetimibe (ZETIA) 10 MG tablet Take 1 tablet (10 mg total) by mouth daily. Please schedule an office visit before anymore refills.    gabapentin (NEURONTIN) 100 MG capsule TAKE 2 CAPSULES BY MOUTH 3  TIMES DAILY    hydrochlorothiazide (HYDRODIURIL) 25 MG tablet Take 1 tablet (25 mg total) by mouth daily. Please schedule an office visit before anymore refills.    meclizine (ANTIVERT) 25 MG tablet Take 1 tablet (25 mg total) by mouth 2 (two) times daily as needed for dizziness. (Patient not taking: Reported on 06/21/2021) 01/24/2017: As needed   OXYGEN Inhale into the lungs daily. Uses at night with CPAP and PRN during daytime    pantoprazole (PROTONIX) 40 MG tablet Take 1 tablet (40 mg total) by mouth 2 (two) times daily. Please schedule an office visit before anymore refills.    Tavaborole (KERYDIN) 5 % SOLN Apply to toenails and fingernails at bedtime daily    traZODone (DESYREL) 150 MG tablet Take 1 tablet (150 mg total) by mouth at bedtime. Please schedule an office visit before anymore refills.    No facility-administered encounter medications on file as of 09/02/2021.     Ms. Kegg called regarding status of Breztri. Reports not receiving feedback from [facility] regarding anticipated date of medication delivery. Per chart review, required documentation was submitted and faxed. Reports current inhaler will last through the weekend. She will require a new inhaler next week. Will coordinate with the Pulmonology team and follow up within the next week.  PLAN A member of the care management team  will follow up within the next week.    [name] Care Management [facility] [phone]

## 2021-09-06 NOTE — Telephone Encounter (Signed)
Reason for Disposition  [1] MODERATE difficulty breathing (e.g., speaks in phrases, SOB even at rest, pulse 100-120) AND [2] still present when not coughing  Answer Assessment - Initial Assessment Questions 1. ONSET: "When did the cough begin?"      Several days 2. SEVERITY: "How bad is the cough today?"      Deep, dry 3. SPUTUM: "Describe the color of your sputum" (none, dry cough; clear, white, yellow, green)     none 4. HEMOPTYSIS: "Are you coughing up any blood?" If so ask: "How much?" (flecks, streaks, tablespoons, etc.)     no 5. DIFFICULTY BREATHING: "Are you having difficulty breathing?" If Yes, ask: "How bad is it?" (e.g., mild, moderate, severe)    - MILD: No SOB at rest, mild SOB with walking, speaks normally in sentences, can lie down, no retractions, pulse < 100.    - MODERATE: SOB at rest, SOB with minimal exertion and prefers to sit, cannot lie down flat, speaks in phrases, mild retractions, audible wheezing, pulse 100-120.    - SEVERE: Very SOB at rest, speaks in single words, struggling to breathe, sitting hunched forward, retractions, pulse > 120     severe 6. FEVER: "Do you have a fever?" If Yes, ask: "What is your temperature, how was it measured, and when did it start?"     none 7. CARDIAC HISTORY: "Do you have any history of heart disease?" (e.g., heart attack, congestive heart failure)      yes 8. LUNG HISTORY: "Do you have any history of lung disease?"  (e.g., pulmonary embolus, asthma, emphysema)     COPD 9. PE RISK FACTORS: "Do you have a history of blood clots?" (or: recent major surgery, recent prolonged travel, bedridden)     no 10. OTHER SYMPTOMS: "Do you have any other symptoms?" (e.g., runny nose, wheezing, chest pain)       no 11. PREGNANCY: "Is there any chance you are pregnant?" "When was your last menstrual period?"       na 12. TRAVEL: "Have you traveled out of the country in the last month?" (e.g., travel history, exposures)       no  Protocols  used: Cough - Acute Non-Productive-A-AH

## 2021-09-08 ENCOUNTER — Ambulatory Visit: Payer: Self-pay

## 2021-09-08 ENCOUNTER — Telehealth: Payer: Self-pay | Admitting: Family Medicine

## 2021-09-08 ENCOUNTER — Telehealth: Payer: Self-pay

## 2021-09-08 DIAGNOSIS — J441 Chronic obstructive pulmonary disease with (acute) exacerbation: Secondary | ICD-10-CM

## 2021-09-08 NOTE — Progress Notes (Signed)
Chronic Care Management Pharmacy Assistant   Name: Crystal White  MRN: [id number] DOB: 12-17-1940  Crystal White, CPP sent me a message asking if I can reach out to Dr. Rosanna White or the patient's pulmonary provider regarding a sample of Breztri. Crystal White is also requesting I contact Crystal White for the update on patient's PAP status for this medication.  Contacted Crystal White @ [phone], and spoke with the representative whom informed me that they have received the prescription for the patient. She also advised that the request for the patient prescription was submitted to the pharmacy for processing. The representative advised that it can take 2-10 business days for this shipment to process and patient to receive.   I reached out to Dr. Alben White office, spoke with Crystal White and requested patient be provided a sample of this medication until she can get her shipment. Crystal White advised that she would send a message to the provider with the request. She will have someone reach out to me or the patient if they can provide the sample. If the sample can not be provided I will reach out to the patient's pulmonary provider.  09/09/2021  Reached out to the patient today to inform her that she has been approved for Crystal White, and she should be receiving her first shipment in about 10 business days. I advised her that Dr. Rosanna White has approved for her to get one more sample of the Crystal White so there will be no delay in treatment, and she can pick the sample up at the office. Patient advised her shipment should be a 90-Day supply, but if it's not to contact me 14 days prior to her running out so I can contact Crystal White for refill. Patient verbalized understanding to all instructions.   Medications: Outpatient Encounter Medications as of 09/08/2021  Medication Sig   losartan (COZAAR) 100 MG tablet TAKE 1 TABLET(100 MG) BY MOUTH DAILY   mirtazapine (REMERON) 30 MG tablet TAKE 1 TABLET BY MOUTH AT  BEDTIME   acetaminophen (TYLENOL) 500  MG tablet Take 1,000 mg by mouth every 6 (six) hours as needed (pain/headaches.).    albuterol (PROVENTIL HFA) 108 (90 Base) MCG/ACT inhaler Inhale 2 puffs into the lungs every 6 (six) hours as needed for wheezing or shortness of breath.   albuterol (PROVENTIL) (2.5 MG/3ML) 0.083% nebulizer solution Take 3 mLs (2.5 mg total) by nebulization every 4 (four) hours as needed for wheezing or shortness of breath.   benzonatate (TESSALON) 200 MG capsule Take 1 capsule (200 mg total) by mouth 2 (two) times daily as needed for cough. For day time cough   Budeson-Glycopyrrol-Formoterol (BREZTRI AEROSPHERE) 160-9-4.8 MCG/ACT AERO Inhale 2 puffs into the lungs in the morning and at bedtime.   citalopram (CELEXA) 10 MG tablet Take 1 tablet (10 mg total) by mouth at bedtime. Please schedule an office visit before anymore refills.   ezetimibe (ZETIA) 10 MG tablet Take 1 tablet (10 mg total) by mouth daily. Please schedule an office visit before anymore refills.   fexofenadine (ALLEGRA ALLERGY) 180 MG tablet Take 1 tablet (180 mg total) by mouth daily. For rhinitis   gabapentin (NEURONTIN) 100 MG capsule TAKE 2 CAPSULES BY MOUTH 3  TIMES DAILY   guaiFENesin-Codeine 200-8 MG/5ML LIQD 1 tsp qhs prn cough   hydrochlorothiazide (HYDRODIURIL) 25 MG tablet Take 1 tablet (25 mg total) by mouth daily. Please schedule an office visit before anymore refills.   OXYGEN Inhale into the lungs daily. Uses at night with CPAP and PRN during  daytime   pantoprazole (PROTONIX) 40 MG tablet Take 1 tablet (40 mg total) by mouth 2 (two) times daily. Please schedule an office visit before anymore refills.   Tavaborole (KERYDIN) 5 % SOLN Apply to toenails and fingernails at bedtime daily   traZODone (DESYREL) 150 MG tablet Take 1 tablet (150 mg total) by mouth at bedtime. Please schedule an office visit before anymore refills.   No facility-administered encounter medications on file as of 09/08/2021.    Crystal White, CPA/CMA Clinical  Pharmacist Assistant Phone: [phone]

## 2021-09-08 NOTE — Chronic Care Management (AMB) (Signed)
Chronic Care Management   CCM RN Visit Note  09/08/2021 Name: SHANDRIKA AMBERS MRN: [id number] DOB: 14-Jan-1941  Subjective: Crystal White is a 80 y.o. year old female who is a primary care patient of Jerrol Banana., MD. The care management team was consulted for assistance with disease management and care coordination needs.    Engaged with patient by telephone for follow up visit in response to provider referral for case management and care coordination services.   Consent to Services:  The patient was given information about Chronic Care Management services, agreed to services, and gave verbal consent prior to initiation of services.  Please see initial visit note for detailed documentation.    Assessment: Review of patient past medical history, allergies, medications, health status, including review of consultants reports, laboratory and other test data, was performed as part of comprehensive evaluation and provision of chronic care management services.   SDOH (Social Determinants of Health) assessments and interventions performed:  No  CCM Care Plan  Allergies  Allergen Reactions   Iodinated Diagnostic Agents Other (See Comments)    Burning sensation during contrast media injections. Patient states the IV contrast makes her very hot. She has had multiple CT Scans with IV contrast and states she only gets the warm sensation. This is not an allergic reaction, but is noted in her [place] chart as well. Aggie Hacker, ARRT R CT, had this discussion with her on 01/10/19 at 1130am.   Bacitracin Swelling and Rash   Statins Other (See Comments) and Rash    Reaction:  Leg cramps     Outpatient Encounter Medications as of 09/08/2021  Medication Sig   losartan (COZAAR) 100 MG tablet TAKE 1 TABLET(100 MG) BY MOUTH DAILY   mirtazapine (REMERON) 30 MG tablet TAKE 1 TABLET BY MOUTH AT  BEDTIME   acetaminophen (TYLENOL) 500 MG tablet Take 1,000 mg by mouth every 6 (six) hours as  needed (pain/headaches.).    albuterol (PROVENTIL HFA) 108 (90 Base) MCG/ACT inhaler Inhale 2 puffs into the lungs every 6 (six) hours as needed for wheezing or shortness of breath.   albuterol (PROVENTIL) (2.5 MG/3ML) 0.083% nebulizer solution Take 3 mLs (2.5 mg total) by nebulization every 4 (four) hours as needed for wheezing or shortness of breath.   benzonatate (TESSALON) 200 MG capsule Take 1 capsule (200 mg total) by mouth 2 (two) times daily as needed for cough. For day time cough   Budeson-Glycopyrrol-Formoterol (BREZTRI AEROSPHERE) 160-9-4.8 MCG/ACT AERO Inhale 2 puffs into the lungs in the morning and at bedtime.   citalopram (CELEXA) 10 MG tablet Take 1 tablet (10 mg total) by mouth at bedtime. Please schedule an office visit before anymore refills.   ezetimibe (ZETIA) 10 MG tablet Take 1 tablet (10 mg total) by mouth daily. Please schedule an office visit before anymore refills.   fexofenadine (ALLEGRA ALLERGY) 180 MG tablet Take 1 tablet (180 mg total) by mouth daily. For rhinitis   gabapentin (NEURONTIN) 100 MG capsule TAKE 2 CAPSULES BY MOUTH 3  TIMES DAILY   guaiFENesin-Codeine 200-8 MG/5ML LIQD 1 tsp qhs prn cough   hydrochlorothiazide (HYDRODIURIL) 25 MG tablet Take 1 tablet (25 mg total) by mouth daily. Please schedule an office visit before anymore refills.   OXYGEN Inhale into the lungs daily. Uses at night with CPAP and PRN during daytime   pantoprazole (PROTONIX) 40 MG tablet Take 1 tablet (40 mg total) by mouth 2 (two) times daily. Please schedule an office visit before  anymore refills.   Tavaborole (KERYDIN) 5 % SOLN Apply to toenails and fingernails at bedtime daily   traZODone (DESYREL) 150 MG tablet Take 1 tablet (150 mg total) by mouth at bedtime. Please schedule an office visit before anymore refills.   No facility-administered encounter medications on file as of 09/08/2021.    Patient Active Problem List   Diagnosis Date Noted   Pacemaker 09/06/2021   Incisional  hernia, without obstruction or gangrene 06/03/2016   Osteoporosis 02/22/2016   Musculoskeletal chest pain 07/14/2015   Chronic respiratory failure ([place]) 07/14/2015   Acute bronchitis 07/14/2015   Other emphysema ([place]) 07/14/2015   Chronic renal insufficiency 07/14/2015   Chest pain 07/13/2015   Multiple pulmonary nodules 06/02/2015   Colon polyp 05/29/2015   Hemoptysis 04/29/2015   Sprain of ankle 04/14/2015   Anxiety 04/14/2015   Barrett's esophagus 04/14/2015   Acute exacerbation of chronic obstructive airways disease ([place]) 04/14/2015   Bursitis 04/14/2015   Cellulitis 04/14/2015   Claudication ([place]) 04/14/2015   Cough 04/14/2015   Deficiency, 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   Clinical depression 04/14/2015   Dizziness 04/14/2015   Dyslipidemia 04/14/2015   Essential (primary) hypertension 04/14/2015   Flank pain 04/14/2015   H/O suicide attempt 04/14/2015   Dysphonia 04/14/2015   Hypercholesteremia 04/14/2015   Cannot sleep 04/14/2015   Malaise and fatigue 04/14/2015   Mild cognitive impairment 04/14/2015   Cramp in muscle 04/14/2015   Muscle ache 04/14/2015   Neuropathy 04/14/2015   Adiposity 04/14/2015   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   Candida infection of mouth 04/14/2015   Abnormal loss of weight 04/14/2015   Decreased body weight 04/14/2015   Artificial cardiac pacemaker 10/08/2014   Apnea, sleep 10/08/2014   COPD (chronic obstructive pulmonary disease) ([place]) 02/13/2012   Tobacco abuse 02/13/2012   Sinus congestion 02/13/2012   Current tobacco use 02/13/2012   Esophagitis, reflux 08/28/2006   Abdominal pain, right lower quadrant 08/22/2005   Acute appendicitis with peritoneal abscess 08/22/2005    Conditions to be addressed/monitored:COPD Patient Care Plan: COPD (Adult)     Problem Identified: Symptom Exacerbation (COPD)      Long-Range Goal: Symptom Exacerbation Prevented or Minimized   Start Date: 05/21/2021  Expected End Date: 09/18/2021   Priority: High  Note:   Current Barriers:  Chronic Disease Management needs and support r/t COPD self-management  Case Manager Clinical Goal(s): Over the next 120 days, patient will not require hospitalization or emergent care d/t complications r/t COPD exacerbation.   Interventions:  Collaboration with Jerrol Banana., MD regarding development and update of comprehensive plan of care as evidenced by provider attestation and co-signature Inter-disciplinary care team collaboration (see longitudinal plan of care) Reviewed medications and compliance with treatment plan. Reports taking medications as prescribed. She will run out of Breztri inhaler within the week if mail supply is not received. Pending follow up from [facility] and Me regarding anticipated timeframe for receiving her Breztri inhalers. We discussed need to contact the Pulmonology clinic to determine if samples are available in the event the inhalers are not received. Continues to use supplemental oxygen via CPAP at 3L/min. Advised to continue assessing symptoms r/t COPD daily. Advised to notify a provider for evaluation if symptoms persist for 48 hours without improvement.  Update on 09/08/21: Crystal White was recently evaluated in the ED for a viral upper respiratory infection. She continues to experience a productive cough with yellow tinged sputum. Reports shortness of breath with exertion but doing well at rest. Her request for  Judithann Sauger was submitted however she has not received an update regarding anticipated availability. A message was forwarded to the [facility] Pharmacist and technician today regarding options for samples.  Thoroughly discussed worsening s/sx and indications for seeking immediate medical attention     Patient Self Care Activities:  Self administer medications and utilize inhalers as prescribed Monitor symptoms and follow recommended COPD Action plan Consider plan for smoking cessation Avoid known COPD "triggers" and  activities that may cause overexertion. Contact provider or care management team with new questions  and concerns         PLAN A member of the care management team will follow up within the next week.   [name] [facility] Care Management [facility] [phone]

## 2021-09-08 NOTE — Patient Instructions (Addendum)
Thank you for allowing the Chronic Care Management team to participate in your care.    Patient Care Plan: COPD (Adult)     Problem Identified: Symptom Exacerbation (COPD)      Long-Range Goal: Symptom Exacerbation Prevented or Minimized   Start Date: 05/21/2021  Expected End Date: 09/18/2021  Priority: High  Note:   Current Barriers:  Chronic Disease Management needs and support r/t COPD self-management  Case Manager Clinical Goal(s): Over the next 120 days, patient will not require hospitalization or emergent care d/t complications r/t COPD exacerbation.   Interventions:  Collaboration with Jerrol Banana., MD regarding development and update of comprehensive plan of care as evidenced by provider attestation and co-signature Inter-disciplinary care team collaboration (see longitudinal plan of care) Reviewed medications and compliance with treatment plan. Reports taking medications as prescribed. She will run out of Breztri inhaler within the week if mail supply is not received. Pending follow up from [facility] and Me regarding anticipated timeframe for receiving her Breztri inhalers. We discussed need to contact the Pulmonology clinic to determine if samples are available in the event the inhalers are not received. Continues to use supplemental oxygen via CPAP at 3L/min. Advised to continue assessing symptoms r/t COPD daily. Advised to notify a provider for evaluation if symptoms persist for 48 hours without improvement.  Update on 09/08/21: Mrs. Obarr was recently evaluated in the ED for a viral upper respiratory infection. She continues to experience a productive cough with yellow tinged sputum. Reports shortness of breath with exertion but doing well at rest. Her request for Judithann Sauger was submitted however she has not received an update regarding anticipated availability. A message was forwarded to the [facility] Pharmacist and technician today regarding options for samples.  Thoroughly  discussed worsening s/sx and indications for seeking immediate medical attention     Patient Self Care Activities:  Self administer medications and utilize inhalers as prescribed Monitor symptoms and follow recommended COPD Action plan Consider plan for smoking cessation Avoid known COPD "triggers" and activities that may cause overexertion. Contact provider or care management team with new questions  and concerns         Mrs. Mian verbalized understanding of the information discussed during the telephonic outreach. Declined need for mailed/printed instructions. A member of the care management team will follow up within the next week.   [name] [facility] Care Management [facility] [phone]

## 2021-09-08 NOTE — Telephone Encounter (Cosign Needed)
Philis Nettle, clinical pharmacist assistant, calling on behalf of pt. She states that the pt was approved for the breztri inhaler, but that it will not be available until 2-10 business days. She is requesting to have a sample for pt is possible to hold her until then. Please advise.       [phone]

## 2021-09-09 ENCOUNTER — Ambulatory Visit: Payer: Self-pay

## 2021-09-09 DIAGNOSIS — J441 Chronic obstructive pulmonary disease with (acute) exacerbation: Secondary | ICD-10-CM

## 2021-09-09 NOTE — Telephone Encounter (Signed)
Sample at desk. Tried calling pt. No answer and no vm.

## 2021-09-09 NOTE — Telephone Encounter (Signed)
Please advise sample?

## 2021-09-12 NOTE — Chronic Care Management (AMB) (Signed)
Chronic Care Management   CCM RN Visit Note   Name: Crystal White MRN: [id number] DOB: January 12, 1941  Subjective: Crystal White is a 80 y.o. year old female who is a primary care patient of Jerrol Banana., MD. The care management team was consulted for assistance with disease management and care coordination needs.    Engaged with patient by telephone for follow up visit in response to provider referral for case management and care coordination services.   Consent to Services:  The patient was given information about Chronic Care Management services, agreed to services, and gave verbal consent prior to initiation of services.  Please see initial visit note for detailed documentation.     Assessment: Review of patient past medical history, allergies, medications, health status, including review of consultants reports, laboratory and other test data, was performed as part of comprehensive evaluation and provision of chronic care management services.   SDOH (Social Determinants of Health) assessments and interventions performed:  No  CCM Care Plan  Allergies  Allergen Reactions   Iodinated Diagnostic Agents Other (See Comments)    Burning sensation during contrast media injections. Patient states the IV contrast makes her very hot. She has had multiple CT Scans with IV contrast and states she only gets the warm sensation. This is not an allergic reaction, but is noted in her [place] chart as well. Aggie Hacker, ARRT R CT, had this discussion with her on 01/10/19 at 1130am.   Bacitracin Swelling and Rash   Statins Other (See Comments) and Rash    Reaction:  Leg cramps     Outpatient Encounter Medications as of 09/09/2021  Medication Sig   losartan (COZAAR) 100 MG tablet TAKE 1 TABLET(100 MG) BY MOUTH DAILY   mirtazapine (REMERON) 30 MG tablet TAKE 1 TABLET BY MOUTH AT  BEDTIME   acetaminophen (TYLENOL) 500 MG tablet Take 1,000 mg by mouth every 6 (six) hours as needed  (pain/headaches.).    albuterol (PROVENTIL HFA) 108 (90 Base) MCG/ACT inhaler Inhale 2 puffs into the lungs every 6 (six) hours as needed for wheezing or shortness of breath.   albuterol (PROVENTIL) (2.5 MG/3ML) 0.083% nebulizer solution Take 3 mLs (2.5 mg total) by nebulization every 4 (four) hours as needed for wheezing or shortness of breath.   benzonatate (TESSALON) 200 MG capsule Take 1 capsule (200 mg total) by mouth 2 (two) times daily as needed for cough. For day time cough   Budeson-Glycopyrrol-Formoterol (BREZTRI AEROSPHERE) 160-9-4.8 MCG/ACT AERO Inhale 2 puffs into the lungs in the morning and at bedtime.   citalopram (CELEXA) 10 MG tablet Take 1 tablet (10 mg total) by mouth at bedtime. Please schedule an office visit before anymore refills.   ezetimibe (ZETIA) 10 MG tablet Take 1 tablet (10 mg total) by mouth daily. Please schedule an office visit before anymore refills.   fexofenadine (ALLEGRA ALLERGY) 180 MG tablet Take 1 tablet (180 mg total) by mouth daily. For rhinitis   gabapentin (NEURONTIN) 100 MG capsule TAKE 2 CAPSULES BY MOUTH 3  TIMES DAILY   guaiFENesin-Codeine 200-8 MG/5ML LIQD 1 tsp qhs prn cough   hydrochlorothiazide (HYDRODIURIL) 25 MG tablet Take 1 tablet (25 mg total) by mouth daily. Please schedule an office visit before anymore refills.   OXYGEN Inhale into the lungs daily. Uses at night with CPAP and PRN during daytime   pantoprazole (PROTONIX) 40 MG tablet Take 1 tablet (40 mg total) by mouth 2 (two) times daily. Please schedule an office visit  before anymore refills.   Tavaborole (KERYDIN) 5 % SOLN Apply to toenails and fingernails at bedtime daily   traZODone (DESYREL) 150 MG tablet Take 1 tablet (150 mg total) by mouth at bedtime. Please schedule an office visit before anymore refills.   No facility-administered encounter medications on file as of 09/09/2021.    Patient Active Problem List   Diagnosis Date Noted   Pacemaker 09/06/2021   Incisional hernia,  without obstruction or gangrene 06/03/2016   Osteoporosis 02/22/2016   Musculoskeletal chest pain 07/14/2015   Chronic respiratory failure ([place]) 07/14/2015   Acute bronchitis 07/14/2015   Other emphysema ([place]) 07/14/2015   Chronic renal insufficiency 07/14/2015   Chest pain 07/13/2015   Multiple pulmonary nodules 06/02/2015   Colon polyp 05/29/2015   Hemoptysis 04/29/2015   Sprain of ankle 04/14/2015   Anxiety 04/14/2015   Barrett's esophagus 04/14/2015   Acute exacerbation of chronic obstructive airways disease ([place]) 04/14/2015   Bursitis 04/14/2015   Cellulitis 04/14/2015   Claudication ([place]) 04/14/2015   Cough 04/14/2015   Deficiency, 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   Clinical depression 04/14/2015   Dizziness 04/14/2015   Dyslipidemia 04/14/2015   Essential (primary) hypertension 04/14/2015   Flank pain 04/14/2015   H/O suicide attempt 04/14/2015   Dysphonia 04/14/2015   Hypercholesteremia 04/14/2015   Cannot sleep 04/14/2015   Malaise and fatigue 04/14/2015   Mild cognitive impairment 04/14/2015   Cramp in muscle 04/14/2015   Muscle ache 04/14/2015   Neuropathy 04/14/2015   Adiposity 04/14/2015   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   Candida infection of mouth 04/14/2015   Abnormal loss of weight 04/14/2015   Decreased body weight 04/14/2015   Artificial cardiac pacemaker 10/08/2014   Apnea, sleep 10/08/2014   COPD (chronic obstructive pulmonary disease) ([place]) 02/13/2012   Tobacco abuse 02/13/2012   Sinus congestion 02/13/2012   Current tobacco use 02/13/2012   Esophagitis, reflux 08/28/2006   Abdominal pain, right lower quadrant 08/22/2005   Acute appendicitis with peritoneal abscess 08/22/2005    Conditions to be addressed/monitored:COPD Patient Care Plan: COPD (Adult)     Problem Identified: Symptom Exacerbation (COPD)      Long-Range Goal: Symptom Exacerbation Prevented or Minimized   Start Date: 05/21/2021  Expected End Date: 09/18/2021  Priority:  High  Note:   Current Barriers:  Chronic Disease Management needs and support r/t COPD self-management  Case Manager Clinical Goal(s): Over the next 120 days, patient will not require hospitalization or emergent care d/t complications r/t COPD exacerbation.   Interventions:  Collaboration with Jerrol Banana., MD regarding development and update of comprehensive plan of care as evidenced by provider attestation and co-signature Inter-disciplinary care team collaboration (see longitudinal plan of care) Reviewed medications and compliance with treatment plan. Reports taking medications as prescribed. She will run out of Breztri inhaler within the week if mail supply is not received. Pending follow up from [facility] and Me regarding anticipated timeframe for receiving her Breztri inhalers. We discussed need to contact the Pulmonology clinic to determine if samples are available in the event the inhalers are not received. Continues to use supplemental oxygen via CPAP at 3L/min. Advised to continue assessing symptoms r/t COPD daily. Advised to notify a provider for evaluation if symptoms persist for 48 hours without improvement.  Update on 09/09/21: Crystal White called regarding plan for [employer]. She was contacted and informed a sample would be available for pick up [facility]. Wanted to confirm time that the inhaler would be available. Advised of clinic hours. She  will inform her grandson to report to the clinic front desk to obtain the inhaler before 5pm. Agreed to call if additional assistance is required.      Patient Self Care Activities:  Self administer medications and utilize inhalers as prescribed Monitor symptoms and follow recommended COPD Action plan Consider plan for smoking cessation Avoid known COPD "triggers" and activities that may cause overexertion. Contact provider or care management team with new questions  and concerns        PLAN A member of the care  management team will follow up within the next month.   [name] [facility] Care Management [facility] [phone]

## 2021-09-20 ENCOUNTER — Telehealth: Payer: Self-pay

## 2021-09-20 NOTE — Progress Notes (Signed)
    Chronic Care Management Pharmacy Assistant   Name: AVONELL LENIG  MRN: [id number] DOB: 1941-02-20  Patient called to be reminded of her telephone appointment with [name], CPP on 09/21/2021 @ 1300.  Patient aware of appointment date, time, and type of appointment (either telephone or in person). Patient aware to have/bring all medications, supplements, blood pressure and/or blood sugar logs to visit.  Questions: Are there any concerns you would like to discuss during your office visit? Nothing she can think of. Patient just would like to have an appointment scheduled with Dr. Rosanna Randy as she has not seen him in person in a few years. I informed the patient I would give Dr. Alben Spittle Office a call, and have the scheduler call her to schedule.   Are you having any problems obtaining your medications?  No  If patient has any PAP medications ask if they are having any problems getting their PAP medication or refill? Patient is on patient Assistance for [facility], and should have already received her first shipment. I did forget to ask her did she receive it.  Star Rating Drug: Losartan 100 mg last filled on 08/26/2021 for a 90-Day supply with [facility]  Any gaps in medications fill history? no  Care Gaps: Hepatitis C Screening Influenza Vaccine (Last completed 08/11/2020) Zoster Vaccines  BP > 140/90 (Last taken in PCP Office 11/12/2020 191/72)  Spoke with Dr. Alben Spittle office, and they are going to call the patient to get her scheduled to see him on the next available appointment   Lynann Bologna, [place] Pharmacist Assistant Phone: [phone]

## 2021-09-21 ENCOUNTER — Ambulatory Visit: Payer: Medicare Other

## 2021-09-21 DIAGNOSIS — E7800 Pure hypercholesterolemia, unspecified: Secondary | ICD-10-CM

## 2021-09-21 DIAGNOSIS — I10 Essential (primary) hypertension: Secondary | ICD-10-CM

## 2021-09-21 NOTE — Progress Notes (Signed)
Chronic Care Management Pharmacy Note  09/27/2021 Name:  Crystal White MRN:  [id number] DOB:  1941-04-02  Summary: Patient presents for CCM follow-up  Recommendations/Changes made from today's visit: Continue current medications   Plan: CPP follow-up in 3 months   Subjective: Crystal White is an 80 y.o. year old female who is a primary patient of Jerrol Banana., MD.  The CCM team was consulted for assistance with disease management and care coordination needs.    Engaged with patient by telephone for initial visit in response to provider referral for pharmacy case management and/or care coordination services.   Consent to Services:  The patient was given the following information about Chronic Care Management services today, agreed to services, and gave verbal consent: 1. CCM service includes personalized support from designated clinical staff supervised by the primary care provider, including individualized plan of care and coordination with other care providers 2. 24/7 contact phone numbers for assistance for urgent and routine care needs. 3. Service will only be billed when office clinical staff spend 20 minutes or more in a month to coordinate care. 4. Only one practitioner may furnish and bill the service in a calendar month. 5.The patient may stop CCM services at any time (effective at the end of the month) by phone call to the office staff. 6. The patient will be responsible for cost sharing (co-pay) of up to 20% of the service fee (after annual deductible is met). Patient agreed to services and consent obtained.  Patient Care Team: Jerrol Banana., MD as PCP - General (Unknown Physician Specialty) Samara Deist, DPM as Referring Physician (Podiatry) Isaias Cowman, MD as Consulting Physician (Cardiology) Neldon Labella, RN as Case Manager Germaine Pomfret, [facility] (Pharmacist)  Recent office visits: 12/21/2020 [name], MD (PCP Video Visit) for  follow-up- No orders placed at this visit, no medication changes noted  Recent consult visits: 07/21/21: Patient presented to Clabe Seal, [place] (Cardiology) for follow-up. BP 170/92. No meds changed.   04/02/2021 Vernard Gambles, MD (Pulmonology) For Follow-up- Reassess Pulmonary function testing ordered, Echocardiogram complete ordered, Trail of Breztri 2 puffs daily; Stop Breo; Oxygen order placed; Patient instructed to follow-up in 4 to 6 weeks.   02/10/2021 Isaias Cowman, MD (Cardiology) for Hospital follow-up- No medication changes noted. Patient instructed to return in 4 months   01/20/2021 Isaias Cowman, MD (Cardiology) Follow-up for Hypertension and Atrioventricular block No medication changes noted, X-ray Chest PA and lateral ordered, Labs ordered  Hospital visits: Admitted to the ED on 09/06/21 for Viral URI ith cough. Benzonatate, Fexofenadine, Guaifenesin-Codeine started.    Objective:  Lab Results  Component Value Date   CREATININE 1.83 (H) 11/11/2020   BUN 40 (H) 11/11/2020   GFRNONAA 28 (L) 11/11/2020   GFRAA 31 (L) 08/11/2020   NA 141 11/11/2020   K 4.1 11/11/2020   CALCIUM 8.9 11/11/2020   CO2 28 11/11/2020   GLUCOSE 98 11/11/2020    Lab Results  Component Value Date/Time   HGBA1C 5.5 01/24/2017 11:32 AM    Last diabetic Eye exam: No results found for: HMDIABEYEEXA  Last diabetic Foot exam: No results found for: HMDIABFOOTEX   Lab Results  Component Value Date   CHOL 232 (H) 08/11/2020   HDL 52 08/11/2020   LDLCALC 148 (H) 08/11/2020   TRIG 180 (H) 08/11/2020   CHOLHDL 4.5 (H) 08/11/2020    Hepatic Function Latest Ref Rng & Units 08/11/2020 02/13/2020 10/19/2018  Total Protein 6.0 - 8.5 g/dL  6.9 6.6 7.1  Albumin 3.7 - 4.7 g/dL 4.3 4.1 4.8  AST 0 - 40 IU/L 9 13 15   ALT 0 - 32 IU/L 6 7 9   Alk Phosphatase 44 - 121 IU/L 89 95 78  Total Bilirubin 0.0 - 1.2 mg/dL 0.3 0.3 0.3    Lab Results  Component Value Date/Time   TSH 1.160 08/11/2020  10:46 AM   TSH 1.580 02/13/2020 04:42 PM    CBC Latest Ref Rng & Units 11/11/2020 08/11/2020 02/13/2020  WBC 4.0 - 10.5 K/uL 7.6 9.4 9.4  Hemoglobin 12.0 - 15.0 g/dL 15.6(H) 15.3 15.2  Hematocrit 36.0 - 46.0 % 47.4(H) 46.5 44.7  Platelets 150 - 400 K/uL 188 197 188    No results found for: VD25OH  Clinical ASCVD: No  The 10-year ASCVD risk score (Arnett DK, et al., 2019) is: 51.7%   Values used to calculate the score:     Age: 28 years     Sex: Female     Is Non-Hispanic African American: No     Diabetic: No     Tobacco smoker: Yes     Systolic Blood Pressure: 353 mmHg     Is BP treated: Yes     HDL Cholesterol: 52 mg/dL     Total Cholesterol: 232 mg/dL    Depression screen [facility] 2/9 01/12/2021 10/29/2020 04/28/2020  Decreased Interest 0 0 0  Down, Depressed, Hopeless 0 0 0  PHQ - 2 Score 0 0 0  Altered sleeping - - -  Tired, decreased energy - - -  Change in appetite - - -  Feeling bad or failure about yourself  - - -  Trouble concentrating - - -  Moving slowly or fidgety/restless - - -  Suicidal thoughts - - -  PHQ-9 Score - - -  Difficult doing work/chores - - -    Social History   Tobacco Use  Smoking Status Every Day   Packs/day: 1.00   Years: 55.00   Pack years: 55.00   Types: Cigarettes  Smokeless Tobacco Never  Tobacco Comments   Currently smoking a pack/day   BP Readings from Last 3 Encounters:  09/06/21 (!) 162/96  04/02/21 (!) 170/84  02/02/21 (!) 157/58   Pulse Readings from Last 3 Encounters:  09/06/21 83  04/02/21 78  02/02/21 65   Wt Readings from Last 3 Encounters:  04/02/21 152 lb 6.4 oz (69.1 kg)  02/02/21 140 lb (63.5 kg)  11/23/20 140 lb (63.5 kg)   BMI Readings from Last 3 Encounters:  04/02/21 28.61 kg/m  02/02/21 23.66 kg/m  11/23/20 24.03 kg/m    Assessment/Interventions: Review of patient past medical history, allergies, medications, health status, including review of consultants reports, laboratory and other test data, was  performed as part of comprehensive evaluation and provision of chronic care management services.   SDOH:  (Social Determinants of Health) assessments and interventions performed: Yes SDOH Interventions    Flowsheet Row Most Recent Value  SDOH Interventions   Financial Strain Interventions Other (Comment)  [PAP]      SDOH Screenings   Alcohol Screen: Low Risk    Last Alcohol Screening Score (AUDIT): 1  Depression (PHQ2-9): Low Risk    PHQ-2 Score: 0  Financial Resource Strain: Medium Risk   Difficulty of Paying Living Expenses: Somewhat hard  Food Insecurity: No Food Insecurity   Worried About Charity fundraiser in the Last Year: Never true   Ran Out of Food in the Last Year: Never  true  Housing: Not on file  Physical Activity: Not on file  Social Connections: Not on file  Stress: No Stress Concern Present   Feeling of Stress : Not at all  Tobacco Use: High Risk   Smoking Tobacco Use: Every Day   Smokeless Tobacco Use: Never   Passive Exposure: Not on file  Transportation Needs: No Transportation Needs   Lack of Transportation (Medical): No   Lack of Transportation (Non-Medical): No    CCM Care Plan  Allergies  Allergen Reactions   Iodinated Diagnostic Agents Other (See Comments)    Burning sensation during contrast media injections. Patient states the IV contrast makes her very hot. She has had multiple CT Scans with IV contrast and states she only gets the warm sensation. This is not an allergic reaction, but is noted in her [place] chart as well. Aggie Hacker, ARRT R CT, had this discussion with her on 01/10/19 at 1130am.   Bacitracin Swelling and Rash   Statins Other (See Comments) and Rash    Reaction:  Leg cramps     Medications Reviewed Today     Reviewed by Neldon Labella, RN (Registered Nurse) on 09/08/21 at 1434  Med List Status: <None>   Medication Order Taking? Sig Documenting Provider Last Dose Status Informant  acetaminophen (TYLENOL) 500 MG  tablet [id number] No Take 1,000 mg by mouth every 6 (six) hours as needed (pain/headaches.).  [provider] Taking Active Self  albuterol (PROVENTIL HFA) 108 (90 Base) MCG/ACT inhaler [id number] No Inhale 2 puffs into the lungs every 6 (six) hours as needed for wheezing or shortness of breath. Jerrol Banana., MD 09/06/2021 Active   albuterol (PROVENTIL) (2.5 MG/3ML) 0.083% nebulizer solution [id number]  Take 3 mLs (2.5 mg total) by nebulization every 4 (four) hours as needed for wheezing or shortness of breath. Rodriguez-Southworth, Sunday Spillers, PA-C  Active   benzonatate (TESSALON) 200 MG capsule [id number]  Take 1 capsule (200 mg total) by mouth 2 (two) times daily as needed for cough. For day time cough Rodriguez-Southworth, Sunday Spillers, PA-C  Active   Budeson-Glycopyrrol-Formoterol (BREZTRI AEROSPHERE) 160-9-4.8 MCG/ACT Hollie Salk [id number]  Inhale 2 puffs into the lungs in the morning and at bedtime. Tyler Pita, MD  Active   citalopram (CELEXA) 10 MG tablet [id number]  Take 1 tablet (10 mg total) by mouth at bedtime. Please schedule an office visit before anymore refills. Jerrol Banana., MD  Active   ezetimibe (ZETIA) 10 MG tablet [id number]  Take 1 tablet (10 mg total) by mouth daily. Please schedule an office visit before anymore refills. Jerrol Banana., MD  Active   fexofenadine [facility] ALLERGY) 180 MG tablet [id number]  Take 1 tablet (180 mg total) by mouth daily. For rhinitis Rodriguez-Southworth, Sandrea Matte  Active   gabapentin (NEURONTIN) 100 MG capsule [id number] No TAKE 2 CAPSULES BY MOUTH 3  TIMES DAILY Jerrol Banana., MD Taking Active   guaiFENesin-Codeine 200-8 MG/5ML LIQD [id number]  1 tsp qhs prn cough Rodriguez-Southworth, Sunday Spillers, [place]  Active   hydrochlorothiazide (HYDRODIURIL) 25 MG tablet [id number]  Take 1 tablet (25 mg total) by mouth daily. Please schedule an office visit before anymore refills. Jerrol Banana., MD  Active   losartan (COZAAR)  100 MG tablet [id number]  TAKE 1 TABLET(100 MG) BY MOUTH DAILY Jerrol Banana., MD  Active   mirtazapine (REMERON) 30 MG tablet [id number]  TAKE 1 TABLET BY MOUTH AT  BEDTIME Jerrol Banana., MD  Active  OXYGEN [id number] No Inhale into the lungs daily. Uses at night with CPAP and PRN during daytime [provider] Taking Active Self  pantoprazole (PROTONIX) 40 MG tablet [id number]  Take 1 tablet (40 mg total) by mouth 2 (two) times daily. Please schedule an office visit before anymore refills. Jerrol Banana., MD  Active   [facility]) 5 % Bailey Mech [id number] No Apply to toenails and fingernails at bedtime daily Ralene Bathe, MD Taking Active   traZODone (DESYREL) 150 MG tablet [id number]  Take 1 tablet (150 mg total) by mouth at bedtime. Please schedule an office visit before anymore refills. Jerrol Banana., MD  Active             Patient Active Problem List   Diagnosis Date Noted   Pacemaker 09/06/2021   Incisional hernia, without obstruction or gangrene 06/03/2016   Osteoporosis 02/22/2016   Musculoskeletal chest pain 07/14/2015   Chronic respiratory failure ([place]) 07/14/2015   Acute bronchitis 07/14/2015   Other emphysema ([place]) 07/14/2015   Chronic renal insufficiency 07/14/2015   Chest pain 07/13/2015   Multiple pulmonary nodules 06/02/2015   Colon polyp 05/29/2015   Hemoptysis 04/29/2015   Sprain of ankle 04/14/2015   Anxiety 04/14/2015   Barrett's esophagus 04/14/2015   Acute exacerbation of chronic obstructive airways disease ([place]) 04/14/2015   Bursitis 04/14/2015   Cellulitis 04/14/2015   Claudication ([place]) 04/14/2015   Cough 04/14/2015   Deficiency, 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   Clinical depression 04/14/2015   Dizziness 04/14/2015   Dyslipidemia 04/14/2015   Essential (primary) hypertension 04/14/2015   Flank pain 04/14/2015   H/O suicide attempt 04/14/2015   Dysphonia 04/14/2015   Hypercholesteremia 04/14/2015    Cannot sleep 04/14/2015   Malaise and fatigue 04/14/2015   Mild cognitive impairment 04/14/2015   Cramp in muscle 04/14/2015   Muscle ache 04/14/2015   Neuropathy 04/14/2015   Adiposity 04/14/2015   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   Candida infection of mouth 04/14/2015   Abnormal loss of weight 04/14/2015   Decreased body weight 04/14/2015   Artificial cardiac pacemaker 10/08/2014   Apnea, sleep 10/08/2014   COPD (chronic obstructive pulmonary disease) ([place]) 02/13/2012   Tobacco abuse 02/13/2012   Sinus congestion 02/13/2012   Current tobacco use 02/13/2012   Esophagitis, reflux 08/28/2006   Abdominal pain, right lower quadrant 08/22/2005   Acute appendicitis with peritoneal abscess 08/22/2005    Immunization History  Administered Date(s) Administered   Fluad Quad(high Dose 65+) 08/11/2020   Influenza Split 09/20/2006, 09/02/2010, 09/10/2012   Influenza Whole 08/28/2012   Influenza, High Dose Seasonal PF 08/28/2014, 08/10/2015, 07/25/2016, 10/03/2017, 08/30/2018, 09/18/2019   Influenza-Unspecified 08/28/2013   Moderna SARS-COV2 Booster Vaccination 10/12/2020   Moderna Sars-Covid-2 Vaccination 01/09/2020, 02/06/2020   Pneumococcal Conjugate-13 08/28/2014   Pneumococcal Polysaccharide-23 03/07/2011   Tdap 08/28/2014   Zoster, Live 11/04/2012    Conditions to be addressed/monitored:  Hypertension, Hyperlipidemia, GERD, COPD, Depression, Anxiety, Osteoporosis, and Tobacco use  Care Plan : General Pharmacy (Adult)  Updates made by Germaine Pomfret, RPH since 09/27/2021 12:00 AM     Problem: Hypertension, Hyperlipidemia, GERD, COPD, Depression, Anxiety, Osteoporosis, and Tobacco use   Priority: High     Long-Range Goal: Patient-Specific Goal   Start Date: 06/21/2021  Expected End Date: 06/25/2022  This Visit's Progress: On track  Recent Progress: On track  Priority: High  Note:   Current Barriers:  Unable to achieve control of blood pressure  Unable to achieve  control of cholesterol  Pharmacist Clinical  Goal(s):  Patient will achieve control of blood pressure as evidenced by BP less than 140/90 achieve control of cholesterol as evidenced by LDL less than 100 through collaboration with PharmD and provider.   Interventions: 1:1 collaboration with Jerrol Banana., MD regarding development and update of comprehensive plan of care as evidenced by provider attestation and co-signature Inter-disciplinary care team collaboration (see longitudinal plan of care) Comprehensive medication review performed; medication list updated in electronic medical record  Hypertension (BP goal <140/90) -Uncontrolled -Current treatment: HCTZ 25 mg daily  Losartan 100 mg daily  -Medications previously tried: Amlodipine (fatigue),   -Current home readings: 140-159/80s  -Current dietary habits: Salts food. Drinks 2 cups cafeine.  -Current exercise habits: Minimal, limited by COPD, breathlessness -Denies hypotensive/hypertensive symptoms -Counseled to monitor BP at home every other day, document, and provide log at future appointments -Recommended to continue current medication  Hyperlipidemia: (LDL goal < 100) -Uncontrolled -Current treatment: Ezetimibe 10 mg daily  -Medications previously tried: Statins   -Educated on Importance of limiting foods high in cholesterol; -Recommended to continue current medication  COPD (Goal: control symptoms and prevent exacerbations) -Controlled -Current treatment  Albuterol 2 puffs every 6 hours as needed Breztri 2 puffs twice daily  -Medications previously tried: NA  -Gold Grade: Gold 2 (FEV1 50-79%) -Current COPD Classification:  B (high sx, <2 exacerbations/yr) -Pulmonary function testing: FEV1 65% (2013) -Exacerbations requiring treatment in last 6 months: Viral URI on 09/06/21 requiring ED visit.  -Patient reports consistent use of maintenance inhaler -Frequency of rescue inhaler use: Rarely, patient always  takes when leaving home -Counseled on When to use rescue inhaler -Recommended to continue current medication  Depression/Anxiety (Goal: Maintain stable mood) -Controlled -Current treatment: Citalopram 10 mg nightly  Mirtazapine 30 mg nightly  Trazodone 150 mg nightly  -Medications previously tried/failed: NA -7 hours sleep. Takes 3 hours to fall asleep. Does wake up multiple times, falls back asleep quickly. -PHQ9: 0 -GAD7: 0 -Recommended to continue current medication  GERD (Goal: Prevent Heartburn) -Controlled -History of Hitial hernia, Barrett's Esophagus   -Current treatment  Pantoprazole 40 mg twice daily  -Medications previously tried: NA  -Very rarely has symptoms. Has only ever taken twice daily.  -Recommended to continue current medication  Chronic Kidney Disease Stage 3b  -All medications assessed for renal dosing and appropriateness in chronic kidney disease. -Recommended to continue current medication  Patient Goals/Self-Care Activities Patient will:  - check blood pressure every other day, document, and provide at future appointments  Follow Up Plan: Telephone follow up appointment with care management team member scheduled for:  12/13/2021 at 1:00 PM       Medication Assistance: None required.  Patient affirms current coverage meets needs.  Compliance/Adherence/Medication fill history: Care Gaps: Hepatitis C Screening  Star-Rating Drugs: Losartan 100 mg last filled on 05/28/2021 for a 90-Day supply with [facility]  Patient's preferred pharmacy is:  [facility] [address] [place] [address] Phone: [phone] Fax: [place], [place] - [address] AT [address] [facility] [place] [address] Phone: [phone] Fax: [phone]  [facility]  ([place], [place] [facility] [address] [place] [address] Phone: [phone] Fax: [phone]  [facility] ([facility]) - [place], [address]  [place] [address] Phone: [phone] Fax: [phone]  Uses pill box? Yes Pt endorses 100% compliance  We discussed: Current pharmacy is preferred with insurance plan and patient is satisfied with pharmacy services Patient decided to: Continue current medication management strategy  Care Plan and Follow Up Patient Decision:  Patient agrees to Care Plan and Follow-up.  Plan: Telephone follow up appointment with care management team member scheduled for:  12/13/2021 at 1:00 PM  [name], PharmD, [place], [place] [phone]

## 2021-09-27 DIAGNOSIS — J441 Chronic obstructive pulmonary disease with (acute) exacerbation: Secondary | ICD-10-CM

## 2021-09-27 DIAGNOSIS — I10 Essential (primary) hypertension: Secondary | ICD-10-CM

## 2021-09-27 DIAGNOSIS — E7800 Pure hypercholesterolemia, unspecified: Secondary | ICD-10-CM | POA: Diagnosis not present

## 2021-09-27 DIAGNOSIS — J449 Chronic obstructive pulmonary disease, unspecified: Secondary | ICD-10-CM | POA: Diagnosis not present

## 2021-09-27 NOTE — Patient Instructions (Signed)
Visit Information It was great speaking with you today!  Please let me know if you have any questions about our visit.   Goals Addressed             This Visit's Progress    Track and Manage My Blood Pressure-Hypertension       Timeframe:  Long-Range Goal Priority:  High Start Date:  06/21/2021                           Expected End Date:  06/21/2022                     Follow Up within 90 days   - check blood pressure 3 times per week    Why is this important?   You won't feel high blood pressure, but it can still hurt your blood vessels.  High blood pressure can cause heart or kidney problems. It can also cause a stroke.  Making lifestyle changes like losing a little weight or eating less salt will help.  Checking your blood pressure at home and at different times of the day can help to control blood pressure.  If the doctor prescribes medicine remember to take it the way the doctor ordered.  Call the office if you cannot afford the medicine or if there are questions about it.     Notes:         Patient Care Plan: COPD (Adult)     Problem Identified: Symptom Exacerbation (COPD)      Long-Range Goal: Symptom Exacerbation Prevented or Minimized   Start Date: 05/21/2021  Expected End Date: 09/18/2021  Priority: High  Note:   Current Barriers:  Chronic Disease Management needs and support r/t COPD self-management  Case Manager Clinical Goal(s): Over the next 120 days, patient will not require hospitalization or emergent care d/t complications r/t COPD exacerbation.   Interventions:  Collaboration with Jerrol Banana., MD regarding development and update of comprehensive plan of care as evidenced by provider attestation and co-signature Inter-disciplinary care team collaboration (see longitudinal plan of care) Reviewed medications and compliance with treatment plan. Reports taking medications as prescribed. She will run out of Breztri inhaler within the week if  mail supply is not received. Pending follow up from [facility] regarding anticipated timeframe for receiving her Breztri inhalers. We discussed need to contact the Pulmonology clinic to determine if samples are available in the event the inhalers are not received. Continues to use supplemental oxygen via CPAP at 3L/min. Advised to continue assessing symptoms r/t COPD daily. Advised to notify a provider for evaluation if symptoms persist for 48 hours without improvement.  Update on 09/09/21: Mrs. Zenia Resides called regarding plan for [employer]. She was contacted and informed a sample would be available for pick up [facility]. Wanted to confirm time that the inhaler would be available. Advised of clinic hours. She will inform her grandson to report to the clinic front desk to obtain the inhaler before 5pm. Agreed to call if additional assistance is required.      Patient Self Care Activities:  Self administer medications and utilize inhalers as prescribed Monitor symptoms and follow recommended COPD Action plan Consider plan for smoking cessation Avoid known COPD "triggers" and activities that may cause overexertion. Contact provider or care management team with new questions  and concerns        Patient Care Plan: Hypertension (Adult)  Problem Identified: Hypertension (Hypertension)      Long-Range Goal: Hypertension Monitored   Start Date: 05/21/2021  Expected End Date: 09/18/2021  Priority: High  Note:   Objective:  Current Barriers:  Chronic Disease Management needs and support r/t Hypertension self management.  Case Manager Clinical Goal(s):  Over the next 120 days, patient will demonstrate improved adherence to prescribed treatment plan for hypertension management as evidenced by taking all medications as prescribed, monitoring and recording blood pressure and adhering to a heart healthy/cardiac prudent diet.  Interventions:  Collaboration with Jerrol Banana., MD regarding development and update of comprehensive plan of care as evidenced by provider attestation and co-signature Inter-disciplinary care team collaboration (see longitudinal plan of care) Discussed compliance with current treatment plan. Reviewed established BP parameters. Reports excellent compliance with medications and monitoring her BP as recommended. Reports home readings have ranged in the low 140's over 70's. Continues to monitor sodium intake. Reports doing well with maintaining a heart healthy diet. Reviewed s/sx of heart attack, stroke and worsening symptoms that require immediate medical attention.  Patient Goals/Self-Care Activities Self administer medications as prescribed Consistently monitor BP and record readings. Follow recommended heart healthy/cardiac prudent diet Contact provider or care management team with new questions and concerns as needed   Follow Up Plan:  Will follow up in two months    Patient Care Plan: Fall Risk     Problem Identified: High Risk for Falls      Long-Range Goal: Fall and  Injury Prevention Completed 05/21/2021  Start Date: 01/12/2021  Expected End Date: 05/12/2021  Priority: High  Note:   Current Barriers:  High Risk for fall related injuries d/t Osteoporosis.  Clinical Goal(s):  Over the next 120 days, patient will not experience accidental falls or require emergent care d/t fall related injuries.  Interventions:  Collaboration with Jerrol Banana., MD regarding development and update of comprehensive plan of care as evidenced by provider attestation and co-signature Inter-disciplinary care team collaboration (see longitudinal plan of care) Reviewed information regarding safety and fall prevention measures. Denies recent falls. Reports ambulating well over the past few weeks. Denies change or decline in activity tolerance. Reports using assistive devices as recommended and continues to use a wheelchair when away from  the home for prolonged periods. Very good family support. Declines need for additional assistance.   Patient Goals/Self-Care Activities -Utilize assistive device appropriately with all ambulation -Ensure pathways are clear and well lit -Change positions slowly and demonstrate self awareness at all times -Wear secure non skid footwear when ambulating -Contact provider or the care management team with questions and concerns as needed   Goal Met    Patient Care Plan: General Pharmacy (Adult)     Problem Identified: Hypertension, Hyperlipidemia, GERD, COPD, Depression, Anxiety, Osteoporosis, and Tobacco use   Priority: High     Long-Range Goal: Patient-Specific Goal   Start Date: 06/21/2021  Expected End Date: 06/25/2022  This Visit's Progress: On track  Recent Progress: On track  Priority: High  Note:   Current Barriers:  Unable to achieve control of blood pressure  Unable to achieve control of cholesterol  Pharmacist Clinical Goal(s):  Patient will achieve control of blood pressure as evidenced by BP less than 140/90 achieve control of cholesterol as evidenced by LDL less than 100 through collaboration with PharmD and provider.   Interventions: 1:1 collaboration with Jerrol Banana., MD regarding development and update of comprehensive plan of care as evidenced by provider attestation  and co-signature Inter-disciplinary care team collaboration (see longitudinal plan of care) Comprehensive medication review performed; medication list updated in electronic medical record  Hypertension (BP goal <140/90) -Uncontrolled -Current treatment: HCTZ 25 mg daily  Losartan 100 mg daily  -Medications previously tried: Amlodipine (fatigue),   -Current home readings: 140-159/80s  -Current dietary habits: Salts food. Drinks 2 cups cafeine.  -Current exercise habits: Minimal, limited by COPD, breathlessness -Denies hypotensive/hypertensive symptoms -Counseled to monitor BP at home  every other day, document, and provide log at future appointments -Recommended to continue current medication  Hyperlipidemia: (LDL goal < 100) -Uncontrolled -Current treatment: Ezetimibe 10 mg daily  -Medications previously tried: Statins   -Educated on Importance of limiting foods high in cholesterol; -Recommended to continue current medication  COPD (Goal: control symptoms and prevent exacerbations) -Controlled -Current treatment  Albuterol 2 puffs every 6 hours as needed Breztri 2 puffs twice daily  -Medications previously tried: NA  -Gold Grade: Gold 2 (FEV1 50-79%) -Current COPD Classification:  B (high sx, <2 exacerbations/yr) -Pulmonary function testing: FEV1 65% (2013) -Exacerbations requiring treatment in last 6 months: Viral URI on 09/06/21 requiring ED visit.  -Patient reports consistent use of maintenance inhaler -Frequency of rescue inhaler use: Rarely, patient always takes when leaving home -Counseled on When to use rescue inhaler -Recommended to continue current medication  Depression/Anxiety (Goal: Maintain stable mood) -Controlled -Current treatment: Citalopram 10 mg nightly  Mirtazapine 30 mg nightly  Trazodone 150 mg nightly  -Medications previously tried/failed: NA -7 hours sleep. Takes 3 hours to fall asleep. Does wake up multiple times, falls back asleep quickly. -PHQ9: 0 -GAD7: 0 -Recommended to continue current medication  GERD (Goal: Prevent Heartburn) -Controlled -History of Hitial hernia, Barrett's Esophagus   -Current treatment  Pantoprazole 40 mg twice daily  -Medications previously tried: NA  -Very rarely has symptoms. Has only ever taken twice daily.  -Recommended to continue current medication  Chronic Kidney Disease Stage 3b  -All medications assessed for renal dosing and appropriateness in chronic kidney disease. -Recommended to continue current medication  Patient Goals/Self-Care Activities Patient will:  - check blood pressure  every other day, document, and provide at future appointments  Follow Up Plan: Telephone follow up appointment with care management team member scheduled for:  12/13/2021 at 1:00 PM      Patient agreed to services and verbal consent obtained.   Patient verbalizes understanding of instructions provided today and agrees to view in [place].   [name], PharmD, [facility] [place], [place] [phone]

## 2021-09-28 ENCOUNTER — Telehealth: Payer: Self-pay

## 2021-09-28 NOTE — Progress Notes (Signed)
    Chronic Care Management Pharmacy Assistant   Name: Crystal White  MRN: [id number] DOB: 04/03/1941  [facility] Onboarding Process  I received a task from [name], CPP to complete the onboarding process for the patient so she can start receiving her medications from [place].  The patient's current pharmacy is [facility] in which I will give them a call to request a profile transfer be sent to [facility].  I contacted [facility] to request the profile transfer for the patient, and the representative stated that only a pharmacist can request to have a transfer profile. I informed CPP of this information.  The Losartan with [facility] the CPP will send in the Rx for that medication.   Medications: Outpatient Encounter Medications as of 09/28/2021  Medication Sig   acetaminophen (TYLENOL) 500 MG tablet Take 1,000 mg by mouth every 6 (six) hours as needed (pain/headaches.).    albuterol (PROVENTIL HFA) 108 (90 Base) MCG/ACT inhaler Inhale 2 puffs into the lungs every 6 (six) hours as needed for wheezing or shortness of breath.   albuterol (PROVENTIL) (2.5 MG/3ML) 0.083% nebulizer solution Take 3 mLs (2.5 mg total) by nebulization every 4 (four) hours as needed for wheezing or shortness of breath.   Budeson-Glycopyrrol-Formoterol (BREZTRI AEROSPHERE) 160-9-4.8 MCG/ACT AERO Inhale 2 puffs into the lungs in the morning and at bedtime.   citalopram (CELEXA) 10 MG tablet Take 1 tablet (10 mg total) by mouth at bedtime. Please schedule an office visit before anymore refills.   ezetimibe (ZETIA) 10 MG tablet Take 1 tablet (10 mg total) by mouth daily. Please schedule an office visit before anymore refills.   gabapentin (NEURONTIN) 100 MG capsule TAKE 2 CAPSULES BY MOUTH 3  TIMES DAILY   hydrochlorothiazide (HYDRODIURIL) 25 MG tablet Take 1 tablet (25 mg total) by mouth daily. Please schedule an office visit before anymore refills.   losartan (COZAAR) 100 MG tablet TAKE 1 TABLET(100 MG) BY MOUTH  DAILY   mirtazapine (REMERON) 30 MG tablet TAKE 1 TABLET BY MOUTH AT  BEDTIME   OXYGEN Inhale into the lungs daily. Uses at night with CPAP and PRN during daytime   pantoprazole (PROTONIX) 40 MG tablet Take 1 tablet (40 mg total) by mouth 2 (two) times daily. Please schedule an office visit before anymore refills.   Tavaborole (KERYDIN) 5 % SOLN Apply to toenails and fingernails at bedtime daily   traZODone (DESYREL) 150 MG tablet Take 1 tablet (150 mg total) by mouth at bedtime. Please schedule an office visit before anymore refills.   No facility-administered encounter medications on file as of 09/28/2021.   Lynann Bologna, CPA/CMA Clinical Pharmacist Assistant Phone: [phone]

## 2021-10-01 ENCOUNTER — Telehealth: Payer: Self-pay | Admitting: Family Medicine

## 2021-10-01 MED ORDER — ALBUTEROL SULFATE (2.5 MG/3ML) 0.083% IN NEBU: 2.5000 mg | INHALATION_SOLUTION | RESPIRATORY_TRACT | 0 refills | Status: DC | PRN

## 2021-10-01 NOTE — Telephone Encounter (Signed)
[facility] faxed refill request for the following medications:   albuterol (PROVENTIL) (2.5 MG/3ML) 0.083% nebulizer solution   Please advise.

## 2021-10-04 ENCOUNTER — Other Ambulatory Visit: Payer: Self-pay | Admitting: Family Medicine

## 2021-10-04 DIAGNOSIS — E7800 Pure hypercholesterolemia, unspecified: Secondary | ICD-10-CM

## 2021-10-05 NOTE — Telephone Encounter (Signed)
Requested medications are due for refill today yes (mail order)  Requested medications are on the active medication list yes  Last refill 08/10/21 (mail order)  Last visit 11/2020  Future visit scheduled 10/12/21  Notes to clinic Has already had a curtesy refill, there is an upcoming appointment scheduled 10/12/21. Labs are also past refill threshold. Mail order so I did not want to give 30 day supply until appt. Please assess.  Requested Prescriptions  Pending Prescriptions Disp Refills   hydrochlorothiazide (HYDRODIURIL) 25 MG tablet [Pharmacy Med Name: hydroCHLOROthiazide 25 MG Oral Tablet] 90 tablet 3    Sig: TAKE 1 TABLET BY MOUTH  DAILY     Cardiovascular: Diuretics - Thiazide Failed - 10/04/2021 10:09 PM      Failed - Cr in normal range and within 360 days    Creatinine  Date Value Ref Range Status  12/24/2012 1.36 (H) 0.60 - 1.30 mg/dL Final   Creatinine, Ser  Date Value Ref Range Status  11/11/2020 1.83 (H) 0.44 - 1.00 mg/dL Final          Failed - Last BP in normal range    BP Readings from Last 1 Encounters:  09/06/21 (!) 162/96          Failed - Valid encounter within last 6 months    Recent Outpatient Visits           9 months ago Pedal edema   [facility] Jerrol Banana., MD   10 months ago Essential (primary) hypertension   [facility] Carles Collet M, PA-C   11 months ago Essential (primary) hypertension   [facility], Vickki Muff, PA-C   1 year ago Annual physical exam   [facility] Jerrol Banana., MD   1 year ago Essential (primary) hypertension   [facility] Jerrol Banana., MD       Future Appointments             In 1 week Jerrol Banana., MD [facility]            Passed - Ca in normal range and within 360 days    Calcium  Date Value Ref Range Status  11/11/2020 8.9 8.9 - 10.3 mg/dL Final   Calcium,  Total  Date Value Ref Range Status  12/24/2012 9.6 8.5 - 10.1 mg/dL Final          Passed - K in normal range and within 360 days    Potassium  Date Value Ref Range Status  11/11/2020 4.1 3.5 - 5.1 mmol/L Final  12/24/2012 3.9 3.5 - 5.1 mmol/L Final          Passed - Na in normal range and within 360 days    Sodium  Date Value Ref Range Status  11/11/2020 141 135 - 145 mmol/L Final  08/11/2020 142 134 - 144 mmol/L Final  12/24/2012 141 136 - 145 mmol/L Final           pantoprazole (PROTONIX) 40 MG tablet [Pharmacy Med Name: Pantoprazole Sodium 40 MG Oral Tablet Delayed Release] 180 tablet 3    Sig: TAKE 1 TABLET BY MOUTH  TWICE DAILY     Gastroenterology: Proton Pump Inhibitors Passed - 10/04/2021 10:09 PM      Passed - Valid encounter within last 12 months    Recent Outpatient Visits           9 months ago Pedal edema  [facility] Randy, Retia Passe., MD   10 months ago Essential (primary) hypertension   [facility] Carles Collet M, [place]   11 months ago Essential (primary) hypertension   [facility], Vickki Muff, PA-C   1 year ago Annual physical exam   [facility] Jerrol Banana., MD   1 year ago Essential (primary) hypertension   [facility] Jerrol Banana., MD       Future Appointments             In 1 week Jerrol Banana., MD [facility]             ezetimibe (ZETIA) 10 MG tablet [Pharmacy Med Name: Ezetimibe 10 MG Oral Tablet] 90 tablet 3    Sig: TAKE 1 TABLET BY MOUTH  DAILY     Cardiovascular:  Antilipid - Sterol Transport Inhibitors Failed - 10/04/2021 10:09 PM      Failed - Total Cholesterol in normal range and within 360 days    Cholesterol, Total  Date Value Ref Range Status  08/11/2020 232 (H) 100 - 199 mg/dL Final   Cholesterol  Date Value Ref Range Status  12/24/2012 230 (H) 0 - 200 mg/dL Final           Failed - LDL in normal range and within 360 days    Ldl Cholesterol, Calc  Date Value Ref Range Status  12/24/2012 132 (H) 0 - 100 mg/dL Final   LDL Chol Calc (NIH)  Date Value Ref Range Status  08/11/2020 148 (H) 0 - 99 mg/dL Final          Failed - HDL in normal range and within 360 days    HDL Cholesterol  Date Value Ref Range Status  12/24/2012 42 40 - 60 mg/dL Final   HDL  Date Value Ref Range Status  08/11/2020 52 >39 mg/dL Final          Failed - Triglycerides in normal range and within 360 days    Triglycerides  Date Value Ref Range Status  08/11/2020 180 (H) 0 - 149 mg/dL Final  12/24/2012 279 (H) 0 - 200 mg/dL Final          Passed - Valid encounter within last 12 months    Recent Outpatient Visits           9 months ago Pedal edema   [facility] Jerrol Banana., MD   10 months ago Essential (primary) hypertension   [facility] Carles Collet M, [place]   11 months ago Essential (primary) hypertension   [facility], Vickki Muff, PA-C   1 year ago Annual physical exam   [facility] Jerrol Banana., MD   1 year ago Essential (primary) hypertension   [facility] Jerrol Banana., MD       Future Appointments             In 1 week Jerrol Banana., MD [facility]             traZODone (DESYREL) 150 MG tablet [Pharmacy Med Name: traZODone HCl 150 MG Oral Tablet] 90 tablet 3    Sig: TAKE 1 TABLET BY MOUTH AT  BEDTIME     Psychiatry: Antidepressants - Serotonin Modulator Failed - 10/04/2021 10:09 PM      Failed - Valid encounter within last 6 months  Recent Outpatient Visits           9 months ago Pedal edema   [facility] Jerrol Banana., MD   10 months ago Essential (primary) hypertension   [facility] Carles Collet M, [place]   11 months ago Essential (primary) hypertension    [facility], Vickki Muff, PA-C   1 year ago Annual physical exam   [facility] Jerrol Banana., MD   1 year ago Essential (primary) hypertension   [facility] Jerrol Banana., MD       Future Appointments             In 1 week Jerrol Banana., MD [facility]            Passed - Completed PHQ-2 or PHQ-9 in the last 360 days       citalopram (CELEXA) 10 MG tablet [Pharmacy Med Name: Citalopram Hydrobromide 10 MG Oral Tablet] 90 tablet 3    Sig: TAKE 1 TABLET BY MOUTH AT  BEDTIME     Psychiatry:  Antidepressants - SSRI Failed - 10/04/2021 10:09 PM      Failed - Valid encounter within last 6 months    Recent Outpatient Visits           9 months ago Pedal edema   [facility] Jerrol Banana., MD   10 months ago Essential (primary) hypertension   [facility] Carles Collet M, [place]   11 months ago Essential (primary) hypertension   [facility], Vickki Muff, PA-C   1 year ago Annual physical exam   [facility] Jerrol Banana., MD   1 year ago Essential (primary) hypertension   [facility] Jerrol Banana., MD       Future Appointments             In 1 week Jerrol Banana., MD [facility]            Passed - Completed PHQ-2 or PHQ-9 in the last 360 days

## 2021-10-12 ENCOUNTER — Other Ambulatory Visit: Payer: Self-pay

## 2021-10-12 ENCOUNTER — Encounter: Payer: Self-pay | Admitting: Family Medicine

## 2021-10-12 ENCOUNTER — Ambulatory Visit (INDEPENDENT_AMBULATORY_CARE_PROVIDER_SITE_OTHER): Payer: Medicare Other | Admitting: Family Medicine

## 2021-10-12 VITALS — BP 140/70 | HR 71 | Temp 98.1°F | Resp 18 | Ht 61.0 in | Wt 162.0 lb

## 2021-10-12 DIAGNOSIS — J438 Other emphysema: Secondary | ICD-10-CM

## 2021-10-12 DIAGNOSIS — I10 Essential (primary) hypertension: Secondary | ICD-10-CM | POA: Diagnosis not present

## 2021-10-12 DIAGNOSIS — Z720 Tobacco use: Secondary | ICD-10-CM

## 2021-10-12 DIAGNOSIS — D329 Benign neoplasm of meninges, unspecified: Secondary | ICD-10-CM

## 2021-10-12 DIAGNOSIS — E7800 Pure hypercholesterolemia, unspecified: Secondary | ICD-10-CM | POA: Diagnosis not present

## 2021-10-12 DIAGNOSIS — Z950 Presence of cardiac pacemaker: Secondary | ICD-10-CM

## 2021-10-12 DIAGNOSIS — K22719 Barrett's esophagus with dysplasia, unspecified: Secondary | ICD-10-CM

## 2021-10-12 DIAGNOSIS — Z23 Encounter for immunization: Secondary | ICD-10-CM | POA: Diagnosis not present

## 2021-10-12 DIAGNOSIS — M810 Age-related osteoporosis without current pathological fracture: Secondary | ICD-10-CM

## 2021-10-12 DIAGNOSIS — J9611 Chronic respiratory failure with hypoxia: Secondary | ICD-10-CM

## 2021-10-12 DIAGNOSIS — F324 Major depressive disorder, single episode, in partial remission: Secondary | ICD-10-CM

## 2021-10-12 DIAGNOSIS — R918 Other nonspecific abnormal finding of lung field: Secondary | ICD-10-CM

## 2021-10-12 DIAGNOSIS — J441 Chronic obstructive pulmonary disease with (acute) exacerbation: Secondary | ICD-10-CM

## 2021-10-12 NOTE — Progress Notes (Signed)
I,Crystal White,acting as a scribe for Crystal Durie, MD.,have documented all relevant documentation on the behalf of Crystal Durie, MD,as directed by  Crystal Durie, MD while in the presence of Crystal Durie, MD.   Established patient visit   Patient: Crystal White   DOB: 07-Dec-1940   80 y.o. Female  MRN: [id number] Visit Date: 10/12/2021  Today's healthcare provider: [name], MD   Chief Complaint  Patient presents with   Follow-up   Hypertension   Subjective    HPI  Today for follow-up.  Smoking about a pack of cigarettes per day.  She is on home O2 and especially uses it at night. Her problems are stable and she is taking her medications as prescribed.  Needs lab work done. States her home blood pressure today was 136/70. Hypertension, follow-up  BP Readings from Last 3 Encounters:  10/12/21 140/70  09/06/21 (!) 162/96  04/02/21 (!) 170/84   Wt Readings from Last 3 Encounters:  10/12/21 162 lb (73.5 kg)  04/02/21 152 lb 6.4 oz (69.1 kg)  02/02/21 140 lb (63.5 kg)     She was last seen for hypertension 11 months ago.  BP at that visit was 191/72. Management since that visit includes; Started amlodipine 10 mg qd. Recommend follow up next week if BP is still not improved. Follow up with PCP in 1 month.  She reports good compliance with treatment. She is not having side effects. none She is not exercising. She is not adherent to low salt diet.   Outside blood pressures are 140/70.  She does not smoke.  Use of agents associated with hypertension: none.   ---------------------------------------------------------------------------------------------------     Medications: Outpatient Medications Prior to Visit  Medication Sig   acetaminophen (TYLENOL) 500 MG tablet Take 1,000 mg by mouth every 6 (six) hours as needed (pain/headaches.).    albuterol (PROVENTIL HFA) 108 (90 Base) MCG/ACT inhaler Inhale 2 puffs into the lungs every 6  (six) hours as needed for wheezing or shortness of breath.   albuterol (PROVENTIL) (2.5 MG/3ML) 0.083% nebulizer solution Take 3 mLs (2.5 mg total) by nebulization every 4 (four) hours as needed for wheezing or shortness of breath.   Budeson-Glycopyrrol-Formoterol (BREZTRI AEROSPHERE) 160-9-4.8 MCG/ACT AERO Inhale 2 puffs into the lungs in the morning and at bedtime.   citalopram (CELEXA) 10 MG tablet TAKE 1 TABLET BY MOUTH AT  BEDTIME   ezetimibe (ZETIA) 10 MG tablet TAKE 1 TABLET BY MOUTH  DAILY   gabapentin (NEURONTIN) 100 MG capsule TAKE 2 CAPSULES BY MOUTH 3  TIMES DAILY   hydrochlorothiazide (HYDRODIURIL) 25 MG tablet TAKE 1 TABLET BY MOUTH  DAILY   losartan (COZAAR) 100 MG tablet TAKE 1 TABLET(100 MG) BY MOUTH DAILY   mirtazapine (REMERON) 30 MG tablet TAKE 1 TABLET BY MOUTH AT  BEDTIME   OXYGEN Inhale into the lungs daily. Uses at night with CPAP and PRN during daytime   pantoprazole (PROTONIX) 40 MG tablet TAKE 1 TABLET BY MOUTH  TWICE DAILY   traZODone (DESYREL) 150 MG tablet TAKE 1 TABLET BY MOUTH AT  BEDTIME   [DISCONTINUED] Tavaborole (KERYDIN) 5 % SOLN Apply to toenails and fingernails at bedtime daily (Patient not taking: Reported on 10/12/2021)   No facility-administered medications prior to visit.    Review of Systems  Constitutional:  Negative for appetite change, chills, fatigue and fever.  Respiratory:  Negative for chest tightness and shortness of breath.   Cardiovascular:  Negative  for chest pain and palpitations.  Gastrointestinal:  Negative for abdominal pain, nausea and vomiting.  Neurological:  Negative for dizziness and weakness.       Objective    BP 140/70 (BP Location: Right Arm, Patient Position: Sitting, Cuff Size: Normal)   Pulse 71   Temp 98.1 F (36.7 C) (Temporal)   Resp 18   Ht 5\' 1"  (1.549 m)   Wt 162 lb (73.5 kg)   SpO2 94%   BMI 30.61 kg/m  BP Readings from Last 3 Encounters:  10/12/21 140/70  09/06/21 (!) 162/96  04/02/21 (!) 170/84    Wt Readings from Last 3 Encounters:  10/12/21 162 lb (73.5 kg)  04/02/21 152 lb 6.4 oz (69.1 kg)  02/02/21 140 lb (63.5 kg)      Physical Exam Vitals reviewed.  Constitutional:      Appearance: Normal appearance.  Cardiovascular:     Rate and Rhythm: Normal rate and regular rhythm.     Heart sounds: Normal heart sounds.  Pulmonary:     Effort: Pulmonary effort is normal.     Breath sounds: Normal breath sounds.  Musculoskeletal:     Right lower leg: No edema.     Left lower leg: No edema.  Skin:    General: Skin is warm and dry.  Neurological:     General: No focal deficit present.     Mental Status: She is alert and oriented to person, place, and time. Mental status is at baseline.  Psychiatric:        Mood and Affect: Mood normal.        Behavior: Behavior normal.      No results found for any visits on 10/12/21.  Assessment & Plan     1. Essential (primary) hypertension Controlled on CTZ and losartan - CBC w/Diff/Platelet - Comprehensive Metabolic Panel (CMET) - TSH - Lipid panel  2. Need for influenza vaccination  - Flu Vaccine QUAD High Dose(Fluad) - CBC w/Diff/Platelet - Comprehensive Metabolic Panel (CMET) - TSH - Lipid panel  3. COPD exacerbation ([place]) Patient stable on breast tree albuterol Patient is quit smoking. - CBC w/Diff/Platelet - Comprehensive Metabolic Panel (CMET) - TSH - Lipid panel  4. Hypercholesteremia Patient states she is statin intolerant due to myopathies and is on Zetia - CBC w/Diff/Platelet - Comprehensive Metabolic Panel (CMET) - TSH - Lipid panel  5. Depression, major, single episode, in partial remission (HCC) Stable on Celexa 10 - CBC w/Diff/Platelet - Comprehensive Metabolic Panel (CMET) - TSH - Lipid panel  6. Osteoporosis, unspecified osteoporosis type, unspecified pathological fracture presence  - CBC w/Diff/Platelet - Comprehensive Metabolic Panel (CMET) - TSH - Lipid panel  7. Other emphysema  ([place])   8. Chronic respiratory failure with hypoxia (HCC) O2 per pulmonology.  9. Barrett's esophagus with dysplasia Lifelong PPI resolved twice daily  10. Tobacco abuse  advised to quit smoking  11. Pacemaker   12. Multiple pulmonary nodules   13. Major depressive disorder with single episode, in partial remission ([place]) Able on citalopram  14. Meningioma ([place])  on CT head number of 21   Return in about 6 months (around 04/11/2022).      I, Crystal Durie, MD, have reviewed all documentation for this visit. The documentation on 10/15/21 for the exam, diagnosis, procedures, and orders are all accurate and complete.    Nathanal Hermiz Cranford Mon, MD  [facility] [phone] (phone) [phone] (fax)  [employer]

## 2021-10-12 NOTE — Patient Instructions (Addendum)
QUIT SMOKING

## 2021-10-19 ENCOUNTER — Telehealth: Payer: Self-pay

## 2021-10-19 DIAGNOSIS — E7800 Pure hypercholesterolemia, unspecified: Secondary | ICD-10-CM

## 2021-10-19 DIAGNOSIS — I10 Essential (primary) hypertension: Secondary | ICD-10-CM

## 2021-10-19 DIAGNOSIS — J441 Chronic obstructive pulmonary disease with (acute) exacerbation: Secondary | ICD-10-CM

## 2021-10-19 MED ORDER — LOSARTAN POTASSIUM 100 MG PO TABS: ORAL_TABLET | ORAL | 1 refills | Status: DC

## 2021-10-19 MED ORDER — CITALOPRAM HYDROBROMIDE 10 MG PO TABS: 10.0000 mg | ORAL_TABLET | Freq: Every day | ORAL | 0 refills | Status: DC

## 2021-10-19 MED ORDER — GABAPENTIN 100 MG PO CAPS: ORAL_CAPSULE | ORAL | 3 refills | Status: DC

## 2021-10-19 MED ORDER — EZETIMIBE 10 MG PO TABS: 10.0000 mg | ORAL_TABLET | Freq: Every day | ORAL | 0 refills | Status: DC

## 2021-10-19 MED ORDER — MIRTAZAPINE 30 MG PO TABS: 30.0000 mg | ORAL_TABLET | Freq: Every day | ORAL | 3 refills | Status: DC

## 2021-10-19 MED ORDER — PANTOPRAZOLE SODIUM 40 MG PO TBEC: 40.0000 mg | DELAYED_RELEASE_TABLET | Freq: Two times a day (BID) | ORAL | 1 refills | Status: DC

## 2021-10-19 MED ORDER — HYDROCHLOROTHIAZIDE 25 MG PO TABS: 25.0000 mg | ORAL_TABLET | Freq: Every day | ORAL | 3 refills | Status: DC

## 2021-10-19 MED ORDER — BREZTRI AEROSPHERE 160-9-4.8 MCG/ACT IN AERO: 2.0000 | INHALATION_SPRAY | Freq: Two times a day (BID) | RESPIRATORY_TRACT | 11 refills | Status: DC

## 2021-10-19 MED ORDER — BREZTRI AEROSPHERE 160-9-4.8 MCG/ACT IN AERO
2.0000 | INHALATION_SPRAY | Freq: Two times a day (BID) | RESPIRATORY_TRACT | 11 refills | Status: DC
Start: 2021-10-19 — End: 2024-09-02

## 2021-10-19 MED ORDER — ALBUTEROL SULFATE HFA 108 (90 BASE) MCG/ACT IN AERS: 2.0000 | INHALATION_SPRAY | Freq: Four times a day (QID) | RESPIRATORY_TRACT | 3 refills | Status: AC | PRN

## 2021-10-19 MED ORDER — TRAZODONE HCL 150 MG PO TABS: 150.0000 mg | ORAL_TABLET | Freq: Every day | ORAL | 1 refills | Status: DC

## 2021-10-19 NOTE — Telephone Encounter (Signed)
Verbal consent obtained for [facility] enhanced pharmacy services (medication synchronization, adherence packaging, delivery coordination). A medication sync plan was created to allow patient to get all medications delivered once every 30 to 90 days per patient preference. Patient understands they have freedom to choose pharmacy and clinical pharmacist will coordinate care between all prescribers and [facility].  Refills sent into [facility] for delivery.  [name], PharmD, [place], CPP  Clinical Pharmacist Practitioner  [facility] [phone]

## 2021-10-20 LAB — COMPREHENSIVE METABOLIC PANEL
ALT: 7 IU/L (ref 0–32)
AST: 12 IU/L (ref 0–40)
Albumin/Globulin Ratio: 1.6 (ref 1.2–2.2)
Albumin: 3.9 g/dL (ref 3.7–4.7)
Alkaline Phosphatase: 86 IU/L (ref 44–121)
BUN/Creatinine Ratio: 9 — ABNORMAL LOW (ref 12–28)
BUN: 16 mg/dL (ref 8–27)
Bilirubin Total: 0.4 mg/dL (ref 0.0–1.2)
CO2: 25 mmol/L (ref 20–29)
Calcium: 9 mg/dL (ref 8.7–10.3)
Chloride: 104 mmol/L (ref 96–106)
Creatinine, Ser: 1.69 mg/dL — ABNORMAL HIGH (ref 0.57–1.00)
Globulin, Total: 2.5 g/dL (ref 1.5–4.5)
Glucose: 100 mg/dL — ABNORMAL HIGH (ref 70–99)
Potassium: 4 mmol/L (ref 3.5–5.2)
Sodium: 144 mmol/L (ref 134–144)
Total Protein: 6.4 g/dL (ref 6.0–8.5)
eGFR: 31 mL/min/{1.73_m2} — ABNORMAL LOW (ref >59)

## 2021-10-20 LAB — LIPID PANEL
Chol/HDL Ratio: 3.6 ratio (ref 0.0–4.4)
Cholesterol, Total: 216 mg/dL — ABNORMAL HIGH (ref 100–199)
HDL: 60 mg/dL (ref >39)
LDL Chol Calc (NIH): 126 mg/dL — ABNORMAL HIGH (ref 0–99)
Triglycerides: 169 mg/dL — ABNORMAL HIGH (ref 0–149)
VLDL Cholesterol Cal: 30 mg/dL (ref 5–40)

## 2021-10-20 LAB — CBC WITH DIFFERENTIAL/PLATELET
Basophils Absolute: 0.1 10*3/uL (ref 0.0–0.2)
Basos: 1 %
EOS (ABSOLUTE): 0.2 10*3/uL (ref 0.0–0.4)
Eos: 2 %
Hematocrit: 44.5 % (ref 34.0–46.6)
Hemoglobin: 14.8 g/dL (ref 11.1–15.9)
Immature Grans (Abs): 0 10*3/uL (ref 0.0–0.1)
Immature Granulocytes: 0 %
Lymphocytes Absolute: 1.5 10*3/uL (ref 0.7–3.1)
Lymphs: 19 %
MCH: 29.8 pg (ref 26.6–33.0)
MCHC: 33.3 g/dL (ref 31.5–35.7)
MCV: 90 fL (ref 79–97)
Monocytes Absolute: 0.6 10*3/uL (ref 0.1–0.9)
Monocytes: 8 %
Neutrophils Absolute: 5.5 10*3/uL (ref 1.4–7.0)
Neutrophils: 70 %
Platelets: 229 10*3/uL (ref 150–450)
RBC: 4.97 x10E6/uL (ref 3.77–5.28)
RDW: 13.4 % (ref 11.7–15.4)
WBC: 7.9 10*3/uL (ref 3.4–10.8)

## 2021-10-20 LAB — TSH: TSH: 1.67 u[IU]/mL (ref 0.450–4.500)

## 2021-11-09 ENCOUNTER — Telehealth: Payer: Self-pay

## 2021-11-09 NOTE — Telephone Encounter (Signed)
°  Care Management   Follow Up Note   11/09/2021 Name: Crystal White MRN: [id number] DOB: [birth year]   Primary Care Provider: Jerrol Banana., MD Reason for referral : Chronic Care Management   An unsuccessful telephone outreach was attempted today. The patient was referred to the case management team for assistance with care management and care coordination.    Follow Up Plan:  Unable to leave a voice message today due to patient's voice mailbox being full. A member of the care management team will follow up within the next two weeks.   [name] [facility] Care Management [facility] [phone]

## 2021-11-15 ENCOUNTER — Telehealth: Payer: Self-pay

## 2021-11-15 NOTE — Progress Notes (Signed)
Chronic Care Management Pharmacy Assistant   Name: Crystal White  MRN: [id number] DOB: August 25, 1941  Reason for Encounter: Hypertension Disease State Call/Call [facility] check Rx Status   Recent office visits:  10/12/2021 [name], MD (PCP Office Visit) for Follow-up- Stopped: Tavaborole 5%, lab orders placed, patient instructed to follow-up in 6 months  Recent consult visits:  None ID  Hospital visits:  None in previous 6 months  Medications: Outpatient Encounter Medications as of 11/15/2021  Medication Sig   acetaminophen (TYLENOL) 500 MG tablet Take 1,000 mg by mouth every 6 (six) hours as needed (pain/headaches.).    albuterol (PROVENTIL HFA) 108 (90 Base) MCG/ACT inhaler Inhale 2 puffs into the lungs every 6 (six) hours as needed for wheezing or shortness of breath.   albuterol (PROVENTIL) (2.5 MG/3ML) 0.083% nebulizer solution Take 3 mLs (2.5 mg total) by nebulization every 4 (four) hours as needed for wheezing or shortness of breath.   Budeson-Glycopyrrol-Formoterol (BREZTRI AEROSPHERE) 160-9-4.8 MCG/ACT AERO Inhale 2 puffs into the lungs in the morning and at bedtime.   citalopram (CELEXA) 10 MG tablet Take 1 tablet (10 mg total) by mouth at bedtime.   ezetimibe (ZETIA) 10 MG tablet Take 1 tablet (10 mg total) by mouth daily.   gabapentin (NEURONTIN) 100 MG capsule TAKE 2 CAPSULES BY MOUTH 3  TIMES DAILY   hydrochlorothiazide (HYDRODIURIL) 25 MG tablet Take 1 tablet (25 mg total) by mouth daily.   losartan (COZAAR) 100 MG tablet TAKE 1 TABLET(100 MG) BY MOUTH DAILY   mirtazapine (REMERON) 30 MG tablet Take 1 tablet (30 mg total) by mouth at bedtime.   OXYGEN Inhale into the lungs daily. Uses at night with CPAP and PRN during daytime   pantoprazole (PROTONIX) 40 MG tablet Take 1 tablet (40 mg total) by mouth 2 (two) times daily.   traZODone (DESYREL) 150 MG tablet Take 1 tablet (150 mg total) by mouth at bedtime.   No facility-administered encounter medications on file  as of 11/15/2021.   Care Gaps: Hepatitis C Screening COVID-19 Vaccine Zoster Vaccines  Star Rating Drugs: Losartan 100 mg last filled on 08/26/2021 for a 90-Day supply with [facility]  Reviewed chart prior to disease state call. Spoke with patient regarding BP  Recent Office Vitals: BP Readings from Last 3 Encounters:  10/12/21 140/70  09/06/21 (!) 162/96  04/02/21 (!) 170/84   Pulse Readings from Last 3 Encounters:  10/12/21 71  09/06/21 83  04/02/21 78    Wt Readings from Last 3 Encounters:  10/12/21 162 lb (73.5 kg)  04/02/21 152 lb 6.4 oz (69.1 kg)  02/02/21 140 lb (63.5 kg)     Kidney Function Lab Results  Component Value Date/Time   CREATININE 1.69 (H) 10/19/2021 08:58 AM   CREATININE 1.83 (H) 11/11/2020 01:31 PM   CREATININE 1.36 (H) 12/24/2012 11:08 AM   CREATININE 1.02 07/24/2012 09:29 AM   GFRNONAA 28 (L) 11/11/2020 01:31 PM   GFRNONAA 39 (L) 12/24/2012 11:08 AM   GFRAA 31 (L) 08/11/2020 10:46 AM   GFRAA 46 (L) 12/24/2012 11:08 AM    BMP Latest Ref Rng & Units 10/19/2021 11/11/2020 08/11/2020  Glucose 70 - 99 mg/dL 100(H) 98 86  BUN 8 - 27 mg/dL 16 40(H) 34(H)  Creatinine 0.57 - 1.00 mg/dL 1.69(H) 1.83(H) 1.78(H)  BUN/Creat Ratio 12 - 28 9(L) - 19  Sodium 134 - 144 mmol/L 144 141 142  Potassium 3.5 - 5.2 mmol/L 4.0 4.1 4.1  Chloride 96 - 106 mmol/L 104  102 101  CO2 20 - 29 mmol/L 25 28 24   Calcium 8.7 - 10.3 mg/dL 9.0 8.9 9.0    Current antihypertensive regimen:  HCTZ 25 mg 1 tablet daily Losartan 100 mg 1 tablet daily  How often are you checking your Blood Pressure? infrequently  Current home BP readings: 140/78  What recent interventions/DTPs have been made by any provider to improve Blood Pressure control since last CPP Visit: None ID  Any recent hospitalizations or ED visits since last visit with CPP? No  What diet changes have been made to improve Blood Pressure Control?  Patient is not on a certain diet she just eats a normal  diet  What exercise is being done to improve your Blood Pressure Control?  Patient does not exercise  Adherence Review: Is the patient currently on ACE/ARB medication? Yes Does the patient have >5 day gap between last estimated fill dates? No  Patient reports that she is doing well. Patient denies any ill symptoms at this time. Patient reports that she takes all medications as directed with no issues, and no side effects. Patient only has issues with her breathing if she goes outside in the cold weather. Patient reports that her blood pressure has been good. She denies any hypertension symptoms. Patient is getting her Judithann Sauger from [facility] patient assistant program with no issues so far. I did advise her I was going to contact them regarding how many refills she has left. Dr. Patsey Berthold is the prescribing provider, and I am unsure how many refills she she authorized for patient's medication.   I contacted [facility], and spoke with the representative who advised that the patient has 2 90-Day refills left. She was just shipped out a 90-Day supply on 12/06 that should be arriving to the patient's home today. Patient should be good until August   Patient has a scheduled telephone appointment with [name], CPP on 12/13/2021 @ [place], CPA/CMA Catering manager Phone: [phone]

## 2021-11-25 ENCOUNTER — Telehealth: Payer: Self-pay

## 2021-11-25 NOTE — Telephone Encounter (Signed)
°  Care Management   Follow Up Note   11/25/2021 Name: Crystal White MRN: [id number] DOB: May 04, 1941   Primary Care Provider: Jerrol Banana., MD Reason for referral : Chronic Care Management   An unsuccessful telephone outreach was attempted today. The patient was referred to the case management team for assistance with care management and care coordination.    Follow Up Plan:  Unable to leave a voice message due to voice mailbox being full. A care team member will contact Ms. [name] to schedule a telephonic visit.   [name] [facility] Care Management [facility] [phone]

## 2021-12-13 ENCOUNTER — Telehealth: Payer: Medicare Other

## 2021-12-13 ENCOUNTER — Ambulatory Visit (INDEPENDENT_AMBULATORY_CARE_PROVIDER_SITE_OTHER): Payer: Medicare Other

## 2021-12-13 DIAGNOSIS — I10 Essential (primary) hypertension: Secondary | ICD-10-CM

## 2021-12-13 DIAGNOSIS — E7800 Pure hypercholesterolemia, unspecified: Secondary | ICD-10-CM

## 2021-12-13 NOTE — Progress Notes (Signed)
Chronic Care Management Pharmacy Note  12/13/2021 Name:  Crystal White MRN:  [id number] DOB:  Apr 30, 1941  Summary: Patient presents for CCM follow-up. Home blood pressures elevated. Patient resistant to medication changes today due to concern of fatigue when blood pressure is in the 120-130s.   Recommendations/Changes made from today's visit: Continue current medications  Patient to work on reducing sodium, caffeine intake and continue monitoring blood pressures.  Plan: CPP follow-up in 3 months  Subjective: Crystal White is an 81 y.o. year old female who is a primary patient of Jerrol Banana., MD.  The CCM team was consulted for assistance with disease management and care coordination needs.    Engaged with patient by telephone for follow up visit in response to provider referral for pharmacy case management and/or care coordination services.   Consent to Services:  The patient was given information about Chronic Care Management services, agreed to services, and gave verbal consent prior to initiation of services.  Please see initial visit note for detailed documentation.   Patient Care Team: Jerrol Banana., MD as PCP - General (Unknown Physician Specialty) Samara Deist, DPM as Referring Physician (Podiatry) Isaias Cowman, MD as Consulting Physician (Cardiology) Neldon Labella, RN as Case Manager Germaine Pomfret, [facility] (Pharmacist)  Recent office visits: 12/21/2020 [name], MD (PCP Video Visit) for follow-up- No orders placed at this visit, no medication changes noted  Recent consult visits: 07/21/21: Patient presented to Clabe Seal, [place] (Cardiology) for follow-up. BP 170/92. No meds changed.   04/02/2021 Vernard Gambles, MD (Pulmonology) For Follow-up- Reassess Pulmonary function testing ordered, Echocardiogram complete ordered, Trail of Breztri 2 puffs daily; Stop Breo; Oxygen order placed; Patient instructed to follow-up in 4 to 6 weeks.    02/10/2021 Isaias Cowman, MD (Cardiology) for Hospital follow-up- No medication changes noted. Patient instructed to return in 4 months   01/20/2021 Isaias Cowman, MD (Cardiology) Follow-up for Hypertension and Atrioventricular block No medication changes noted, X-ray Chest PA and lateral ordered, Labs ordered  Hospital visits: Admitted to the ED on 09/06/21 for Viral URI ith cough. Benzonatate, Fexofenadine, Guaifenesin-Codeine started.    Objective:  Lab Results  Component Value Date   CREATININE 1.69 (H) 10/19/2021   BUN 16 10/19/2021   GFRNONAA 28 (L) 11/11/2020   GFRAA 31 (L) 08/11/2020   NA 144 10/19/2021   K 4.0 10/19/2021   CALCIUM 9.0 10/19/2021   CO2 25 10/19/2021   GLUCOSE 100 (H) 10/19/2021    Lab Results  Component Value Date/Time   HGBA1C 5.5 01/24/2017 11:32 AM    Last diabetic Eye exam: No results found for: HMDIABEYEEXA  Last diabetic Foot exam: No results found for: HMDIABFOOTEX   Lab Results  Component Value Date   CHOL 216 (H) 10/19/2021   HDL 60 10/19/2021   LDLCALC 126 (H) 10/19/2021   TRIG 169 (H) 10/19/2021   CHOLHDL 3.6 10/19/2021    Hepatic Function Latest Ref Rng & Units 10/19/2021 08/11/2020 02/13/2020  Total Protein 6.0 - 8.5 g/dL 6.4 6.9 6.6  Albumin 3.7 - 4.7 g/dL 3.9 4.3 4.1  AST 0 - 40 IU/L 12 9 13   ALT 0 - 32 IU/L 7 6 7   Alk Phosphatase 44 - 121 IU/L 86 89 95  Total Bilirubin 0.0 - 1.2 mg/dL 0.4 0.3 0.3    Lab Results  Component Value Date/Time   TSH 1.670 10/19/2021 08:58 AM   TSH 1.160 08/11/2020 10:46 AM    CBC Latest Ref Rng &  Units 10/19/2021 11/11/2020 08/11/2020  WBC 3.4 - 10.8 x10E3/uL 7.9 7.6 9.4  Hemoglobin 11.1 - 15.9 g/dL 14.8 15.6(H) 15.3  Hematocrit 34.0 - 46.6 % 44.5 47.4(H) 46.5  Platelets 150 - 450 x10E3/uL 229 188 197    No results found for: VD25OH  Clinical ASCVD: No  The 10-year ASCVD risk score (Arnett DK, et al., 2019) is: 61.5%   Values used to calculate the score:     Age: 81  years     Sex: Female     Is Non-Hispanic African American: No     Diabetic: No     Tobacco smoker: Yes     Systolic Blood Pressure: 433 mmHg     Is BP treated: Yes     HDL Cholesterol: 60 mg/dL     Total Cholesterol: 216 mg/dL    Depression screen [facility] 2/9 01/12/2021 10/29/2020 04/28/2020  Decreased Interest 0 0 0  Down, Depressed, Hopeless 0 0 0  PHQ - 2 Score 0 0 0  Altered sleeping - - -  Tired, decreased energy - - -  Change in appetite - - -  Feeling bad or failure about yourself  - - -  Trouble concentrating - - -  Moving slowly or fidgety/restless - - -  Suicidal thoughts - - -  PHQ-9 Score - - -  Difficult doing work/chores - - -    Social History   Tobacco Use  Smoking Status Every Day   Packs/day: 1.00   Years: 55.00   Pack years: 55.00   Types: Cigarettes  Smokeless Tobacco Never  Tobacco Comments   Currently smoking a pack/day   BP Readings from Last 3 Encounters:  10/12/21 140/70  09/06/21 (!) 162/96  04/02/21 (!) 170/84   Pulse Readings from Last 3 Encounters:  10/12/21 71  09/06/21 83  04/02/21 78   Wt Readings from Last 3 Encounters:  10/12/21 162 lb (73.5 kg)  04/02/21 152 lb 6.4 oz (69.1 kg)  02/02/21 140 lb (63.5 kg)   BMI Readings from Last 3 Encounters:  10/12/21 30.61 kg/m  04/02/21 28.61 kg/m  02/02/21 23.66 kg/m    Assessment/Interventions: Review of patient past medical history, allergies, medications, health status, including review of consultants reports, laboratory and other test data, was performed as part of comprehensive evaluation and provision of chronic care management services.   SDOH:  (Social Determinants of Health) assessments and interventions performed: Yes SDOH Interventions    Flowsheet Row Most Recent Value  SDOH Interventions   Financial Strain Interventions Intervention Not Indicated       SDOH Screenings   Alcohol Screen: Low Risk    Last Alcohol Screening Score (AUDIT): 1  Depression (PHQ2-9): Low  Risk    PHQ-2 Score: 0  Financial Resource Strain: Low Risk    Difficulty of Paying Living Expenses: Not hard at all  Food Insecurity: No Food Insecurity   Worried About Charity fundraiser in the Last Year: Never true   Ran Out of Food in the Last Year: Never true  Housing: Not on file  Physical Activity: Not on file  Social Connections: Not on file  Stress: No Stress Concern Present   Feeling of Stress : Not at all  Tobacco Use: High Risk   Smoking Tobacco Use: Every Day   Smokeless Tobacco Use: Never   Passive Exposure: Not on file  Transportation Needs: No Transportation Needs   Lack of Transportation (Medical): No   Lack of Transportation (Non-Medical): No  CCM Care Plan  Allergies  Allergen Reactions   Iodinated Contrast Media Other (See Comments)    Burning sensation during contrast media injections. Patient states the IV contrast makes her very hot. She has had multiple CT Scans with IV contrast and states she only gets the warm sensation. This is not an allergic reaction, but is noted in her Lead chart as well. Aggie Hacker, ARRT R CT, had this discussion with her on 01/10/19 at 1130am.   Bacitracin Swelling and Rash   Statins Other (See Comments) and Rash    Reaction:  Leg cramps     Medications Reviewed Today     Reviewed by Julieta Bellini, CMA (Certified Medical Assistant) on 10/12/21 at [place] List Status: <None>   Medication Order Taking? Sig Documenting Provider Last Dose Status Informant  acetaminophen (TYLENOL) 500 MG tablet [id number] Yes Take 1,000 mg by mouth every 6 (six) hours as needed (pain/headaches.).  [provider] Taking Active Self  albuterol (PROVENTIL HFA) 108 (90 Base) MCG/ACT inhaler [id number] Yes Inhale 2 puffs into the lungs every 6 (six) hours as needed for wheezing or shortness of breath. Jerrol Banana., MD Taking Active   albuterol (PROVENTIL) (2.5 MG/3ML) 0.083% nebulizer solution [id number] Yes Take 3 mLs  (2.5 mg total) by nebulization every 4 (four) hours as needed for wheezing or shortness of breath. Jerrol Banana., MD Taking Active   Budeson-Glycopyrrol-Formoterol [facility] AEROSPHERE) 160-9-4.8 MCG/ACT Hollie Salk [id number] Yes Inhale 2 puffs into the lungs in the morning and at bedtime. Tyler Pita, MD Taking Active   citalopram (CELEXA) 10 MG tablet [id number] Yes TAKE 1 TABLET BY MOUTH AT  BEDTIME Jerrol Banana., MD Taking Active   ezetimibe (ZETIA) 10 MG tablet [id number] Yes TAKE 1 TABLET BY MOUTH  DAILY Jerrol Banana., MD Taking Active   gabapentin (NEURONTIN) 100 MG capsule [id number] Yes TAKE 2 CAPSULES BY MOUTH 3  TIMES DAILY Jerrol Banana., MD Taking Active   hydrochlorothiazide (HYDRODIURIL) 25 MG tablet [id number] Yes TAKE 1 TABLET BY MOUTH  DAILY Jerrol Banana., MD Taking Active   losartan (COZAAR) 100 MG tablet [id number] Yes TAKE 1 TABLET(100 MG) BY MOUTH DAILY Jerrol Banana., MD Taking Active   mirtazapine (REMERON) 30 MG tablet [id number] Yes TAKE 1 TABLET BY MOUTH AT  BEDTIME Jerrol Banana., MD Taking Active   OXYGEN [id number] Yes Inhale into the lungs daily. Uses at night with CPAP and PRN during daytime [provider] Taking Active Self  pantoprazole (PROTONIX) 40 MG tablet [id number] Yes TAKE 1 TABLET BY MOUTH  TWICE DAILY Jerrol Banana., MD Taking Active   traZODone (DESYREL) 150 MG tablet [id number] Yes TAKE 1 TABLET BY MOUTH AT  BEDTIME Jerrol Banana., MD Taking Active             Patient Active Problem List   Diagnosis Date Noted   Pacemaker 09/06/2021   Incisional hernia, without obstruction or gangrene 06/03/2016   Osteoporosis 02/22/2016   Musculoskeletal chest pain 07/14/2015   Chronic respiratory failure ([place]) 07/14/2015   Acute bronchitis 07/14/2015   Other emphysema ([place]) 07/14/2015   Chronic renal insufficiency 07/14/2015   Chest pain 07/13/2015   Multiple pulmonary nodules  06/02/2015   Colon polyp 05/29/2015   Hemoptysis 04/29/2015   Sprain of ankle 04/14/2015   Anxiety 04/14/2015   Barrett's esophagus 04/14/2015   Acute exacerbation of chronic obstructive airways disease ([place])  04/14/2015   Bursitis 04/14/2015   Cellulitis 04/14/2015   Claudication ([place]) 04/14/2015   Cough 04/14/2015   Deficiency, 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   Clinical depression 04/14/2015   Dizziness 04/14/2015   Dyslipidemia 04/14/2015   Essential (primary) hypertension 04/14/2015   Flank pain 04/14/2015   H/O suicide attempt 04/14/2015   Dysphonia 04/14/2015   Hypercholesteremia 04/14/2015   Cannot sleep 04/14/2015   Malaise and fatigue 04/14/2015   Mild cognitive impairment 04/14/2015   Cramp in muscle 04/14/2015   Muscle ache 04/14/2015   Neuropathy 04/14/2015   Adiposity 04/14/2015   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   Candida infection of mouth 04/14/2015   Abnormal loss of weight 04/14/2015   Decreased body weight 04/14/2015   Artificial cardiac pacemaker 10/08/2014   Apnea, sleep 10/08/2014   COPD (chronic obstructive pulmonary disease) ([place]) 02/13/2012   Tobacco abuse 02/13/2012   Sinus congestion 02/13/2012   Current tobacco use 02/13/2012   Esophagitis, reflux 08/28/2006   Abdominal pain, right lower quadrant 08/22/2005   Acute appendicitis with peritoneal abscess 08/22/2005    Immunization History  Administered Date(s) Administered   Fluad Quad(high Dose 65+) 08/11/2020, 10/12/2021   Influenza Split 09/20/2006, 09/02/2010, 09/10/2012   Influenza Whole 08/28/2012   Influenza, High Dose Seasonal PF 08/28/2014, 08/10/2015, 07/25/2016, 10/03/2017, 08/30/2018, 09/18/2019   Influenza-Unspecified 08/28/2013   Moderna SARS-COV2 Booster Vaccination 10/12/2020   Moderna Sars-Covid-2 Vaccination 01/09/2020, 02/06/2020, 05/19/2021   Pneumococcal Conjugate-13 08/28/2014   Pneumococcal Polysaccharide-23 03/07/2011   Tdap 08/28/2014   Zoster Recombinat (Shingrix)  05/19/2021   Zoster, Live 11/04/2012    Conditions to be addressed/monitored:  Hypertension, Hyperlipidemia, GERD, COPD, Depression, Anxiety, Osteoporosis, and Tobacco use  Care Plan : General Pharmacy (Adult)  Updates made by Germaine Pomfret, RPH since 12/13/2021 12:00 AM     Problem: Hypertension, Hyperlipidemia, GERD, COPD, Depression, Anxiety, Osteoporosis, and Tobacco use   Priority: High     Long-Range Goal: Patient-Specific Goal   Start Date: 06/21/2021  Expected End Date: 06/25/2022  This Visit's Progress: On track  Recent Progress: On track  Priority: High  Note:   Current Barriers:  Unable to achieve control of blood pressure  Unable to achieve control of cholesterol  Pharmacist Clinical Goal(s):  Patient will achieve control of blood pressure as evidenced by BP less than 140/90 achieve control of cholesterol as evidenced by LDL less than 100 through collaboration with PharmD and provider.   Interventions: 1:1 collaboration with Jerrol Banana., MD regarding development and update of comprehensive plan of care as evidenced by provider attestation and co-signature Inter-disciplinary care team collaboration (see longitudinal plan of care) Comprehensive medication review performed; medication list updated in electronic medical record  Hypertension (BP goal <140/90) -Uncontrolled -Current treatment: HCTZ 25 mg daily  Losartan 100 mg daily  -Medications previously tried: Amlodipine (fatigue),   -Current home readings:   159/74   147/77  155/71  174/73  140/58   145/72  -Current dietary habits: Salts food. Drinks 2 cups caffeine.  -Current exercise habits: Minimal, limited by COPD, breathlessness -Denies hypotensive/hypertensive symptoms, some fatigue when blood pressure is in the 130s.  -Patient resistant to medication changes today due to concern of fatigue when blood pressure is in the 120-130s. Patient to work on reducing sodium, caffeine intake and  continue monitoring blood pressures. -Recommended to continue current medication  Hyperlipidemia: (LDL goal < 100) -Uncontrolled -Current treatment: Ezetimibe 10 mg daily  -Medications previously tried: Statins   -Educated on Importance of limiting foods high in cholesterol; -  Recommended to continue current medication  COPD (Goal: control symptoms and prevent exacerbations) -Controlled -Current treatment  Albuterol 2 puffs every 6 hours as needed Breztri 2 puffs twice daily  -Medications previously tried: NA  -Gold Grade: Gold 2 (FEV1 50-79%) -Current COPD Classification:  B (high sx, <2 exacerbations/yr) -Pulmonary function testing: FEV1 65% (2013) -Exacerbations requiring treatment in last 6 months: Viral URI on 09/06/21 requiring ED visit.  -Patient reports consistent use of maintenance inhaler -Frequency of rescue inhaler use: <1x monthly -Counseled on When to use rescue inhaler -Recommended to continue current medication  Depression/Anxiety (Goal: Maintain stable mood) -Controlled -Current treatment: Citalopram 10 mg nightly  Mirtazapine 30 mg nightly  Trazodone 150 mg nightly  -Medications previously tried/failed: NA -7 hours sleep. Takes 3 hours to fall asleep. Does wake up multiple times, falls back asleep quickly. -PHQ9: 0 -GAD7: 0 -Recommended to continue current medication  GERD (Goal: Prevent Heartburn) -Controlled -History of Hitial hernia, Barrett's Esophagus   -Current treatment  Pantoprazole 40 mg twice daily  -Medications previously tried: NA  -Very rarely has symptoms. Has only ever taken twice daily.  -Recommended to continue current medication  Chronic Kidney Disease Stage 3b  -All medications assessed for renal dosing and appropriateness in chronic kidney disease. -Recommended to continue current medication  Patient Goals/Self-Care Activities Patient will:  - check blood pressure every other day, document, and provide at future  appointments  Follow Up Plan: Telephone follow up appointment with care management team member scheduled for:  03/07/2022 at 11:00 AM    Medication Assistance:  Breztri obtained through [place].  Enrollment ends NA  Compliance/Adherence/Medication fill history: Care Gaps: Hepatitis C Screening  Star-Rating Drugs: Losartan 100 mg last filled on 05/28/2021 for a 90-Day supply with [facility]  Patient's preferred pharmacy is:  [facility] [address] [place] [address] Phone: [phone] Fax: [phone]  [facility] - [place], [place] - [address] [place] [address] Phone: [phone] Fax: [phone]   Uses pill box? Yes Pt endorses 100% compliance  Patient decided to: Utilize [facility] for medication synchronization, packaging and delivery  Care Plan and Follow Up Patient Decision:  Patient agrees to Care Plan and Follow-up.  Plan: Telephone follow up appointment with care management team member scheduled for:  03/07/2022 at 11:00 AM  [name], PharmD, [place], [place] [phone]

## 2021-12-13 NOTE — Patient Instructions (Signed)
Visit Information It was great speaking with you today!  Please let me know if you have any questions about our visit.   Goals Addressed             This Visit's Progress    Track and Manage My Blood Pressure-Hypertension   On track    Timeframe:  Long-Range Goal Priority:  High Start Date:  06/21/2021                           Expected End Date:  06/21/2022                     Follow Up within 30 days   - check blood pressure 3 times per week    Why is this important?   You won't feel high blood pressure, but it can still hurt your blood vessels.  High blood pressure can cause heart or kidney problems. It can also cause a stroke.  Making lifestyle changes like losing a little weight or eating less salt will help.  Checking your blood pressure at home and at different times of the day can help to control blood pressure.  If the doctor prescribes medicine remember to take it the way the doctor ordered.  Call the office if you cannot afford the medicine or if there are questions about it.     Notes:         Patient Care Plan: COPD (Adult)     Problem Identified: Symptom Exacerbation (COPD)      Long-Range Goal: Symptom Exacerbation Prevented or Minimized   Start Date: 05/21/2021  Expected End Date: 09/18/2021  Priority: High  Note:   Current Barriers:  Chronic Disease Management needs and support r/t COPD self-management  Case Manager Clinical Goal(s): Over the next 120 days, patient will not require hospitalization or emergent care d/t complications r/t COPD exacerbation.   Interventions:  Collaboration with Jerrol Banana., MD regarding development and update of comprehensive plan of care as evidenced by provider attestation and co-signature Inter-disciplinary care team collaboration (see longitudinal plan of care) Reviewed medications and compliance with treatment plan. Reports taking medications as prescribed. She will run out of Breztri inhaler within the  week if mail supply is not received. Pending follow up from [facility] and Me regarding anticipated timeframe for receiving her Breztri inhalers. We discussed need to contact the Pulmonology clinic to determine if samples are available in the event the inhalers are not received. Continues to use supplemental oxygen via CPAP at 3L/min. Advised to continue assessing symptoms r/t COPD daily. Advised to notify a provider for evaluation if symptoms persist for 48 hours without improvement.  Update on 09/09/21: Mrs. Zenia Resides called regarding plan for [employer]. She was contacted and informed a sample would be available for pick up [facility]. Wanted to confirm time that the inhaler would be available. Advised of clinic hours. She will inform her grandson to report to the clinic front desk to obtain the inhaler before 5pm. Agreed to call if additional assistance is required.      Patient Self Care Activities:  Self administer medications and utilize inhalers as prescribed Monitor symptoms and follow recommended COPD Action plan Consider plan for smoking cessation Avoid known COPD "triggers" and activities that may cause overexertion. Contact provider or care management team with new questions  and concerns        Patient Care Plan: Hypertension (Adult)  Problem Identified: Hypertension (Hypertension)      Long-Range Goal: Hypertension Monitored   Start Date: 05/21/2021  Expected End Date: 09/18/2021  Priority: High  Note:   Objective:  Current Barriers:  Chronic Disease Management needs and support r/t Hypertension self management.  Case Manager Clinical Goal(s):  Over the next 120 days, patient will demonstrate improved adherence to prescribed treatment plan for hypertension management as evidenced by taking all medications as prescribed, monitoring and recording blood pressure and adhering to a heart healthy/cardiac prudent diet.  Interventions:  Collaboration with Jerrol Banana., MD regarding development and update of comprehensive plan of care as evidenced by provider attestation and co-signature Inter-disciplinary care team collaboration (see longitudinal plan of care) Discussed compliance with current treatment plan. Reviewed established BP parameters. Reports excellent compliance with medications and monitoring her BP as recommended. Reports home readings have ranged in the low 140's over 70's. Continues to monitor sodium intake. Reports doing well with maintaining a heart healthy diet. Reviewed s/sx of heart attack, stroke and worsening symptoms that require immediate medical attention.  Patient Goals/Self-Care Activities Self administer medications as prescribed Consistently monitor BP and record readings. Follow recommended heart healthy/cardiac prudent diet Contact provider or care management team with new questions and concerns as needed   Follow Up Plan:  Will follow up in two months    Patient Care Plan: Fall Risk     Problem Identified: High Risk for Falls      Long-Range Goal: Fall and  Injury Prevention Completed 05/21/2021  Start Date: 01/12/2021  Expected End Date: 05/12/2021  Priority: High  Note:   Current Barriers:  High Risk for fall related injuries d/t Osteoporosis.  Clinical Goal(s):  Over the next 120 days, patient will not experience accidental falls or require emergent care d/t fall related injuries.  Interventions:  Collaboration with Jerrol Banana., MD regarding development and update of comprehensive plan of care as evidenced by provider attestation and co-signature Inter-disciplinary care team collaboration (see longitudinal plan of care) Reviewed information regarding safety and fall prevention measures. Denies recent falls. Reports ambulating well over the past few weeks. Denies change or decline in activity tolerance. Reports using assistive devices as recommended and continues to use a wheelchair when  away from the home for prolonged periods. Very good family support. Declines need for additional assistance.   Patient Goals/Self-Care Activities -Utilize assistive device appropriately with all ambulation -Ensure pathways are clear and well lit -Change positions slowly and demonstrate self awareness at all times -Wear secure non skid footwear when ambulating -Contact provider or the care management team with questions and concerns as needed   Goal Met    Patient Care Plan: General Pharmacy (Adult)     Problem Identified: Hypertension, Hyperlipidemia, GERD, COPD, Depression, Anxiety, Osteoporosis, and Tobacco use   Priority: High     Long-Range Goal: Patient-Specific Goal   Start Date: 06/21/2021  Expected End Date: 06/25/2022  This Visit's Progress: On track  Recent Progress: On track  Priority: High  Note:   Current Barriers:  Unable to achieve control of blood pressure  Unable to achieve control of cholesterol  Pharmacist Clinical Goal(s):  Patient will achieve control of blood pressure as evidenced by BP less than 140/90 achieve control of cholesterol as evidenced by LDL less than 100 through collaboration with PharmD and provider.   Interventions: 1:1 collaboration with Jerrol Banana., MD regarding development and update of comprehensive plan of care as evidenced by provider attestation  and co-signature Inter-disciplinary care team collaboration (see longitudinal plan of care) Comprehensive medication review performed; medication list updated in electronic medical record  Hypertension (BP goal <140/90) -Uncontrolled -Current treatment: HCTZ 25 mg daily  Losartan 100 mg daily  -Medications previously tried: Amlodipine (fatigue),   -Current home readings:   159/74   147/77  155/71  174/73  140/58   145/72  -Current dietary habits: Salts food. Drinks 2 cups caffeine.  -Current exercise habits: Minimal, limited by COPD, breathlessness -Denies  hypotensive/hypertensive symptoms, some fatigue when blood pressure is in the 130s.  -Patient resistant to medication changes today due to concern of fatigue when blood pressure is in the 120-130s. Patient to work on reducing sodium, caffeine intake and continue monitoring blood pressures. -Recommended to continue current medication  Hyperlipidemia: (LDL goal < 100) -Uncontrolled -Current treatment: Ezetimibe 10 mg daily  -Medications previously tried: Statins   -Educated on Importance of limiting foods high in cholesterol; -Recommended to continue current medication  COPD (Goal: control symptoms and prevent exacerbations) -Controlled -Current treatment  Albuterol 2 puffs every 6 hours as needed Breztri 2 puffs twice daily  -Medications previously tried: NA  -Gold Grade: Gold 2 (FEV1 50-79%) -Current COPD Classification:  B (high sx, <2 exacerbations/yr) -Pulmonary function testing: FEV1 65% (2013) -Exacerbations requiring treatment in last 6 months: Viral URI on 09/06/21 requiring ED visit.  -Patient reports consistent use of maintenance inhaler -Frequency of rescue inhaler use: <1x monthly -Counseled on When to use rescue inhaler -Recommended to continue current medication  Depression/Anxiety (Goal: Maintain stable mood) -Controlled -Current treatment: Citalopram 10 mg nightly  Mirtazapine 30 mg nightly  Trazodone 150 mg nightly  -Medications previously tried/failed: NA -7 hours sleep. Takes 3 hours to fall asleep. Does wake up multiple times, falls back asleep quickly. -PHQ9: 0 -GAD7: 0 -Recommended to continue current medication  GERD (Goal: Prevent Heartburn) -Controlled -History of Hitial hernia, Barrett's Esophagus   -Current treatment  Pantoprazole 40 mg twice daily  -Medications previously tried: NA  -Very rarely has symptoms. Has only ever taken twice daily.  -Recommended to continue current medication  Chronic Kidney Disease Stage 3b  -All medications  assessed for renal dosing and appropriateness in chronic kidney disease. -Recommended to continue current medication  Patient Goals/Self-Care Activities Patient will:  - check blood pressure every other day, document, and provide at future appointments  Follow Up Plan: Telephone follow up appointment with care management team member scheduled for:  03/07/2022 at 11:00 AM    Patient agreed to services and verbal consent obtained.   Patient verbalizes understanding of instructions and care plan provided today and agrees to view in [place]. Active MyChart status confirmed with patient.    [name], PharmD, [place], CPP  Clinical Pharmacist Practitioner  [facility] [phone]

## 2021-12-28 DIAGNOSIS — I10 Essential (primary) hypertension: Secondary | ICD-10-CM

## 2021-12-28 DIAGNOSIS — E7800 Pure hypercholesterolemia, unspecified: Secondary | ICD-10-CM

## 2021-12-30 ENCOUNTER — Ambulatory Visit (INDEPENDENT_AMBULATORY_CARE_PROVIDER_SITE_OTHER): Payer: Medicare Other

## 2021-12-30 DIAGNOSIS — E7800 Pure hypercholesterolemia, unspecified: Secondary | ICD-10-CM

## 2021-12-30 DIAGNOSIS — I10 Essential (primary) hypertension: Secondary | ICD-10-CM

## 2021-12-30 DIAGNOSIS — J449 Chronic obstructive pulmonary disease, unspecified: Secondary | ICD-10-CM

## 2021-12-30 NOTE — Chronic Care Management (AMB) (Signed)
Chronic Care Management   CCM RN Visit Note  12/30/2021 Name: Crystal White MRN: [id number] DOB: 03-07-1941  Subjective: Crystal White is a 81 y.o. year old female who is a primary care patient of Jerrol Banana., MD. The care management team was consulted for assistance with disease management and care coordination needs.    Engaged with patient by telephone for follow up visit in response to provider referral for case management and care coordination services.   Consent to Services:  The patient was given information about Chronic Care Management services, agreed to services, and gave verbal consent prior to initiation of services.  Please see initial visit note for detailed documentation.   Assessment: Review of patient past medical history, allergies, medications, health status, including review of consultants reports, laboratory and other test data, was performed as part of comprehensive evaluation and provision of chronic care management services.   SDOH (Social Determinants of Health) assessments and interventions performed: No  CCM Care Plan  Allergies  Allergen Reactions   Iodinated Contrast Media Other (See Comments)    Burning sensation during contrast media injections. Patient states the IV contrast makes her very hot. She has had multiple CT Scans with IV contrast and states she only gets the warm sensation. This is not an allergic reaction, but is noted in her [place] chart as well. Aggie Hacker, ARRT R CT, had this discussion with her on 01/10/19 at 1130am.   Bacitracin Swelling and Rash   Statins Other (See Comments) and Rash    Reaction:  Leg cramps     Outpatient Encounter Medications as of 12/30/2021  Medication Sig   acetaminophen (TYLENOL) 500 MG tablet Take 1,000 mg by mouth every 6 (six) hours as needed (pain/headaches.).    albuterol (PROVENTIL HFA) 108 (90 Base) MCG/ACT inhaler Inhale 2 puffs into the lungs every 6 (six) hours as needed for  wheezing or shortness of breath.   albuterol (PROVENTIL) (2.5 MG/3ML) 0.083% nebulizer solution Take 3 mLs (2.5 mg total) by nebulization every 4 (four) hours as needed for wheezing or shortness of breath.   Budeson-Glycopyrrol-Formoterol (BREZTRI AEROSPHERE) 160-9-4.8 MCG/ACT AERO Inhale 2 puffs into the lungs in the morning and at bedtime.   citalopram (CELEXA) 10 MG tablet Take 1 tablet (10 mg total) by mouth at bedtime.   ezetimibe (ZETIA) 10 MG tablet Take 1 tablet (10 mg total) by mouth daily.   gabapentin (NEURONTIN) 100 MG capsule TAKE 2 CAPSULES BY MOUTH 3  TIMES DAILY   hydrochlorothiazide (HYDRODIURIL) 25 MG tablet Take 1 tablet (25 mg total) by mouth daily.   losartan (COZAAR) 100 MG tablet TAKE 1 TABLET(100 MG) BY MOUTH DAILY   mirtazapine (REMERON) 30 MG tablet Take 1 tablet (30 mg total) by mouth at bedtime.   OXYGEN Inhale into the lungs daily. Uses at night with CPAP and PRN during daytime   pantoprazole (PROTONIX) 40 MG tablet Take 1 tablet (40 mg total) by mouth 2 (two) times daily.   traZODone (DESYREL) 150 MG tablet Take 1 tablet (150 mg total) by mouth at bedtime.   No facility-administered encounter medications on file as of 12/30/2021.    Patient Active Problem List   Diagnosis Date Noted   Pacemaker 09/06/2021   Incisional hernia, without obstruction or gangrene 06/03/2016   Osteoporosis 02/22/2016   Musculoskeletal chest pain 07/14/2015   Chronic respiratory failure ([place]) 07/14/2015   Acute bronchitis 07/14/2015   Other emphysema ([place]) 07/14/2015   Chronic renal insufficiency 07/14/2015  Chest pain 07/13/2015   Multiple pulmonary nodules 06/02/2015   Colon polyp 05/29/2015   Hemoptysis 04/29/2015   Sprain of ankle 04/14/2015   Anxiety 04/14/2015   Barrett's esophagus 04/14/2015   Acute exacerbation of chronic obstructive airways disease ([place]) 04/14/2015   Bursitis 04/14/2015   Cellulitis 04/14/2015   Claudication ([place]) 04/14/2015   Cough 04/14/2015    Deficiency, 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   Clinical depression 04/14/2015   Dizziness 04/14/2015   Dyslipidemia 04/14/2015   Essential (primary) hypertension 04/14/2015   Flank pain 04/14/2015   H/O suicide attempt 04/14/2015   Dysphonia 04/14/2015   Hypercholesteremia 04/14/2015   Cannot sleep 04/14/2015   Malaise and fatigue 04/14/2015   Mild cognitive impairment 04/14/2015   Cramp in muscle 04/14/2015   Muscle ache 04/14/2015   Neuropathy 04/14/2015   Adiposity 04/14/2015   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   Candida infection of mouth 04/14/2015   Abnormal loss of weight 04/14/2015   Decreased body weight 04/14/2015   Artificial cardiac pacemaker 10/08/2014   Apnea, sleep 10/08/2014   COPD (chronic obstructive pulmonary disease) ([place]) 02/13/2012   Tobacco abuse 02/13/2012   Sinus congestion 02/13/2012   Current tobacco use 02/13/2012   Esophagitis, reflux 08/28/2006   Abdominal pain, right lower quadrant 08/22/2005   Acute appendicitis with peritoneal abscess 08/22/2005     Patient Care Plan: [place] Plan of Care     Problem Identified: HTN, HLD, COPD      Long-Range Goal: Disease Progression Prevented or Minimized   Start Date: 12/30/2021  Expected End Date: 03/30/2022  Priority: High  Note:   Current Barriers:  Chronic Disease Management support and education needs related to HTN, HLD, and COPD   RNCM Clinical Goal(s):  Patient will demonstrate ongoing adherence to prescribed treatment plan for HTN, HLD, and COPD through collaboration with the provider, RN Care Manager and the care team.   Interventions: 1:1 collaboration with primary care provider regarding development and update of comprehensive plan of care as evidenced by provider attestation and co-signature Inter-disciplinary care team collaboration (see longitudinal plan of care) Evaluation of current treatment plan related to  self management and patient's adherence to plan as established  by provider   COPD Interventions:   Reviewed plan for COPD management. Reports symptoms have been well controlled. Reports having needed inhalers and taking as prescribed. Reports episodes of fatigue. Denies shortness of breath. Denies changes or decline in activity tolerance. Reports currently utilizing an upright walker which has significantly improved mobility. Reviewed importance of daily self-assessment. Verbalized awareness of need to contact the clinic of the Pulmonology team if experiencing moderate symptoms for greater than 48 hours without improvement.  Advised to continue utilizing prevention strategies to reduce risk of respiratory infection.  Reviewed worsening symptoms that require immediate medical attention.    Hyperlipidemia Interventions:   Lab Results  Component Value Date   CHOL 216 (H) 10/19/2021   HDL 60 10/19/2021   LDLCALC 126 (H) 10/19/2021   TRIG 169 (H) 10/19/2021   CHOLHDL 3.6 10/19/2021    Reviewed medications. Reports taking as prescribed and tolerating regimen. Reviewed established cholesterol goals. Discussed importance of completing regular laboratory monitoring as prescribed Reviewed importance of limiting foods high in cholesterol. Advised to continue short walks with assistive device as tolerated.  Hypertension Interventions:   Last practice recorded BP readings:  BP Readings from Last 3 Encounters:  10/12/21 140/70  09/06/21 (!) 162/96  04/02/21 (!) 170/84  Most recent eGFR/CrCl:  Lab Results  Component Value  Date   EGFR 31 (L) 10/19/2021    No components found for: CRCL  Reviewed plan for hypertension management. Reviewed blood pressure parameters along with indications for notifying a provider. Reports readings have been within range. Systolic readings have ranged in the 130's. Diastolic readings have ranged in the 60's. Reviewed nutritional intake and importance of adhering to a heart healthy/cardiac prudent diet. Reports improvements  with adherence.  Encouraged to read nutrition labels, monitor sodium intake and avoid highly processed foods when possible. Reviewed s/sx of heart attack, stroke and worsening symptoms that require immediate medical attention.   Patient Goals/Self-Care Activities: Take all medications as prescribed Attend all scheduled provider appointments Call pharmacy for medication refills 3-7 days in advance of running out of medications Perform all self care activities independently Call provider office for new concerns or questions   Follow Up Plan:   Will follow up in two months.     PLAN A member of the care management team will follow up in two months.   [name] [facility] Care Management [facility] [phone]

## 2021-12-30 NOTE — Patient Instructions (Addendum)
Thank you for allowing the Chronic Care Management team to participate in your care. It was great speaking with you today!  Our next appointment is by telephone on March 22, 2022 at 3 pm.  Please call the care guide team at [phone] if you need to cancel or reschedule your appointment.    Crystal White [facility] Care Management [facility] [phone]

## 2022-01-04 ENCOUNTER — Other Ambulatory Visit: Payer: Self-pay | Admitting: Family Medicine

## 2022-01-04 DIAGNOSIS — E7800 Pure hypercholesterolemia, unspecified: Secondary | ICD-10-CM

## 2022-01-06 ENCOUNTER — Ambulatory Visit (INDEPENDENT_AMBULATORY_CARE_PROVIDER_SITE_OTHER): Payer: Medicare Other

## 2022-01-06 DIAGNOSIS — Z0000 Encounter for general adult medical examination without abnormal findings: Secondary | ICD-10-CM | POA: Diagnosis not present

## 2022-01-06 NOTE — Progress Notes (Signed)
Virtual Visit via Telephone Note  I connected with  Crystal White on 01/06/22 at  2:20 PM EST by telephone and verified that I am speaking with the correct person using two identifiers.  Location: Patient: home Provider: [facility] Persons participating in the virtual visit: [place]   I discussed the limitations, risks, security and privacy concerns of performing an evaluation and management service by telephone and the availability of in person appointments. The patient expressed understanding and agreed to proceed.  Interactive audio and video telecommunications were attempted between this nurse and patient, however failed, due to patient having technical difficulties OR patient did not have access to video capability.  We continued and completed visit with audio only.  Some vital signs may be absent or patient reported.   Dionisio David, LPN  Subjective:   Crystal White is a 81 y.o. female who presents for Medicare Annual (Subsequent) preventive examination.  Review of Systems           Objective:    There were no vitals filed for this visit. There is no height or weight on file to calculate BMI.  Advanced Directives 02/02/2021 11/23/2020 11/11/2020 05/26/2020 04/28/2020 08/22/2019 08/19/2019  Does Patient Have a Medical Advance Directive? Yes No Yes No Yes Yes No  Type of Advance Directive - [place];Living will [place];Living will - [place];Living will [place];Living will -  Does patient want to make changes to medical advance directive? - - No - Patient declined - - No - Guardian declined -  Copy of Healthcare Power of Attorney in Chart? - No - copy requested - - No - copy requested - -  Would patient like information on creating a medical advance directive? - - - - - No - Patient declined -    Current Medications (verified) Outpatient Encounter Medications as of 01/06/2022   Medication Sig   acetaminophen (TYLENOL) 500 MG tablet Take 1,000 mg by mouth every 6 (six) hours as needed (pain/headaches.).    albuterol (PROVENTIL HFA) 108 (90 Base) MCG/ACT inhaler Inhale 2 puffs into the lungs every 6 (six) hours as needed for wheezing or shortness of breath.   albuterol (PROVENTIL) (2.5 MG/3ML) 0.083% nebulizer solution Take 3 mLs (2.5 mg total) by nebulization every 4 (four) hours as needed for wheezing or shortness of breath.   Budeson-Glycopyrrol-Formoterol (BREZTRI AEROSPHERE) 160-9-4.8 MCG/ACT AERO Inhale 2 puffs into the lungs in the morning and at bedtime.   citalopram (CELEXA) 10 MG tablet TAKE 1 TABLET BY MOUTH AT  BEDTIME   ezetimibe (ZETIA) 10 MG tablet TAKE 1 TABLET BY MOUTH DAILY   gabapentin (NEURONTIN) 100 MG capsule TAKE 2 CAPSULES BY MOUTH 3  TIMES DAILY   hydrochlorothiazide (HYDRODIURIL) 25 MG tablet Take 1 tablet (25 mg total) by mouth daily.   losartan (COZAAR) 100 MG tablet TAKE 1 TABLET(100 MG) BY MOUTH DAILY   mirtazapine (REMERON) 30 MG tablet Take 1 tablet (30 mg total) by mouth at bedtime.   OXYGEN Inhale into the lungs daily. Uses at night with CPAP and PRN during daytime   pantoprazole (PROTONIX) 40 MG tablet TAKE 1 TABLET BY MOUTH TWICE  DAILY   traZODone (DESYREL) 150 MG tablet TAKE 1 TABLET BY MOUTH AT  BEDTIME   No facility-administered encounter medications on file as of 01/06/2022.    Allergies (verified) Iodinated contrast media, Bacitracin, and Statins   History: Past Medical History:  Diagnosis Date  Arthritis    hands, lower back   Barrett's esophagus    COPD (chronic obstructive pulmonary disease) (HCC)    Emphysema lung (HCC)    GERD (gastroesophageal reflux disease)    Hernia of abdominal cavity    Hyperlipidemia    Hypertension    Mobitz type II atrioventricular block    OSA (obstructive sleep apnea)    on CPAP   Pacemaker    Peripheral neuropathy    Presence of permanent cardiac pacemaker 09/22/2011    Biotronik - EVIADR-T [facility])   Shortness of breath dyspnea    Stomach ulcer    Past Surgical History:  Procedure Laterality Date   APPENDECTOMY  2006   Dr. Gae Dry LIFT Bilateral 05/03/2016   Procedure: BLEPHAROPLASTY  BILATERAL UPPER EYELIDS WITH EXCESS SKIN REMOVAL BILATERAL BROW PTOSIS REPAIR BLEPHAROPTOSIS REPAIR BILATERAL EYES ;  Surgeon: Karle Starch, MD;  Location: [place];  Service: Ophthalmology;  Laterality: Bilateral;   CARDIAC CATHETERIZATION  2007   COLON SURGERY     COLONOSCOPY WITH PROPOFOL N/A 04/14/2015   Procedure: COLONOSCOPY WITH PROPOFOL;  Surgeon: Lollie Sails, MD;  Location: [facility] ENDOSCOPY;  Service: Endoscopy;  Laterality: N/A;   COLONOSCOPY WITH PROPOFOL N/A 01/24/2019   Procedure: COLONOSCOPY WITH PROPOFOL;  Surgeon: Lollie Sails, MD;  Location: [facility] ENDOSCOPY;  Service: Endoscopy;  Laterality: N/A;   COLONOSCOPY WITH PROPOFOL N/A 06/11/2019   Procedure: COLONOSCOPY WITH PROPOFOL;  Surgeon: Lollie Sails, MD;  Location: [facility] ENDOSCOPY;  Service: Endoscopy;  Laterality: N/A;   COLONOSCOPY WITH PROPOFOL N/A 11/23/2020   Procedure: COLONOSCOPY WITH PROPOFOL;  Surgeon: Lesly Rubenstein, MD;  Location: [facility] ENDOSCOPY;  Service: Endoscopy;  Laterality: N/A;   ESOPHAGOGASTRODUODENOSCOPY N/A 04/14/2015   Procedure: ESOPHAGOGASTRODUODENOSCOPY (EGD);  Surgeon: Lollie Sails, MD;  Location: [facility] ENDOSCOPY;  Service: Endoscopy;  Laterality: N/A;   ESOPHAGOGASTRODUODENOSCOPY (EGD) WITH PROPOFOL N/A 07/05/2017   Procedure: ESOPHAGOGASTRODUODENOSCOPY (EGD) WITH PROPOFOL;  Surgeon: Manya Silvas, MD;  Location: [facility] ENDOSCOPY;  Service: Endoscopy;  Laterality: N/A;   ESOPHAGOGASTRODUODENOSCOPY (EGD) WITH PROPOFOL N/A 01/24/2019   Procedure: ESOPHAGOGASTRODUODENOSCOPY (EGD) WITH PROPOFOL;  Surgeon: Lollie Sails, MD;  Location: [facility] ENDOSCOPY;  Service: Endoscopy;  Laterality: N/A;   ESOPHAGOGASTRODUODENOSCOPY (EGD) WITH PROPOFOL N/A  11/23/2020   Procedure: ESOPHAGOGASTRODUODENOSCOPY (EGD) WITH PROPOFOL;  Surgeon: Lesly Rubenstein, MD;  Location: [facility] ENDOSCOPY;  Service: Endoscopy;  Laterality: N/A;   HERNIA REPAIR     HIATAL HERNIA REPAIR  2007   INSERT / REPLACE / REMOVE PACEMAKER     LAPAROSCOPIC RIGHT HEMI COLECTOMY Right 06/22/2015   Procedure: LAPAROSCOPIC RIGHT HEMI COLECTOMY;  Surgeon: Marlyce Huge, MD;  Location: [facility] ORS;  Service: General;  Laterality: Right;   NISSEN FUNDOPLICATION  2725   OPEN REDUCTION INTERNAL FIXATION (ORIF) DISTAL RADIAL FRACTURE Left 08/22/2019   Procedure: OPEN REDUCTION INTERNAL FIXATION (ORIF) DISTAL RADIAL FRACTURE;  Surgeon: Hessie Knows, MD;  Location: [facility] ORS;  Service: Orthopedics;  Laterality: Left;   PACEMAKER INSERTION  09/22/11   [facility]  - Bi[employer]ron[employer] EVIADR-T   PPM GENERATOR CHANGEOUT N/A 02/02/2021   Procedure: PPM GENERATOR CHANGEOUT;  Surgeon: Isaias Cowman, MD;  Location: [facility];  Service: Cardiovascular;  Laterality: N/A;   TOTAL ABDOMINAL HYSTERECTOMY  1980   Family History  Adopted: Yes  Problem Relation Age of Onset   Diabetes Son    Social History   Socioeconomic History   Marital status: Widowed    Spouse name: widowed  Number of children: 2   Years of education: Not on file   Highest education level: Some college, no degree  Occupational History   Occupation: retired Scientist, research (life sciences) estate  Tobacco Use   Smoking status: Every Day    Packs/day: 1.00    Years: 55.00    Pack years: 55.00    Types: Cigarettes   Smokeless tobacco: Never   Tobacco comments:    Currently smoking a pack/day  Vaping Use   Vaping Use: Never used  Substance and Sexual Activity   Alcohol use: Yes    Alcohol/week: 0.0 standard drinks    Comment: 1/2 glass wine -rare   Drug use: No   Sexual activity: Never  Other Topics Concern   Not on file  Social History Narrative   Pt was adopted.    Lives at home by herself.   Not on home oxygen.   Social  Determinants of Health   Financial Resource Strain: Low Risk    Difficulty of Paying Living Expenses: Not hard at all  Food Insecurity: No Food Insecurity   Worried About Charity fundraiser in the Last Year: Never true   [place] in the Last Year: Never true  Transportation Needs: No Transportation Needs   Lack of Transportation (Medical): No   Lack of Transportation (Non-Medical): No  Physical Activity: Not on file  Stress: No Stress Concern Present   Feeling of Stress : Not at all  Social Connections: Not on file    Tobacco Counseling Ready to quit: Not Answered Counseling given: Not Answered Tobacco comments: Currently smoking a pack/day   Clinical Intake:  Pre-visit preparation completed: Yes  Pain : No/denies pain     Nutritional Risks: None Diabetes: No  How often do you need to have someone help you when you read instructions, pamphlets, or other written materials from your doctor or pharmacy?: 1 - Never  Diabetic?no  Interpreter Needed?: No  Information entered by :: Kirke Shaggy, LPN   Activities of Daily Living In your present state of health, do you have any difficulty performing the following activities: 02/02/2021  Hearing? N  Vision? N  Difficulty concentrating or making decisions? N  Walking or climbing stairs? Y  Dressing or bathing? N  Some recent data might be hidden    Patient Care Team: Jerrol Banana., MD as PCP - General (Unknown Physician Specialty) Samara Deist, DPM as Referring Physician (Podiatry) Isaias Cowman, MD as Consulting Physician (Cardiology) Neldon Labella, RN as Case Manager Germaine Pomfret, [facility] (Pharmacist)  Indicate any recent Medical Services you may have received from other than [facility] providers in the past year (date may be approximate).     Assessment:   This is a routine wellness examination for Crystal White.  Hearing/Vision screen No results found.  Dietary issues and exercise  activities discussed:     Goals Addressed   None    Depression Screen PHQ 2/9 Scores 01/12/2021 10/29/2020 04/28/2020 04/23/2019 10/19/2018 04/17/2018 04/17/2018  PHQ - 2 Score 0 0 0 0 0 0 0  PHQ- 9 Score - - - - - 9 -    Fall Risk Fall Risk  01/12/2021 10/29/2020 04/28/2020 04/23/2019 10/19/2018  Falls in the past year? 1 0 1 1 0  Number falls in past yr: 1 0 1 0 -  Injury with Fall? 1 0 1 0 -  Risk for fall due to : History of fall(s);Medication side effect;Other (Comment) - Impaired balance/gait - -  Risk for fall due to: Comment Decreased activity tolerance d/t COPD - - - -  Follow up Falls prevention discussed Falls evaluation completed Falls prevention discussed Falls prevention discussed -  Comment - - Offered PT order to build strength an assist with balance issues. Pt declined order. - -    FALL RISK PREVENTION PERTAINING TO THE HOME:  Any stairs in or around the home? Yes  If so, are there any without handrails? No  Home free of loose throw rugs in walkways, pet beds, electrical cords, etc? Yes  Adequate lighting in your home to reduce risk of falls? Yes   ASSISTIVE DEVICES UTILIZED TO PREVENT FALLS:  Life alert? No  Use of a cane, walker or w/c? Yes  Grab bars in the bathroom? Yes  Shower chair or bench in shower? Yes  Elevated toilet seat or a handicapped toilet? Yes    Cognitive Function:Normal cognitive status assessed by direct observation by this Nurse Health Advisor. No abnormalities found.       6CIT Screen 04/28/2020 04/17/2018 03/28/2017  What Year? 0 points 0 points 0 points  What month? 0 points 0 points 0 points  What time? 0 points 0 points 0 points  Count back from 20 0 points 0 points 0 points  Months in reverse 0 points 2 points 2 points  Repeat phrase 0 points 0 points 0 points  Total Score 0 2 2    Immunizations Immunization History  Administered Date(s) Administered   Fluad Quad(high Dose 65+) 08/11/2020, 10/12/2021   Influenza Split 09/20/2006,  09/02/2010, 09/10/2012   Influenza Whole 08/28/2012   Influenza, High Dose Seasonal PF 08/28/2014, 08/10/2015, 07/25/2016, 10/03/2017, 08/30/2018, 09/18/2019   Influenza-Unspecified 08/28/2013   Moderna SARS-COV2 Booster Vaccination 10/12/2020   Moderna Sars-Covid-2 Vaccination 01/09/2020, 02/06/2020, 05/19/2021   Pneumococcal Conjugate-13 08/28/2014   Pneumococcal Polysaccharide-23 03/07/2011   Tdap 08/28/2014   Zoster Recombinat (Shingrix) 05/19/2021   Zoster, Live 11/04/2012    TDAP status: Up to date  Flu Vaccine status: Up to date  Pneumococcal vaccine status: Up to date  Covid-19 vaccine status: Completed vaccines  Qualifies for Shingles Vaccine? Yes   Zostavax completed Yes   Shingrix Completed?: No.    Education has been provided regarding the importance of this vaccine. Patient has been advised to call insurance company to determine out of pocket expense if they have not yet received this vaccine. Advised may also receive vaccine at local pharmacy or Health Dept. Verbalized acceptance and understanding.  Screening Tests Health Maintenance  Topic Date Due   Hepatitis C Screening  Never done   COVID-19 Vaccine (4 - Booster for Moderna series) 07/14/2021   Zoster Vaccines- Shingrix (2 of 2) 07/14/2021   DEXA SCAN  08/26/2022   COLONOSCOPY (Pts 45-27yrs Insurance coverage will need to be confirmed)  11/24/2023   TETANUS/TDAP  08/28/2024   Pneumonia Vaccine 63+ Years old  Completed   INFLUENZA VACCINE  Completed   HPV VACCINES  Aged Out    Health Maintenance  Health Maintenance Due  Topic Date Due   Hepatitis C Screening  Never done   COVID-19 Vaccine (4 - Booster for Moderna series) 07/14/2021   Zoster Vaccines- Shingrix (2 of 2) 07/14/2021    Colorectal cancer screening: Type of screening: Colonoscopy. Completed 11/23/20. Repeat every AGED OUT years  Mammogram status: No longer required due to AGE.  Bone Density status: Completed 08/26/20. Results reflect:  Bone density results: OSTEOPOROSIS. Repeat every 2 years.- DECLINED REFERRAL  Lung Cancer Screening: (  Low Dose CT Chest recommended if Age 80-80 years, 30 pack-year currently smoking OR have quit w/in 15years.) does not qualify.    Additional Screening:  Hepatitis C Screening: does not qualify; Completed NO  Vision Screening: Recommended annual ophthalmology exams for early detection of glaucoma and other disorders of the eye. Is the patient up to date with their annual eye exam?  Yes  Who is the provider or what is the name of the office in which the patient attends annual eye exams? [facility] If pt is not established with a provider, would they like to be referred to a provider to establish care? No .   Dental Screening: Recommended annual dental exams for proper oral hygiene  Community Resource Referral / Chronic Care Management: CRR required this visit?  No   CCM required this visit?  No      Plan:     I have personally reviewed and noted the following in the patients chart:   Medical and social history Use of alcohol, tobacco or illicit drugs  Current medications and supplements including opioid prescriptions.  Functional ability and status Nutritional status Physical activity Advanced directives List of other physicians Hospitalizations, surgeries, and ER visits in previous 12 months Vitals Screenings to include cognitive, depression, and falls Referrals and appointments  In addition, I have reviewed and discussed with patient certain preventive protocols, quality metrics, and best practice recommendations. A written personalized care plan for preventive services as well as general preventive health recommendations were provided to patient.     Dionisio David, LPN   07/02/5363   Nurse Notes: Marlynn Perking

## 2022-01-06 NOTE — Patient Instructions (Signed)
Crystal White , Thank you for taking time to come for your Medicare Wellness Visit. I appreciate your ongoing commitment to your health goals. Please review the following plan we discussed and let me know if I can assist you in the future.   Screening recommendations/referrals: Colonoscopy: 11/23/20, AGED OUT Mammogram: AGED OUT Bone Density: 08/26/20, DECLINED REFERRAL Recommended yearly ophthalmology/optometry visit for glaucoma screening and checkup Recommended yearly dental visit for hygiene and checkup  Vaccinations: Influenza vaccine: 10/12/21 Pneumococcal vaccine: 08/28/14 Tdap vaccine: 08/28/14 Shingles vaccine: Shingrix 05/19/21, needs 2nd shot   Covid-19:01/09/20, 02/06/20, 10/12/20, 05/19/21  Advanced directives: no  Conditions/risks identified: none  Next appointment: Follow up in one year for your annual wellness visit - 01/10/23 @ 2:20pm by phone   Preventive Care 65 Years and Older, Female Preventive care refers to lifestyle choices and visits with your health care provider that can promote health and wellness. What does preventive care include? A yearly physical exam. This is also called an annual well check. Dental exams once or twice a year. Routine eye exams. Ask your health care provider how often you should have your eyes checked. Personal lifestyle choices, including: Daily care of your teeth and gums. Regular physical activity. Eating a healthy diet. Avoiding tobacco and drug use. Limiting alcohol use. Practicing safe sex. Taking low-dose aspirin every day. Taking vitamin and mineral supplements as recommended by your health care provider. What happens during an annual well check? The services and screenings done by your health care provider during your annual well check will depend on your age, overall health, lifestyle risk factors, and family history of disease. Counseling  Your health care provider may ask you questions about your: Alcohol use. Tobacco  use. Drug use. Emotional well-being. Home and relationship well-being. Sexual activity. Eating habits. History of falls. Memory and ability to understand (cognition). Work and work Statistician. Reproductive health. Screening  You may have the following tests or measurements: Height, weight, and BMI. Blood pressure. Lipid and cholesterol levels. These may be checked every 5 years, or more frequently if you are over 69 years old. Skin check. Lung cancer screening. You may have this screening every year starting at age 64 if you have a 30-pack-year history of smoking and currently smoke or have quit within the past 15 years. Fecal occult blood test (FOBT) of the stool. You may have this test every year starting at age 80. Flexible sigmoidoscopy or colonoscopy. You may have a sigmoidoscopy every 5 years or a colonoscopy every 10 years starting at age 38. Hepatitis C blood test. Hepatitis B blood test. Sexually transmitted disease (STD) testing. Diabetes screening. This is done by checking your blood sugar (glucose) after you have not eaten for a while (fasting). You may have this done every 1-3 years. Bone density scan. This is done to screen for osteoporosis. You may have this done starting at age 70. Mammogram. This may be done every 1-2 years. Talk to your health care provider about how often you should have regular mammograms. Talk with your health care provider about your test results, treatment options, and if necessary, the need for more tests. Vaccines  Your health care provider may recommend certain vaccines, such as: Influenza vaccine. This is recommended every year. Tetanus, diphtheria, and acellular pertussis (Tdap, Td) vaccine. You may need a Td booster every 10 years. Zoster vaccine. You may need this after age 80. Pneumococcal 13-valent conjugate (PCV13) vaccine. One dose is recommended after age 69. Pneumococcal polysaccharide (PPSV23) vaccine. One dose  is recommended  after age 52. Talk to your health care provider about which screenings and vaccines you need and how often you need them. This information is not intended to replace advice given to you by your health care provider. Make sure you discuss any questions you have with your health care provider. Document Released: 12/11/2015 Document Revised: 08/03/2016 Document Reviewed: 09/15/2015 Elsevier Interactive Patient Education  2017 [place] Prevention in the Home Falls can cause injuries. They can happen to people of all ages. There are many things you can do to make your home safe and to help prevent falls. What can I do on the outside of my home? Regularly fix the edges of walkways and driveways and fix any cracks. Remove anything that might make you trip as you walk through a door, such as a raised step or threshold. Trim any bushes or trees on the path to your home. Use bright outdoor lighting. Clear any walking paths of anything that might make someone trip, such as rocks or tools. Regularly check to see if handrails are loose or broken. Make sure that both sides of any steps have handrails. Any raised decks and porches should have guardrails on the edges. Have any leaves, snow, or ice cleared regularly. Use sand or salt on walking paths during winter. Clean up any spills in your garage right away. This includes oil or grease spills. What can I do in the bathroom? Use night lights. Install grab bars by the toilet and in the tub and shower. Do not use towel bars as grab bars. Use non-skid mats or decals in the tub or shower. If you need to sit down in the shower, use a plastic, non-slip stool. Keep the floor dry. Clean up any water that spills on the floor as soon as it happens. Remove soap buildup in the tub or shower regularly. Attach bath mats securely with double-sided non-slip rug tape. Do not have throw rugs and other things on the floor that can make you trip. What can I do  in the bedroom? Use night lights. Make sure that you have a light by your bed that is easy to reach. Do not use any sheets or blankets that are too big for your bed. They should not hang down onto the floor. Have a firm chair that has side arms. You can use this for support while you get dressed. Do not have throw rugs and other things on the floor that can make you trip. What can I do in the kitchen? Clean up any spills right away. Avoid walking on wet floors. Keep items that you use a lot in easy-to-reach places. If you need to reach something above you, use a strong step stool that has a grab bar. Keep electrical cords out of the way. Do not use floor polish or wax that makes floors slippery. If you must use wax, use non-skid floor wax. Do not have throw rugs and other things on the floor that can make you trip. What can I do with my stairs? Do not leave any items on the stairs. Make sure that there are handrails on both sides of the stairs and use them. Fix handrails that are broken or loose. Make sure that handrails are as long as the stairways. Check any carpeting to make sure that it is firmly attached to the stairs. Fix any carpet that is loose or worn. Avoid having throw rugs at the top or bottom of the stairs.  If you do have throw rugs, attach them to the floor with carpet tape. Make sure that you have a light switch at the top of the stairs and the bottom of the stairs. If you do not have them, ask someone to add them for you. What else can I do to help prevent falls? Wear shoes that: Do not have high heels. Have rubber bottoms. Are comfortable and fit you well. Are closed at the toe. Do not wear sandals. If you use a stepladder: Make sure that it is fully opened. Do not climb a closed stepladder. Make sure that both sides of the stepladder are locked into place. Ask someone to hold it for you, if possible. Clearly mark and make sure that you can see: Any grab bars or  handrails. First and last steps. Where the edge of each step is. Use tools that help you move around (mobility aids) if they are needed. These include: Canes. Walkers. Scooters. Crutches. Turn on the lights when you go into a dark area. Replace any light bulbs as soon as they burn out. Set up your furniture so you have a clear path. Avoid moving your furniture around. If any of your floors are uneven, fix them. If there are any pets around you, be aware of where they are. Review your medicines with your doctor. Some medicines can make you feel dizzy. This can increase your chance of falling. Ask your doctor what other things that you can do to help prevent falls. This information is not intended to replace advice given to you by your health care provider. Make sure you discuss any questions you have with your health care provider. Document Released: 09/10/2009 Document Revised: 04/21/2016 Document Reviewed: 12/19/2014 Elsevier Interactive Patient Education  2017 [employer].

## 2022-01-18 ENCOUNTER — Telehealth: Payer: Self-pay

## 2022-01-18 NOTE — Progress Notes (Addendum)
Chronic Care Management Pharmacy Assistant   Name: JOY REIGER  MRN: [id number] DOB: 1941/05/08  Reason for Encounter: Medication Review/Medication Coordination Call   Recent office visits:  01/06/2022 Kirke Shaggy, LPN (PCP Clinical Support) for Medicare Wellness Exam- No medication changes noted, no orders placed, no follow-up noted  Recent consult visits:  None ID  Hospital visits:  None in previous 6 months  Medications: Outpatient Encounter Medications as of 01/18/2022  Medication Sig   acetaminophen (TYLENOL) 500 MG tablet Take 1,000 mg by mouth every 6 (six) hours as needed (pain/headaches.).    albuterol (PROVENTIL HFA) 108 (90 Base) MCG/ACT inhaler Inhale 2 puffs into the lungs every 6 (six) hours as needed for wheezing or shortness of breath.   albuterol (PROVENTIL) (2.5 MG/3ML) 0.083% nebulizer solution Take 3 mLs (2.5 mg total) by nebulization every 4 (four) hours as needed for wheezing or shortness of breath.   Budeson-Glycopyrrol-Formoterol (BREZTRI AEROSPHERE) 160-9-4.8 MCG/ACT AERO Inhale 2 puffs into the lungs in the morning and at bedtime.   citalopram (CELEXA) 10 MG tablet TAKE 1 TABLET BY MOUTH AT  BEDTIME   ezetimibe (ZETIA) 10 MG tablet TAKE 1 TABLET BY MOUTH DAILY   gabapentin (NEURONTIN) 100 MG capsule TAKE 2 CAPSULES BY MOUTH 3  TIMES DAILY   hydrochlorothiazide (HYDRODIURIL) 25 MG tablet Take 1 tablet (25 mg total) by mouth daily.   losartan (COZAAR) 100 MG tablet TAKE 1 TABLET(100 MG) BY MOUTH DAILY   mirtazapine (REMERON) 30 MG tablet Take 1 tablet (30 mg total) by mouth at bedtime.   OXYGEN Inhale into the lungs daily. Uses at night with CPAP and PRN during daytime   pantoprazole (PROTONIX) 40 MG tablet TAKE 1 TABLET BY MOUTH TWICE  DAILY   traZODone (DESYREL) 150 MG tablet TAKE 1 TABLET BY MOUTH AT  BEDTIME   No facility-administered encounter medications on file as of 01/18/2022.   Care Gaps: Hepatitis C Screening COVID-19 Vaccine Booster  4 Zoster Vaccine  Star Rating Drugs: Losartan 100 mg last filled on 11/15/2021 for a 90-Day supply with [facility]  BP Readings from Last 3 Encounters:  10/12/21 140/70  09/06/21 (!) 162/96  04/02/21 (!) 170/84    Lab Results  Component Value Date   HGBA1C 5.5 01/24/2017     Patient obtains medications through Adherence Packaging  30 Days   Last adherence delivery included:  This is the first medication adherence delivery for this patient so no delivery for last month  Patient declined medications last month: N/A  Patient is due for next adherence delivery on: 01/28/2022 (Friday) 1st Route.  Called patient and reviewed medications and coordinated delivery.  This delivery to include: Citalopram 10 mg 1 tablet daily (Bedtime) Pantoprazole 40 mg 1 tablet twice daily (Breakfast, Bedtime) Ezetimibe (Zetia) 10 mg 1 tablet daily (Breakfast) Hydrochlorothiazide 25 mg 1 tablet daily (Breakfast) Losartan 100 mg 1 tablet daily (Bedtime) Trazodone 150 mg 1 tablet daily (Bedtime) Gabapentin 100 mg take 2 capsules three times daily (Breakfast, Lunch, Bedtime) Mirtazapine 30 mg 1 tablet daily (Bedtime) Albuterol 108 Inhale 2 puffs into the lungs every 6 hours prn for wheezing or shortness of breath  Patient declined the following medications  Breztri 16-9-4.8 mcg inhale 2 puffs in the morning and at bedtime - patient receives this medication through [employer] patient assistance program  Patient needs refills for No refills needed at this time.  Confirmed delivery date of 01/28/2022 1st Route, advised patient that pharmacy will contact them the morning  of delivery.   Patient has a scheduled telephone appointment with [name], CPP on 03/07/2022 @ 1100  I spoke with the patient today 02/24 and she reports she is doing well. She does state that she is using her albuterol inhaler daily she said it seems depending on her activity that she is using this inhaler more. Patient does  confirm the use of her Breztri inhaler as prescribed as well but she is also using her rescue inhaler daily. I advised her I would inform the CPP.  Lynann Bologna, CPA/CMA Clinical Pharmacist Assistant Phone: [phone]   Addendum 01/21/22: Will plan for COPD symptom assessment in 1-2 weeks.  Malva Limes, [place] Pharmacist Practitioner  [place] [phone]

## 2022-01-25 ENCOUNTER — Telehealth: Payer: Medicare Other

## 2022-01-25 DIAGNOSIS — E7800 Pure hypercholesterolemia, unspecified: Secondary | ICD-10-CM | POA: Diagnosis not present

## 2022-01-25 DIAGNOSIS — J449 Chronic obstructive pulmonary disease, unspecified: Secondary | ICD-10-CM | POA: Diagnosis not present

## 2022-01-25 DIAGNOSIS — I10 Essential (primary) hypertension: Secondary | ICD-10-CM

## 2022-01-27 ENCOUNTER — Telehealth: Payer: Self-pay | Admitting: Acute Care

## 2022-01-27 ENCOUNTER — Other Ambulatory Visit: Payer: Self-pay

## 2022-01-27 DIAGNOSIS — Z87891 Personal history of nicotine dependence: Secondary | ICD-10-CM

## 2022-01-27 DIAGNOSIS — F17210 Nicotine dependence, cigarettes, uncomplicated: Secondary | ICD-10-CM

## 2022-01-27 NOTE — Telephone Encounter (Signed)
Attempted to contact by phone to schedule annual LDCT.  Active order has been placed to verify insurance would accept order for age 81.  Unable to reach patient by phone to schedule and voicemail is full and unable to leave message ?

## 2022-02-08 ENCOUNTER — Telehealth: Payer: Self-pay

## 2022-02-08 NOTE — Progress Notes (Signed)
? ? ?Chronic Care Management ?Pharmacy Assistant  ? ?Name: Crystal White  MRN: [id number] DOB: 09/06/1941 ? ?Reason for Encounter: COPD Disease State Call ?  ?Recent office visits:  ?None ID ? ?Recent consult visits:  ?01/20/2022 Nanda Quinton (Dentistry) for Preventative Exam- No medication changes noted, no orders placed, patient to return in 6 months ? ?Hospital visits:  ?None in previous 6 months ? ?Medications: ?Outpatient Encounter Medications as of 02/08/2022  ?Medication Sig  ? acetaminophen (TYLENOL) 500 MG tablet Take 1,000 mg by mouth every 6 (six) hours as needed (pain/headaches.).   ? albuterol (PROVENTIL HFA) 108 (90 Base) MCG/ACT inhaler Inhale 2 puffs into the lungs every 6 (six) hours as needed for wheezing or shortness of breath.  ? albuterol (PROVENTIL) (2.5 MG/3ML) 0.083% nebulizer solution Take 3 mLs (2.5 mg total) by nebulization every 4 (four) hours as needed for wheezing or shortness of breath.  ? Budeson-Glycopyrrol-Formoterol (BREZTRI AEROSPHERE) 160-9-4.8 MCG/ACT AERO Inhale 2 puffs into the lungs in the morning and at bedtime.  ? citalopram (CELEXA) 10 MG tablet TAKE 1 TABLET BY MOUTH AT  BEDTIME  ? ezetimibe (ZETIA) 10 MG tablet TAKE 1 TABLET BY MOUTH DAILY  ? gabapentin (NEURONTIN) 100 MG capsule TAKE 2 CAPSULES BY MOUTH 3  TIMES DAILY  ? hydrochlorothiazide (HYDRODIURIL) 25 MG tablet Take 1 tablet (25 mg total) by mouth daily.  ? losartan (COZAAR) 100 MG tablet TAKE 1 TABLET(100 MG) BY MOUTH DAILY  ? mirtazapine (REMERON) 30 MG tablet Take 1 tablet (30 mg total) by mouth at bedtime.  ? OXYGEN Inhale into the lungs daily. Uses at night with CPAP and PRN during daytime  ? pantoprazole (PROTONIX) 40 MG tablet TAKE 1 TABLET BY MOUTH TWICE  DAILY  ? traZODone (DESYREL) 150 MG tablet TAKE 1 TABLET BY MOUTH AT  BEDTIME  ? ?No facility-administered encounter medications on file as of 02/08/2022.  ? ?Care Gaps: ?Hepatitis C Screening ?COVID-19 Vaccine Booster 4 ?Zoster Vaccine ? ? ?Star Rating  Drugs: ?Losartan 100 mg last filled on 11/15/2021 for a 90-Day supply with [facility] ? ? ?COPD Assessment Test (CAT) from [email or web address]  on 02/08/2022 ?** All calculations should be rechecked by clinician prior to use ** ? ?RESULT SUMMARY: ?14 points ?CAT Score ? ?Medium  ?Health impact ?  ?Recommend smoking cessation, preventive care, reduced exposure to exacerbation risk factors, and LAMA and rescue inhalers; consider ICS and/or LABA, referrals for pulmonary rehab, and possible lung transplant evaluation ? ?INPUTS: ?Cough --> 1 = 1 ?Phlegm --> 0 = 0 (?I have no phlegm in my chest at all?) ?Chest tightness --> 0 = 0 (?My chest does not feel tight at all?) ?Breathlessness --> 5 = 5 (?When I walk up a hill or one flight of stairs I am very breathless?) ?Activities --> 5 = 5 (?I am very limited doing any activities at home?) ?Confidence --> 0 = 0 (?I am confident leaving my home despite my lung condition?) ?Sleep --> 0 = 0 (?I sleep soundly?) ?Energy --> 3 = 3 ? ?I spoke with the patient, and she reports that she is feeling well. She reports that she has issues with her breathing, but at this moment is nothing out of her normal symptoms. Patient stated she does have coughing daily, shortness of breath upon walking long periods of time, and doing activities. She stated that she has to rest for a little after showering. She denies any abnormal COPD symptoms at this time. Patient reports taking all  her medications as directed with no problems, and she has no concerns or issues today. Patient encouraged to give me a call if things change.  ? ?Patient has a telephone appointment with [name], CPP on 06/07/2022 @ 1500 ? ?Lynann Bologna, CPA/CMA ?Clinical Pharmacist Assistant ?Phone: [phone]  ? ?

## 2022-02-14 ENCOUNTER — Other Ambulatory Visit: Payer: Self-pay | Admitting: Family Medicine

## 2022-02-14 DIAGNOSIS — I10 Essential (primary) hypertension: Secondary | ICD-10-CM

## 2022-02-17 ENCOUNTER — Encounter: Payer: Self-pay | Admitting: Acute Care

## 2022-02-17 ENCOUNTER — Telehealth: Payer: Self-pay

## 2022-02-17 NOTE — Progress Notes (Signed)
? ? ?Chronic Care Management ?Pharmacy Assistant  ? ?Name: Crystal White  MRN: [id number] DOB: 1941-11-08 ? ?Reason for Encounter: Medication Review/Medication Coordination for [facility] ? ?Recent office visits:  ?None ID ? ?Recent consult visits:  ?None ID ? ?Hospital visits:  ?None in previous 6 months ? ?Medications: ?Outpatient Encounter Medications as of 02/17/2022  ?Medication Sig  ? acetaminophen (TYLENOL) 500 MG tablet Take 1,000 mg by mouth every 6 (six) hours as needed (pain/headaches.).   ? albuterol (PROVENTIL HFA) 108 (90 Base) MCG/ACT inhaler Inhale 2 puffs into the lungs every 6 (six) hours as needed for wheezing or shortness of breath.  ? albuterol (PROVENTIL) (2.5 MG/3ML) 0.083% nebulizer solution Take 3 mLs (2.5 mg total) by nebulization every 4 (four) hours as needed for wheezing or shortness of breath.  ? Budeson-Glycopyrrol-Formoterol (BREZTRI AEROSPHERE) 160-9-4.8 MCG/ACT AERO Inhale 2 puffs into the lungs in the morning and at bedtime.  ? citalopram (CELEXA) 10 MG tablet TAKE 1 TABLET BY MOUTH AT  BEDTIME  ? ezetimibe (ZETIA) 10 MG tablet TAKE 1 TABLET BY MOUTH DAILY  ? gabapentin (NEURONTIN) 100 MG capsule TAKE 2 CAPSULES BY MOUTH 3  TIMES DAILY  ? hydrochlorothiazide (HYDRODIURIL) 25 MG tablet Take 1 tablet (25 mg total) by mouth daily.  ? losartan (COZAAR) 100 MG tablet TAKE 1 TABLET(100 MG) BY MOUTH DAILY  ? mirtazapine (REMERON) 30 MG tablet Take 1 tablet (30 mg total) by mouth at bedtime.  ? OXYGEN Inhale into the lungs daily. Uses at night with CPAP and PRN during daytime  ? pantoprazole (PROTONIX) 40 MG tablet TAKE 1 TABLET BY MOUTH TWICE  DAILY  ? traZODone (DESYREL) 150 MG tablet TAKE 1 TABLET BY MOUTH AT  BEDTIME  ? ?No facility-administered encounter medications on file as of 02/17/2022.  ? ?Care Gaps: ?Care Gaps: ?Hepatitis C Screening ?COVID-19 Vaccine Booster 4 ?Zoster Vaccine ? ?Star Rating Drugs: ?Losartan 100 mg last filled on 11/15/2021 for a 90-Day supply with  [facility] ? ?BP Readings from Last 3 Encounters:  ?10/12/21 140/70  ?09/06/21 (!) 162/96  ?04/02/21 (!) 170/84  ?  ?Lab Results  ?Component Value Date  ? HGBA1C 5.5 01/24/2017  ? ?Patient obtains medications through Adherence Packaging  30 Days  ? ?Last adherence delivery included:  ?Citalopram 10 mg 1 tablet daily (Bedtime) ?Pantoprazole 40 mg 1 tablet twice daily (Breakfast, Bedtime) ?Ezetimibe (Zetia) 10 mg 1 tablet daily (Breakfast) ?Hydrochlorothiazide 25 mg 1 tablet daily (Breakfast) ?Losartan 100 mg 1 tablet daily (Bedtime) ?Trazodone 150 mg 1 tablet daily (Bedtime) ?Gabapentin 100 mg take 2 capsules three times daily (Breakfast, Lunch, Bedtime) ?Mirtazapine 30 mg 1 tablet daily (Bedtime) ?Albuterol 108 Inhale 2 puffs into the lungs every 6 hours prn for wheezing or shortness of breath ? ?Patient declined medications last month: ?Breztri 16-9-4.8 mcg inhale 2 puffs in the morning and at bedtime - patient receives this medication through [employer] patient assistance program ? ?Patient is due for next adherence delivery on: 03/01/2022 (Tuesday) 2nd Route after 3 pm. ? ?Called patient and reviewed medications and coordinated delivery. ? ?This delivery to include: ?Hydrochlorothiazide 25 mg 1 tablet daily (Breakfast) ?Mirtazapine 30 mg 1 tablet daily (Bedtime) ?Pantoprazole 40 mg 1 tablet twice daily (Breakfast, Bedtime) ?Trazodone 150 mg 1 tablet daily (Bedtime) ?Ezetimibe (Zetia) 10 mg 1 tablet daily (Breakfast) ?Citalopram 10 mg 1 tablet daily (Bedtime) ?Gabapentin 100 mg take 2 capsules three times daily (Breakfast, Lunch, Bedtime) ?Losartan 100 mg 1 tablet daily (Bedtime) ?Albuterol 108 Inhale  2 puffs into the lungs every 6 hours prn for wheezing or shortness of breath ? ?Patient will need a short fill of (med), prior to adherence delivery. (To align with sync date or if PRN med) ? ?Coordinated acute fill for (med) to be delivered (date). ? ?Patient declined the following medications: ?Breztri  16-9-4.8 mcg inhale 2 puffs in the morning and at bedtime - patient receives this medication through [employer] patient assistance program ? ?Patient needs refills for No Refills needed at this time. ? ?Confirmed delivery date of 03/01/2022 2nd Route, advised patient that pharmacy will contact them the morning of delivery. ? ?Spoke to the patient, and she confirms she is feeling well today. Patient denies any ill symptoms at this time. She reports that her breathing is her normal nothing acute. Patient has no concerns or issues today. ? ?Patient has a telephone appointment with CPP on 06/07/2022 @ 1500 ? ?Lynann Bologna, CPA/CMA ?Clinical Pharmacist Assistant ?Phone: [phone]  ? ?

## 2022-03-07 ENCOUNTER — Telehealth: Payer: Medicare Other

## 2022-03-09 ENCOUNTER — Telehealth: Payer: Self-pay | Admitting: *Deleted

## 2022-03-09 NOTE — Telephone Encounter (Signed)
Provider form faxed to [employer] patient assistance. ?

## 2022-03-11 NOTE — Telephone Encounter (Signed)
Received faxed form from [employer] regarding shipment of medication:  allow 1-2 business days for shipment to arrive.  Nothing further needed. ?

## 2022-03-18 ENCOUNTER — Telehealth: Payer: Self-pay

## 2022-03-18 NOTE — Progress Notes (Signed)
? ? ?Chronic Care Management ?Pharmacy Assistant  ? ?Name: Crystal White  MRN: [id number] DOB: [birth year] ? ?Reason for Encounter: Medication Review/Medication Coordination Call for [facility] ?  ?Recent office visits:  ?None ID ? ?Recent consult visits:  ?None ID ? ?Hospital visits:  ?None in previous 6 months ? ?Medications: ?Outpatient Encounter Medications as of 03/18/2022  ?Medication Sig  ? acetaminophen (TYLENOL) 500 MG tablet Take 1,000 mg by mouth every 6 (six) hours as needed (pain/headaches.).   ? albuterol (PROVENTIL HFA) 108 (90 Base) MCG/ACT inhaler Inhale 2 puffs into the lungs every 6 (six) hours as needed for wheezing or shortness of breath.  ? albuterol (PROVENTIL) (2.5 MG/3ML) 0.083% nebulizer solution Take 3 mLs (2.5 mg total) by nebulization every 4 (four) hours as needed for wheezing or shortness of breath.  ? Budeson-Glycopyrrol-Formoterol (BREZTRI AEROSPHERE) 160-9-4.8 MCG/ACT AERO Inhale 2 puffs into the lungs in the morning and at bedtime.  ? citalopram (CELEXA) 10 MG tablet TAKE 1 TABLET BY MOUTH AT  BEDTIME  ? ezetimibe (ZETIA) 10 MG tablet TAKE 1 TABLET BY MOUTH DAILY  ? gabapentin (NEURONTIN) 100 MG capsule TAKE 2 CAPSULES BY MOUTH 3  TIMES DAILY  ? hydrochlorothiazide (HYDRODIURIL) 25 MG tablet Take 1 tablet (25 mg total) by mouth daily.  ? losartan (COZAAR) 100 MG tablet TAKE 1 TABLET(100 MG) BY MOUTH DAILY  ? mirtazapine (REMERON) 30 MG tablet Take 1 tablet (30 mg total) by mouth at bedtime.  ? OXYGEN Inhale into the lungs daily. Uses at night with CPAP and PRN during daytime  ? pantoprazole (PROTONIX) 40 MG tablet TAKE 1 TABLET BY MOUTH TWICE  DAILY  ? traZODone (DESYREL) 150 MG tablet TAKE 1 TABLET BY MOUTH AT  BEDTIME  ? ?No facility-administered encounter medications on file as of 03/18/2022.  ? ?Care Gaps: ?Hepatitis C Screening ?COVID-19 Vaccine Booster 4 ?Zoster Vaccine ? ?Star Rating Drugs: ?Losartan 100 mg last filled on 02/23/2022 for a 30-Day supply with [facility] ? ?BP Readings from Last 3 Encounters:  ?10/12/21 140/70  ?09/06/21 (!) 162/96  ?04/02/21 (!) 170/84  ?  ?Lab Results  ?Component Value Date  ? HGBA1C 5.5 01/24/2017  ?  ?Patient obtains medications through Adherence Packaging  30 Days  ? ?Last adherence delivery included:  ?Hydrochlorothiazide 25 mg 1 tablet daily (Breakfast) ?Mirtazapine 30 mg 1 tablet daily (Bedtime) ?Pantoprazole 40 mg 1 tablet twice daily (Breakfast, Bedtime) ?Trazodone 150 mg 1 tablet daily (Bedtime) ?Ezetimibe (Zetia) 10 mg 1 tablet daily (Breakfast) ?Citalopram 10 mg 1 tablet daily (Bedtime) ?Gabapentin 100 mg take 2 capsules three times daily (Breakfast, Lunch, Bedtime) ?Losartan 100 mg 1 tablet daily (Bedtime) ?Albuterol 108 Inhale 2 puffs into the lungs every 6 hours prn for wheezing or shortness of breath ? ?Patient declined medications last month: ?Breztri 16-9-4.8 mcg inhale 2 puffs in the morning and at bedtime - patient receives this medication through [employer] patient assistance program ? ?Patient is due for next adherence delivery on: 03/30/2022 (Wednesday) 1st Route 11 am - 3 pm. ? ?Called patient and reviewed medications and coordinated delivery. ? ?This delivery to include: ?Hydrochlorothiazide 25 mg 1 tablet daily (Breakfast) ?Mirtazapine 30 mg 1 tablet daily (Bedtime) ?Pantoprazole 40 mg 1 tablet twice daily (Breakfast, Bedtime) ?Trazodone 150 mg 1 tablet daily (Bedtime) ?Ezetimibe (Zetia) 10 mg 1 tablet daily (Breakfast) ?Citalopram 10 mg 1 tablet daily (Bedtime) ?Gabapentin 100 mg take 2 capsules three times daily (Breakfast, Lunch, Bedtime) ?Losartan 100 mg 1 tablet daily (Bedtime) ?Albuterol  108 Inhale 2 puffs into the lungs every 6 hours prn for wheezing or shortness of breath ? ?Patient declined the following medications: ?Breztri 16-9-4.8 mcg inhale 2 puffs in the morning and at bedtime - patient receives this medication through [employer] patient assistance program ? ?Patient needs refills for ?No Refills needed for  this month's order ? ?Confirmed delivery date of 03/30/2022 1st Route, advised patient that pharmacy will contact them the morning of delivery.  ? ?Patient has a telephone appointment with [name], CPP on 06/07/2022 @ 1500. ? ?I spoke to the patient and she reports this morning she is having a hard time with her breathing. Patient reports that she has been gardening and she feels the pollen is causing her flare ups. She reports that her inhalers are not working, but her oxygen is helping. I did inquire if the patient had used her nebulizer machine and she stated she had not thought about that. Patient confirms that she has medication for her nebulizer machine and will take a treatment. I informed patient she can take a treatment q4h prn for wheezing and shortness of breath.  ? ?Patient encouraged to give me a call if her nebulizer treatments do not work for her.  ? ?Lynann Bologna, CPA/CMA ?Clinical Pharmacist Assistant ?Phone: [phone]  ? ? ?

## 2022-03-22 ENCOUNTER — Telehealth: Payer: Medicare Other

## 2022-04-15 ENCOUNTER — Ambulatory Visit (INDEPENDENT_AMBULATORY_CARE_PROVIDER_SITE_OTHER): Payer: Medicare Other

## 2022-04-15 DIAGNOSIS — J449 Chronic obstructive pulmonary disease, unspecified: Secondary | ICD-10-CM

## 2022-04-15 DIAGNOSIS — E7800 Pure hypercholesterolemia, unspecified: Secondary | ICD-10-CM

## 2022-04-15 DIAGNOSIS — I10 Essential (primary) hypertension: Secondary | ICD-10-CM

## 2022-04-15 NOTE — Chronic Care Management (AMB) (Signed)
Chronic Care Management   CCM RN Visit Note  04/15/2022 Name: Crystal White MRN: [id number] DOB: 06-Dec-1940  Subjective: Crystal White is a 81 y.o. year old female who is a primary care patient of Jerrol Banana., MD. The care management team was consulted for assistance with disease management and care coordination needs.    Engaged with patient by telephone for follow up visit in response to provider referral for case management and care coordination services.   Consent to Services:  The patient was given information about Chronic Care Management services, agreed to services, and gave verbal consent prior to initiation of services.  Please see initial visit note for detailed documentation.   Assessment: Review of patient past medical history, allergies, medications, health status, including review of consultants reports, laboratory and other test data, was performed as part of comprehensive evaluation and provision of chronic care management services.   SDOH (Social Determinants of Health) assessments and interventions performed: No  CCM Care Plan  Allergies  Allergen Reactions   Iodinated Contrast Media Other (See Comments)    Burning sensation during contrast media injections. Patient states the IV contrast makes her very hot. She has had multiple CT Scans with IV contrast and states she only gets the warm sensation. This is not an allergic reaction, but is noted in her [facility] chart as well. Aggie Hacker, ARRT R CT, had this discussion with her on 01/10/19 at 1130am.   Bacitracin Swelling and Rash   Statins Other (See Comments) and Rash    Reaction:  Leg cramps  Reaction:  Leg cramps     Outpatient Encounter Medications as of 04/15/2022  Medication Sig   acetaminophen (TYLENOL) 500 MG tablet Take 1,000 mg by mouth every 6 (six) hours as needed (pain/headaches.).    albuterol (PROVENTIL HFA) 108 (90 Base) MCG/ACT inhaler Inhale 2 puffs into the lungs every 6 (six)  hours as needed for wheezing or shortness of breath.   albuterol (PROVENTIL) (2.5 MG/3ML) 0.083% nebulizer solution Take 3 mLs (2.5 mg total) by nebulization every 4 (four) hours as needed for wheezing or shortness of breath.   Budeson-Glycopyrrol-Formoterol (BREZTRI AEROSPHERE) 160-9-4.8 MCG/ACT AERO Inhale 2 puffs into the lungs in the morning and at bedtime.   citalopram (CELEXA) 10 MG tablet TAKE 1 TABLET BY MOUTH AT  BEDTIME   ezetimibe (ZETIA) 10 MG tablet TAKE 1 TABLET BY MOUTH DAILY   gabapentin (NEURONTIN) 100 MG capsule TAKE 2 CAPSULES BY MOUTH 3  TIMES DAILY   hydrochlorothiazide (HYDRODIURIL) 25 MG tablet Take 1 tablet (25 mg total) by mouth daily.   losartan (COZAAR) 100 MG tablet TAKE 1 TABLET(100 MG) BY MOUTH DAILY   mirtazapine (REMERON) 30 MG tablet Take 1 tablet (30 mg total) by mouth at bedtime.   OXYGEN Inhale into the lungs daily. Uses at night with CPAP and PRN during daytime   pantoprazole (PROTONIX) 40 MG tablet TAKE 1 TABLET BY MOUTH TWICE  DAILY   traZODone (DESYREL) 150 MG tablet TAKE 1 TABLET BY MOUTH AT  BEDTIME   No facility-administered encounter medications on file as of 04/15/2022.    Patient Active Problem List   Diagnosis Date Noted   Wears partial dentures 07/17/2019   Incisional hernia, without obstruction or gangrene 06/03/2016   Osteoporosis 02/22/2016   Musculoskeletal chest pain 07/14/2015   Chronic respiratory failure ([place]) 07/14/2015   Acute bronchitis 07/14/2015   Other emphysema ([place]) 07/14/2015   Chronic renal insufficiency 07/14/2015   Chest pain  07/13/2015   Multiple pulmonary nodules 06/02/2015   Colon polyp 05/29/2015   Hemoptysis 04/29/2015   Sprain of ankle 04/14/2015   Anxiety 04/14/2015   Barrett's esophagus 04/14/2015   Acute exacerbation of chronic obstructive airways disease ([place]) 04/14/2015   Bursitis 04/14/2015   Cellulitis 04/14/2015   Claudication ([place]) 04/14/2015   Cough 04/14/2015   Deficiency, 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   Clinical depression 04/14/2015   Dizziness 04/14/2015   Dyslipidemia 04/14/2015   Essential (primary) hypertension 04/14/2015   Flank pain 04/14/2015   H/O suicide attempt 04/14/2015   Dysphonia 04/14/2015   Hypercholesteremia 04/14/2015   Cannot sleep 04/14/2015   Malaise and fatigue 04/14/2015   Mild cognitive impairment 04/14/2015   Cramp in muscle 04/14/2015   Muscle ache 04/14/2015   Neuropathy 04/14/2015   Adiposity 04/14/2015   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   Candida infection of mouth 04/14/2015   Abnormal loss of weight 04/14/2015   Decreased body weight 04/14/2015   Artificial cardiac pacemaker 10/08/2014   Apnea, sleep 10/08/2014   Presence of cardiac pacemaker 10/08/2014   Hyperlipidemia 10/08/2014   COPD (chronic obstructive pulmonary disease) ([place]) 02/13/2012   Tobacco abuse 02/13/2012   Sinus congestion 02/13/2012   Current tobacco use 02/13/2012   Nasal congestion 02/13/2012   Tobacco use disorder 02/13/2012   Esophagitis, reflux 08/28/2006   Abdominal pain, right lower quadrant 08/22/2005   Acute appendicitis with peritoneal abscess 08/22/2005     Patient Care Plan: RN Care Managemement Plan of Care     Problem Identified: HTN, HLD, COPD      Long-Range Goal: Disease Progression Prevented or Minimized   Start Date: 04/15/2022  Expected End Date: 07/14/2022  Priority: High  Note:   Current Barriers:  Chronic Disease Management support and education needs related to HTN, HLD, and COPD   RNCM Clinical Goal(s):  Patient will demonstrate ongoing adherence to prescribed treatment plan for HTN, HLD, and COPD through collaboration with the provider, RN Care Manager and the care team.   Interventions: 1:1 collaboration with primary care provider regarding development and update of comprehensive plan of care as evidenced by provider attestation and co-signature Inter-disciplinary care team collaboration (see longitudinal plan of  care) Evaluation of current treatment plan related to  self management and patient's adherence to plan as established by provider   COPD Interventions:   Reviewed plan for COPD management.  Discussed medications. Reports increased episodes of shortness of breath with exertion, but reports symptoms have been well controlled with Breztri and albuterol inhaler. Uses the albuterol nebulizer when needed. Reports symptoms have been well controlled.  Discussed current activity level. Reports being able to complete all ADLs independently, but notices need to take frequent breaks. Reports saturations will generally drop into the 80's but will quickly recover once she takes a break. Denies episodes of shortness of breath at rest. Denies episodes of feeling faint or lightheaded. Reports using her upright walker as recommended. Reports using CPAP with supplemental oxygen consistently at night as recommended. Reviewed importance of daily self-assessment. Verbalized awareness of need to contact the clinic of the Pulmonology team if experiencing moderate symptoms for greater than 48 hours without improvement.  Advised to continue utilizing prevention strategies to reduce risk of respiratory infection.  Reviewed worsening symptoms that require immediate medical attention.    Hyperlipidemia Interventions:   Reviewed plan for hyperlipidemia management. Reports taking medication as prescribed. Reviewed cholesterol goals. Last labs (11/22) were elevated. Advised to complete follow up labs when ordered. Reviewed importance of  limiting foods high in cholesterol. Advised to continue reading nutrition labels and avoid highly processed foods when possible. Discussed activity goals. Activity is limited d/t COPD. Reports still being able to engage in short walks and takes breaks as needed. Advised to continue utilizing upright walker and engaging in low impact walks as tolerated.  Hypertension Interventions:   Reviewed  plan for hypertension management. Reviewed blood pressure parameters. Reports readings have been within range. Denies episodes of chest discomfort, palpitations, or headaches. No lightheadedness or dizziness. Reports taking medications as prescribed and tolerating regimen well. Discussed nutritional intake and reviewed importance of adhering to a heart healthy diet. Reviewed s/sx of heart attack, stroke and worsening symptoms that require immediate medical attention. Expressed concerns regarding a notice received from her insurance provider. Notice indicated her current Cardiologist/Dr. Josefa Half is no longer in her network. Will follow up with the Cardiology clinic to determine if Dr. Josefa Half is practicing in a different area. Will follow up next week to conduct conference call with [facility] to discuss options if patient is required to change providers.  Patient Goals/Self-Care Activities: Take all medications as prescribed Attend all scheduled provider appointments Call pharmacy for medication refills 3-7 days in advance of running out of medications Perform all self care activities independently Call provider office for new concerns or questions    Will follow up next week      PLAN: Will follow up next week.   [name] Care Management [facility] [phone]

## 2022-04-18 ENCOUNTER — Telehealth: Payer: Self-pay

## 2022-04-18 DIAGNOSIS — E7800 Pure hypercholesterolemia, unspecified: Secondary | ICD-10-CM

## 2022-04-18 NOTE — Progress Notes (Signed)
Chronic Care Management Pharmacy Assistant   Name: Crystal White  MRN: [id number] DOB: 1941/05/14  Reason for Encounter: Medication Review/Medication Coordination Call for [facility]   Recent office visits:  None ID  Recent consult visits:  None ID  Hospital visits:  None in previous 6 months  Medications: Outpatient Encounter Medications as of 04/18/2022  Medication Sig   acetaminophen (TYLENOL) 500 MG tablet Take 1,000 mg by mouth every 6 (six) hours as needed (pain/headaches.).    albuterol (PROVENTIL HFA) 108 (90 Base) MCG/ACT inhaler Inhale 2 puffs into the lungs every 6 (six) hours as needed for wheezing or shortness of breath.   albuterol (PROVENTIL) (2.5 MG/3ML) 0.083% nebulizer solution Take 3 mLs (2.5 mg total) by nebulization every 4 (four) hours as needed for wheezing or shortness of breath.   Budeson-Glycopyrrol-Formoterol (BREZTRI AEROSPHERE) 160-9-4.8 MCG/ACT AERO Inhale 2 puffs into the lungs in the morning and at bedtime.   citalopram (CELEXA) 10 MG tablet TAKE 1 TABLET BY MOUTH AT  BEDTIME   ezetimibe (ZETIA) 10 MG tablet TAKE 1 TABLET BY MOUTH DAILY   gabapentin (NEURONTIN) 100 MG capsule TAKE 2 CAPSULES BY MOUTH 3  TIMES DAILY   hydrochlorothiazide (HYDRODIURIL) 25 MG tablet Take 1 tablet (25 mg total) by mouth daily.   losartan (COZAAR) 100 MG tablet TAKE 1 TABLET(100 MG) BY MOUTH DAILY   mirtazapine (REMERON) 30 MG tablet Take 1 tablet (30 mg total) by mouth at bedtime.   OXYGEN Inhale into the lungs daily. Uses at night with CPAP and PRN during daytime   pantoprazole (PROTONIX) 40 MG tablet TAKE 1 TABLET BY MOUTH TWICE  DAILY   traZODone (DESYREL) 150 MG tablet TAKE 1 TABLET BY MOUTH AT  BEDTIME   No facility-administered encounter medications on file as of 04/18/2022.   Care Gaps: Hepatitis C Screening COVID-19 Vaccine Booster 4 Zoster Vaccine  Star Rating Drugs: Losartan 100 mg last filled on 03/24/2022 for a 30-Day supply with [facility]  BP Readings from Last 3 Encounters:  10/12/21 140/70  09/06/21 (!) 162/96  04/02/21 (!) 170/84    Lab Results  Component Value Date   HGBA1C 5.5 01/24/2017    Patient obtains medications through Adherence Packaging  30 Days   Last adherence delivery included:  Hydrochlorothiazide 25 mg 1 tablet daily (Breakfast) Mirtazapine 30 mg 1 tablet daily (Bedtime) Pantoprazole 40 mg 1 tablet twice daily (Breakfast, Bedtime) Trazodone 150 mg 1 tablet daily (Bedtime) Ezetimibe (Zetia) 10 mg 1 tablet daily (Breakfast) Citalopram 10 mg 1 tablet daily (Bedtime) Gabapentin 100 mg take 2 capsules three times daily (Breakfast, Lunch, Bedtime) Losartan 100 mg 1 tablet daily (Bedtime) Albuterol 108 Inhale 2 puffs into the lungs every 6 hours prn for wheezing or shortness of breath  Patient declined medications last month: Breztri 16-9-4.8 mcg inhale 2 puffs in the morning and at bedtime - patient receives this medication through [employer] patient assistance program  Patient is due for next adherence delivery on: 04/28/2022 (Thursday) 2nd Route after 3 pm.  Called patient and reviewed medications and coordinated delivery.  This delivery to include: Hydrochlorothiazide 25 mg 1 tablet daily (Breakfast) Mirtazapine 30 mg 1 tablet daily (Bedtime) Pantoprazole 40 mg 1 tablet twice daily (Breakfast, Bedtime) Trazodone 150 mg 1 tablet daily (Bedtime) Ezetimibe (Zetia) 10 mg 1 tablet daily (Breakfast) Citalopram 10 mg 1 tablet daily (Bedtime) Losartan 100 mg 1 tablet daily (Bedtime)  Patient declined the following medications: Breztri 16-9-4.8 mcg inhale 2 puffs in the morning  and at bedtime - patient receives this medication through [employer] patient assistance program Gabapentin 100 mg take 2 capsules three times daily (Breakfast, Lunch, Bedtime)-Patient states she has more than enough of this medication right now Albuterol 108 Inhale 2 puffs into the lungs every 6 hours prn for wheezing or  shortness of breath-PRN medication  Patient needs refills for:  Ezetimibe (Zetia) 10 mg 1 tablet daily, .Citalopram 10 mg 1 tablet daily both are PCP medications that CPP can refill.  Confirmed delivery date of 04/28/2022 2nd Route, advised patient that pharmacy will contact them the morning of delivery.  I spoke with the patient and she reports that she is doing well today. Patient denies any ill symptoms and no issues with her breathing today. Patient stated last month when we talked she did take a few nebulizer treatments and they helped her a lot. Patient has no other concerns or issues today.  Patient has a telephone appointment with [name], CPP on 06/07/2022 @ 1500.  Lynann Bologna, CPA/CMA Clinical Pharmacist Assistant Phone: [phone]

## 2022-04-21 MED ORDER — CITALOPRAM HYDROBROMIDE 10 MG PO TABS: 10.0000 mg | ORAL_TABLET | Freq: Every day | ORAL | 3 refills | Status: DC

## 2022-04-21 MED ORDER — EZETIMIBE 10 MG PO TABS: 10.0000 mg | ORAL_TABLET | Freq: Every day | ORAL | 3 refills | Status: DC

## 2022-04-21 NOTE — Addendum Note (Signed)
Addended by: Daron Offer A on: 04/21/2022 03:42 PM   Modules accepted: Orders

## 2022-04-22 ENCOUNTER — Ambulatory Visit: Payer: Medicare Other

## 2022-04-22 DIAGNOSIS — I10 Essential (primary) hypertension: Secondary | ICD-10-CM

## 2022-04-22 NOTE — Patient Instructions (Signed)
Thank you for allowing the Chronic Care Management team to participate in your care. It was great speaking with you today! °

## 2022-04-22 NOTE — Chronic Care Management (AMB) (Signed)
Chronic Care Management   CCM RN Visit Note  04/22/2022 Name: GIAVONNA PFLUM MRN: [id number] DOB: [birth year]  Subjective: Crystal White is a 81 y.o. year old female who is a primary care patient of Jerrol Banana., MD. The care management team was consulted for assistance with disease management and care coordination needs.    Engaged with patient by telephone for follow up visit in response to provider referral for case management and care coordination services.   Consent to Services:  The patient was given information about Chronic Care Management services, agreed to services, and gave verbal consent prior to initiation of services.  Please see initial visit note for detailed documentation.   Assessment: Review of patient past medical history, allergies, medications, health status, including review of consultants reports, laboratory and other test data, was performed as part of comprehensive evaluation and provision of chronic care management services.   SDOH (Social Determinants of Health) assessments and interventions performed: No  CCM Care Plan  Allergies  Allergen Reactions   Iodinated Contrast Media Other (See Comments)    Burning sensation during contrast media injections. Patient states the IV contrast makes her very hot. She has had multiple CT Scans with IV contrast and states she only gets the warm sensation. This is not an allergic reaction, but is noted in her [place] chart as well. Aggie Hacker, ARRT R CT, had this discussion with her on 01/10/19 at 1130am.   Bacitracin Swelling and Rash   Statins Other (See Comments) and Rash    Reaction:  Leg cramps  Reaction:  Leg cramps     Outpatient Encounter Medications as of 04/22/2022  Medication Sig   acetaminophen (TYLENOL) 500 MG tablet Take 1,000 mg by mouth every 6 (six) hours as needed (pain/headaches.).    albuterol (PROVENTIL HFA) 108 (90 Base) MCG/ACT inhaler Inhale 2 puffs into the lungs every 6 (six)  hours as needed for wheezing or shortness of breath.   albuterol (PROVENTIL) (2.5 MG/3ML) 0.083% nebulizer solution Take 3 mLs (2.5 mg total) by nebulization every 4 (four) hours as needed for wheezing or shortness of breath.   Budeson-Glycopyrrol-Formoterol (BREZTRI AEROSPHERE) 160-9-4.8 MCG/ACT AERO Inhale 2 puffs into the lungs in the morning and at bedtime.   citalopram (CELEXA) 10 MG tablet Take 1 tablet (10 mg total) by mouth at bedtime.   ezetimibe (ZETIA) 10 MG tablet Take 1 tablet (10 mg total) by mouth daily.   gabapentin (NEURONTIN) 100 MG capsule TAKE 2 CAPSULES BY MOUTH 3  TIMES DAILY   hydrochlorothiazide (HYDRODIURIL) 25 MG tablet Take 1 tablet (25 mg total) by mouth daily.   losartan (COZAAR) 100 MG tablet TAKE 1 TABLET(100 MG) BY MOUTH DAILY   mirtazapine (REMERON) 30 MG tablet Take 1 tablet (30 mg total) by mouth at bedtime.   OXYGEN Inhale into the lungs daily. Uses at night with CPAP and PRN during daytime   pantoprazole (PROTONIX) 40 MG tablet TAKE 1 TABLET BY MOUTH TWICE  DAILY   traZODone (DESYREL) 150 MG tablet TAKE 1 TABLET BY MOUTH AT  BEDTIME   No facility-administered encounter medications on file as of 04/22/2022.    Patient Active Problem List   Diagnosis Date Noted   Wears partial dentures 07/17/2019   Incisional hernia, without obstruction or gangrene 06/03/2016   Osteoporosis 02/22/2016   Musculoskeletal chest pain 07/14/2015   Chronic respiratory failure ([place]) 07/14/2015   Acute bronchitis 07/14/2015   Other emphysema ([place]) 07/14/2015   Chronic renal insufficiency  07/14/2015   Chest pain 07/13/2015   Multiple pulmonary nodules 06/02/2015   Colon polyp 05/29/2015   Hemoptysis 04/29/2015   Sprain of ankle 04/14/2015   Anxiety 04/14/2015   Barrett's esophagus 04/14/2015   Acute exacerbation of chronic obstructive airways disease ([place]) 04/14/2015   Bursitis 04/14/2015   Cellulitis 04/14/2015   Claudication ([place]) 04/14/2015   Cough 04/14/2015    Deficiency, 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   Clinical depression 04/14/2015   Dizziness 04/14/2015   Dyslipidemia 04/14/2015   Essential (primary) hypertension 04/14/2015   Flank pain 04/14/2015   H/O suicide attempt 04/14/2015   Dysphonia 04/14/2015   Hypercholesteremia 04/14/2015   Cannot sleep 04/14/2015   Malaise and fatigue 04/14/2015   Mild cognitive impairment 04/14/2015   Cramp in muscle 04/14/2015   Muscle ache 04/14/2015   Neuropathy 04/14/2015   Adiposity 04/14/2015   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   Candida infection of mouth 04/14/2015   Abnormal loss of weight 04/14/2015   Decreased body weight 04/14/2015   Artificial cardiac pacemaker 10/08/2014   Apnea, sleep 10/08/2014   Presence of cardiac pacemaker 10/08/2014   Hyperlipidemia 10/08/2014   COPD (chronic obstructive pulmonary disease) ([place]) 02/13/2012   Tobacco abuse 02/13/2012   Sinus congestion 02/13/2012   Current tobacco use 02/13/2012   Nasal congestion 02/13/2012   Tobacco use disorder 02/13/2012   Esophagitis, reflux 08/28/2006   Abdominal pain, right lower quadrant 08/22/2005   Acute appendicitis with peritoneal abscess 08/22/2005    Patient Care Plan: RN Care Managemement Plan of Care     Problem Identified: HTN, HLD, COPD      Long-Range Goal: Disease Progression Prevented or Minimized   Start Date: 04/15/2022  Expected End Date: 07/14/2022  Priority: High  Note:   Current Barriers:  Chronic Disease Management support and education needs related to HTN, HLD, and COPD   RNCM Clinical Goal(s):  Patient will demonstrate ongoing adherence to prescribed treatment plan for HTN, HLD, and COPD through collaboration with the provider, RN Care Manager and the care team.   Interventions: 1:1 collaboration with primary care provider regarding development and update of comprehensive plan of care as evidenced by provider attestation and co-signature Inter-disciplinary care team collaboration (see  longitudinal plan of care) Evaluation of current treatment plan related to  self management and patient's adherence to plan as established by provider   Hypertension Interventions:   Reviewed plan for hypertension management. Reviewed blood pressure parameters. Reports readings have been within range. Denies episodes of chest discomfort, palpitations, or headaches. No lightheadedness or dizziness. Reports taking medications as prescribed and tolerating regimen well. Discussed nutritional intake and reviewed importance of adhering to a heart healthy diet. Reviewed s/sx of heart attack, stroke and worsening symptoms that require immediate medical attention. Expressed concerns regarding a notice received from her insurance provider. Notice indicated her current Cardiologist/Dr. Josefa Half is no longer in her network. Will follow up with the Cardiology clinic to determine if Dr. Josefa Half is practicing in a different area. Will follow up next week to conduct conference call with [facility] to discuss options if patient is required to change providers. Update 04/22/22: Collaborated with the Cardiology team. Staff reports that letters were sent out to several patients in error. Confirmed that Dr. Josefa Half remains in the patient's coverage network. The Cardiology clinic will contact her to schedule the next recommended outreach. Information discussed with patient. She is aware that the Cardiology team will contact her to schedule the next recommended outreach. Denies urgent concerns. Agreed to contact the  clinic with urgent questions if needed prior the scheduled clinic visit.  Patient Goals/Self-Care Activities: Take all medications as prescribed Attend all scheduled provider appointments Call pharmacy for medication refills 3-7 days in advance of running out of medications Perform all self care activities independently Call provider office for new concerns or questions   PLAN Will follow up in July       PLAN: A member of the care management team will follow up in July.   [name] Care Management [facility] [phone]

## 2022-04-27 DIAGNOSIS — J449 Chronic obstructive pulmonary disease, unspecified: Secondary | ICD-10-CM | POA: Diagnosis not present

## 2022-04-27 DIAGNOSIS — F17210 Nicotine dependence, cigarettes, uncomplicated: Secondary | ICD-10-CM | POA: Diagnosis not present

## 2022-04-27 DIAGNOSIS — E785 Hyperlipidemia, unspecified: Secondary | ICD-10-CM

## 2022-04-27 DIAGNOSIS — I10 Essential (primary) hypertension: Secondary | ICD-10-CM

## 2022-05-17 ENCOUNTER — Ambulatory Visit
Admission: RE | Admit: 2022-05-17 | Discharge: 2022-05-17 | Disposition: A | Discharge status: Home / Self Care | Payer: Medicare Other | Source: Ambulatory Visit | Attending: Acute Care | Admitting: Acute Care

## 2022-05-17 DIAGNOSIS — J438 Other emphysema: Secondary | ICD-10-CM | POA: Insufficient documentation

## 2022-05-17 DIAGNOSIS — F17210 Nicotine dependence, cigarettes, uncomplicated: Secondary | ICD-10-CM | POA: Diagnosis not present

## 2022-05-17 DIAGNOSIS — I700 Atherosclerosis of aorta: Secondary | ICD-10-CM | POA: Insufficient documentation

## 2022-05-17 DIAGNOSIS — J432 Centrilobular emphysema: Secondary | ICD-10-CM | POA: Insufficient documentation

## 2022-05-17 DIAGNOSIS — R911 Solitary pulmonary nodule: Secondary | ICD-10-CM | POA: Diagnosis not present

## 2022-05-17 DIAGNOSIS — Z122 Encounter for screening for malignant neoplasm of respiratory organs: Secondary | ICD-10-CM | POA: Diagnosis not present

## 2022-05-17 DIAGNOSIS — Z87891 Personal history of nicotine dependence: Secondary | ICD-10-CM | POA: Insufficient documentation

## 2022-05-18 ENCOUNTER — Telehealth: Payer: Self-pay

## 2022-05-18 DIAGNOSIS — E7800 Pure hypercholesterolemia, unspecified: Secondary | ICD-10-CM

## 2022-05-18 NOTE — Progress Notes (Signed)
Chronic Care Management Pharmacy Assistant   Name: Crystal White  MRN: [id number] DOB: 06/18/1941  Reason for Encounter: Medication Review/Medication Coordination Call   Recent office visits:  None ID  Recent consult visits:  None ID  Hospital visits:  None in previous 6 months  Medications: Outpatient Encounter Medications as of 05/18/2022  Medication Sig   acetaminophen (TYLENOL) 500 MG tablet Take 1,000 mg by mouth every 6 (six) hours as needed (pain/headaches.).    albuterol (PROVENTIL HFA) 108 (90 Base) MCG/ACT inhaler Inhale 2 puffs into the lungs every 6 (six) hours as needed for wheezing or shortness of breath.   albuterol (PROVENTIL) (2.5 MG/3ML) 0.083% nebulizer solution Take 3 mLs (2.5 mg total) by nebulization every 4 (four) hours as needed for wheezing or shortness of breath.   Budeson-Glycopyrrol-Formoterol (BREZTRI AEROSPHERE) 160-9-4.8 MCG/ACT AERO Inhale 2 puffs into the lungs in the morning and at bedtime.   citalopram (CELEXA) 10 MG tablet Take 1 tablet (10 mg total) by mouth at bedtime.   ezetimibe (ZETIA) 10 MG tablet Take 1 tablet (10 mg total) by mouth daily.   gabapentin (NEURONTIN) 100 MG capsule TAKE 2 CAPSULES BY MOUTH 3  TIMES DAILY   hydrochlorothiazide (HYDRODIURIL) 25 MG tablet Take 1 tablet (25 mg total) by mouth daily.   losartan (COZAAR) 100 MG tablet TAKE 1 TABLET(100 MG) BY MOUTH DAILY   mirtazapine (REMERON) 30 MG tablet Take 1 tablet (30 mg total) by mouth at bedtime.   OXYGEN Inhale into the lungs daily. Uses at night with CPAP and PRN during daytime   pantoprazole (PROTONIX) 40 MG tablet TAKE 1 TABLET BY MOUTH TWICE  DAILY   traZODone (DESYREL) 150 MG tablet TAKE 1 TABLET BY MOUTH AT  BEDTIME   No facility-administered encounter medications on file as of 05/18/2022.   Care Gaps: Hepatitis C Screening COVID-19 Vaccine Booster 4 Zoster Vaccine  Star Rating Drugs: Losartan 100 mg last filled on 04/21/2022 for a 30-Day supply with  [facility]  BP Readings from Last 3 Encounters:  10/12/21 140/70  09/06/21 (!) 162/96  04/02/21 (!) 170/84    Lab Results  Component Value Date   HGBA1C 5.5 01/24/2017    Patient obtains medications through Adherence Packaging  30 Days   Last adherence delivery included:  Hydrochlorothiazide 25 mg 1 tablet daily (Breakfast) Mirtazapine 30 mg 1 tablet daily (Bedtime) Pantoprazole 40 mg 1 tablet twice daily (Breakfast, Bedtime) Trazodone 150 mg 1 tablet daily (Bedtime) Ezetimibe (Zetia) 10 mg 1 tablet daily (Breakfast) Citalopram 10 mg 1 tablet daily (Bedtime) Losartan 100 mg 1 tablet daily (Bedtime)   Patient declined medications for the Month of May: Breztri 16-9-4.8 mcg inhale 2 puffs in the morning and at bedtime - patient receives this medication through [employer] patient assistance program Gabapentin 100 mg take 2 capsules three times daily (Breakfast, Lunch, Bedtime)-Patient states she has more than enough of this medication right now Albuterol 108 Inhale 2 puffs into the lungs every 6 hours prn for wheezing or shortness of breath-PRN medication  Patient is due for next adherence delivery on: 05/30/2022 (Monday) [facility].  Called patient and reviewed medications and coordinated delivery.  This delivery to include: Hydrochlorothiazide 25 mg 1 tablet daily (Breakfast) Mirtazapine 30 mg 1 tablet daily (Bedtime) Pantoprazole 40 mg 1 tablet twice daily (Breakfast, Bedtime) Trazodone 150 mg 1 tablet daily (Bedtime) Ezetimibe (Zetia) 10 mg 1 tablet daily (Breakfast) Citalopram 10 mg 1 tablet daily (Bedtime) Losartan 100 mg 1 tablet daily (Bedtime)  Patient declined the following medications: Breztri 16-9-4.8 mcg inhale 2 puffs in the morning and at bedtime - patient receives this medication through [employer] patient assistance program Gabapentin 100 mg take 2 capsules three times daily (Breakfast, Lunch, Bedtime)-Patient states she has more than enough of this medication right  now Albuterol 108 Inhale 2 puffs into the lungs every 6 hours prn for wheezing or shortness of breath-PRN medication  Patient needs refills for Ezetimibe 10 mg and Citalopram 10 mg both medications are PCP medications so CPP can send refills.  Confirmed delivery date of 05/30/2022 [address], advised patient that pharmacy will contact them the morning of delivery.  I spoke to the patient and she reports that for the most part she is doing well. Patient stated she did fall a few days ago. Per patient she did hit her head on her door and has a small knot, she has some scratches and is a little sore. Patient stated this was due to an inner ear issue and dizziness. I inquired with the patient did she go to an urgent care or the ER and she advised no. I also offered to see if we could get a follow-up with PCP but per patient she stated she is doing well and doesn't feel like at this time she needs to see anyone for her fall. I encouraged patient to give [place] a call if something changes with her health. Patient verbalized understanding.   Lynann Bologna, CPA/CMA Clinical Pharmacist Assistant Phone: [phone]

## 2022-05-19 ENCOUNTER — Telehealth: Payer: Self-pay | Admitting: Acute Care

## 2022-05-19 MED ORDER — EZETIMIBE 10 MG PO TABS: 10.0000 mg | ORAL_TABLET | Freq: Every day | ORAL | 3 refills | Status: AC

## 2022-05-19 MED ORDER — CITALOPRAM HYDROBROMIDE 10 MG PO TABS: 10.0000 mg | ORAL_TABLET | Freq: Every day | ORAL | 3 refills | Status: AC

## 2022-05-19 NOTE — Addendum Note (Signed)
Addended by: Daron Offer A on: 05/19/2022 12:29 PM   Modules accepted: Orders

## 2022-05-19 NOTE — Telephone Encounter (Signed)
IMPRESSION: Lung-RADS 4A, suspicious. Slight progression of a 5.7 mm subpleural right lower lobe pulmonary nodule. While not highly suspicious, follow up low-dose chest CT without contrast in 3 months (please use the following order, "CT CHEST LCS NODULE FOLLOW-UP W/O CM") is recommended. Alternatively, PET may be considered when there is a solid component 8mm or larger.   Aortic Atherosclerosis (ICD10-I70.0) and Emphysema (ICD10-J43.9).   These results will be called to the ordering clinician or representative by the Radiologist Assistant, and communication documented in the PACS or [employer].     Electronically Signed   By: Kennith Center M.D.   On: 05/19/2022 07:53

## 2022-05-20 ENCOUNTER — Telehealth: Payer: Self-pay | Admitting: Acute Care

## 2022-05-20 DIAGNOSIS — F17210 Nicotine dependence, cigarettes, uncomplicated: Secondary | ICD-10-CM

## 2022-05-20 DIAGNOSIS — R911 Solitary pulmonary nodule: Secondary | ICD-10-CM

## 2022-05-20 DIAGNOSIS — Z87891 Personal history of nicotine dependence: Secondary | ICD-10-CM

## 2022-05-20 NOTE — Telephone Encounter (Signed)
CT results faxed to PCP with f/u plans included. Oder placed for 3 mth nodule f/u low dose CT.

## 2022-05-20 NOTE — Telephone Encounter (Signed)
Spoke to patient and relayed below message. She voiced her understanding.  She stated that she wears oxygen as needed, however she will start wearing it all times.

## 2022-05-20 NOTE — Telephone Encounter (Addendum)
Spoke to patient.  She stated that sx are baseline. C/o SOB with exertion and prod cough with white sputum. She stated that spo2 has dropped as low as 81% on roomair. Spo2 recovers to 91% with rest.  She will keep scheduled appt.  Routing to Dr. Jayme Cloud as an Lorain Childes

## 2022-06-01 ENCOUNTER — Telehealth: Payer: Self-pay | Admitting: Pulmonary Disease

## 2022-06-01 DIAGNOSIS — J449 Chronic obstructive pulmonary disease, unspecified: Secondary | ICD-10-CM

## 2022-06-01 NOTE — Telephone Encounter (Signed)
Order placed to adapt. Patient is aware and voiced her understanding.  Nothing further needed.

## 2022-06-01 NOTE — Telephone Encounter (Signed)
Ok to order 

## 2022-06-01 NOTE — Telephone Encounter (Signed)
Spoke to patient.  She is requesting order for POC for upcoming trip. She wears 4L with exertion and 3L at rest.  Last seen 03/2021. Pending appt 06/15/2022.  Dr. Patsey Berthold, please advise. Thanks

## 2022-06-07 ENCOUNTER — Ambulatory Visit: Payer: Medicare Other

## 2022-06-07 NOTE — Progress Notes (Unsigned)
Chronic Care Management Pharmacy Note  06/08/2022 Name:  Crystal White MRN:  [id number] DOB:  1941-08-10  Summary: Patient presents for CCM follow-up. Patient reports a fall in the past week, dizziness. Did not lose consciousness. She has a walker, but was not using it when she fell.   Patient had large supply of medications from before she started utilizing [facility] and wanted to finish up as much of the medications as possible. We reviewed each medication and discussed her supply remaining and when her medication will expire.   Recommendations/Changes made from today's visit: Continue current medications  Re-synced patient medications for pharmacy.   Plan: CPP follow-up in 3 months  Subjective: Crystal White is an 81 y.o. year old female who is a primary patient of Jerrol Banana., MD.  The CCM team was consulted for assistance with disease management and care coordination needs.    Engaged with patient by telephone for follow up visit in response to provider referral for pharmacy case management and/or care coordination services.   Consent to Services:  The patient was given information about Chronic Care Management services, agreed to services, and gave verbal consent prior to initiation of services.  Please see initial visit note for detailed documentation.   Patient Care Team: Jerrol Banana., MD as PCP - General (Unknown Physician Specialty) Samara Deist, DPM as Referring Physician (Podiatry) Isaias Cowman, MD as Consulting Physician (Cardiology) Neldon Labella, RN as Case Manager Germaine Pomfret, [facility] (Pharmacist) Tyler Pita, MD as Consulting Physician (Pulmonary Disease)  Recent office visits: 10/12/21: Patient presented to Dr. Rosanna Randy for follow-up.   Recent consult visits: None in past 6 months.   Hospital visits: None in past 6 months.    Objective:  Lab Results  Component Value Date   CREATININE 1.69 (H) 10/19/2021    BUN 16 10/19/2021   GFRNONAA 28 (L) 11/11/2020   GFRAA 31 (L) 08/11/2020   NA 144 10/19/2021   K 4.0 10/19/2021   CALCIUM 9.0 10/19/2021   CO2 25 10/19/2021   GLUCOSE 100 (H) 10/19/2021    Lab Results  Component Value Date/Time   HGBA1C 5.5 01/24/2017 11:32 AM    Last diabetic Eye exam: No results found for: "HMDIABEYEEXA"  Last diabetic Foot exam: No results found for: "HMDIABFOOTEX"   Lab Results  Component Value Date   CHOL 216 (H) 10/19/2021   HDL 60 10/19/2021   LDLCALC 126 (H) 10/19/2021   TRIG 169 (H) 10/19/2021   CHOLHDL 3.6 10/19/2021       Latest Ref Rng & Units 10/19/2021    8:58 AM 08/11/2020   10:46 AM 02/13/2020    4:42 PM  Hepatic Function  Total Protein 6.0 - 8.5 g/dL 6.4  6.9  6.6   Albumin 3.7 - 4.7 g/dL 3.9  4.3  4.1   AST 0 - 40 IU/L _0 ALT 0 - 32 IU/L _1 Alk Phosphatase 44 - 121 IU/L 86  89  95   Total Bilirubin 0.0 - 1.2 mg/dL 0.4  0.3  0.3     Lab Results  Component Value Date/Time   TSH 1.670 10/19/2021 08:58 AM   TSH 1.160 08/11/2020 10:46 AM       Latest Ref Rng & Units 10/19/2021    8:58 AM 11/11/2020    1:31 PM 08/11/2020   10:46 AM  CBC  WBC 3.4 - 10.8 x10E3/uL 7.9  7.6  9.4   Hemoglobin 11.1 - 15.9 g/dL 14.8  15.6  15.3   Hematocrit 34.0 - 46.6 % 44.5  47.4  46.5   Platelets 150 - 450 x10E3/uL 229  188  197     No results found for: "VD25OH"  Clinical ASCVD: No  The 10-year ASCVD risk score (Arnett DK, et al., 2019) is: 51.2%   Values used to calculate the score:     Age: 81 years     Sex: Female     Is Non-Hispanic African American: No     Diabetic: No     Tobacco smoker: Yes     Systolic Blood Pressure: 127 mmHg     Is BP treated: Yes     HDL Cholesterol: 60 mg/dL     Total Cholesterol: 216 mg/dL       01/06/2022    2:20 PM 01/12/2021   11:14 AM 10/29/2020    8:59 AM  Depression screen PHQ 2/9  Decreased Interest 0 0 0  Down, Depressed, Hopeless 0 0 0  PHQ - 2 Score 0 0 0    Social History    Tobacco Use  Smoking Status Every Day   Packs/day: 1.00   Years: 55.00   Total pack years: 55.00   Types: Cigarettes  Smokeless Tobacco Never  Tobacco Comments   Currently smoking a pack/day   BP Readings from Last 3 Encounters:  10/12/21 140/70  09/06/21 (!) 162/96  04/02/21 (!) 170/84   Pulse Readings from Last 3 Encounters:  10/12/21 71  09/06/21 83  04/02/21 78   Wt Readings from Last 3 Encounters:  10/12/21 162 lb (73.5 kg)  04/02/21 152 lb 6.4 oz (69.1 kg)  02/02/21 140 lb (63.5 kg)   BMI Readings from Last 3 Encounters:  10/12/21 30.61 kg/m  04/02/21 28.61 kg/m  02/02/21 23.66 kg/m    Assessment/Interventions: Review of patient past medical history, allergies, medications, health status, including review of consultants reports, laboratory and other test data, was performed as part of comprehensive evaluation and provision of chronic care management services.   SDOH:  (Social Determinants of Health) assessments and interventions performed: Yes    SDOH Screenings   Alcohol Screen: Low Risk  (01/06/2022)   Alcohol Screen    Last Alcohol Screening Score (AUDIT): 0  Depression (PHQ2-9): Low Risk  (01/06/2022)   Depression (PHQ2-9)    PHQ-2 Score: 0  Financial Resource Strain: Low Risk  (01/06/2022)   Overall Financial Resource Strain (CARDIA)    Difficulty of Paying Living Expenses: Not hard at all  Food Insecurity: No Food Insecurity (01/06/2022)   Hunger Vital Sign    Worried About Running Out of Food in the Last Year: Never true    Ran Out of Food in the Last Year: Never true  Housing: Low Risk  (01/06/2022)   Housing    Last Housing Risk Score: 0  Physical Activity: Insufficiently Active (01/06/2022)   Exercise Vital Sign    Days of Exercise per Week: 3 days    Minutes of Exercise per Session: 10 min  Social Connections: Moderately Isolated (01/06/2022)   Social Connection and Isolation Panel [NHANES]    Frequency of Communication with Friends and Family:  More than three times a week    Frequency of Social Gatherings with Friends and Family: Once a week    Attends Religious Services: More than 4 times per year    Active Member of Clubs or Organizations: No    Attends  Club or Organization Meetings: Never    Marital Status: Widowed  Stress: No Stress Concern Present (01/06/2022)   [place]    Feeling of Stress : Not at all  Tobacco Use: High Risk (01/06/2022)   Patient History    Smoking Tobacco Use: Every Day    Smokeless Tobacco Use: Never    Passive Exposure: Not on file  Transportation Needs: No Transportation Needs (01/06/2022)   PRAPARE - Transportation    Lack of Transportation (Medical): No    Lack of Transportation (Non-Medical): No    CCM Care Plan  Allergies  Allergen Reactions   Iodinated Contrast Media Other (See Comments)    Burning sensation during contrast media injections. Patient states the IV contrast makes her very hot. She has had multiple CT Scans with IV contrast and states she only gets the warm sensation. This is not an allergic reaction, but is noted in her [facility] chart as well. Aggie Hacker, ARRT R CT, had this discussion with her on 01/10/19 at 1130am.   Bacitracin Swelling and Rash   Statins Other (See Comments) and Rash    Reaction:  Leg cramps  Reaction:  Leg cramps     Medications Reviewed Today     Reviewed by Neldon Labella, RN (Registered Nurse) on 04/22/22 at [place] List Status: <None>   Medication Order Taking? Sig Documenting Provider Last Dose Status Informant  acetaminophen (TYLENOL) 500 MG tablet [id number] No Take 1,000 mg by mouth every 6 (six) hours as needed (pain/headaches.).  [provider] Taking Active Self  albuterol (PROVENTIL HFA) 108 (90 Base) MCG/ACT inhaler [id number] No Inhale 2 puffs into the lungs every 6 (six) hours as needed for wheezing or shortness of breath. Jerrol Banana., MD  Taking Active   albuterol (PROVENTIL) (2.5 MG/3ML) 0.083% nebulizer solution [id number] No Take 3 mLs (2.5 mg total) by nebulization every 4 (four) hours as needed for wheezing or shortness of breath. Jerrol Banana., MD Taking Active   Budeson-Glycopyrrol-Formoterol [facility] AEROSPHERE) 160-9-4.8 MCG/ACT Hollie Salk [id number] No Inhale 2 puffs into the lungs in the morning and at bedtime. Jerrol Banana., MD Taking Active   citalopram (CELEXA) 10 MG tablet [id number]  Take 1 tablet (10 mg total) by mouth at bedtime. Jerrol Banana., MD  Active   ezetimibe (ZETIA) 10 MG tablet [id number]  Take 1 tablet (10 mg total) by mouth daily. Jerrol Banana., MD  Active   gabapentin (NEURONTIN) 100 MG capsule [id number] No TAKE 2 CAPSULES BY MOUTH 3  TIMES DAILY Jerrol Banana., MD Taking Active   hydrochlorothiazide (HYDRODIURIL) 25 MG tablet [id number] No Take 1 tablet (25 mg total) by mouth daily. Jerrol Banana., MD Taking Active   losartan (COZAAR) 100 MG tablet [id number]  TAKE 1 TABLET(100 MG) BY MOUTH DAILY Jerrol Banana., MD  Active   mirtazapine (REMERON) 30 MG tablet [id number] No Take 1 tablet (30 mg total) by mouth at bedtime. Jerrol Banana., MD Taking Active   OXYGEN [id number] No Inhale into the lungs daily. Uses at night with CPAP and PRN during daytime [provider] Taking Active Self  pantoprazole (PROTONIX) 40 MG tablet [id number] No TAKE 1 TABLET BY MOUTH TWICE  DAILY Jerrol Banana., MD Taking Active   traZODone (DESYREL) 150 MG tablet [id number] No TAKE 1 TABLET BY MOUTH AT  BEDTIME Jerrol Banana., MD Taking  Active             Patient Active Problem List   Diagnosis Date Noted   Wears partial dentures 07/17/2019   Incisional hernia, without obstruction or gangrene 06/03/2016   Osteoporosis 02/22/2016   Musculoskeletal chest pain 07/14/2015   Chronic respiratory failure ([place]) 07/14/2015   Acute bronchitis  07/14/2015   Other emphysema ([place]) 07/14/2015   Chronic renal insufficiency 07/14/2015   Chest pain 07/13/2015   Multiple pulmonary nodules 06/02/2015   Colon polyp 05/29/2015   Hemoptysis 04/29/2015   Sprain of ankle 04/14/2015   Anxiety 04/14/2015   Barrett's esophagus 04/14/2015   Acute exacerbation of chronic obstructive airways disease ([place]) 04/14/2015   Bursitis 04/14/2015   Cellulitis 04/14/2015   Claudication ([place]) 04/14/2015   Cough 04/14/2015   Deficiency, 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   Clinical depression 04/14/2015   Dizziness 04/14/2015   Dyslipidemia 04/14/2015   Essential (primary) hypertension 04/14/2015   Flank pain 04/14/2015   H/O suicide attempt 04/14/2015   Dysphonia 04/14/2015   Hypercholesteremia 04/14/2015   Cannot sleep 04/14/2015   Malaise and fatigue 04/14/2015   Mild cognitive impairment 04/14/2015   Cramp in muscle 04/14/2015   Muscle ache 04/14/2015   Neuropathy 04/14/2015   Adiposity 04/14/2015   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   Candida infection of mouth 04/14/2015   Abnormal loss of weight 04/14/2015   Decreased body weight 04/14/2015   Artificial cardiac pacemaker 10/08/2014   Apnea, sleep 10/08/2014   Presence of cardiac pacemaker 10/08/2014   Hyperlipidemia 10/08/2014   COPD (chronic obstructive pulmonary disease) ([place]) 02/13/2012   Tobacco abuse 02/13/2012   Sinus congestion 02/13/2012   Current tobacco use 02/13/2012   Nasal congestion 02/13/2012   Tobacco use disorder 02/13/2012   Esophagitis, reflux 08/28/2006   Abdominal pain, right lower quadrant 08/22/2005   Acute appendicitis with peritoneal abscess 08/22/2005    Immunization History  Administered Date(s) Administered   Fluad Quad(high Dose 65+) 08/11/2020, 10/12/2021   Influenza Split 09/20/2006, 09/02/2010, 09/10/2012   Influenza Whole 08/28/2012   Influenza, High Dose Seasonal PF 08/28/2014, 08/10/2015, 07/25/2016, 10/03/2017, 08/30/2018, 09/18/2019    Influenza-Unspecified 08/28/2013   Moderna SARS-COV2 Booster Vaccination 10/12/2020   Moderna Sars-Covid-2 Vaccination 01/09/2020, 02/06/2020, 05/19/2021   Pneumococcal Conjugate-13 08/28/2014   Pneumococcal Polysaccharide-23 03/07/2011   Tdap 08/28/2014   Zoster Recombinat (Shingrix) 05/19/2021   Zoster, Live 11/04/2012    Conditions to be addressed/monitored:  Hypertension, Hyperlipidemia, GERD, COPD, Depression, Anxiety, Osteoporosis, and Tobacco use  Care Plan : General Pharmacy (Adult)  Updates made by Germaine Pomfret, RPH since 06/08/2022 12:00 AM     Problem: Hypertension, Hyperlipidemia, GERD, COPD, Depression, Anxiety, Osteoporosis, and Tobacco use   Priority: High     Long-Range Goal: Patient-Specific Goal   Start Date: 06/21/2021  Expected End Date: 06/09/2023  This Visit's Progress: On track  Recent Progress: On track  Priority: High  Note:   Current Barriers:  Unable to achieve control of blood pressure  Unable to achieve control of cholesterol  Pharmacist Clinical Goal(s):  Patient will achieve control of blood pressure as evidenced by BP less than 140/90 achieve control of cholesterol as evidenced by LDL less than 100 through collaboration with PharmD and provider.   Interventions: 1:1 collaboration with Jerrol Banana., MD regarding development and update of comprehensive plan of care as evidenced by provider attestation and co-signature Inter-disciplinary care team collaboration (see longitudinal plan of care) Comprehensive medication review performed; medication list updated in electronic medical record  Hypertension (  BP goal <140/90) -Uncontrolled -Current treatment: HCTZ 25 mg daily  Losartan 100 mg daily  -Medications previously tried: Amlodipine (fatigue),   -Current home readings:  -Current dietary habits: Salts food. Drinks 2 cups caffeine.  -Current exercise habits: Minimal, limited by COPD, breathlessness -Denies  hypotensive/hypertensive symptoms -Recommended to continue current medication  Hyperlipidemia: (LDL goal < 100) -Uncontrolled -Current treatment: Ezetimibe 10 mg daily  -Medications previously tried: Statins   -Educated on Importance of limiting foods high in cholesterol; -Recommended to continue current medication  COPD (Goal: control symptoms and prevent exacerbations) -Controlled -Current treatment  Albuterol 2 puffs every 6 hours as needed Breztri 2 puffs twice daily  -Medications previously tried: NA  -Gold Grade: Gold 2 (FEV1 50-79%) -Current COPD Classification:  B (high sx, <2 exacerbations/yr) -Pulmonary function testing: FEV1 65% (2013) -Exacerbations requiring treatment in last 6 months: No -Recommended to continue current medication  Depression/Anxiety (Goal: Maintain stable mood) -Controlled -Current treatment: Citalopram 10 mg nightly  Mirtazapine 30 mg nightly  Trazodone 150 mg nightly  -Medications previously tried/failed: NA -7 hours sleep. Takes 3 hours to fall asleep. Does wake up multiple times, falls back asleep quickly. -PHQ9: 0 -GAD7: 0 -Recommended to continue current medication  GERD (Goal: Prevent Heartburn) -Controlled -History of Hitial hernia, Barrett's Esophagus   -Current treatment  Pantoprazole 40 mg twice daily  -Medications previously tried: NA  -Very rarely has symptoms. Has only ever taken twice daily.  -Recommended to continue current medication  Chronic Kidney Disease Stage 3b  -All medications assessed for renal dosing and appropriateness in chronic kidney disease. -Recommended to continue current medication  Patient Goals/Self-Care Activities Patient will:  - check blood pressure every other day, document, and provide at future appointments  Follow Up Plan: Telephone follow up appointment with care management team member scheduled for:  08/09/2022 at 3:00 PM     Medication Assistance:  Breztri obtained through [employer]  medication assistance program.  Enrollment ends NA  Compliance/Adherence/Medication fill history: Care Gaps: Hepatitis C Screening  Star-Rating Drugs: Losartan 100 mg last filled on 05/28/2021 for a [facility]-Day supply with [facility]  Patient's preferred pharmacy is:  [facility] [address] [place] [address] Phone: [phone] Fax: [place], [place] - [address] [place] [address] Phone: [phone] Fax: [phone]  [facility] - [place], [address] [place] [address] Phone: [phone] Fax: [place], [place] [facility] [address] [place] [address] Phone: [phone] Fax: [phone]   Uses pill box? Yes Pt endorses 100% compliance  Patient decided to: Utilize [facility] for medication synchronization, packaging and delivery  Care Plan and Follow Up Patient Decision:  Patient agrees to Care Plan and Follow-up.  Plan: Telephone follow up appointment with care management team member scheduled for:  08/09/2022 at 3:00 PM  [name], PharmD, [place], [place] Pharmacist Practitioner  [facility] [address] [phone]

## 2022-06-08 NOTE — Patient Instructions (Signed)
Visit Information It was great speaking with you today!  Please let me know if you have any questions about our visit.   Goals Addressed             This Visit's Progress    Track and Manage My Blood Pressure-Hypertension       Timeframe:  Long-Range Goal Priority:  High Start Date:  06/21/2021                           Expected End Date:  06/22/2023                     Follow Up within 30 days   - check blood pressure 3 times per week    Why is this important?   You won't feel high blood pressure, but it can still hurt your blood vessels.  High blood pressure can cause heart or kidney problems. It can also cause a stroke.  Making lifestyle changes like losing a little weight or eating less salt will help.  Checking your blood pressure at home and at different times of the day can help to control blood pressure.  If the doctor prescribes medicine remember to take it the way the doctor ordered.  Call the office if you cannot afford the medicine or if there are questions about it.     Notes:        Patient Care Plan: General Pharmacy (Adult)     Problem Identified: Hypertension, Hyperlipidemia, GERD, COPD, Depression, Anxiety, Osteoporosis, and Tobacco use   Priority: High     Long-Range Goal: Patient-Specific Goal   Start Date: 06/21/2021  Expected End Date: 06/09/2023  This Visit's Progress: On track  Recent Progress: On track  Priority: High  Note:   Current Barriers:  Unable to achieve control of blood pressure  Unable to achieve control of cholesterol  Pharmacist Clinical Goal(s):  Patient will achieve control of blood pressure as evidenced by BP less than 140/90 achieve control of cholesterol as evidenced by LDL less than 100 through collaboration with PharmD and provider.   Interventions: 1:1 collaboration with Jerrol Banana., MD regarding development and update of comprehensive plan of care as evidenced by provider attestation and  co-signature Inter-disciplinary care team collaboration (see longitudinal plan of care) Comprehensive medication review performed; medication list updated in electronic medical record  Hypertension (BP goal <140/90) -Uncontrolled -Current treatment: HCTZ 25 mg daily  Losartan 100 mg daily  -Medications previously tried: Amlodipine (fatigue),   -Current home readings:  -Current dietary habits: Salts food. Drinks 2 cups caffeine.  -Current exercise habits: Minimal, limited by COPD, breathlessness -Denies hypotensive/hypertensive symptoms -Recommended to continue current medication  Hyperlipidemia: (LDL goal < 100) -Uncontrolled -Current treatment: Ezetimibe 10 mg daily  -Medications previously tried: Statins   -Educated on Importance of limiting foods high in cholesterol; -Recommended to continue current medication  COPD (Goal: control symptoms and prevent exacerbations) -Controlled -Current treatment  Albuterol 2 puffs every 6 hours as needed Breztri 2 puffs twice daily  -Medications previously tried: NA  -Gold Grade: Gold 2 (FEV1 50-79%) -Current COPD Classification:  B (high sx, <2 exacerbations/yr) -Pulmonary function testing: FEV1 65% (2013) -Exacerbations requiring treatment in last 6 months: No -Recommended to continue current medication  Depression/Anxiety (Goal: Maintain stable mood) -Controlled -Current treatment: Citalopram 10 mg nightly  Mirtazapine 30 mg nightly  Trazodone 150 mg nightly  -Medications previously tried/failed: NA -7 hours sleep. Takes  3 hours to fall asleep. Does wake up multiple times, falls back asleep quickly. -PHQ9: 0 -GAD7: 0 -Recommended to continue current medication  GERD (Goal: Prevent Heartburn) -Controlled -History of Hitial hernia, Barrett's Esophagus   -Current treatment  Pantoprazole 40 mg twice daily  -Medications previously tried: NA  -Very rarely has symptoms. Has only ever taken twice daily.  -Recommended to continue  current medication  Chronic Kidney Disease Stage 3b  -All medications assessed for renal dosing and appropriateness in chronic kidney disease. -Recommended to continue current medication  Patient Goals/Self-Care Activities Patient will:  - check blood pressure every other day, document, and provide at future appointments  Follow Up Plan: Telephone follow up appointment with care management team member scheduled for:  08/09/2022 at 3:00 PM    Patient agreed to services and verbal consent obtained.   Patient verbalizes understanding of instructions and care plan provided today and agrees to view in [place]. Active MyChart status and patient understanding of how to access instructions and care plan via MyChart confirmed with patient.     [name], PharmD, Para [place], CPP  Clinical Pharmacist Practitioner  [facility] [phone]

## 2022-06-09 ENCOUNTER — Ambulatory Visit: Payer: Medicare Other | Admitting: Family Medicine

## 2022-06-15 ENCOUNTER — Ambulatory Visit: Payer: Medicare Other | Admitting: Primary Care

## 2022-06-15 ENCOUNTER — Encounter: Payer: Self-pay | Admitting: Primary Care

## 2022-06-15 VITALS — BP 130/62 | HR 76 | Temp 97.7°F | Ht 65.0 in | Wt 146.0 lb

## 2022-06-15 DIAGNOSIS — J449 Chronic obstructive pulmonary disease, unspecified: Secondary | ICD-10-CM | POA: Diagnosis not present

## 2022-06-15 DIAGNOSIS — G4733 Obstructive sleep apnea (adult) (pediatric): Secondary | ICD-10-CM

## 2022-06-15 NOTE — Progress Notes (Signed)
$'@Patient'Y$  ID: Crystal White, female    DOB: [birth year], 81 y.o.   MRN: [id number]  Chief Complaint  Patient presents with   Follow-up    Wearing cpap avg 10-12hr nightly- pressure and mask is okay. C/o SOB with exertion.     Referring provider: Jerrol White.,*  HPI: 81 year old female, current everyday smoker.  Past medical history significant for hypertension, COPD, chronic respiratory failure, multiple pulmonary nodules, obstructive sleep apnea, chronic renal insufficiency, hyperlipidemia.  Patient of Dr. Patsey White, last seen in May 2022.  06/15/2022 Patient presents today for overdue follow-up.  She was last seen in May 2022.  During her last office visit she was changed from Crystal White to Crystal White. Breathing wise she has been doing well. She continues to use Breztri inhaler.  She tried very hard to quit smoking, she went to smoking cessation classes and was down to 5 cigarettes a day but she is not ready to quit smoking. She has used nicotine patches and chantix in the past and both gave her nightmares. She had pulmonary function testing on 04/13/2021 that showed severe obstructive airway disease without significant bronchodilator response.  Evidence of air trapping and hyperinflation.   Patient has a history of mild obstructive sleep apnea.  Sleep study from September 2016 done with [facility], AHI 14.3 an hour. Severe snoring noted throughout study.  She reports compliance with CPAP at night and with naps. Her machine is likely >81 years old. She is sleeping well at night. No issues with mask fit or pressure settings. She wears a full face mask.   Icode Report date 06/16/2022.  Average usage 30/30 days (100%).  Average daily usage 9 hours 54 minutes.  Average pressure 9 cm H2O (95%).  Average AHI 0.  High leak time 0%.   Allergies  Allergen Reactions   Iodinated Contrast Media Other (See Comments)    Burning sensation during contrast media injections. Patient states  the IV contrast makes her very hot. She has had multiple CT Scans with IV contrast and states she only gets the warm sensation. This is not an allergic reaction, but is noted in her Crystal White chart as well. Crystal White, ARRT R CT, had this discussion with her on 01/10/19 at 1130am.   Bacitracin Swelling and Rash   Statins Other (See Comments) and Rash    Reaction:  Leg cramps  Reaction:  Leg cramps     Immunization History  Administered Date(s) Administered   Fluad Quad(high Dose 65+) 08/11/2020, 10/12/2021   Influenza Split 09/20/2006, 09/02/2010, 09/10/2012   Influenza Whole 08/28/2012   Influenza, High Dose Seasonal PF 08/28/2014, 08/10/2015, 07/25/2016, 10/03/2017, 08/30/2018, 09/18/2019   Influenza-Unspecified 08/28/2013   Moderna SARS-COV2 Booster Vaccination 10/12/2020   Moderna Sars-Covid-2 Vaccination 01/09/2020, 02/06/2020, 05/19/2021   Pneumococcal Conjugate-13 08/28/2014   Pneumococcal Polysaccharide-23 03/07/2011   Tdap 08/28/2014   Zoster Recombinat (Shingrix) 05/19/2021   Zoster, Live 11/04/2012    Past Medical History:  Diagnosis Date   Arthritis    hands, lower back   Barrett's esophagus    COPD (chronic obstructive pulmonary disease) (HCC)    Emphysema lung (HCC)    GERD (gastroesophageal reflux disease)    Hernia of abdominal cavity    Hyperlipidemia    Hypertension    Mobitz type II atrioventricular block    OSA (obstructive sleep apnea)    on CPAP   Pacemaker    Peripheral neuropathy    Presence of permanent cardiac pacemaker 09/22/2011   Biotronik -  EVIADR-T [facility])   Shortness of breath dyspnea    Stomach ulcer     Tobacco History: Social History   Tobacco Use  Smoking Status Every Day   Packs/day: 1.00   Years: 55.00   Total pack years: 55.00   Types: Cigarettes  Smokeless Tobacco Never  Tobacco Comments   15 cigarettes daily-06/15/2022   Ready to quit: Not Answered Counseling given: Not Answered Tobacco comments: 15  cigarettes daily-06/15/2022   Outpatient Medications Prior to Visit  Medication Sig Dispense Refill   acetaminophen (TYLENOL) 500 MG tablet Take 1,000 mg by mouth every 6 (six) hours as needed (pain/headaches.).      albuterol (PROVENTIL HFA) 108 (90 Base) MCG/ACT inhaler Inhale 2 puffs into the lungs every 6 (six) hours as needed for wheezing or shortness of breath. 3 each 3   albuterol (PROVENTIL) (2.5 MG/3ML) 0.083% nebulizer solution Take 3 mLs (2.5 mg total) by nebulization every 4 (four) hours as needed for wheezing or shortness of breath. 75 mL 0   Budeson-Glycopyrrol-Formoterol (BREZTRI AEROSPHERE) 160-9-4.8 MCG/ACT AERO Inhale 2 puffs into the lungs in the morning and at bedtime. 10.7 g 11   citalopram (CELEXA) 10 MG tablet Take 1 tablet (10 mg total) by mouth at bedtime. 90 tablet 3   ezetimibe (ZETIA) 10 MG tablet Take 1 tablet (10 mg total) by mouth daily. 90 tablet 3   gabapentin (NEURONTIN) 100 MG capsule TAKE 2 CAPSULES BY MOUTH 3  TIMES DAILY 540 capsule 3   hydrochlorothiazide (HYDRODIURIL) 25 MG tablet Take 1 tablet (25 mg total) by mouth daily. 90 tablet 3   losartan (COZAAR) 100 MG tablet TAKE 1 TABLET(100 MG) BY MOUTH DAILY 90 tablet 1   mirtazapine (REMERON) 30 MG tablet Take 1 tablet (30 mg total) by mouth at bedtime. 90 tablet 3   OXYGEN Inhale into the lungs daily. Uses at night with CPAP and PRN during daytime     pantoprazole (PROTONIX) 40 MG tablet TAKE 1 TABLET BY MOUTH TWICE  DAILY 180 tablet 3   traZODone (DESYREL) 150 MG tablet TAKE 1 TABLET BY MOUTH AT  BEDTIME 90 tablet 3   No facility-administered medications prior to visit.   Review of Systems  Review of Systems  Constitutional: Negative.   HENT: Negative.    Respiratory: Negative.         DOE    Physical Exam  BP 130/62 (BP Location: Left Arm, Cuff Size: Normal)   Pulse 76   Temp 97.7 F (36.5 C) (Temporal)   Ht '5\' 5"'$  (1.651 m)   Wt 146 lb (66.2 kg)   SpO2 94%   BMI 24.30 kg/m  Physical  Exam Constitutional:      Appearance: Normal appearance.  HENT:     Head: Normocephalic and atraumatic.  Cardiovascular:     Rate and Rhythm: Normal rate and regular rhythm.  Pulmonary:     Effort: Pulmonary effort is normal.     Breath sounds: Normal breath sounds. No wheezing, rhonchi or rales.     Comments: 02 94% RA Neurological:     General: No focal deficit present.     Mental Status: She is alert and oriented to person, place, and time. Mental status is at baseline.  Psychiatric:        Mood and Affect: Mood normal.        Behavior: Behavior normal.        Thought Content: Thought content normal.        Judgment:  Judgment normal.      Lab Results:  CBC    Component Value Date/Time   WBC 7.9 10/19/2021 0858   WBC 7.6 11/11/2020 1331   RBC 4.97 10/19/2021 0858   RBC 5.02 11/11/2020 1331   HGB 14.8 10/19/2021 0858   HCT 44.5 10/19/2021 0858   PLT 229 10/19/2021 0858   MCV 90 10/19/2021 0858   MCV 87 12/24/2012 1108   MCH 29.8 10/19/2021 0858   MCH 31.1 11/11/2020 1331   MCHC 33.3 10/19/2021 0858   MCHC 32.9 11/11/2020 1331   RDW 13.4 10/19/2021 0858   RDW 14.7 (H) 12/24/2012 1108   LYMPHSABS 1.5 10/19/2021 0858   LYMPHSABS 1.4 12/24/2012 1108   MONOABS 1.4 (H) 05/26/2016 1136   MONOABS 0.5 12/24/2012 1108   EOSABS 0.2 10/19/2021 0858   EOSABS 0.1 12/24/2012 1108   BASOSABS 0.1 10/19/2021 0858   BASOSABS 0.0 12/24/2012 1108    BMET    Component Value Date/Time   NA 144 10/19/2021 0858   NA 141 12/24/2012 1108   K 4.0 10/19/2021 0858   K 3.9 12/24/2012 1108   CL 104 10/19/2021 0858   CL 101 12/24/2012 1108   CO2 25 10/19/2021 0858   CO2 29 12/24/2012 1108   GLUCOSE 100 (H) 10/19/2021 0858   GLUCOSE 98 11/11/2020 1331   GLUCOSE 112 (H) 12/24/2012 1108   BUN 16 10/19/2021 0858   BUN 19 (H) 12/24/2012 1108   CREATININE 1.69 (H) 10/19/2021 0858   CREATININE 1.36 (H) 12/24/2012 1108   CALCIUM 9.0 10/19/2021 0858   CALCIUM 9.6 12/24/2012 1108    GFRNONAA 28 (L) 11/11/2020 1331   GFRNONAA 39 (L) 12/24/2012 1108   GFRAA 31 (L) 08/11/2020 1046   GFRAA 46 (L) 12/24/2012 1108    BNP No results found for: "BNP"  ProBNP No results found for: "PROBNP"  Imaging: No results found.   Assessment & Plan:   Apnea, sleep - Sleep study from Step 2016 with [facility] showed MILD OSA, AHI 14.3/hr.  - She is compliant with CPAP use. Average 303/30 days. Pressure 8cm h20 with residual AHI 0.0. No changes at this time needed. Continue to encourage patient were CPAP every night.   COPD (chronic obstructive pulmonary disease) (HCC) - Stable; PFT in May 2022 showed severe obstructive airway disease without BS response. Changed form [place] to [place] in May 2022 and doing well. No recent exacerbations. Still smoking, not ready to quit at this time.  Due for repeat low-dose CT chest in September 2023 to follow-up on lung nodule. Continue to work on smoking cessation efforts, taper amount you smoke as much as possible. No changed today. FU in 6 months with Dr. Patsey White.    Martyn Ehrich, NP 06/20/2022

## 2022-06-15 NOTE — Patient Instructions (Addendum)
Recommendations - Continue Breztri Aerosphere 2 puffs every morning and evening (rinse mouth after use) - Continue oxygen at night 3L  - Please bring SD card to adapt so that they can pull a compliance report for [place] to review. Once we have received this we can place an order for you to receive new CPAP machine - Due for repeat low-dose CT chest in September 2023 to follow-up on lung nodule - Continue to work on smoking cessation efforts, taper amount you smoke as much as possible - Discuss increasing Celexa with PCP  Follow-up: - 6 months with Dr. Patsey Berthold or sooner if needed

## 2022-06-16 ENCOUNTER — Telehealth: Payer: Self-pay

## 2022-06-16 ENCOUNTER — Ambulatory Visit: Payer: Medicare Other | Admitting: Dermatology

## 2022-06-16 ENCOUNTER — Encounter: Payer: Self-pay | Admitting: Primary Care

## 2022-06-16 DIAGNOSIS — D171 Benign lipomatous neoplasm of skin and subcutaneous tissue of trunk: Secondary | ICD-10-CM

## 2022-06-16 DIAGNOSIS — L820 Inflamed seborrheic keratosis: Secondary | ICD-10-CM

## 2022-06-16 DIAGNOSIS — L570 Actinic keratosis: Secondary | ICD-10-CM

## 2022-06-16 DIAGNOSIS — D229 Melanocytic nevi, unspecified: Secondary | ICD-10-CM

## 2022-06-16 DIAGNOSIS — L853 Xerosis cutis: Secondary | ICD-10-CM

## 2022-06-16 DIAGNOSIS — D1800 Hemangioma unspecified site: Secondary | ICD-10-CM

## 2022-06-16 DIAGNOSIS — R238 Other skin changes: Secondary | ICD-10-CM | POA: Diagnosis not present

## 2022-06-16 DIAGNOSIS — L578 Other skin changes due to chronic exposure to nonionizing radiation: Secondary | ICD-10-CM

## 2022-06-16 DIAGNOSIS — L720 Epidermal cyst: Secondary | ICD-10-CM | POA: Diagnosis not present

## 2022-06-16 DIAGNOSIS — L821 Other seborrheic keratosis: Secondary | ICD-10-CM

## 2022-06-16 DIAGNOSIS — Z1283 Encounter for screening for malignant neoplasm of skin: Secondary | ICD-10-CM

## 2022-06-16 DIAGNOSIS — L814 Other melanin hyperpigmentation: Secondary | ICD-10-CM

## 2022-06-16 MED ORDER — TRETINOIN 0.025 % EX CREA: TOPICAL_CREAM | Freq: Every day | CUTANEOUS | 6 refills | Status: AC

## 2022-06-16 NOTE — Patient Instructions (Addendum)
Start over the counter Amlactin rapid relief daily for dry skin    Cryotherapy Aftercare  Wash gently with soap and water everyday.   Apply Vaseline and Band-Aid daily until healed.     Due to recent changes in healthcare laws, you may see results of your pathology and/or laboratory studies on MyChart before the doctors have had a chance to review them. We understand that in some cases there may be results that are confusing or concerning to you. Please understand that not all results are received at the same time and often the doctors may need to interpret multiple results in order to provide you with the best plan of care or course of treatment. Therefore, we ask that you please give [place] 2 business days to thoroughly review all your results before contacting the office for clarification. Should we see a critical lab result, you will be contacted sooner.   If You Need Anything After Your Visit  If you have any questions or concerns for your doctor, please call our main line at [phone] and press option 4 to reach your doctor's medical assistant. If no one answers, please leave a voicemail as directed and we will return your call as soon as possible. Messages left after 4 pm will be answered the following business day.   You may also send [place] a message via [place]. We typically respond to MyChart messages within 1-2 business days.  For prescription refills, please ask your pharmacy to contact our office. Our fax number is [phone].  If you have an urgent issue when the clinic is closed that cannot wait until the next business day, you can page your doctor at the number below.    Please note that while we do our best to be available for urgent issues outside of office hours, we are not available 24/7.   If you have an urgent issue and are unable to reach [place], you may choose to seek medical care at your doctor's office, retail clinic, urgent care center, or emergency room.  If you have  a medical emergency, please immediately call 911 or go to the emergency department.  Pager Numbers  - Dr. Nehemiah Massed: [phone]  - Dr. Laurence Ferrari: [phone]  - Dr. Nicole Kindred: [phone]  In the event of inclement weather, please call our main line at [phone] for an update on the status of any delays or closures.  Dermatology Medication Tips: Please keep the boxes that topical medications come in in order to help keep track of the instructions about where and how to use these. Pharmacies typically print the medication instructions only on the boxes and not directly on the medication tubes.   If your medication is too expensive, please contact our office at [phone] option 4 or send [place] a message through [place].   We are unable to tell what your co-pay for medications will be in advance as this is different depending on your insurance coverage. However, we may be able to find a substitute medication at lower cost or fill out paperwork to get insurance to cover a needed medication.   If a prior authorization is required to get your medication covered by your insurance company, please allow [place] 1-2 business days to complete this process.  Drug prices often vary depending on where the prescription is filled and some pharmacies may offer cheaper prices.  The website www.goodrx.com contains coupons for medications through different pharmacies. The prices here do not account for what the cost may be with  help from insurance (it may be cheaper with your insurance), but the website can give you the price if you did not use any insurance.  - You can print the associated coupon and take it with your prescription to the pharmacy.  - You may also stop by our office during regular business hours and pick up a GoodRx coupon card.  - If you need your prescription sent electronically to a different pharmacy, notify our office through [facility] or by phone at [phone] option 4.     Si  Usted Necesita Algo Despus de Su Visita  Tambin puede enviarnos un mensaje a travs de Pharmacist, community. Por lo general respondemos a los mensajes de MyChart en el transcurso de 1 a 2 das hbiles.  Para renovar recetas, por favor pida a su farmacia que se ponga en contacto con nuestra oficina. Harland Dingwall de fax es [place] [phone].  Si tiene un asunto urgente cuando la clnica est cerrada y que no puede esperar hasta el siguiente da hbil, puede llamar/localizar a su doctor(a) al nmero que aparece a continuacin.   Por favor, tenga en cuenta que aunque hacemos todo lo posible para estar disponibles para asuntos urgentes fuera del horario de [place], no estamos disponibles las 24 horas del da, los 7 das de la [place].   Si tiene un problema urgente y no puede comunicarse con nosotros, puede optar por buscar atencin mdica  en el consultorio de su doctor(a), en una clnica privada, en un centro de atencin urgente o en una sala de emergencias.  Si tiene Engineering geologist, por favor llame inmediatamente al 911 o vaya a la sala de emergencias.  Nmeros de bper  - Dr. Nehemiah Massed: [phone]  - Dra. Moye: [phone]  - Dra. Nicole Kindred: [phone]  En caso de inclemencias del [place], por favor llame a Johnsie Kindred principal al [phone] para una actualizacin sobre el [place] de cualquier retraso o cierre.  Consejos para la medicacin en dermatologa: Por favor, guarde las cajas en las que vienen los medicamentos de uso tpico para ayudarle a seguir las instrucciones sobre dnde y cmo usarlos. Las farmacias generalmente imprimen las instrucciones del medicamento slo en las cajas y no directamente en los tubos del [place].   Si su medicamento es muy caro, por favor, pngase en contacto con Zigmund Daniel llamando al [phone] y presione la opcin 4 o envenos un mensaje a travs de Pharmacist, community.   No podemos decirle cul ser su copago por los medicamentos por adelantado ya que  esto es diferente dependiendo de la cobertura de su seguro. Sin embargo, es posible que podamos encontrar un medicamento sustituto a Electrical engineer un formulario para que el seguro cubra el medicamento que se considera necesario.   Si se requiere una autorizacin previa para que su compaa de seguros [place] su medicamento, por favor permtanos de 1 a 2 das hbiles para completar este proceso.  Los precios de los medicamentos varan con frecuencia dependiendo del Environmental consultant de dnde se surte la receta y alguna farmacias pueden ofrecer precios ms baratos.  El sitio web www.goodrx.com tiene cupones para medicamentos de Airline pilot. Los precios aqu no tienen en cuenta lo que podra costar con la ayuda del seguro (puede ser ms barato con su seguro), pero el sitio web puede darle el precio si no utiliz Research scientist (physical sciences).  - Puede imprimir el cupn correspondiente y llevarlo con su receta a la farmacia.  - Tambin puede pasar por nuestra oficina durante el  horario de Freight forwarder regular y Charity fundraiser una tarjeta de cupones de GoodRx.  - Si necesita que su receta se enve electrnicamente a una farmacia diferente, informe a nuestra oficina a travs de MyChart de [place] o por telfono llamando al [phone] y presione la opcin 4.

## 2022-06-16 NOTE — Progress Notes (Signed)
Follow-Up Visit   Subjective  Crystal White is a 81 y.o. female who presents for the following: Annual Exam (Mole check ). The patient presents for Total-Body Skin Exam (TBSE) for skin cancer screening and mole check.  The patient has spots, moles and lesions to be evaluated, some may be new or changing and the patient has concerns that these could be cancer.   The following portions of the chart were reviewed this encounter and updated as appropriate:   Tobacco  Allergies  Meds  Problems  Med Hx  Surg Hx  Fam Hx     Review of Systems:  No other skin or systemic complaints except as noted in HPI or Assessment and Plan.  Objective  Well appearing patient in no apparent distress; mood and affect are within normal limits.  A full examination was performed including scalp, head, eyes, ears, nose, lips, neck, chest, axillae, abdomen, back, buttocks, bilateral upper extremities, bilateral lower extremities, hands, feet, fingers, toes, fingernails, and toenails. All findings within normal limits unless otherwise noted below.  bilateral ears Bluish macule   face Smooth white papule(s).   Right Upper Back 1 cm Subcutaneous nodule.   right mid back 10 cm   Left Upper Back Stuck-on, waxy, tan-brown papules and plaques -- Discussed benign etiology and prognosis.   right upper lip Erythematous thin papules/macules with gritty scale.    Assessment & Plan  [place] bilateral ears Benign-appearing.  Observation.  Call clinic for new or changing moles.  Recommend daily use of broad spectrum spf 30+ sunscreen to sun-exposed areas.    [place] face Extraction vs topical retinoid cream discussed. Rx for tretinoin 0.025% cream nightly to affected area spot treat  Epidermal inclusion cyst Right Upper Back Benign-appearing. Exam most consistent with an epidermal inclusion cyst. Discussed that a cyst is a benign growth that can grow over time and sometimes get irritated or  inflamed. Recommend observation if it is not bothersome. Discussed option of surgical excision to remove it if it is growing, symptomatic, or other changes noted. Please call for new or changing lesions so they can be evaluated.   Lipoma of torso right mid back Benign-appearing. Exam most consistent with an Lipoma. Discussed that a lipoma is a benign growth that can grow over time and sometimes get irritated or inflamed. Recommend observation if it is not bothersome. Discussed option of surgical excision to remove it if it is growing, symptomatic, or other changes noted. Please call for new or changing lesions so they can be evaluated.   Inflamed seborrheic keratosis Left Upper Back Symptomatic, irritating, patient would like treated.  Destruction of lesion - Left Upper Back Complexity: simple   Destruction method: cryotherapy   Informed consent: discussed and consent obtained   Timeout:  patient name, date of birth, surgical site, and procedure verified Lesion destroyed using liquid nitrogen: Yes   Region frozen until ice ball extended beyond lesion: Yes   Outcome: patient tolerated procedure well with no complications   Post-procedure details: wound care instructions given    AK (actinic keratosis) right upper lip Actinic keratoses are precancerous spots that appear secondary to cumulative UV radiation exposure/sun exposure over time. They are chronic with expected duration over 1 year. A portion of actinic keratoses will progress to squamous cell carcinoma of the skin. It is not possible to reliably predict which spots will progress to skin cancer and so treatment is recommended to prevent development of skin cancer.  Recommend daily broad spectrum  sunscreen SPF 30+ to sun-exposed areas, reapply every 2 hours as needed.  Recommend staying in the shade or wearing long sleeves, sun glasses (UVA+UVB protection) and wide brim hats (4-inch brim around the entire circumference of the hat). Call  for new or changing lesions.   Destruction of lesion - right upper lip Complexity: simple   Destruction method: cryotherapy   Informed consent: discussed and consent obtained   Timeout:  patient name, date of birth, surgical site, and procedure verified Lesion destroyed using liquid nitrogen: Yes   Region frozen until ice ball extended beyond lesion: Yes   Outcome: patient tolerated procedure well with no complications   Post-procedure details: wound care instructions given    Lentigines - Scattered tan macules - Due to sun exposure - Benign-appearing, observe - Recommend daily broad spectrum sunscreen SPF 30+ to sun-exposed areas, reapply every 2 hours as needed. - Call for any changes  Seborrheic Keratoses - Stuck-on, waxy, tan-brown papules and/or plaques  - Benign-appearing - Discussed benign etiology and prognosis. - Observe - Call for any changes  Melanocytic Nevi - Tan-brown and/or pink-flesh-colored symmetric macules and papules - Benign appearing on exam today - Observation - Call clinic for new or changing moles - Recommend daily use of broad spectrum spf 30+ sunscreen to sun-exposed areas.   Hemangiomas - Red papules - Discussed benign nature - Observe - Call for any changes  Actinic Damage - Chronic condition, secondary to cumulative UV/sun exposure - diffuse scaly erythematous macules with underlying dyspigmentation - Recommend daily broad spectrum sunscreen SPF 30+ to sun-exposed areas, reapply every 2 hours as needed.  - Staying in the shade or wearing long sleeves, sun glasses (UVA+UVB protection) and wide brim hats (4-inch brim around the entire circumference of the hat) are also recommended for sun protection.  - Call for new or changing lesions.  Xerosis - diffuse xerotic patches - recommend gentle, hydrating skin care - gentle skin care handout given  Recommend Amlactin rapid relief  Skin cancer screening performed today.   Return in about 1  year (around 06/17/2023) for TBSE .  IMarye Round, CMA, am acting as scribe for Sarina Ser, MD .  Documentation: I have reviewed the above documentation for accuracy and completeness, and I agree with the above.  Sarina Ser, MD

## 2022-06-16 NOTE — Telephone Encounter (Signed)
Download has been received from [place] and faxed to [facility] for [place] to review.

## 2022-06-17 NOTE — Telephone Encounter (Signed)
ATC patient--unable to leave vm due to mailbox being full  Received message from [place]. Patient does not qualify for new machine, as she just got a new machine in 2021.  Routing to [employer] as an Pharmacist, hospital.

## 2022-06-17 NOTE — Telephone Encounter (Signed)
Oh ok, then no changes. Thanks

## 2022-06-17 NOTE — Telephone Encounter (Signed)
Received Icode connect compliance report.  Report date 06/16/2022.  Average usage 30/30 days (100%).  Average daily usage 9 hours 54 minutes.  Average pressure 9 cm H2O (95%).  Average AHI 0.  High leak time 0%.  Can we please place an order for new CPAP at 9 cm H2O. DME company is [employer]

## 2022-06-20 NOTE — Telephone Encounter (Signed)
ATC patient x2--unable to leave vm due to mailbox being full. Will close encounter per office protocol.  

## 2022-06-20 NOTE — Progress Notes (Signed)
Agree with the details of the visit as noted by Elizabeth Walsh, NP.  C. Laura Jennalynn Rivard, MD [facility] 

## 2022-06-20 NOTE — Assessment & Plan Note (Signed)
-   Sleep study from Step 2016 with [facility] showed MILD OSA, AHI 14.3/hr.  - She is compliant with CPAP use. Average 303/30 days. Pressure 8cm h20 with residual AHI 0.0. No changes at this time needed. Continue to encourage patient were CPAP every night.

## 2022-06-20 NOTE — Assessment & Plan Note (Addendum)
-   Stable; PFT in May 2022 showed severe obstructive airway disease without BS response. Changed form [place] to [place] in May 2022 and doing well. No recent exacerbations. Still smoking, not ready to quit at this time.  Due for repeat low-dose CT chest in September 2023 to follow-up on lung nodule. Continue to work on smoking cessation efforts, taper amount you smoke as much as possible. No changed today. FU in 6 months with Dr. Patsey Berthold.

## 2022-06-24 ENCOUNTER — Ambulatory Visit (INDEPENDENT_AMBULATORY_CARE_PROVIDER_SITE_OTHER): Payer: Medicare Other

## 2022-06-24 DIAGNOSIS — J449 Chronic obstructive pulmonary disease, unspecified: Secondary | ICD-10-CM

## 2022-06-24 DIAGNOSIS — I10 Essential (primary) hypertension: Secondary | ICD-10-CM

## 2022-06-24 DIAGNOSIS — Z9181 History of falling: Secondary | ICD-10-CM

## 2022-06-25 ENCOUNTER — Encounter: Payer: Self-pay | Admitting: Dermatology

## 2022-06-26 NOTE — Chronic Care Management (AMB) (Signed)
Chronic Care Management   CCM RN Visit Note   Name: Crystal White MRN: [id number] DOB: 1941/09/14  Subjective: Crystal White is a 81 y.o. year old female who is a primary care patient of Jerrol Banana., MD. The care management team was consulted for assistance with disease management and care coordination needs.    Engaged with patient by telephone for follow up visit in response to provider referral for case management and care coordination services.   Consent to Services:  The patient was given information about Chronic Care Management services, agreed to services, and gave verbal consent prior to initiation of services.  Please see initial visit note for detailed documentation.   Assessment: Review of patient past medical history, allergies, medications, health status, including review of consultants reports, laboratory and other test data, was performed as part of comprehensive evaluation and provision of chronic care management services.   SDOH (Social Determinants of Health) assessments and interventions performed: No  CCM Care Plan  Allergies  Allergen Reactions   Iodinated Contrast Media Other (See Comments)    Burning sensation during contrast media injections. Patient states the IV contrast makes her very hot. She has had multiple CT Scans with IV contrast and states she only gets the warm sensation. This is not an allergic reaction, but is noted in her [place] chart as well. Aggie Hacker, ARRT R CT, had this discussion with her on 01/10/19 at 1130am.   Bacitracin Swelling and Rash   Statins Other (See Comments) and Rash    Reaction:  Leg cramps  Reaction:  Leg cramps     Outpatient Encounter Medications as of 06/24/2022  Medication Sig   acetaminophen (TYLENOL) 500 MG tablet Take 1,000 mg by mouth every 6 (six) hours as needed (pain/headaches.).    albuterol (PROVENTIL HFA) 108 (90 Base) MCG/ACT inhaler Inhale 2 puffs into the lungs every 6 (six) hours as  needed for wheezing or shortness of breath.   albuterol (PROVENTIL) (2.5 MG/3ML) 0.083% nebulizer solution Take 3 mLs (2.5 mg total) by nebulization every 4 (four) hours as needed for wheezing or shortness of breath.   Budeson-Glycopyrrol-Formoterol (BREZTRI AEROSPHERE) 160-9-4.8 MCG/ACT AERO Inhale 2 puffs into the lungs in the morning and at bedtime.   citalopram (CELEXA) 10 MG tablet Take 1 tablet (10 mg total) by mouth at bedtime.   ezetimibe (ZETIA) 10 MG tablet Take 1 tablet (10 mg total) by mouth daily.   gabapentin (NEURONTIN) 100 MG capsule TAKE 2 CAPSULES BY MOUTH 3  TIMES DAILY   hydrochlorothiazide (HYDRODIURIL) 25 MG tablet Take 1 tablet (25 mg total) by mouth daily.   losartan (COZAAR) 100 MG tablet TAKE 1 TABLET(100 MG) BY MOUTH DAILY   mirtazapine (REMERON) 30 MG tablet Take 1 tablet (30 mg total) by mouth at bedtime.   OXYGEN Inhale into the lungs daily. Uses at night with CPAP and PRN during daytime   pantoprazole (PROTONIX) 40 MG tablet TAKE 1 TABLET BY MOUTH TWICE  DAILY   traZODone (DESYREL) 150 MG tablet TAKE 1 TABLET BY MOUTH AT  BEDTIME   tretinoin (RETIN-A) 0.025 % cream Apply topically at bedtime.   No facility-administered encounter medications on file as of 06/24/2022.    Patient Active Problem List   Diagnosis Date Noted   Wears partial dentures 07/17/2019   Incisional hernia, without obstruction or gangrene 06/03/2016   Osteoporosis 02/22/2016   Musculoskeletal chest pain 07/14/2015   Chronic respiratory failure ([place]) 07/14/2015   Acute bronchitis 07/14/2015  Other emphysema ([place]) 07/14/2015   Chronic renal insufficiency 07/14/2015   Chest pain 07/13/2015   Multiple pulmonary nodules 06/02/2015   Colon polyp 05/29/2015   Hemoptysis 04/29/2015   Sprain of ankle 04/14/2015   Anxiety 04/14/2015   Barrett's esophagus 04/14/2015   Acute exacerbation of chronic obstructive airways disease ([place]) 04/14/2015   Bursitis 04/14/2015   Cellulitis 04/14/2015    Claudication ([place]) 04/14/2015   Cough 04/14/2015   Deficiency, 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   Clinical depression 04/14/2015   Dizziness 04/14/2015   Dyslipidemia 04/14/2015   Essential (primary) hypertension 04/14/2015   Flank pain 04/14/2015   H/O suicide attempt 04/14/2015   Dysphonia 04/14/2015   Hypercholesteremia 04/14/2015   Cannot sleep 04/14/2015   Malaise and fatigue 04/14/2015   Mild cognitive impairment 04/14/2015   Cramp in muscle 04/14/2015   Muscle ache 04/14/2015   Neuropathy 04/14/2015   Adiposity 04/14/2015   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   Candida infection of mouth 04/14/2015   Abnormal loss of weight 04/14/2015   Decreased body weight 04/14/2015   Artificial cardiac pacemaker 10/08/2014   Apnea, sleep 10/08/2014   Presence of cardiac pacemaker 10/08/2014   Hyperlipidemia 10/08/2014   COPD (chronic obstructive pulmonary disease) ([place]) 02/13/2012   Tobacco abuse 02/13/2012   Sinus congestion 02/13/2012   Current tobacco use 02/13/2012   Nasal congestion 02/13/2012   Tobacco use disorder 02/13/2012   Esophagitis, reflux 08/28/2006   Abdominal pain, right lower quadrant 08/22/2005   Acute appendicitis with peritoneal abscess 08/22/2005    Patient Care Plan: RN Care Managemement Plan of Care     Problem Identified: HTN, HLD, COPD      Long-Range Goal: Disease Progression Prevented or Minimized   Start Date: 04/15/2022  Expected End Date: 07/14/2022  Priority: High  Note:   Current Barriers:  Chronic Disease Management support and education needs related to HTN, HLD, and COPD   RNCM Clinical Goal(s):  Patient will demonstrate ongoing adherence to prescribed treatment plan for HTN, HLD, and COPD through collaboration with the provider, RN Care Manager and the care team.   Interventions: 1:1 collaboration with primary care provider regarding development and update of comprehensive plan of care as evidenced by provider attestation and  co-signature Inter-disciplinary care team collaboration (see longitudinal plan of care) Evaluation of current treatment plan related to  self management and patient's adherence to plan as established by provider   COPD Interventions:   Reviewed plan for COPD management. Continues to experience shortness of breath with minimal exertion. Reports that she fatigues easily but overall feels symptoms are well managed with her current medications. Reports avoiding outdoor activity during the heat of the day to reduce risks of exacerbation. Continues using CPAP with supplemental oxygen consistently as recommended.  Reviewed importance of daily self-assessment. Verbalized awareness of need to contact the clinic of the Pulmonology team if experiencing moderate symptoms for greater than 48 hours without improvement.  Reviewed worsening symptoms that require immediate medical attention.   Hyperlipidemia Interventions:  (No changes) Reviewed plan for hyperlipidemia management. Reports taking medication as prescribed. Reviewed cholesterol goals. Last labs (11/22) were elevated. Advised to complete follow up labs when ordered. Reviewed importance of limiting foods high in cholesterol. Advised to continue reading nutrition labels and avoid highly processed foods when possible. Discussed activity goals. Activity is limited d/t COPD. Reports still being able to engage in short walks and takes breaks as needed. Advised to continue utilizing upright walker and engaging in low impact walks as tolerated.  Hypertension  Interventions:   Reviewed plan for hypertension management. Reports adherence to plan. Taking medications as prescribed. Reviewed blood pressure readings.  Reports systolic readings have ranged from the 110's to 160's. Reports experiencing episodes of dizziness when systolic readings are in the 110's and 120's. Noted that diastolic readings tend to be in the low 74'B when systolic readings are within  range. Reports the dizziness has been controlled with meclizine. She plans to discuss options for medication adjustment during her next visit with the Cardiology team.  Discussed symptoms. Reports one episode of pain that radiated to her chest after pulling a muscle last week. Reports symptoms resolved. Denies episodes of episodes of palpitations, headaches, or visual changes.  Thorough review of symptoms and indications for seeking immediate medical attention.   Fall Risk Interventions Reviewed medications and potential side effects that may increase risks of falls. Reviewed safety and fall prevention measures. Reports experiencing a fall a few weeks ago while walking out of her room. Unable to recall what caused her to lose her balance and fall. Reports scraping her arm and hitting the side of her head on the doorframe. Reports paramedics were not notified, and she did not require medical follow-up. Notes her grandson was home at the time and was able to assist. Offered clinical appointment for evaluation and possible imaging d/t hitting the side of her head. She has also experienced back soreness and muscle strain since the incident. Patient declined. Agreed to contact the clinic or seek care if she experiences another fall. Reports using recommended precautions in the home. Reports she has her cane for in home use and uses an upright walker when if leaving the home.  Reports her grandson lives in the home and available to assist when needed. We discussed increased risk due to recent episodes of dizziness. Reports symptoms are currently well controlled with meclizine. Agreed to contact the clinic if episodes continue and meclizine is not effective with symptom control. Thorough discussion regarding indications for medical follow up.    Patient Goals/Self-Care Activities: Take all medications as prescribed Attend all scheduled provider appointments Call pharmacy for medication refills 3-7 days in  advance of running out of medications Perform all self care activities independently Call provider office for new concerns or questions       PLAN: Ms. Newby agreed to call if she requires additional nursing outreach. The care team will gladly assist.   [facility] Care Management [phone]

## 2022-06-27 DIAGNOSIS — J449 Chronic obstructive pulmonary disease, unspecified: Secondary | ICD-10-CM

## 2022-06-27 DIAGNOSIS — I10 Essential (primary) hypertension: Secondary | ICD-10-CM | POA: Diagnosis not present

## 2022-06-30 ENCOUNTER — Ambulatory Visit: Payer: Medicare Other | Admitting: Family Medicine

## 2022-07-06 ENCOUNTER — Ambulatory Visit: Payer: Self-pay

## 2022-07-06 NOTE — Chronic Care Management (AMB) (Signed)
   07/06/2022  KALLIE DEPOLO 1941/08/06 [id number]  Documentation encounter created to complete case transition. The care management team will continue to follow for care coordination.  [facility] [phone]

## 2022-08-05 ENCOUNTER — Other Ambulatory Visit: Payer: Self-pay | Admitting: Family Medicine

## 2022-08-09 ENCOUNTER — Telehealth: Payer: Medicare Other

## 2022-08-09 ENCOUNTER — Other Ambulatory Visit: Payer: Self-pay | Admitting: Family Medicine

## 2022-08-09 DIAGNOSIS — I10 Essential (primary) hypertension: Secondary | ICD-10-CM

## 2022-08-15 ENCOUNTER — Ambulatory Visit (INDEPENDENT_AMBULATORY_CARE_PROVIDER_SITE_OTHER): Payer: Medicare Other

## 2022-08-15 DIAGNOSIS — Z720 Tobacco use: Secondary | ICD-10-CM

## 2022-08-15 DIAGNOSIS — J449 Chronic obstructive pulmonary disease, unspecified: Secondary | ICD-10-CM

## 2022-08-15 NOTE — Progress Notes (Cosign Needed Addendum)
Chronic Care Management Pharmacy Note  08/26/2022 Name:  Crystal White MRN:  [id number] DOB:  [birth year]  Summary: Patient presents for CCM follow-up.  -Patient is in a pre-contemplative stage of smoking cessation  Recommendations/Changes made from today's visit: Continue current medications   Plan: CPP follow-up in 3 months  Subjective: Crystal White is an 81 y.o. year old female who is a primary patient of Jerrol Banana., MD.  The CCM team was consulted for assistance with disease management and care coordination needs.    Engaged with patient by telephone for follow up visit in response to provider referral for pharmacy case management and/or care coordination services.   Consent to Services:  The patient was given information about Chronic Care Management services, agreed to services, and gave verbal consent prior to initiation of services.  Please see initial visit note for detailed documentation.   Patient Care Team: Jerrol Banana., MD as PCP - General (Unknown Physician Specialty) Samara Deist, DPM as Referring Physician (Podiatry) Isaias Cowman, MD as Consulting Physician (Cardiology) Germaine Pomfret, [facility] (Pharmacist) Tyler Pita, MD as Consulting Physician (Pulmonary Disease)  Recent office visits: 10/12/21: Patient presented to Dr. Rosanna Randy for follow-up.   Recent consult visits: None in past 6 months.   Hospital visits: None in past 6 months.    Objective:  Lab Results  Component Value Date   CREATININE 1.69 (H) 10/19/2021   BUN 16 10/19/2021   GFRNONAA 28 (L) 11/11/2020   GFRAA 31 (L) 08/11/2020   NA 144 10/19/2021   K 4.0 10/19/2021   CALCIUM 9.0 10/19/2021   CO2 25 10/19/2021   GLUCOSE 100 (H) 10/19/2021    Lab Results  Component Value Date/Time   HGBA1C 5.5 01/24/2017 11:32 AM    Last diabetic Eye exam: No results found for: "HMDIABEYEEXA"  Last diabetic Foot exam: No results found for: "HMDIABFOOTEX"    Lab Results  Component Value Date   CHOL 216 (H) 10/19/2021   HDL 60 10/19/2021   LDLCALC 126 (H) 10/19/2021   TRIG 169 (H) 10/19/2021   CHOLHDL 3.6 10/19/2021       Latest Ref Rng & Units 10/19/2021    8:58 AM 08/11/2020   10:46 AM 02/13/2020    4:42 PM  Hepatic Function  Total Protein 6.0 - 8.5 g/dL 6.4  6.9  6.6   Albumin 3.7 - 4.7 g/dL 3.9  4.3  4.1   AST 0 - 40 IU/L 12  9  13    ALT 0 - 32 IU/L 7  6  7    Alk Phosphatase 44 - 121 IU/L 86  89  95   Total Bilirubin 0.0 - 1.2 mg/dL 0.4  0.3  0.3     Lab Results  Component Value Date/Time   TSH 1.670 10/19/2021 08:58 AM   TSH 1.160 08/11/2020 10:46 AM       Latest Ref Rng & Units 10/19/2021    8:58 AM 11/11/2020    1:31 PM 08/11/2020   10:46 AM  CBC  WBC 3.4 - 10.8 x10E3/uL 7.9  7.6  9.4   Hemoglobin 11.1 - 15.9 g/dL 14.8  15.6  15.3   Hematocrit 34.0 - 46.6 % 44.5  47.4  46.5   Platelets 150 - 450 x10E3/uL 229  188  197     No results found for: "VD25OH"  Clinical ASCVD: No  The 10-year ASCVD risk score (Arnett DK, et al., 2019) is: 40.8%   Values used  to calculate the score:     Age: 45 years     Sex: Female     Is Non-Hispanic African American: No     Diabetic: No     Tobacco smoker: Yes     Systolic Blood Pressure: 419 mmHg     Is BP treated: Yes     HDL Cholesterol: 60 mg/dL     Total Cholesterol: 216 mg/dL       01/06/2022    2:20 PM 01/12/2021   11:14 AM 10/29/2020    8:59 AM  Depression screen PHQ 2/9  Decreased Interest 0 0 0  Down, Depressed, Hopeless 0 0 0  PHQ - 2 Score 0 0 0    Social History   Tobacco Use  Smoking Status Every Day   Packs/day: 1.00   Years: 55.00   Total pack years: 55.00   Types: Cigarettes  Smokeless Tobacco Never  Tobacco Comments   15 cigarettes daily-06/15/2022   BP Readings from Last 3 Encounters:  06/15/22 130/62  10/12/21 140/70  09/06/21 (!) 162/96   Pulse Readings from Last 3 Encounters:  06/15/22 76  10/12/21 71  09/06/21 83   Wt Readings from  Last 3 Encounters:  06/15/22 146 lb (66.2 kg)  10/12/21 162 lb (73.5 kg)  04/02/21 152 lb 6.4 oz (69.1 kg)   BMI Readings from Last 3 Encounters:  06/15/22 24.30 kg/m  10/12/21 30.61 kg/m  04/02/21 28.61 kg/m    Assessment/Interventions: Review of patient past medical history, allergies, medications, health status, including review of consultants reports, laboratory and other test data, was performed as part of comprehensive evaluation and provision of chronic care management services.   SDOH:  (Social Determinants of Health) assessments and interventions performed: Yes SDOH Interventions    Flowsheet Row Clinical Support from 01/06/2022 in [place] [facility] from 12/13/2021 in [place] [facility] from 09/21/2021 in [place] [facility] from 06/21/2021 in [place] [facility] from 01/12/2021 in [place] from 04/28/2020 in [place] Interventions Intervention Not Indicated -- -- -- Intervention Not Indicated --  Housing Interventions Intervention Not Indicated -- -- -- -- --  Transportation Interventions Intervention Not Indicated -- -- -- Intervention Not Indicated --  Financial Strain Interventions Intervention Not Indicated Intervention Not Indicated Other (Comment)  [PAP] Other (Comment)  [PAP] -- --  Physical Activity Interventions Intervention Not Indicated -- -- -- -- Patient Refused  Stress Interventions Intervention Not Indicated -- -- -- Intervention Not Indicated --  Social Connections Interventions Intervention Not Indicated -- -- -- -- --        SDOH Screenings   Food Insecurity: No Food Insecurity (01/06/2022)  Housing: Low Risk  (01/06/2022)  Transportation Needs: No Transportation Needs (01/06/2022)  Alcohol Screen: Low Risk  (01/06/2022)  Depression (PHQ2-9): Low  Risk  (01/06/2022)  Financial Resource Strain: Low Risk  (01/06/2022)  Physical Activity: Insufficiently Active (01/06/2022)  Social Connections: Moderately Isolated (01/06/2022)  Stress: No Stress Concern Present (01/06/2022)  Tobacco Use: High Risk (06/25/2022)    CCM Care Plan  Allergies  Allergen Reactions   Iodinated Contrast Media Other (See Comments)    Burning sensation during contrast media injections. Patient states the IV contrast makes her very hot. She has had multiple CT Scans with IV contrast and states she only gets the warm sensation. This is not an  allergic reaction, but is noted in her [place] chart as well. Aggie Hacker, ARRT R CT, had this discussion with her on 01/10/19 at 1130am.   Bacitracin Swelling and Rash   Statins Other (See Comments) and Rash    Reaction:  Leg cramps  Reaction:  Leg cramps     Medications Reviewed Today     Reviewed by Ralene Bathe, MD (Physician) on 06/25/22 at 1453  Med List Status: <None>   Medication Order Taking? Sig Documenting Provider Last Dose Status Informant  acetaminophen (TYLENOL) 500 MG tablet [id number] No Take 1,000 mg by mouth every 6 (six) hours as needed (pain/headaches.).  [provider] Taking Active Self  albuterol (PROVENTIL HFA) 108 (90 Base) MCG/ACT inhaler [id number] No Inhale 2 puffs into the lungs every 6 (six) hours as needed for wheezing or shortness of breath. Jerrol Banana., MD Taking Active   albuterol (PROVENTIL) (2.5 MG/3ML) 0.083% nebulizer solution [id number] No Take 3 mLs (2.5 mg total) by nebulization every 4 (four) hours as needed for wheezing or shortness of breath. Jerrol Banana., MD Taking Active   Budeson-Glycopyrrol-Formoterol [facility] AEROSPHERE) 160-9-4.8 MCG/ACT Hollie Salk [id number] No Inhale 2 puffs into the lungs in the morning and at bedtime. Jerrol Banana., MD Taking Active   citalopram (CELEXA) 10 MG tablet [id number] No Take 1 tablet (10 mg total) by mouth at  bedtime. Jerrol Banana., MD Taking Active   ezetimibe (ZETIA) 10 MG tablet [id number] No Take 1 tablet (10 mg total) by mouth daily. Jerrol Banana., MD Taking Active   gabapentin (NEURONTIN) 100 MG capsule [id number] No TAKE 2 CAPSULES BY MOUTH 3  TIMES DAILY Jerrol Banana., MD Taking Active   hydrochlorothiazide (HYDRODIURIL) 25 MG tablet [id number] No Take 1 tablet (25 mg total) by mouth daily. Jerrol Banana., MD Taking Active   losartan (COZAAR) 100 MG tablet [id number] No TAKE 1 TABLET(100 MG) BY MOUTH DAILY Jerrol Banana., MD Taking Active   mirtazapine (REMERON) 30 MG tablet [id number] No Take 1 tablet (30 mg total) by mouth at bedtime. Jerrol Banana., MD Taking Active   OXYGEN [id number] No Inhale into the lungs daily. Uses at night with CPAP and PRN during daytime [provider] Taking Active Self  pantoprazole (PROTONIX) 40 MG tablet [id number] No TAKE 1 TABLET BY MOUTH TWICE  DAILY Jerrol Banana., MD Taking Active   traZODone (DESYREL) 150 MG tablet [id number] No TAKE 1 TABLET BY MOUTH AT  BEDTIME Jerrol Banana., MD Taking Active   tretinoin (RETIN-A) 0.025 % cream [id number] Yes Apply topically at bedtime. Ralene Bathe, MD  Active             Patient Active Problem List   Diagnosis Date Noted   Wears partial dentures 07/17/2019   Incisional hernia, without obstruction or gangrene 06/03/2016   Osteoporosis 02/22/2016   Musculoskeletal chest pain 07/14/2015   Chronic respiratory failure ([place]) 07/14/2015   Acute bronchitis 07/14/2015   Other emphysema ([place]) 07/14/2015   Chronic renal insufficiency 07/14/2015   Chest pain 07/13/2015   Multiple pulmonary nodules 06/02/2015   Colon polyp 05/29/2015   Hemoptysis 04/29/2015   Sprain of ankle 04/14/2015   Anxiety 04/14/2015   Barrett's esophagus 04/14/2015   Acute exacerbation of chronic obstructive airways disease ([place]) 04/14/2015   Bursitis 04/14/2015    Cellulitis 04/14/2015   Claudication ([place]) 04/14/2015   Cough 04/14/2015   Deficiency, 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   Clinical depression 04/14/2015   Dizziness 04/14/2015   Dyslipidemia 04/14/2015   Essential (primary) hypertension 04/14/2015   Flank pain 04/14/2015   H/O suicide attempt 04/14/2015   Dysphonia 04/14/2015   Hypercholesteremia 04/14/2015   Cannot sleep 04/14/2015   Malaise and fatigue 04/14/2015   Mild cognitive impairment 04/14/2015   Cramp in muscle 04/14/2015   Muscle ache 04/14/2015   Neuropathy 04/14/2015   Adiposity 04/14/2015   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   Candida infection of mouth 04/14/2015   Abnormal loss of weight 04/14/2015   Decreased body weight 04/14/2015   Artificial cardiac pacemaker 10/08/2014   Apnea, sleep 10/08/2014   Presence of cardiac pacemaker 10/08/2014   Hyperlipidemia 10/08/2014   COPD (chronic obstructive pulmonary disease) ([place]) 02/13/2012   Tobacco abuse 02/13/2012   Sinus congestion 02/13/2012   Current tobacco use 02/13/2012   Nasal congestion 02/13/2012   Tobacco use disorder 02/13/2012   Esophagitis, reflux 08/28/2006   Abdominal pain, right lower quadrant 08/22/2005   Acute appendicitis with peritoneal abscess 08/22/2005    Immunization History  Administered Date(s) Administered   Fluad Quad(high Dose 65+) 08/11/2020, 10/12/2021   Influenza Split 09/20/2006, 09/02/2010, 09/10/2012   Influenza Whole 08/28/2012   Influenza, High Dose Seasonal PF 08/28/2014, 08/10/2015, 07/25/2016, 10/03/2017, 08/30/2018, 09/18/2019   Influenza-Unspecified 08/28/2013   Moderna SARS-COV2 Booster Vaccination 10/12/2020   Moderna Sars-Covid-2 Vaccination 01/09/2020, 02/06/2020, 05/19/2021   Pneumococcal Conjugate-13 08/28/2014   Pneumococcal Polysaccharide-23 03/07/2011   Tdap 08/28/2014   Zoster Recombinat (Shingrix) 05/19/2021   Zoster, Live 11/04/2012    Conditions to be addressed/monitored:  Hypertension,  Hyperlipidemia, GERD, COPD, Depression, Anxiety, Osteoporosis, and Tobacco use  Care Plan : General Pharmacy (Adult)  Updates made by Germaine Pomfret, RPH since 08/26/2022 12:00 AM     Problem: Hypertension, Hyperlipidemia, GERD, COPD, Depression, Anxiety, Osteoporosis, and Tobacco use   Priority: High     Long-Range Goal: Patient-Specific Goal   Start Date: 06/21/2021  Expected End Date: 06/09/2023  This Visit's Progress: On track  Recent Progress: On track  Priority: High  Note:   Current Barriers:  Unable to achieve control of blood pressure  Unable to achieve control of cholesterol  Pharmacist Clinical Goal(s):  Patient will achieve control of blood pressure as evidenced by BP less than 140/90 achieve control of cholesterol as evidenced by LDL less than 100 through collaboration with PharmD and provider.   Interventions: 1:1 collaboration with Jerrol Banana., MD regarding development and update of comprehensive plan of care as evidenced by provider attestation and co-signature Inter-disciplinary care team collaboration (see longitudinal plan of care) Comprehensive medication review performed; medication list updated in electronic medical record  Hypertension (BP goal <140/90) -Uncontrolled -Current treatment: HCTZ 25 mg daily  Losartan 100 mg daily  -Medications previously tried: Amlodipine (fatigue),   -Current home readings: 140/65,  -Current dietary habits: Salts food. Drinks 2 cups caffeine.  -Current exercise habits: Minimal, limited by COPD, breathlessness -Denies hypotensive/hypertensive symptoms -Recommended to continue current medication  Hyperlipidemia: (LDL goal < 100) -Uncontrolled -Current treatment: Ezetimibe 10 mg daily  -Medications previously tried: Statins   -Educated on Importance of limiting foods high in cholesterol; -Recommended to continue current medication  COPD (Goal: control symptoms and prevent  exacerbations) -Controlled -Current treatment  Albuterol 2 puffs every 6 hours as needed Breztri 2 puffs twice daily  -Medications previously tried: NA  -Gold Grade: Gold 2 (FEV1 50-79%) -Current COPD Classification:  B (high sx, <2 exacerbations/yr) -Pulmonary function testing: FEV1  65% (2013) -Exacerbations requiring treatment in last 6 months: No -Shortness of breath, worse in the morning.  -Recommended to continue current medication  Tobacco use (Goal Quit smoking) -Uncontrolled -55 pack year history, currently smoking 1 ppd  -Previous quit attempts: Bupropion, Nicotine Patch, Varenicline -Current treatment  None -Patient smokes Within 30 minutes of waking -Patient is in a pre-contemplative stage of smoking cessation -Recommended to continue current medication  Depression/Anxiety (Goal: Maintain stable mood) -Controlled -Current treatment: Citalopram 10 mg nightly  Mirtazapine 30 mg nightly  Trazodone 150 mg nightly  -Medications previously tried/failed: NA -7 hours sleep. Takes 3 hours to fall asleep. Does wake up multiple times, falls back asleep quickly. -PHQ9: 0 -GAD7: 0 -Recommended to continue current medication  GERD (Goal: Prevent Heartburn) -Controlled -History of Hitial hernia, Barrett's Esophagus   -Current treatment  Pantoprazole 40 mg twice daily  -Medications previously tried: NA  -Very rarely has symptoms. Has only ever taken twice daily.  -Recommended to continue current medication  Chronic Kidney Disease Stage 3b  -All medications assessed for renal dosing and appropriateness in chronic kidney disease. -Recommended to continue current medication  Patient Goals/Self-Care Activities Patient will:  - check blood pressure every other day, document, and provide at future appointments  Follow Up Plan: Telephone follow up appointment with care management team member scheduled for:  12/02/2022 at 1:00 PM      Medication Assistance:  Breztri obtained  through [place].  Enrollment ends NA  Compliance/Adherence/Medication fill history: Care Gaps: Hepatitis C Screening  Star-[facility] Drugs: Losartan 100 mg last filled on 05/28/2021 for a 90-Day supply with [facility]  Patient's preferred pharmacy is:  [facility] [address], [place] [address] Phone: [phone] Fax: [place], [place] - [address] [place] [address] Phone: [phone] Fax: [phone]  [place], [place] [address] Phone: [phone] Fax: [facility] [place], [place] [facility] [address] [place] [address] Phone: [phone] Fax: [phone]   Uses pill box? Yes Pt endorses 100% compliance  Patient decided to: Utilize [facility] pharmacy for medication synchronization, packaging and delivery  Care Plan and Follow Up Patient Decision:  Patient agrees to Care Plan and Follow-up.  Plan: Telephone follow up appointment with care management team member scheduled for:  12/02/2022 at 1:00 PM  [name], PharmD, [place], [place] [phone]

## 2022-08-25 ENCOUNTER — Other Ambulatory Visit: Payer: Self-pay | Admitting: *Deleted

## 2022-08-25 DIAGNOSIS — R911 Solitary pulmonary nodule: Secondary | ICD-10-CM

## 2022-08-25 DIAGNOSIS — Z87891 Personal history of nicotine dependence: Secondary | ICD-10-CM

## 2022-08-26 NOTE — Patient Instructions (Signed)
Visit Information It was great speaking with you today!  Please let me know if you have any questions about our visit.   Goals Addressed             This Visit's Progress    Track and Manage My Blood Pressure-Hypertension   On track    Timeframe:  Long-Range Goal Priority:  High Start Date:  06/21/2021                           Expected End Date:  06/22/2023                     Follow Up within 30 days   - check blood pressure 3 times per week    Why is this important?   You won't feel high blood pressure, but it can still hurt your blood vessels.  High blood pressure can cause heart or kidney problems. It can also cause a stroke.  Making lifestyle changes like losing a little weight or eating less salt will help.  Checking your blood pressure at home and at different times of the day can help to control blood pressure.  If the doctor prescribes medicine remember to take it the way the doctor ordered.  Call the office if you cannot afford the medicine or if there are questions about it.     Notes:         Patient Care Plan: Fall Risk  Completed 12/30/2021   Patient Care Plan: General Pharmacy (Adult)     Problem Identified: Hypertension, Hyperlipidemia, GERD, COPD, Depression, Anxiety, Osteoporosis, and Tobacco use   Priority: High     Long-Range Goal: Patient-Specific Goal   Start Date: 06/21/2021  Expected End Date: 06/09/2023  This Visit's Progress: On track  Recent Progress: On track  Priority: High  Note:   Current Barriers:  Unable to achieve control of blood pressure  Unable to achieve control of cholesterol  Pharmacist Clinical Goal(s):  Patient will achieve control of blood pressure as evidenced by BP less than 140/90 achieve control of cholesterol as evidenced by LDL less than 100 through collaboration with PharmD and provider.   Interventions: 1:1 collaboration with Jerrol Banana., MD regarding development and update of comprehensive plan of  care as evidenced by provider attestation and co-signature Inter-disciplinary care team collaboration (see longitudinal plan of care) Comprehensive medication review performed; medication list updated in electronic medical record  Hypertension (BP goal <140/90) -Uncontrolled -Current treatment: HCTZ 25 mg daily  Losartan 100 mg daily  -Medications previously tried: Amlodipine (fatigue),   -Current home readings: 140/65,  -Current dietary habits: Salts food. Drinks 2 cups caffeine.  -Current exercise habits: Minimal, limited by COPD, breathlessness -Denies hypotensive/hypertensive symptoms -Recommended to continue current medication  Hyperlipidemia: (LDL goal < 100) -Uncontrolled -Current treatment: Ezetimibe 10 mg daily  -Medications previously tried: Statins   -Educated on Importance of limiting foods high in cholesterol; -Recommended to continue current medication  COPD (Goal: control symptoms and prevent exacerbations) -Controlled -Current treatment  Albuterol 2 puffs every 6 hours as needed Breztri 2 puffs twice daily  -Medications previously tried: NA  -Gold Grade: Gold 2 (FEV1 50-79%) -Current COPD Classification:  B (high sx, <2 exacerbations/yr) -Pulmonary function testing: FEV1 65% (2013) -Exacerbations requiring treatment in last 6 months: No -Shortness of breath, worse in the morning.  -Recommended to continue current medication  Tobacco use (Goal Quit smoking) -Uncontrolled -55 pack year history,  currently smoking 1 ppd  -Previous quit attempts: Bupropion, Nicotine Patch, Varenicline -Current treatment  None -Patient smokes Within 30 minutes of waking -Patient is in a pre-contemplative stage of smoking cessation -Recommended to continue current medication  Depression/Anxiety (Goal: Maintain stable mood) -Controlled -Current treatment: Citalopram 10 mg nightly  Mirtazapine 30 mg nightly  Trazodone 150 mg nightly  -Medications previously tried/failed:  NA -7 hours sleep. Takes 3 hours to fall asleep. Does wake up multiple times, falls back asleep quickly. -PHQ9: 0 -GAD7: 0 -Recommended to continue current medication  GERD (Goal: Prevent Heartburn) -Controlled -History of Hitial hernia, Barrett's Esophagus   -Current treatment  Pantoprazole 40 mg twice daily  -Medications previously tried: NA  -Very rarely has symptoms. Has only ever taken twice daily.  -Recommended to continue current medication  Chronic Kidney Disease Stage 3b  -All medications assessed for renal dosing and appropriateness in chronic kidney disease. -Recommended to continue current medication  Patient Goals/Self-Care Activities Patient will:  - check blood pressure every other day, document, and provide at future appointments  Follow Up Plan: Telephone follow up appointment with care management team member scheduled for:  12/02/2022 at 1:00 PM    Patient agreed to services and verbal consent obtained.   Patient verbalizes understanding of instructions and care plan provided today and agrees to view in [place]. Active MyChart status and patient understanding of how to access instructions and care plan via MyChart confirmed with patient.     [name], PharmD, [place], CPP  Clinical Pharmacist Practitioner  [facility] [phone]

## 2022-08-27 DIAGNOSIS — E785 Hyperlipidemia, unspecified: Secondary | ICD-10-CM | POA: Diagnosis not present

## 2022-08-27 DIAGNOSIS — J449 Chronic obstructive pulmonary disease, unspecified: Secondary | ICD-10-CM

## 2022-08-27 DIAGNOSIS — F17210 Nicotine dependence, cigarettes, uncomplicated: Secondary | ICD-10-CM

## 2022-08-27 DIAGNOSIS — I129 Hypertensive chronic kidney disease with stage 1 through stage 4 chronic kidney disease, or unspecified chronic kidney disease: Secondary | ICD-10-CM

## 2022-08-27 DIAGNOSIS — N1832 Chronic kidney disease, stage 3b: Secondary | ICD-10-CM

## 2022-08-31 ENCOUNTER — Ambulatory Visit: Payer: Self-pay

## 2022-08-31 NOTE — Patient Outreach (Cosign Needed)
Error with Encounter: Please disregard

## 2022-09-01 ENCOUNTER — Ambulatory Visit
Admission: RE | Admit: 2022-09-01 | Discharge: 2022-09-01 | Disposition: A | Discharge status: Home / Self Care | Payer: Medicare Other | Source: Ambulatory Visit | Attending: Acute Care | Admitting: Acute Care

## 2022-09-01 DIAGNOSIS — R911 Solitary pulmonary nodule: Secondary | ICD-10-CM | POA: Diagnosis present

## 2022-09-01 DIAGNOSIS — Z87891 Personal history of nicotine dependence: Secondary | ICD-10-CM

## 2022-09-06 ENCOUNTER — Other Ambulatory Visit: Payer: Self-pay | Admitting: Acute Care

## 2022-09-06 DIAGNOSIS — Z87891 Personal history of nicotine dependence: Secondary | ICD-10-CM

## 2022-09-06 DIAGNOSIS — R911 Solitary pulmonary nodule: Secondary | ICD-10-CM

## 2022-10-12 ENCOUNTER — Telehealth: Payer: Self-pay

## 2022-10-12 NOTE — Progress Notes (Signed)
Chronic Care Management Pharmacy Assistant   Name: Crystal White  MRN: [id number] DOB: 1941-02-08  Reason for Encounter: Medication Review/Medication Coordination for [facility]   Recent office visits:  None ID  Recent consult visits:  None ID  Hospital visits:  None in previous 6 months  Medications: Outpatient Encounter Medications as of 10/12/2022  Medication Sig   acetaminophen (TYLENOL) 500 MG tablet Take 1,000 mg by mouth every 6 (six) hours as needed (pain/headaches.).    albuterol (PROVENTIL HFA) 108 (90 Base) MCG/ACT inhaler Inhale 2 puffs into the lungs every 6 (six) hours as needed for wheezing or shortness of breath.   albuterol (PROVENTIL) (2.5 MG/3ML) 0.083% nebulizer solution Take 3 mLs (2.5 mg total) by nebulization every 4 (four) hours as needed for wheezing or shortness of breath.   Budeson-Glycopyrrol-Formoterol (BREZTRI AEROSPHERE) 160-9-4.8 MCG/ACT AERO Inhale 2 puffs into the lungs in the morning and at bedtime.   citalopram (CELEXA) 10 MG tablet Take 1 tablet (10 mg total) by mouth at bedtime.   ezetimibe (ZETIA) 10 MG tablet Take 1 tablet (10 mg total) by mouth daily.   gabapentin (NEURONTIN) 100 MG capsule TAKE 2 CAPSULES BY MOUTH 3  TIMES DAILY   hydrochlorothiazide (HYDRODIURIL) 25 MG tablet Take 1 tablet (25 mg total) by mouth daily.   losartan (COZAAR) 100 MG tablet TAKE ONE TABLET BY MOUTH EVERYDAY AT BEDTIME   melatonin 5 MG TABS Take 2.5 mg by mouth at bedtime.   mirtazapine (REMERON) 30 MG tablet Take 1 tablet (30 mg total) by mouth at bedtime.   OXYGEN Inhale into the lungs daily. Uses at night with CPAP and PRN during daytime   pantoprazole (PROTONIX) 40 MG tablet TAKE 1 TABLET BY MOUTH TWICE  DAILY   traZODone (DESYREL) 150 MG tablet TAKE ONE TABLET BY MOUTH EVERYDAY AT BEDTIME   tretinoin (RETIN-A) 0.025 % cream Apply topically at bedtime.   No facility-administered encounter medications on file as of 10/12/2022.   Care  Gaps: Hepatitis C Screening Zoster Vaccines Dexa Scan  Star Rating Drugs: Losartan 100 mg last filled on 08/24/2022 for a 64-Day supply with [facility]   BP Readings from Last 3 Encounters:  06/15/22 130/62  10/12/21 140/70  09/06/21 (!) 162/96    Lab Results  Component Value Date   HGBA1C 5.5 01/24/2017    Patient obtains medications through Adherence Packaging  30 Days   Last adherence delivery included that I completed in June was: Hydrochlorothiazide 25 mg 1 tablet daily (Breakfast) Mirtazapine 30 mg 1 tablet daily (Bedtime) Pantoprazole 40 mg 1 tablet twice daily (Breakfast, Bedtime) Trazodone 150 mg 1 tablet daily (Bedtime) Ezetimibe (Zetia) 10 mg 1 tablet daily (Breakfast) Citalopram 10 mg 1 tablet daily (Bedtime) Losartan 100 mg 1 tablet daily (Bedtime)  Patient declined medications in June: Breztri 16-9-4.8 mcg inhale 2 puffs in the morning and at bedtime - patient receives this medication through AZ[employer]Me patient assistance program Gabapentin 100 mg take 2 capsules three times daily (Breakfast, Lunch, Bedtime)-Patient states she has more than enough of this medication right now Albuterol 108 Inhale 2 puffs into the lungs every 6 hours prn for wheezing or shortness of breath-PRN medication  Patient is due for next adherence delivery on: 10/25/2022 2nd Route.  Called patient and reviewed medications and coordinated delivery.  This delivery to include: Hydrochlorothiazide 25 mg 1 tablet daily (Breakfast) Mirtazapine 30 mg 1 tablet daily (Bedtime) Pantoprazole 40 mg 1 tablet twice daily (Breakfast, Bedtime) Trazodone 150 mg 1  tablet daily (Bedtime) Ezetimibe (Zetia) 10 mg 1 tablet daily (Breakfast) Citalopram 10 mg 1 tablet daily (Bedtime) Losartan 100 mg 1 tablet daily (Bedtime) Gabapentin 100 mg take 2 capsules three times daily (Breakfast, Lunch, Bedtime)  Patient declined the following medications for the month of November: Breztri 16-9-4.8 mcg inhale 2  puffs in the morning and at bedtime - patient receives this medication through AZ[employer]Me patient assistance program Albuterol 108 Inhale 2 puffs into the lungs every 6 hours prn for wheezing or shortness of breath-PRN medication  Patient needs refills for the month of November: No refills needed for this delivery  Confirmed delivery date of 10/25/2022 2nd Route, advised patient that pharmacy will contact them the morning of delivery.  Patient has a follow-up appointment with [name], CPP on 12/02/2022 @ 1500. Patient is thinking about following Dr. Rosanna Randy to his new clinic. She will update CPP in January.  Lynann Bologna, CPA/CMA Clinical Pharmacist Assistant Phone: [phone]

## 2022-11-09 ENCOUNTER — Telehealth: Payer: Self-pay

## 2022-11-09 NOTE — Progress Notes (Signed)
Chronic Care Management Pharmacy Assistant   Name: Crystal White  MRN: [id number] DOB: Oct 07, 1941  Reason for Encounter: Medication Review/Medication Coordination for [facility]   Recent office visits:  None ID  Recent consult visits:  None ID  Hospital visits:  None in previous 6 months  Medications: Outpatient Encounter Medications as of 11/09/2022  Medication Sig   acetaminophen (TYLENOL) 500 MG tablet Take 1,000 mg by mouth every 6 (six) hours as needed (pain/headaches.).    albuterol (PROVENTIL HFA) 108 (90 Base) MCG/ACT inhaler Inhale 2 puffs into the lungs every 6 (six) hours as needed for wheezing or shortness of breath.   albuterol (PROVENTIL) (2.5 MG/3ML) 0.083% nebulizer solution Take 3 mLs (2.5 mg total) by nebulization every 4 (four) hours as needed for wheezing or shortness of breath.   Budeson-Glycopyrrol-Formoterol (BREZTRI AEROSPHERE) 160-9-4.8 MCG/ACT AERO Inhale 2 puffs into the lungs in the morning and at bedtime.   citalopram (CELEXA) 10 MG tablet Take 1 tablet (10 mg total) by mouth at bedtime.   ezetimibe (ZETIA) 10 MG tablet Take 1 tablet (10 mg total) by mouth daily.   gabapentin (NEURONTIN) 100 MG capsule TAKE 2 CAPSULES BY MOUTH 3  TIMES DAILY   hydrochlorothiazide (HYDRODIURIL) 25 MG tablet Take 1 tablet (25 mg total) by mouth daily.   losartan (COZAAR) 100 MG tablet TAKE ONE TABLET BY MOUTH EVERYDAY AT BEDTIME   melatonin 5 MG TABS Take 2.5 mg by mouth at bedtime.   mirtazapine (REMERON) 30 MG tablet Take 1 tablet (30 mg total) by mouth at bedtime.   OXYGEN Inhale into the lungs daily. Uses at night with CPAP and PRN during daytime   pantoprazole (PROTONIX) 40 MG tablet TAKE 1 TABLET BY MOUTH TWICE  DAILY   traZODone (DESYREL) 150 MG tablet TAKE ONE TABLET BY MOUTH EVERYDAY AT BEDTIME   tretinoin (RETIN-A) 0.025 % cream Apply topically at bedtime.   No facility-administered encounter medications on file as of 11/09/2022.   Care Gaps: Dexa  Scan Medicare Annual Wellness Exam  Star Rating Drugs: Losartan 100 mg last filled on 10/18/2022 for a 30-Day supply with [facility]    BP Readings from Last 3 Encounters:  06/15/22 130/62  10/12/21 140/70  09/06/21 (!) 162/96    Lab Results  Component Value Date   HGBA1C 5.5 01/24/2017    Patient obtains medications through Adherence Packaging  30 Days   Last adherence delivery included:  Hydrochlorothiazide 25 mg 1 tablet daily (Breakfast) Mirtazapine 30 mg 1 tablet daily (Bedtime) Pantoprazole 40 mg 1 tablet twice daily (Breakfast, Bedtime) Trazodone 150 mg 1 tablet daily (Bedtime) Ezetimibe (Zetia) 10 mg 1 tablet daily (Breakfast) Citalopram 10 mg 1 tablet daily (Bedtime) Losartan 100 mg 1 tablet daily (Bedtime) Gabapentin 100 mg take 2 capsules three times daily (Breakfast, Lunch, Bedtime)  Patient declined the following medications for the month of November: Breztri 16-9-4.8 mcg inhale 2 puffs in the morning and at bedtime - patient receives this medication through AZ[employer]Me patient assistance program Albuterol 108 Inhale 2 puffs into the lungs every 6 hours prn for wheezing or shortness of breath-PRN medication  Patient is due for next adherence delivery on: 11/23/2022 2nd Route.  Called patient and reviewed medications and coordinated delivery.  This delivery to include: Hydrochlorothiazide 25 mg 1 tablet daily (Breakfast) Mirtazapine 30 mg 1 tablet daily (Bedtime) Pantoprazole 40 mg 1 tablet twice daily (Breakfast, Bedtime) Trazodone 150 mg 1 tablet daily (Bedtime) Ezetimibe (Zetia) 10 mg 1 tablet daily (Breakfast)  Citalopram 10 mg 1 tablet daily (Bedtime) Losartan 100 mg 1 tablet daily (Bedtime) Gabapentin 100 mg take 2 capsules three times daily (Breakfast, Lunch, Bedtime)  Patient declined the following medications for the month of December: Breztri 16-9-4.8 mcg inhale 2 puffs in the morning and at bedtime - patient receives this medication through AZ[employer]Me  patient assistance program Albuterol 108 Inhale 2 puffs into the lungs every 6 hours prn for wheezing or shortness of breath-PRN medication  Patient needs refills for Hydrochlorothiazide 25 mg, Mirtazapine 30 mg, Pantoprazole 40 mg, Gabapentin 100 mg. All medications are PCP medications that CPP can send in refill for.  Confirmed delivery date of 11/23/2022 2nd Route, advised patient that pharmacy will contact them the morning of delivery.  I spoke with the patient and she reports that she is doing well. Patient denies any ill symptoms at this time. Patient did state that she will be transferring to the office that Dr. Rosanna Randy will be starting at in January. I informed patient she can still receive her medications via [facility] she will just need to have Dr. Rosanna Randy send over all new prescriptions for her.   CPP has been notified and patient does want to keep her January appointment.  Patient has a follow-up with CPP on 12/02/2022 @ 1300.  Lynann Bologna, CPA/CMA Clinical Pharmacist Assistant Phone: [phone]

## 2022-11-11 MED ORDER — MIRTAZAPINE 30 MG PO TABS: 30.0000 mg | ORAL_TABLET | Freq: Every day | ORAL | 1 refills | Status: AC

## 2022-11-11 MED ORDER — HYDROCHLOROTHIAZIDE 25 MG PO TABS: 25.0000 mg | ORAL_TABLET | Freq: Every day | ORAL | 1 refills | Status: AC

## 2022-11-11 MED ORDER — GABAPENTIN 100 MG PO CAPS: ORAL_CAPSULE | ORAL | 1 refills | Status: AC

## 2022-11-11 MED ORDER — PANTOPRAZOLE SODIUM 40 MG PO TBEC: 40.0000 mg | DELAYED_RELEASE_TABLET | Freq: Two times a day (BID) | ORAL | 1 refills | Status: AC

## 2022-11-11 NOTE — Addendum Note (Signed)
Addended by: Daron Offer A on: 11/11/2022 01:37 PM   Modules accepted: Orders

## 2022-12-02 ENCOUNTER — Telehealth: Payer: Medicare Other

## 2022-12-14 ENCOUNTER — Ambulatory Visit: Payer: Medicare Other | Admitting: Pulmonary Disease

## 2022-12-14 ENCOUNTER — Encounter: Payer: Self-pay | Admitting: Pulmonary Disease

## 2022-12-14 VITALS — BP 118/80 | HR 68 | Temp 98.2°F | Ht 65.0 in | Wt 136.8 lb

## 2022-12-14 DIAGNOSIS — F17210 Nicotine dependence, cigarettes, uncomplicated: Secondary | ICD-10-CM

## 2022-12-14 DIAGNOSIS — J9611 Chronic respiratory failure with hypoxia: Secondary | ICD-10-CM

## 2022-12-14 DIAGNOSIS — J441 Chronic obstructive pulmonary disease with (acute) exacerbation: Secondary | ICD-10-CM

## 2022-12-14 MED ORDER — AZITHROMYCIN 250 MG PO TABS: ORAL_TABLET | ORAL | 0 refills | Status: AC

## 2022-12-14 MED ORDER — METHYLPREDNISOLONE 4 MG PO TBPK: ORAL_TABLET | ORAL | 0 refills | Status: DC

## 2022-12-14 NOTE — Progress Notes (Signed)
Subjective:    Patient ID: Crystal White, female    DOB: 10-11-1941, 82 y.o.   MRN: [id number] Patient Care Team: Jerrol Banana., MD as PCP - General (Unknown Physician Specialty) Samara Deist, DPM as Referring Physician (Podiatry) Isaias Cowman, MD as Consulting Physician (Cardiology) Tyler Pita, MD as Consulting Physician (Pulmonary Disease)  Chief Complaint  Patient presents with   Follow-up    SOB with exertion. Cough with yellow sputum. Some wheezing when she has a coughing spell.    HPI This is an 82 year old current smoker (half PPD) who follows here for the issue of COPD and dyspnea.  This is a scheduled appointment.  I last saw this patient on 02 Apr 2021 whereupon she was lost to follow-up and in the interim saw our nurse practitioner Derl Barrow, NP on 15 June 2022.  At that time she was well compensated on Breztri 2 puffs twice a day.  She also had her CPAP compliance checked and it was stellar.  She has carried a diagnosis of sleep apnea since 2016.  Patient presents without using her oxygen which was previously prescribed.  Oxygen saturations were noted to be 84% when she walked from the waiting room to the exam room.  She did not exhibit significant tachypnea.  She has had however shortness of breath on exertion.  He has noted increasing wheezing and cough over the last week and sputum changing from yellow to green.  No fevers, chills or sweats.  No chest pain.  No lower extremity edema or calf tenderness.  She is only using oxygen bled into the CPAP nocturnally.   Review of Systems A 10 point review of systems was performed and it is as noted above otherwise negative.  Past Medical History:  Diagnosis Date   Arthritis    hands, lower back   Barrett's esophagus    COPD (chronic obstructive pulmonary disease) (HCC)    Emphysema lung (HCC)    GERD (gastroesophageal reflux disease)    Hernia of abdominal cavity    Hyperlipidemia    Hypertension     Mobitz type II atrioventricular block    OSA (obstructive sleep apnea)    on CPAP   Pacemaker    Peripheral neuropathy    Presence of permanent cardiac pacemaker 09/22/2011   [place] - EVIADR-T [facility])   Shortness of breath dyspnea    Stomach ulcer    Past Surgical History:  Procedure Laterality Date   APPENDECTOMY  2006   Dr. Gae Dry LIFT Bilateral 05/03/2016   Procedure: BLEPHAROPLASTY  BILATERAL UPPER EYELIDS WITH EXCESS SKIN REMOVAL BILATERAL BROW PTOSIS REPAIR BLEPHAROPTOSIS REPAIR BILATERAL EYES ;  Surgeon: Karle Starch, MD;  Location: [facility];  Service: Ophthalmology;  Laterality: Bilateral;   CARDIAC CATHETERIZATION  2007   COLON SURGERY     COLONOSCOPY WITH PROPOFOL N/A 04/14/2015   Procedure: COLONOSCOPY WITH PROPOFOL;  Surgeon: Lollie Sails, MD;  Location: [facility] ENDOSCOPY;  Service: Endoscopy;  Laterality: N/A;   COLONOSCOPY WITH PROPOFOL N/A 01/24/2019   Procedure: COLONOSCOPY WITH PROPOFOL;  Surgeon: Lollie Sails, MD;  Location: [facility] ENDOSCOPY;  Service: Endoscopy;  Laterality: N/A;   COLONOSCOPY WITH PROPOFOL N/A 06/11/2019   Procedure: COLONOSCOPY WITH PROPOFOL;  Surgeon: Lollie Sails, MD;  Location: [facility] ENDOSCOPY;  Service: Endoscopy;  Laterality: N/A;   COLONOSCOPY WITH PROPOFOL N/A 11/23/2020   Procedure: COLONOSCOPY WITH PROPOFOL;  Surgeon: Lesly Rubenstein, MD;  Location: [facility] ENDOSCOPY;  Service: Endoscopy;  Laterality: N/A;   ESOPHAGOGASTRODUODENOSCOPY N/A 04/14/2015   Procedure: ESOPHAGOGASTRODUODENOSCOPY (EGD);  Surgeon: Lollie Sails, MD;  Location[facility] ENDOSCOPY;  Service: Endoscopy;  Laterality: N/A;   ESOPHAGOGASTRODUODENOSCOPY (EGD) WITH PROPOFOL N/A 07/05/2017   Procedure: ESOPHAGOGASTRODUODENOSCOPY (EGD) WITH PROPOFOL;  Surgeon: Manya Silvas, MD;  Location: [facility] ENDOSCOPY;  Service: Endoscopy;  Laterality: N/A;   ESOPHAGOGASTRODUODENOSCOPY (EGD) WITH PROPOFOL N/A 01/24/2019   Procedure:  ESOPHAGOGASTRODUODENOSCOPY (EGD) WITH PROPOFOL;  Surgeon: Lollie Sails, MD;  Location: [facility] ENDOSCOPY;  Service: Endoscopy;  Laterality: N/A;   ESOPHAGOGASTRODUODENOSCOPY (EGD) WITH PROPOFOL N/A 11/23/2020   Procedure: ESOPHAGOGASTRODUODENOSCOPY (EGD) WITH PROPOFOL;  Surgeon: Lesly Rubenstein, MD;  Location: [facility] ENDOSCOPY;  Service: Endoscopy;  Laterality: N/A;   HERNIA REPAIR     HIATAL HERNIA REPAIR  2007   INSERT / REPLACE / REMOVE PACEMAKER     LAPAROSCOPIC RIGHT HEMI COLECTOMY Right 06/22/2015   Procedure: LAPAROSCOPIC RIGHT HEMI COLECTOMY;  Surgeon: Marlyce Huge, MD;  Location: [facility] ORS;  Service: General;  Laterality: Right;   NISSEN FUNDOPLICATION  6812   OPEN REDUCTION INTERNAL FIXATION (ORIF) DISTAL RADIAL FRACTURE Left 08/22/2019   Procedure: OPEN REDUCTION INTERNAL FIXATION (ORIF) DISTAL RADIAL FRACTURE;  Surgeon: Hessie Knows, MD;  Location: [facility] ORS;  Service: Orthopedics;  Laterality: Left;   PACEMAKER INSERTION  09/22/11   [facility]  - [employer]ron[employer] EVIADR-T   PPM GENERATOR CHANGEOUT N/A 02/02/2021   Procedure: PPM GENERATOR CHANGEOUT;  Surgeon: Isaias Cowman, MD;  Location: [place] CV LAB;  Service: Cardiovascular;  Laterality: N/A;   TOTAL ABDOMINAL HYSTERECTOMY  1980   Patient Active Problem List   Diagnosis Date Noted   Wears partial dentures 07/17/2019   Incisional hernia, without obstruction or gangrene 06/03/2016   Osteoporosis 02/22/2016   Musculoskeletal chest pain 07/14/2015   Chronic respiratory failure ([place]) 07/14/2015   Acute bronchitis 07/14/2015   Other emphysema ([place]) 07/14/2015   Chronic renal insufficiency 07/14/2015   Chest pain 07/13/2015   Multiple pulmonary nodules 06/02/2015   Colon polyp 05/29/2015   Hemoptysis 04/29/2015   Sprain of ankle 04/14/2015   Anxiety 04/14/2015   Barrett's esophagus 04/14/2015   Acute exacerbation of chronic obstructive airways disease ([place]) 04/14/2015   Bursitis 04/14/2015   Cellulitis  04/14/2015   Claudication ([place]) 04/14/2015   Cough 04/14/2015   Deficiency, 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   Clinical depression 04/14/2015   Dizziness 04/14/2015   Dyslipidemia 04/14/2015   Essential (primary) hypertension 04/14/2015   Flank pain 04/14/2015   H/O suicide attempt 04/14/2015   Dysphonia 04/14/2015   Hypercholesteremia 04/14/2015   Cannot sleep 04/14/2015   Malaise and fatigue 04/14/2015   Mild cognitive impairment 04/14/2015   Cramp in muscle 04/14/2015   Muscle ache 04/14/2015   Neuropathy 04/14/2015   Adiposity 04/14/2015   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   Candida infection of mouth 04/14/2015   Abnormal loss of weight 04/14/2015   Decreased body weight 04/14/2015   Artificial cardiac pacemaker 10/08/2014   Apnea, sleep 10/08/2014   Presence of cardiac pacemaker 10/08/2014   Hyperlipidemia 10/08/2014   COPD (chronic obstructive pulmonary disease) ([place]) 02/13/2012   Tobacco abuse 02/13/2012   Sinus congestion 02/13/2012   Current tobacco use 02/13/2012   Nasal congestion 02/13/2012   Tobacco use disorder 02/13/2012   Esophagitis, reflux 08/28/2006   Abdominal pain, right lower quadrant 08/22/2005   Acute appendicitis with peritoneal abscess 08/22/2005   Family History  Adopted: Yes  Problem Relation Age of Onset   Diabetes Son  Social History   Tobacco Use   Smoking status: Every Day    Packs/day: 1.00    Years: 55.00    Total pack years: 55.00    Types: Cigarettes   Smokeless tobacco: Never   Tobacco comments:    10-12 cigarettes daily 12/14/2022  Substance Use Topics   Alcohol use: Yes    Alcohol/week: 0.0 standard drinks of alcohol    Comment: 1/2 glass wine -rare   Allergies  Allergen Reactions   Iodinated Contrast Media Other (See Comments)    Burning sensation during contrast media injections. Patient states the IV contrast makes her very hot. She has had multiple CT Scans with IV contrast and states she only gets the warm  sensation. This is not an allergic reaction, but is noted in her [place] chart as well. Aggie Hacker, ARRT R CT, had this discussion with her on 01/10/19 at 1130am.   Bacitracin Swelling and Rash   Statins Other (See Comments) and Rash    Reaction:  Leg cramps  Reaction:  Leg cramps    Current Meds  Medication Sig   acetaminophen (TYLENOL) 500 MG tablet Take 1,000 mg by mouth every 6 (six) hours as needed (pain/headaches.).    albuterol (PROVENTIL HFA) 108 (90 Base) MCG/ACT inhaler Inhale 2 puffs into the lungs every 6 (six) hours as needed for wheezing or shortness of breath.   albuterol (PROVENTIL) (2.5 MG/3ML) 0.083% nebulizer solution Take 3 mLs (2.5 mg total) by nebulization every 4 (four) hours as needed for wheezing or shortness of breath.   Budeson-Glycopyrrol-Formoterol (BREZTRI AEROSPHERE) 160-9-4.8 MCG/ACT AERO Inhale 2 puffs into the lungs in the morning and at bedtime.   citalopram (CELEXA) 10 MG tablet Take 1 tablet (10 mg total) by mouth at bedtime.   ezetimibe (ZETIA) 10 MG tablet Take 1 tablet (10 mg total) by mouth daily.   gabapentin (NEURONTIN) 100 MG capsule TAKE 2 CAPSULES BY MOUTH 3  TIMES DAILY   hydrochlorothiazide (HYDRODIURIL) 25 MG tablet Take 1 tablet (25 mg total) by mouth daily.   losartan (COZAAR) 100 MG tablet TAKE ONE TABLET BY MOUTH EVERYDAY AT BEDTIME   melatonin 5 MG TABS Take 2.5 mg by mouth at bedtime.   mirtazapine (REMERON) 30 MG tablet Take 1 tablet (30 mg total) by mouth at bedtime.   OXYGEN Inhale into the lungs daily. Uses at night with CPAP and PRN during daytime   pantoprazole (PROTONIX) 40 MG tablet Take 1 tablet (40 mg total) by mouth 2 (two) times daily.   traZODone (DESYREL) 150 MG tablet TAKE ONE TABLET BY MOUTH EVERYDAY AT BEDTIME   Immunization History  Administered Date(s) Administered   Fluad Quad(high Dose 65+) 08/11/2020, 10/12/2021   Influenza Split 09/20/2006, 09/02/2010, 09/10/2012   Influenza Whole 08/28/2012   Influenza,  High Dose Seasonal PF 08/28/2014, 08/10/2015, 07/25/2016, 10/03/2017, 08/30/2018, 09/18/2019, 09/01/2022   Influenza-Unspecified 08/28/2013   Moderna Covid-19 Vaccine Bivalent Booster 57yr & up 09/01/2022   Moderna SARS-COV2 Booster Vaccination 10/12/2020   Moderna Sars-Covid-2 Vaccination 01/09/2020, 02/06/2020, 05/19/2021   Pneumococcal Conjugate-13 08/28/2014   Pneumococcal Polysaccharide-23 03/07/2011   Respiratory Syncytial Virus Vaccine,Recomb Aduvanted(Arexvy) 10/11/2022   Tdap 08/28/2014   Zoster Recombinat (Shingrix) 05/19/2021, 10/11/2022   Zoster, Live 11/04/2012       Objective:   Physical Exam BP 118/80 (BP Location: Left Arm, Cuff Size: Normal)   Pulse 68   Temp 98.2 F (36.8 C)   Ht '5\' 5"'$  (1.651 m)   Wt 136 lb 12.8 oz (62.1  kg)   SpO2 (!) 89%   BMI 22.76 kg/m   SpO2: (!) 89 % O2 Device: None (Room air)  GENERAL: Well-developed patient elderly woman in no acute distress.  Fully ambulatory, mild to moderate use of accessories of respiration HEAD: Normocephalic, atraumatic.  EYES: Pupils equal, round, reactive to light.  No scleral icterus.  MOUTH: Dentition intact, no thrush. NECK: Supple. No thyromegaly. Trachea midline. No JVD.  No adenopathy. PULMONARY: Increased AP diameter.  Distant breath sounds, scattered wheezes and rhonchi throughout.   CARDIOVASCULAR: S1 and S2. Regular rate and rhythm.  No rubs, murmurs or gallops heard.   ABDOMEN: Benign. MUSCULOSKELETAL: Kyphosis, no joint deformity, no clubbing, no edema.  NEUROLOGIC: No overt focal deficit.  Speech is fluent. SKIN: Intact,warm,dry.  Limited exam: No rashes. PSYCH: Mood and behavior normal.     Assessment & Plan:     ICD-10-CM   1. COPD with acute exacerbation ([place])  J44.1    Will treat with methylprednisolone and azithromycin Continue Breztri 2 puffs twice a day Continue as needed albuterol    2. Chronic respiratory failure with hypoxia (HCC)  J96.11    Needs to use her oxygen which she  already has Liter flow should be 2 L/min Use 24/7    3. Tobacco dependence due to cigarettes  F17.210    Patient counseled with regards discontinuation of smoking Total counseling time 3 to 5 minutes     Meds ordered this encounter  Medications   methylPREDNISolone (MEDROL DOSEPAK) 4 MG TBPK tablet    Sig: Take as directed in the package, this is a taper pack.    Dispense:  21 tablet    Refill:  0   azithromycin (ZITHROMAX) 250 MG tablet    Sig: Take 2 tablets (500 mg) on  Day 1,  followed by 1 tablet (250 mg) once daily on Days 2 through 5.    Dispense:  6 each    Refill:  0   We provided the patient with a spacer for her Breztri inhaler.  She was counseled with regards to discontinuation of smoking and with regards to using oxygen consistently.  Will see the patient in follow-up in 4 to 6 weeks time she is to contact us prior to that time should any new difficulties arise.  Renold Don, MD Advanced Bronchoscopy PCCM [facility]    *This note was dictated using voice recognition software/Dragon.  Despite best efforts to proofread, errors can occur which can change the meaning. Any transcriptional errors that result from this process are unintentional and may not be fully corrected at the time of dictation.

## 2022-12-14 NOTE — Patient Instructions (Addendum)
We are sending in a prednisone type medication (Medrol) and an antibiotic pack to treat your exacerbation.  You have a COPD exacerbation.  We have provided you a spacer for the Breztri, the Judithann Sauger is 2 puffs twice a day you can use your albuterol as needed.  Please work on quitting smoking.  You need to use your oxygen consistently at 2 L/min particularly now.  Today in the office you go as low as 84% without the oxygen.  We will see you in follow-up in 4 to 6 weeks time with either me or the nurse practitioner at that time.

## 2023-01-10 ENCOUNTER — Telehealth: Payer: Self-pay

## 2023-01-10 NOTE — Telephone Encounter (Signed)
No Answer/Busy - i called pt or AWV: msg saying pt is unavailble and voicemail is full  Left Message - i called pt son:lft msg for pt to rtn call or r/s via phone or mychart.  Pt rescheduled for 02/08/2023 at 2pm

## 2023-01-18 ENCOUNTER — Telehealth: Payer: Self-pay | Admitting: Family Medicine

## 2023-01-18 NOTE — Telephone Encounter (Signed)
[facility] faxed refill request for the following medications:    losartan (COZAAR) 100 MG tablet  Please advise

## 2023-01-18 NOTE — Telephone Encounter (Signed)
Established patient at [facility] in [facility]

## 2023-02-02 ENCOUNTER — Ambulatory Visit: Payer: Medicare Other | Admitting: Pulmonary Disease

## 2023-02-02 ENCOUNTER — Encounter: Payer: Self-pay | Admitting: Pulmonary Disease

## 2023-02-02 VITALS — BP 146/84 | HR 71 | Temp 98.0°F | Ht 65.0 in | Wt 133.4 lb

## 2023-02-02 DIAGNOSIS — F17210 Nicotine dependence, cigarettes, uncomplicated: Secondary | ICD-10-CM

## 2023-02-02 DIAGNOSIS — J449 Chronic obstructive pulmonary disease, unspecified: Secondary | ICD-10-CM

## 2023-02-02 DIAGNOSIS — J9611 Chronic respiratory failure with hypoxia: Secondary | ICD-10-CM

## 2023-02-02 DIAGNOSIS — R918 Other nonspecific abnormal finding of lung field: Secondary | ICD-10-CM

## 2023-02-02 DIAGNOSIS — G4733 Obstructive sleep apnea (adult) (pediatric): Secondary | ICD-10-CM

## 2023-02-02 NOTE — Progress Notes (Signed)
Subjective:    Patient ID: Crystal White, female    DOB: [birth year], 82 y.o.   MRN: [id number] Patient Care Team: Eulas Post, MD as PCP - General (Family Medicine) Samara Deist, DPM as Referring Physician (Podiatry) Isaias Cowman, MD as Consulting Physician (Cardiology) Tyler Pita, MD as Consulting Physician (Pulmonary Disease)  Chief Complaint  Patient presents with   Follow-up    DOE. A little wheezing. Cough with clear sputum.    HPI This is an 82 year old current smoker (half PPD) who follows here for the issue of COPD and dyspnea.  This is a scheduled appointment.  I last saw this patient on 14 December 2022.  At that time she had COPD exacerbation.  She was treated with methylprednisolone and azithromycin.  She was also given a spacer for use with her Breztri inhaler.  She resolved her issues and is doing well since then.    Today she presents without having any fevers, chills or sweats since her most recent exacerbation.  No orthopnea or paroxysmal nocturnal dyspnea.  No lower extremity edema or calf tenderness.  She notes some dyspnea on exertion which is stable and without change in pattern.  He is able to perform her activities of daily living without any major disruption.  Notes occasional wheezing.  Has chronic cough productive of clear sputum.  No hemoptysis.  Cough is usually more present in the mornings.  He requested a new nebulizer machine today as hers is broken.  She has obstructive sleep apnea and is on CPAP at 8 cm of water pressure.  Her compliance is impeccable.  Residual AHI 0 with the therapy.   Patient presents without using her oxygen which was previously prescribed. She is only using oxygen bled into the CPAP nocturnally.  She is enrolled in lung cancer screening, last imaging was performed 01 September 2022 and showed lung RADS 3S due to multiple evanescent nodules, likely related to inflammatory process however need careful  follow-up.  Review of Systems A 10 point review of systems was performed and it is as noted above otherwise negative.  Patient Active Problem List   Diagnosis Date Noted   Wears partial dentures 07/17/2019   Incisional hernia, without obstruction or gangrene 06/03/2016   Osteoporosis 02/22/2016   Musculoskeletal chest pain 07/14/2015   Chronic respiratory failure ([place]) 07/14/2015   Acute bronchitis 07/14/2015   Other emphysema ([place]) 07/14/2015   Chronic renal insufficiency 07/14/2015   Chest pain 07/13/2015   Multiple pulmonary nodules 06/02/2015   Colon polyp 05/29/2015   Hemoptysis 04/29/2015   Sprain of ankle 04/14/2015   Anxiety 04/14/2015   Barrett's esophagus 04/14/2015   Acute exacerbation of chronic obstructive airways disease ([place]) 04/14/2015   Bursitis 04/14/2015   Cellulitis 04/14/2015   Claudication ([place]) 04/14/2015   Cough 04/14/2015   Deficiency, 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   Clinical depression 04/14/2015   Dizziness 04/14/2015   Dyslipidemia 04/14/2015   Essential (primary) hypertension 04/14/2015   Flank pain 04/14/2015   H/O suicide attempt 04/14/2015   Dysphonia 04/14/2015   Hypercholesteremia 04/14/2015   Cannot sleep 04/14/2015   Malaise and fatigue 04/14/2015   Mild cognitive impairment 04/14/2015   Cramp in muscle 04/14/2015   Muscle ache 04/14/2015   Neuropathy 04/14/2015   Adiposity 04/14/2015   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   Candida infection of mouth 04/14/2015   Abnormal loss of weight 04/14/2015   Decreased body weight 04/14/2015   Artificial cardiac pacemaker 10/08/2014   Apnea, sleep 10/08/2014  Presence of cardiac pacemaker 10/08/2014   Hyperlipidemia 10/08/2014   COPD (chronic obstructive pulmonary disease) ([place]) 02/13/2012   Tobacco abuse 02/13/2012   Sinus congestion 02/13/2012   Current tobacco use 02/13/2012   Nasal congestion 02/13/2012   Tobacco use disorder 02/13/2012   Esophagitis, reflux 08/28/2006    Abdominal pain, right lower quadrant 08/22/2005   Acute appendicitis with peritoneal abscess 08/22/2005   Social History   Tobacco Use   Smoking status: Every Day    Packs/day: 1.00    Years: 55.00    Total pack years: 55.00    Types: Cigarettes   Smokeless tobacco: Never   Tobacco comments:    0.5 PPD 02/02/2023  Substance Use Topics   Alcohol use: Yes    Alcohol/week: 0.0 standard drinks of alcohol    Comment: 1/2 glass wine -rare   Allergies  Allergen Reactions   Iodinated Contrast Media Other (See Comments)    Burning sensation during contrast media injections. Patient states the IV contrast makes her very hot. She has had multiple CT Scans with IV contrast and states she only gets the warm sensation. This is not an allergic reaction, but is noted in her [facility] chart as well. Aggie Hacker, ARRT R CT, had this discussion with her on 01/10/19 at 1130am.   Bacitracin Swelling and Rash   Statins Other (See Comments) and Rash    Reaction:  Leg cramps  Reaction:  Leg cramps    Current Meds  Medication Sig   acetaminophen (TYLENOL) 500 MG tablet Take 1,000 mg by mouth every 6 (six) hours as needed (pain/headaches.).    albuterol (PROVENTIL HFA) 108 (90 Base) MCG/ACT inhaler Inhale 2 puffs into the lungs every 6 (six) hours as needed for wheezing or shortness of breath.   albuterol (PROVENTIL) (2.5 MG/3ML) 0.083% nebulizer solution Take 3 mLs (2.5 mg total) by nebulization every 4 (four) hours as needed for wheezing or shortness of breath.   Budeson-Glycopyrrol-Formoterol (BREZTRI AEROSPHERE) 160-9-4.8 MCG/ACT AERO Inhale 2 puffs into the lungs in the morning and at bedtime.   citalopram (CELEXA) 10 MG tablet Take 1 tablet (10 mg total) by mouth at bedtime.   ezetimibe (ZETIA) 10 MG tablet Take 1 tablet (10 mg total) by mouth daily.   gabapentin (NEURONTIN) 100 MG capsule TAKE 2 CAPSULES BY MOUTH 3  TIMES DAILY   hydrochlorothiazide (HYDRODIURIL) 25 MG tablet Take 1 tablet (25  mg total) by mouth daily.   losartan (COZAAR) 100 MG tablet TAKE ONE TABLET BY MOUTH EVERYDAY AT BEDTIME   melatonin 5 MG TABS Take 2.5 mg by mouth at bedtime.   mirtazapine (REMERON) 30 MG tablet Take 1 tablet (30 mg total) by mouth at bedtime.   OXYGEN Inhale into the lungs daily. Uses at night with CPAP and PRN during daytime   pantoprazole (PROTONIX) 40 MG tablet Take 1 tablet (40 mg total) by mouth 2 (two) times daily.   traZODone (DESYREL) 150 MG tablet TAKE ONE TABLET BY MOUTH EVERYDAY AT BEDTIME   Immunization History  Administered Date(s) Administered   Fluad Quad(high Dose 65+) 08/11/2020, 10/12/2021   Influenza Split 09/20/2006, 09/02/2010, 09/10/2012   Influenza Whole 08/28/2012   Influenza, High Dose Seasonal PF 08/28/2014, 08/10/2015, 07/25/2016, 10/03/2017, 08/30/2018, 09/18/2019, 09/01/2022   Influenza-Unspecified 08/28/2013   Moderna Covid-19 Vaccine Bivalent Booster 41yr & up 09/01/2022   Moderna SARS-COV2 Booster Vaccination 10/12/2020   Moderna Sars-Covid-2 Vaccination 01/09/2020, 02/06/2020, 05/19/2021   Pneumococcal Conjugate-13 08/28/2014   Pneumococcal Polysaccharide-23 03/07/2011   Respiratory  Syncytial Virus Vaccine,Recomb Aduvanted(Arexvy) 10/11/2022   Tdap 08/28/2014   Zoster Recombinat (Shingrix) 05/19/2021, 10/11/2022   Zoster, Live 11/04/2012       Objective:   Physical Exam BP (!) 146/84 (BP Location: Left Arm, Cuff Size: Normal)   Pulse 71   Temp 98 F (36.7 C)   Ht '5\' 5"'$  (1.651 m)   Wt 133 lb 6.4 oz (60.5 kg)   SpO2 91%   BMI 22.20 kg/m   SpO2: 91 % O2 Device: None (Room air)  GENERAL: Well-developed patient elderly woman in no acute distress.  Fully ambulatory, mild to moderate use of accessories of respiration HEAD: Normocephalic, atraumatic.  EYES: Pupils equal, round, reactive to light.  No scleral icterus.  MOUTH: Nose/mouth/throat not examined due to institutional masking requirements.   NECK: Supple. No thyromegaly. Trachea  midline. No JVD.  No adenopathy. PULMONARY: Increased AP diameter.  Distant breath sounds, scattered wheezes and rhonchi throughout.   CARDIOVASCULAR: S1 and S2. Regular rate and rhythm.  No rubs, murmurs or gallops heard.   ABDOMEN: Benign. MUSCULOSKELETAL: Kyphosis, no joint deformity, no clubbing, no edema.  NEUROLOGIC: No overt focal deficit.  Speech is fluent. SKIN: Intact,warm,dry.  Limited exam: No rashes. PSYCH: Mood and behavior normal.   Ambulatory oximetry was performed today: Oxygen saturation nadir was 86% patient required 3 L/min to maintain oxygen saturations at 89% or better.    Assessment & Plan:     ICD-10-CM   1. Stage 3 severe COPD by GOLD classification (HCC)  J44.9 AMB REFERRAL FOR DME    AMB REFERRAL FOR DME   Continue Breztri 2 puffs twice a day Continue as needed albuterol Sent request for nebulizer machine    2. Chronic respiratory failure with hypoxia (HCC)  J96.11 AMB REFERRAL FOR DME   Reiterated need for oxygen during ambulation/activity 3 L/min with activity 2 L/min bled into CPAP    3. Obstructive sleep apnea syndrome  G47.33    Compliant with therapy Continue CPAP with oxygen bled in    4. Abnormal CT lung screening  R91.8    Follow-up CT chest pending    5. Tobacco dependence due to cigarettes  F17.210    Counseled regards to discontinuation of smoking Total counseling time 3 to 5 minutes She is enrolled in lung cancer screening     Orders Placed This Encounter  Procedures   AMB REFERRAL FOR DME    Referral Priority:   Routine    Referral Type:   Durable Medical Equipment Purchase    Number of Visits Requested:   1   AMB REFERRAL FOR DME    Referral Priority:   Routine    Referral Type:   Durable Medical Equipment Purchase    Number of Visits Requested:   1   Will see the patient in follow-up in 3 months time she is to contact us prior to that time should any new problems arise.  Renold Don, MD Advanced Bronchoscopy PCCM  [facility]    *This note was dictated using voice recognition software/Dragon.  Despite best efforts to proofread, errors can occur which can change the meaning. Any transcriptional errors that result from this process are unintentional and may not be fully corrected at the time of dictation.

## 2023-02-02 NOTE — Patient Instructions (Signed)
For oxygen today.  You will need 3 L when you are up and about.  If you are sitting quietly like reading or watching TV you can be off of the oxygen.  Continue your CPAP therapy.  Please work on quitting smoking.  Continue your [employer].  We will see you in follow-up in 3 months time call sooner should any new problems arise.

## 2023-02-04 ENCOUNTER — Encounter: Payer: Self-pay | Admitting: Pulmonary Disease

## 2023-02-08 ENCOUNTER — Telehealth: Payer: Self-pay

## 2023-02-08 NOTE — Telephone Encounter (Signed)
No Answer/Busy - I called pt to do AWV. The mailbox was full and unable to leave a msg.   No Answer/Busy - I called pt contact/son to inquire of pt:AWV scheduled. No answer, no vm left on unidentified   No Answer/Busy - called for AWV:no answer or ability to leave vm.

## 2023-03-07 ENCOUNTER — Ambulatory Visit
Admission: RE | Admit: 2023-03-07 | Discharge: 2023-03-07 | Disposition: A | Discharge status: Home / Self Care | Payer: Medicare Other | Source: Ambulatory Visit | Attending: Acute Care | Admitting: Acute Care

## 2023-03-07 DIAGNOSIS — Z87891 Personal history of nicotine dependence: Secondary | ICD-10-CM | POA: Diagnosis present

## 2023-03-07 DIAGNOSIS — R911 Solitary pulmonary nodule: Secondary | ICD-10-CM | POA: Insufficient documentation

## 2023-03-09 ENCOUNTER — Telehealth: Payer: Self-pay | Admitting: Acute Care

## 2023-03-09 ENCOUNTER — Other Ambulatory Visit: Payer: Self-pay

## 2023-03-09 DIAGNOSIS — R633 Feeding difficulties, unspecified: Secondary | ICD-10-CM

## 2023-03-09 DIAGNOSIS — R911 Solitary pulmonary nodule: Secondary | ICD-10-CM

## 2023-03-09 DIAGNOSIS — R1310 Dysphagia, unspecified: Secondary | ICD-10-CM

## 2023-03-09 DIAGNOSIS — Z87891 Personal history of nicotine dependence: Secondary | ICD-10-CM

## 2023-03-09 NOTE — Telephone Encounter (Signed)
Results/plan faxed to PCP and order placed for 3 months LDCT follow up for nodule 

## 2023-03-09 NOTE — Telephone Encounter (Signed)
I have called the patient with the results of her low-dose screening CT.  I explained that her scan was read as incomplete, lung RADS 0.  There are numerous new irregular solid pulmonary nodules that are likely infectious. Pulmonary nodules which were new on the most recent previous scan are now absent or decreased in size. When I asked the patient if she has had a respiratory illness she stated that she feels like she is more congested in her chest.  I asked her if she feels like she gets choked on her food and she confirms that she does feel she gets choked on her food and oftentimes even her saliva. I explained that the plan will be for a follow-up scan in 3 months to reevaluate these most recently discovered nodules however we suspect they are infectious or inflammatory. I told her I will touch base with Dr. Jayme Cloud to see if she wants her to come in prior to her follow-up scan in 3 months.She may need a swallow eval. Denise patient will need a 20-month follow-up low-dose screening CT, and I will make sure Dr. Jayme Cloud is aware of these findings.Please fax results to PCP and let them know plan for follow up. Thanks so much Pt. Is in agreement with this plan.

## 2023-03-13 NOTE — Addendum Note (Signed)
Addended by: Bonney Leitz on: 03/13/2023 12:20 PM   Modules accepted: Orders

## 2023-03-13 NOTE — Telephone Encounter (Signed)
Lets set Crystal White up for swallow eval: Dx; Dysphagia.

## 2023-03-13 NOTE — Telephone Encounter (Signed)
I have placed the order and notified the patient.

## 2023-03-15 ENCOUNTER — Telehealth: Payer: Self-pay | Admitting: Pulmonary Disease

## 2023-03-15 NOTE — Telephone Encounter (Signed)
Crystal White with scheduling and I have been trying to get the swallowing test scheduled for this patient. When I tried her yesterday voicemail box was full and I couldn't leave a message. When I spoke with [name] today about scheduling the swallowing test she didn't feel like it was necessary at this time.  She knows that she has a problem from time to time but not daily and knows how to deal with.

## 2023-03-15 NOTE — Telephone Encounter (Signed)
Noted  

## 2023-05-18 ENCOUNTER — Encounter: Payer: Self-pay | Admitting: Pulmonary Disease

## 2023-05-18 ENCOUNTER — Ambulatory Visit: Payer: Medicare Other | Admitting: Pulmonary Disease

## 2023-05-18 VITALS — BP 142/80 | HR 71 | Temp 98.1°F | Ht 65.0 in | Wt 128.4 lb

## 2023-05-18 DIAGNOSIS — F17210 Nicotine dependence, cigarettes, uncomplicated: Secondary | ICD-10-CM

## 2023-05-18 DIAGNOSIS — J9611 Chronic respiratory failure with hypoxia: Secondary | ICD-10-CM

## 2023-05-18 DIAGNOSIS — J449 Chronic obstructive pulmonary disease, unspecified: Secondary | ICD-10-CM

## 2023-05-18 DIAGNOSIS — R911 Solitary pulmonary nodule: Secondary | ICD-10-CM | POA: Diagnosis not present

## 2023-05-18 NOTE — Progress Notes (Signed)
Subjective:    Patient ID: Crystal White, female    DOB: 12-21-1940, 82 y.o.   MRN: [id number]  Patient Care Team: Bosie Clos, MD as PCP - General (Family Medicine) Gwyneth Revels, DPM as Referring Physician (Podiatry) Marcina Millard, MD as Consulting Physician (Cardiology) Salena Saner, MD as Consulting Physician (Pulmonary Disease)  Chief Complaint  Patient presents with   Follow-up    DOE. Wheezing. Cough with yellow sputum.    HPI This is an 82 year old current smoker (half PPD) who follows here for the issue of COPD, chronic respiratory failure with hypoxia and dyspnea.  This is a scheduled appointment.  I last saw this patient on 02 February 2023.  At that time she was at baseline. She notes that she is doing well with the [facility] inhaler.  She uses albuterol perhaps only once a week.  Today she presents without having any fevers, chills or sweats since her last visit.  No orthopnea or paroxysmal nocturnal dyspnea.  No lower extremity edema or calf tenderness.  She notes some dyspnea on exertion which is stable and without change in pattern.  He is able to perform her activities of daily living without any major disruption.  Notes occasional wheezing.  Has chronic cough productive of clear to pale yellow sputum.  No hemoptysis.  Cough is usually more present in the mornings.   She has obstructive sleep apnea and is on CPAP at 8 cm of water pressure and oxygen bled in.  Her compliance is impeccable.     Patient presents without using her oxygen which was previously prescribed. She is only using oxygen bled into the CPAP nocturnally.  She did not qualify for POC and this limits her using her oxygen with activity which she clearly needs.   She is enrolled in lung cancer screening, last imaging was performed 01 September 2022 and showed lung RADS 3S due to multiple evanescent nodules, likely related to inflammatory process however need careful follow-up.  She is to have  low-dose chest CT on 06 June 2023.  She is also to have a swallow study due to some issues with dysphagia.  She states that these have been present of longstanding.  She does not endorse any other symptomatology today.   Review of Systems A 10 point review of systems was performed and it is as noted above otherwise negative.   Patient Active Problem List   Diagnosis Date Noted   Wears partial dentures 07/17/2019   Incisional hernia, without obstruction or gangrene 06/03/2016   Osteoporosis 02/22/2016   Musculoskeletal chest pain 07/14/2015   Chronic respiratory failure (HCC) 07/14/2015   Acute bronchitis 07/14/2015   Other emphysema (HCC) 07/14/2015   Chronic renal insufficiency 07/14/2015   Chest pain 07/13/2015   Multiple pulmonary nodules 06/02/2015   Colon polyp 05/29/2015   Hemoptysis 04/29/2015   Sprain of ankle 04/14/2015   Anxiety 04/14/2015   Barrett's esophagus 04/14/2015   Acute exacerbation of chronic obstructive airways disease (HCC) 04/14/2015   Bursitis 04/14/2015   Cellulitis 04/14/2015   Claudication (HCC) 04/14/2015   Cough 04/14/2015   Deficiency, 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   Clinical depression 04/14/2015   Dizziness 04/14/2015   Dyslipidemia 04/14/2015   Essential (primary) hypertension 04/14/2015   Flank pain 04/14/2015   H/O suicide attempt 04/14/2015   Dysphonia 04/14/2015   Hypercholesteremia 04/14/2015   Cannot sleep 04/14/2015   Malaise and fatigue 04/14/2015   Mild cognitive impairment 04/14/2015   Cramp in muscle 04/14/2015  Muscle ache 04/14/2015   Neuropathy 04/14/2015   Adiposity 04/14/2015   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   Candida infection of mouth 04/14/2015   Abnormal loss of weight 04/14/2015   Decreased body weight 04/14/2015   Artificial cardiac pacemaker 10/08/2014   Apnea, sleep 10/08/2014   Presence of cardiac pacemaker 10/08/2014   Hyperlipidemia 10/08/2014   COPD (chronic obstructive pulmonary disease) (HCC)  02/13/2012   Tobacco abuse 02/13/2012   Sinus congestion 02/13/2012   Current tobacco use 02/13/2012   Nasal congestion 02/13/2012   Tobacco use disorder 02/13/2012   Esophagitis, reflux 08/28/2006   Abdominal pain, right lower quadrant 08/22/2005   Acute appendicitis with peritoneal abscess 08/22/2005    Social History   Tobacco Use   Smoking status: Every Day    Packs/day: 1.00    Years: 55.00    Additional pack years: 0.00    Total pack years: 55.00    Types: Cigarettes   Smokeless tobacco: Never   Tobacco comments:    0.5-0.75 PPD 05/18/2023 khj  Substance Use Topics   Alcohol use: Yes    Alcohol/week: 0.0 standard drinks of alcohol    Comment: 1/2 glass wine -rare    Allergies  Allergen Reactions   Iodinated Contrast Media Other (See Comments)    Burning sensation during contrast media injections. Patient states the IV contrast makes her very hot. She has had multiple CT Scans with IV contrast and states she only gets the warm sensation. This is not an allergic reaction, but is noted in her [facility] chart as well. Patton Salles, ARRT R CT, had this discussion with her on 01/10/19 at 1130am.   Bacitracin Swelling and Rash   Statins Other (See Comments) and Rash    Reaction:  Leg cramps  Reaction:  Leg cramps     Current Meds  Medication Sig   acetaminophen (TYLENOL) 500 MG tablet Take 1,000 mg by mouth every 6 (six) hours as needed (pain/headaches.).    albuterol (PROVENTIL HFA) 108 (90 Base) MCG/ACT inhaler Inhale 2 puffs into the lungs every 6 (six) hours as needed for wheezing or shortness of breath.   albuterol (PROVENTIL) (2.5 MG/3ML) 0.083% nebulizer solution Take 3 mLs (2.5 mg total) by nebulization every 4 (four) hours as needed for wheezing or shortness of breath.   Budeson-Glycopyrrol-Formoterol (BREZTRI AEROSPHERE) 160-9-4.8 MCG/ACT AERO Inhale 2 puffs into the lungs in the morning and at bedtime.   citalopram (CELEXA) 10 MG tablet Take 1 tablet (10 mg  total) by mouth at bedtime.   ezetimibe (ZETIA) 10 MG tablet Take 1 tablet (10 mg total) by mouth daily.   gabapentin (NEURONTIN) 100 MG capsule TAKE 2 CAPSULES BY MOUTH 3  TIMES DAILY   hydrochlorothiazide (HYDRODIURIL) 25 MG tablet Take 1 tablet (25 mg total) by mouth daily.   losartan (COZAAR) 100 MG tablet TAKE ONE TABLET BY MOUTH EVERYDAY AT BEDTIME   melatonin 5 MG TABS Take 2.5 mg by mouth at bedtime.   mirtazapine (REMERON) 30 MG tablet Take 1 tablet (30 mg total) by mouth at bedtime.   OXYGEN Inhale into the lungs daily. Uses at night with CPAP and PRN during daytime   pantoprazole (PROTONIX) 40 MG tablet Take 1 tablet (40 mg total) by mouth 2 (two) times daily.   traZODone (DESYREL) 150 MG tablet TAKE ONE TABLET BY MOUTH EVERYDAY AT BEDTIME   tretinoin (RETIN-A) 0.025 % cream Apply topically at bedtime.   TYRVAYA 0.03 MG/ACT SOLN INSTILL 1 SPRAY IN EACH NOSTRIL TWICE DAILY (  APPROXIMATELY 12 HORS APART). DO NOT INHALE THROUGH NOSE. YOU MAY PINCH THE NOSTRILS CLOSED FOR A FEW MOMENTS AFTERWARDS TO PREVENT UNINENTIONAL INHALATION.    Immunization History  Administered Date(s) Administered   Fluad Quad(high Dose 65+) 08/11/2020, 10/12/2021   Influenza Split 09/20/2006, 09/02/2010, 09/10/2012   Influenza Whole 08/28/2012   Influenza, High Dose Seasonal PF 08/28/2014, 08/10/2015, 07/25/2016, 10/03/2017, 08/30/2018, 09/18/2019, 09/01/2022   Influenza-Unspecified 08/28/2013   Moderna Covid-19 Vaccine Bivalent Booster 33yrs & up 09/01/2022   Moderna SARS-COV2 Booster Vaccination 10/12/2020   Moderna Sars-Covid-2 Vaccination 01/09/2020, 02/06/2020, 05/19/2021   Pneumococcal Conjugate-13 08/28/2014   Pneumococcal Polysaccharide-23 03/07/2011   Respiratory Syncytial Virus Vaccine,Recomb Aduvanted(Arexvy) 10/11/2022   Tdap 08/28/2014   Zoster Recombinat (Shingrix) 05/19/2021, 10/11/2022   Zoster, Live 11/04/2012        Objective:     BP (!) 142/80 (BP Location: Left Arm, Cuff Size:  Normal)   Pulse 71   Temp 98.1 F (36.7 C)   Ht 5\' 5"  (1.651 m)   Wt 128 lb 6.4 oz (58.2 kg)   SpO2 91%   BMI 21.37 kg/m   SpO2: 91 % O2 Device: None (Room air)  GENERAL: Well-developed patient elderly woman in no acute distress.  Fully ambulatory, mild to moderate use of accessories of respiration HEAD: Normocephalic, atraumatic.  EYES: Pupils equal, round, reactive to light.  No scleral icterus.  MOUTH: Nose/mouth/throat not examined due to institutional masking requirements.   NECK: Supple. No thyromegaly. Trachea midline. No JVD.  No adenopathy. PULMONARY: Increased AP diameter.  Distant breath sounds, scattered wheezes and rhonchi throughout.   CARDIOVASCULAR: S1 and S2. Regular rate and rhythm.  No rubs, murmurs or gallops heard.   ABDOMEN: Benign. MUSCULOSKELETAL: Kyphosis, no joint deformity, no clubbing, no edema.  NEUROLOGIC: No overt focal deficit.  Speech is fluent. SKIN: Intact,warm,dry.  Limited exam: No rashes. PSYCH: Mood and behavior normal.  Ambulatory oximetry was performed today, she required 3 L of oxygen continuous flow to maintain oxygen between 88 to 90% during ambulation.  This corroborates prior findings.   Assessment & Plan:     ICD-10-CM   1. Stage 3 severe COPD by GOLD classification (HCC)  J44.9    Continue Breztri 2 puffs twice a day Continue as needed albuterol STOP SMOKING    2. Chronic respiratory failure with hypoxia (HCC)  J96.11    Will try to procure more portable source of oxygen for the patient Continue nocturnal oxygen    3. Lung nodule seen on imaging study  R91.1    Has follow-up CT July 2024    4. Tobacco dependence due to cigarettes  F17.210    Patient counseled regarding discontinuation of smoking Total counseling time 3 to 5 minutes      Smoking cessation instruction/counseling given:  counseled patient on the dangers of tobacco use, advised patient to stop smoking, and reviewed strategies to maximize success.  Will  see the patient in follow-up in 3 months time she is to contact us prior to that time should any new difficulties arise.   Gailen Shelter, MD Advanced Bronchoscopy PCCM [facility]    *This note was dictated using voice recognition software/Dragon.  Despite best efforts to proofread, errors can occur which can change the meaning. Any transcriptional errors that result from this process are unintentional and may not be fully corrected at the time of dictation.

## 2023-05-18 NOTE — Patient Instructions (Signed)
I encourage you to have the swallow evaluation.  You required 3 L of oxygen CONTINUOUS flow to be able to maintain proper levels of oxygen.  This is why the tanks you have at home for the oxygen are large.  We tried you on the portable device today but you would not maintain oxygen levels.  Continue taking your Markus Daft twice a day and your albuterol as needed.  We will see you in follow-up in 3 months time.  Call sooner should any new problems arise.

## 2023-05-21 ENCOUNTER — Encounter: Payer: Self-pay | Admitting: Pulmonary Disease

## 2023-05-24 ENCOUNTER — Ambulatory Visit: Payer: Medicare Other | Admitting: Dermatology

## 2023-06-06 ENCOUNTER — Ambulatory Visit
Admission: RE | Admit: 2023-06-06 | Discharge: 2023-06-06 | Disposition: A | Discharge status: Home / Self Care | Payer: Medicare Other | Source: Ambulatory Visit | Attending: Acute Care | Admitting: Acute Care

## 2023-06-06 DIAGNOSIS — R911 Solitary pulmonary nodule: Secondary | ICD-10-CM | POA: Diagnosis not present

## 2023-06-06 DIAGNOSIS — Z87891 Personal history of nicotine dependence: Secondary | ICD-10-CM | POA: Diagnosis present

## 2023-06-07 ENCOUNTER — Telehealth: Payer: Self-pay | Admitting: Pulmonary Disease

## 2023-06-07 ENCOUNTER — Ambulatory Visit
Admission: RE | Admit: 2023-06-07 | Discharge: 2023-06-07 | Disposition: A | Discharge status: Home / Self Care | Payer: Medicare Other | Source: Ambulatory Visit | Attending: Pulmonary Disease | Admitting: Pulmonary Disease

## 2023-06-07 DIAGNOSIS — R1310 Dysphagia, unspecified: Secondary | ICD-10-CM | POA: Diagnosis not present

## 2023-06-07 DIAGNOSIS — R633 Feeding difficulties, unspecified: Secondary | ICD-10-CM | POA: Insufficient documentation

## 2023-06-07 NOTE — Telephone Encounter (Signed)
Pt calling in b/c she albuterol (PROVENTIL) (2.5 MG/3ML) 0.083% nebulizer solution  [facility]

## 2023-06-07 NOTE — Progress Notes (Addendum)
Modified Barium Swallow Study  Patient Details  Name: Crystal White MRN: [id number] Date of Birth: 1941-08-24  Today's Date: 06/07/2023  Modified Barium Swallow completed.  Full report located under Chart Review in the Imaging Section.  History of Present Illness Pt is a 82 y.o. female w/ PMH including: Chronic obstructive pulmonary disease - COPD stage 3, nasal congestion, Fundoplication history at [facility] for Esophageal and Ulcer issues per pt, Tobacco use disorder, Pacemaker   HTN (hypertension), Hyperlipidemia, Sleep apnea, Weight loss,  obstructive chronic bronchitis with exacerbation, peripheral vascular disease.  She continues to smoke and has no real desire to quit. She now has dry macular degeneration followed by ophthalmology.  Pt denied any significant swallowing problems except to endorse mid-sternum discomfort when "I eat beef" and sometimes ofther foods but was not specific. She endorsed episdoes of Regurgitation and difficulty swallowing Pills "sometimes'.  Pt stated this has been ongoing for "years".  Noted weight loss per her PMH.    Chest CT imaging: Numerous new irregular solid pulmonary nodules are seen, likely  infectious. Pulmonary nodules which were new on most recent prior  absent or decreased in size. Short-term follow-up chest CT is  recommended in 3 months to ensure resolution. Lung-RADS 0,  incomplete. Additional lung cancer screening CT images/or comparison  to prior chest CT examinations is needed.  2. Prior fundoplication with unchanged ill-defined tissue at the GE  junction.   3. Coronary artery calcifications, aortic Atherosclerosis      DG Esophagus in 12/2018: The barium tablet got stuck in the distal esophagus consistent  with narrowing. No mass or mucosal alteration is identified in this  region and a benign stricture is favored. Recommend direct  visualization.  2. There is a Zenker's diverticulum identified as described above.  3. Altered peristalsis with delayed  transit of barium through the  esophagus.   Clinical Impression Patient presents with grossly functional oropharyngeal phase swallowing in setting of Esophageal phase Dysmotility noted -- pt has a (known) Zenker's Diverticulum. No aspiration nor laryngeal penetration occurred during this study today. Suspect pt could experience oropharyngeal phase dysmotility/deficits d/t the Esophageal phase Dysmotlity and deficits observed during this study.    Oral stage is characterized by adequate lip closure, bolus preparation and containment, and anterior to posterior transit. Pt appeared to hesitate during transfer of liquid bolus 1x -- also noted full Zenker's Diverticulum in the Esophagus. Post brief hesitation, pt swallowed and cleared. Overall swallow initiation occurs at the level of the valleculae. Pharyngeal stage is noted for adequate tongue base retraction, adequate hyolaryngeal excursion, and adequate pharyngeal constriction. Epiglottic deflection is complete; there is NO penetration nor aspiration during the swallow. There is NO immedicate pharyngeal residue following the swallow but noted min pyriform sinus residue w/ intermittent valleculae residue post building of Esophageal material/Retention and Retrograde flow through the PES. This appeared to occur as bolus texture increased. This pharyngeal residue reduced/cleared w/ f/u, Dry swallows and Time. Pharyngeal stripping wave is complete. Amplitude/duration of cricopharyngeus opening is WFL. There is adequate/complete clearance through the PES.    W/in the Cervical Esophagus, an apparent Zenker's Diverticulum(dx'd per Imaging in 12/2018) was noted which was relayed to the Radiologist. Esophageal Retention of bolus material w/in the pocket/diverticulum was noted w/ Retrograde flow occurring as buildup of material in the diverticulum increased. A 13 mm barium tablet was Not given d/t pt's h/o retention of such in prior DG Esophagus - see chart 12/2018. Min  reduction of the bolus residue w/in the  Diverticulum was noted w/ Time and Dry swallows. Pt reported globus sensation at level of BELOW the thyroid notch -- suspect impact from the bolus-filled Diverticulum. ANY Esophageal phase Dysmotility or Retrograde flow could increase Risk for aspiration of REFLUX material.    Consistencies tested were thin liquids x2 tsps, 2 cup sip, nectar x1 tsp, 2 cup sip, honey x1 tsp, pudding x1 tsp, regular solid (1/2 graham cracker with pudding).    Recommend patient continue a fairly regular diet w/ foods well-cut and moistened; thin liquids; educated pt verbally re: strategies including moistening dry foods, alternating solids and liquids, swallowing twice bites/sips to aid Esophageal clearing. Give Time b/t bites/sips.  No further skilled ST indicated as pt appears at her Baseline per previous Imaging. Eat those foods that are easier and most successful as she has been doing in her diet. Factors that may increase risk of adverse event in presence of aspiration [name] 2021): Respiratory or GI disease (Esophageal phase Dysmotility; Zenker's Diverticulum Baseline)  Swallow Evaluation Recommendations Recommendations: PO diet PO Diet Recommendation: Thin liquids (Level 0);Regular;Dysphagia 3 (Mechanical soft) (chopped meats, moistened foods) Liquid Administration via: Cup Medication Administration: Crushed with puree (speak w/ Pharmacist/PCP re: crushability of Meds) Supervision: Patient able to self-feed Swallowing strategies  : Minimize environmental distractions;Slow rate;Small bites/sips;Multiple dry swallows after each bite/sip;Follow solids with liquids Postural changes: Position pt fully upright for meals;Stay upright 30-60 min after meals (Reflux precs.) Oral care recommendations: Oral care BID (2x/day);Pt independent with oral care Recommended consults: Consider GI consultation; Consider esophageal assessment -- for education/assessment/management of  Zenker's Diverticulum; Consider dietitian consultation        Jerilynn Som, MS, CCC-SLP Speech Language Pathologist Rehab Services; [facility] [place] [phone] (ascom) Grayling Schranz 06/07/2023,4:47 PM

## 2023-06-08 MED ORDER — ALBUTEROL SULFATE (2.5 MG/3ML) 0.083% IN NEBU: 2.5000 mg | INHALATION_SOLUTION | RESPIRATORY_TRACT | 5 refills | Status: AC | PRN

## 2023-06-08 NOTE — Telephone Encounter (Signed)
I have sent in the refill and notified the patient.  Nothing further needed. 

## 2023-06-12 ENCOUNTER — Other Ambulatory Visit: Payer: Self-pay | Admitting: Emergency Medicine

## 2023-06-12 ENCOUNTER — Telehealth: Payer: Self-pay | Admitting: Acute Care

## 2023-06-12 DIAGNOSIS — Z122 Encounter for screening for malignant neoplasm of respiratory organs: Secondary | ICD-10-CM

## 2023-06-12 DIAGNOSIS — F17210 Nicotine dependence, cigarettes, uncomplicated: Secondary | ICD-10-CM

## 2023-06-12 DIAGNOSIS — Z87891 Personal history of nicotine dependence: Secondary | ICD-10-CM

## 2023-06-12 NOTE — Addendum Note (Signed)
Addended by: Sheran Luz on: 06/12/2023 04:56 PM   Modules accepted: Orders

## 2023-06-12 NOTE — Telephone Encounter (Signed)
Called and spoke to patient regarding her results of the LCS scan done on 06/06/2023. Informed her of the results and the recommendation to have a repeat done in 6 months due to the new 7mm nodule. Patient verbalized her understanding and is agreeable to the repeat scan in 6 months. Order placed for repeat scan and results sent to PCP with plan. Nothing further needed at this time.      IMPRESSION: 1. Lung-RADS 3, probably benign findings. Short-term follow-up in 6 months is recommended with repeat low-dose chest CT without contrast (please use the following order, "CT CHEST LCS NODULE FOLLOW-UP W/O CM"). Although the previously described nodules are similar and resolved, there are new and possibly enlarging nodules as detailed above. 2. Development of small bilateral pleural effusions. 3. Aortic atherosclerosis (ICD10-I70.0), coronary artery atherosclerosis and emphysema (ICD10-J43.9). 4. Right adrenal adenoma. 5. Similar soft tissue fullness at the gastroesophageal junction, site of prior fundoplication. This has been evaluated on prior endoscopies.

## 2023-06-13 ENCOUNTER — Other Ambulatory Visit: Payer: Self-pay

## 2023-06-13 DIAGNOSIS — K224 Dyskinesia of esophagus: Secondary | ICD-10-CM

## 2023-08-31 ENCOUNTER — Ambulatory Visit: Payer: Medicare Other | Admitting: Pulmonary Disease

## 2023-09-26 ENCOUNTER — Telehealth: Payer: Self-pay | Admitting: Pulmonary Disease

## 2023-09-26 NOTE — Telephone Encounter (Signed)
Tried to contact the patient. Her voicemail was full and I could not leave a message. I will try again later.

## 2023-09-26 NOTE — Telephone Encounter (Signed)
Patient would like order for portable oxygen concentrator. Patient would like to use [employer]. Patient phone number is [phone].

## 2023-09-27 NOTE — Telephone Encounter (Signed)
Per Dr. Georgann Housekeeper note from 6/20- You required 3 L of oxygen CONTINUOUS flow to be able to maintain proper levels of oxygen. This is why the tanks you have at home for the oxygen are large. We tried you on the portable device today but you would not maintain oxygen levels.   I spoke with the patient and told her [employer] only has pulse portable oxygen concentrators and she needs continuous oxygen. I told her insurance will not pay for the [employer] because she did not qualify for a pulse portable O2 concentrator.  Nothing further needed.

## 2023-10-31 ENCOUNTER — Other Ambulatory Visit: Payer: Self-pay | Admitting: *Deleted

## 2023-10-31 DIAGNOSIS — R911 Solitary pulmonary nodule: Secondary | ICD-10-CM

## 2023-12-04 ENCOUNTER — Ambulatory Visit: Payer: Medicare Other | Admitting: Pulmonary Disease

## 2023-12-12 ENCOUNTER — Ambulatory Visit
Admission: RE | Admit: 2023-12-12 | Discharge: 2023-12-12 | Disposition: A | Discharge status: Home / Self Care | Payer: Medicare (Managed Care) | Source: Ambulatory Visit | Attending: Acute Care | Admitting: Acute Care

## 2023-12-12 DIAGNOSIS — R911 Solitary pulmonary nodule: Secondary | ICD-10-CM | POA: Insufficient documentation

## 2023-12-26 ENCOUNTER — Ambulatory Visit: Payer: Medicare (Managed Care) | Admitting: Pulmonary Disease

## 2023-12-26 ENCOUNTER — Encounter: Payer: Self-pay | Admitting: Pulmonary Disease

## 2023-12-26 VITALS — BP 120/70 | HR 80 | Temp 98.8°F | Ht 63.0 in | Wt 114.8 lb

## 2023-12-26 DIAGNOSIS — G4733 Obstructive sleep apnea (adult) (pediatric): Secondary | ICD-10-CM

## 2023-12-26 DIAGNOSIS — F17210 Nicotine dependence, cigarettes, uncomplicated: Secondary | ICD-10-CM

## 2023-12-26 DIAGNOSIS — J449 Chronic obstructive pulmonary disease, unspecified: Secondary | ICD-10-CM | POA: Diagnosis not present

## 2023-12-26 DIAGNOSIS — J9611 Chronic respiratory failure with hypoxia: Secondary | ICD-10-CM | POA: Diagnosis not present

## 2023-12-26 NOTE — Patient Instructions (Signed)
VISIT SUMMARY:  Today, we discussed your ongoing management of COPD and emphysema, your stable lung nodules, and your experience with hot flashes. We also talked about the need for a portable oxygen device and reviewed your use of the CPAP machine for sleep apnea.  YOUR PLAN:  -CHRONIC OBSTRUCTIVE PULMONARY DISEASE (COPD): COPD is a chronic lung condition that makes it hard to breathe. We will conduct a six-minute walk test to see if you qualify for a portable oxygen concentrator. Please continue using your CPAP machine at night and try to reduce smoking. We will also download your CPAP data to monitor your sleep apnea.  -LUNG NODULES: Lung nodules are small growths in the lungs. Your recent scan shows that they are stable. We will continue to monitor them with follow-up imaging as needed.  -GENERAL HEALTH MAINTENANCE: You are experiencing hot flashes, which are sudden feelings of warmth. We will monitor these symptoms and consider further evaluation if they persist or worsen.  INSTRUCTIONS:  Please schedule a follow-up appointment in three months. We will also conduct a six-minute walk test to evaluate your need for a portable oxygen concentrator and download your CPAP data via Airview.

## 2023-12-26 NOTE — Progress Notes (Signed)
Subjective:    Patient ID: Crystal White, female    DOB: 1941/10/15, 83 y.o.   MRN: [id number]  Patient Care Team: Bosie Clos, MD as PCP - General (Family Medicine) Gwyneth Revels, DPM as Referring Physician (Podiatry) Marcina Millard, MD as Consulting Physician (Cardiology) Salena Saner, MD as Consulting Physician (Pulmonary Disease)  Chief Complaint  Patient presents with   Follow-up    BACKGROUND/INTERVAL:This is an 83 year old current smoker (half PPD) who follows here for the issue of COPD, obstructive sleep apnea with nocturnal hypoxemia and dyspnea.  She also follows for lung nodules.  This is a scheduled appointment.  I last saw this patient on 18 May 2023.    HPI Discussed the use of AI scribe software for clinical note transcription with the patient, who gave verbal consent to proceed.  History of Present Illness   The patient, with COPD and lung nodules, presents for follow-up. She is accompanied by her son.  She has a history of COPD and emphysema, which significantly impacts her daily life. She stays home most of the time due to difficulty breathing and wants a portable oxygen device to assist her when going out, such as to the grocery store. She smokes about a pack a day, sometimes less, and acknowledges the weather affects her symptoms.  Previously the patient had not met criteria for supplemental oxygen during ambulation.She uses a nighttime oxygen concentrator to bleed in O2 through her CPAP but finds herself using it more during the day.  She had been qualified for portable O2 at her visit of 02 February 2023 noting that the patient needed 2 L of oxygen bled in with CPAP and at that time required 3 L of oxygen to maintain oxygen saturations of 89% better.  The patient however at the time did not qualify for a POC and declined portable sources due to weight.  Her last CT chest performed on 12 December 2023 showed stable lung nodules.  She has aged out of  the lung cancer screening program.  These will be followed expectantly as they have been stable.  She continues to use a CPAP machine for sleep apnea. She has difficulty sleeping without it and mentions an incident where the machine was unplugged, but it is now functioning after reconnecting the cord. She did not bring the CPAP card for data download as previously requested.      Review of Systems A 10 point review of systems was performed and it is as noted above otherwise negative.   Patient Active Problem List   Diagnosis Date Noted   Wears partial dentures 07/17/2019   Incisional hernia, without obstruction or gangrene 06/03/2016   Osteoporosis 02/22/2016   Musculoskeletal chest pain 07/14/2015   Chronic respiratory failure (HCC) 07/14/2015   Acute bronchitis 07/14/2015   Other emphysema (HCC) 07/14/2015   Chronic renal insufficiency 07/14/2015   Chest pain 07/13/2015   Multiple pulmonary nodules 06/02/2015   Colon polyp 05/29/2015   Hemoptysis 04/29/2015   Sprain of ankle 04/14/2015   Anxiety 04/14/2015   Barrett's esophagus 04/14/2015   Acute exacerbation of chronic obstructive airways disease (HCC) 04/14/2015   Bursitis 04/14/2015   Cellulitis 04/14/2015   Claudication (HCC) 04/14/2015   Cough 04/14/2015   Deficiency, 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   Clinical depression 04/14/2015   Dizziness 04/14/2015   Dyslipidemia 04/14/2015   Essential (primary) hypertension 04/14/2015   Flank pain 04/14/2015   H/O suicide attempt 04/14/2015   Dysphonia 04/14/2015   Hypercholesteremia 04/14/2015  Cannot sleep 04/14/2015   Malaise and fatigue 04/14/2015   Mild cognitive impairment 04/14/2015   Cramp in muscle 04/14/2015   Muscle ache 04/14/2015   Neuropathy 04/14/2015   Adiposity 04/14/2015   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   Candida infection of mouth 04/14/2015   Abnormal loss of weight 04/14/2015   Decreased body weight 04/14/2015   Artificial cardiac pacemaker  10/08/2014   Apnea, sleep 10/08/2014   Presence of cardiac pacemaker 10/08/2014   Hyperlipidemia 10/08/2014   COPD (chronic obstructive pulmonary disease) (HCC) 02/13/2012   Tobacco abuse 02/13/2012   Sinus congestion 02/13/2012   Current tobacco use 02/13/2012   Nasal congestion 02/13/2012   Tobacco use disorder 02/13/2012   Esophagitis, reflux 08/28/2006   Abdominal pain, right lower quadrant 08/22/2005   Acute appendicitis with peritoneal abscess 08/22/2005    Social History   Tobacco Use   Smoking status: Every Day    Current packs/day: 1.00    Average packs/day: 1 pack/day for 55.0 years (55.0 ttl pk-yrs)    Types: Cigarettes   Smokeless tobacco: Never   Tobacco comments:    Smoking 1 ppd, not trying to stop.  12/26/2023 hfb RN  Substance Use Topics   Alcohol use: Yes    Alcohol/week: 0.0 standard drinks of alcohol    Comment: 1/2 glass wine -rare    Allergies  Allergen Reactions   Iodinated Contrast Media Other (See Comments)    Burning sensation during contrast media injections. Patient states the IV contrast makes her very hot. She has had multiple CT Scans with IV contrast and states she only gets the warm sensation. This is not an allergic reaction, but is noted in her [facility] chart as well. Patton Salles, ARRT R CT, had this discussion with her on 01/10/19 at 1130am.   Bacitracin Swelling and Rash   Statins Other (See Comments) and Rash    Reaction:  Leg cramps  Reaction:  Leg cramps     Current Meds  Medication Sig   acetaminophen (TYLENOL) 500 MG tablet Take 1,000 mg by mouth every 6 (six) hours as needed (pain/headaches.).    albuterol (PROVENTIL HFA) 108 (90 Base) MCG/ACT inhaler Inhale 2 puffs into the lungs every 6 (six) hours as needed for wheezing or shortness of breath.   albuterol (PROVENTIL) (2.5 MG/3ML) 0.083% nebulizer solution Take 3 mLs (2.5 mg total) by nebulization every 4 (four) hours as needed for wheezing or shortness of breath.    Budeson-Glycopyrrol-Formoterol (BREZTRI AEROSPHERE) 160-9-4.8 MCG/ACT AERO Inhale 2 puffs into the lungs in the morning and at bedtime.   citalopram (CELEXA) 10 MG tablet Take 1 tablet (10 mg total) by mouth at bedtime.   ezetimibe (ZETIA) 10 MG tablet Take 1 tablet (10 mg total) by mouth daily.   gabapentin (NEURONTIN) 100 MG capsule TAKE 2 CAPSULES BY MOUTH 3  TIMES DAILY   hydrochlorothiazide (HYDRODIURIL) 25 MG tablet Take 1 tablet (25 mg total) by mouth daily.   losartan (COZAAR) 100 MG tablet TAKE ONE TABLET BY MOUTH EVERYDAY AT BEDTIME   melatonin 5 MG TABS Take 2.5 mg by mouth at bedtime.   mirtazapine (REMERON) 30 MG tablet Take 1 tablet (30 mg total) by mouth at bedtime.   OXYGEN Inhale into the lungs daily. Uses at night with CPAP and PRN during daytime   pantoprazole (PROTONIX) 40 MG tablet Take 1 tablet (40 mg total) by mouth 2 (two) times daily.   TYRVAYA 0.03 MG/ACT SOLN INSTILL 1 SPRAY IN EACH NOSTRIL TWICE DAILY (APPROXIMATELY  12 HORS APART). DO NOT INHALE THROUGH NOSE. YOU MAY PINCH THE NOSTRILS CLOSED FOR A FEW MOMENTS AFTERWARDS TO PREVENT UNINENTIONAL INHALATION.    Immunization History  Administered Date(s) Administered   Fluad Quad(high Dose 65+) 08/11/2020, 10/12/2021, 10/25/2023   Influenza Split 09/20/2006, 09/02/2010, 09/10/2012   Influenza Whole 08/28/2012   Influenza, High Dose Seasonal PF 08/28/2014, 08/10/2015, 07/25/2016, 10/03/2017, 08/30/2018, 09/18/2019, 09/01/2022   Influenza-Unspecified 08/28/2013   Moderna Covid-19 Vaccine Bivalent Booster 56yrs & up 09/01/2022   Moderna SARS-COV2 Booster Vaccination 10/12/2020, 10/25/2023   Moderna Sars-Covid-2 Vaccination 01/09/2020, 02/06/2020, 05/19/2021   Pneumococcal Conjugate-13 08/28/2014   Pneumococcal Polysaccharide-23 03/07/2011   Respiratory Syncytial Virus Vaccine,Recomb Aduvanted(Arexvy) 10/11/2022   Tdap 08/28/2014   Zoster Recombinant(Shingrix) 05/19/2021, 10/11/2022   Zoster, Live 11/04/2012         Objective:     BP 120/70 (BP Location: Right Arm, Patient Position: Sitting, Cuff Size: Normal)   Pulse 80   Temp 98.8 F (37.1 C) (Oral)   Ht 5\' 3"  (1.6 m)   Wt 114 lb 12.8 oz (52.1 kg)   SpO2 99%   BMI 20.34 kg/m   SpO2: 99 % O2 Device: None (Room air)  GENERAL: Well-developed patient elderly woman in no acute distress.  Fully ambulatory, mild to moderate use of accessories of respiration HEAD: Normocephalic, atraumatic.  EYES: Pupils equal, round, reactive to light.  No scleral icterus.  MOUTH: Nose/mouth/throat not examined due to institutional masking requirements.   NECK: Supple. No thyromegaly. Trachea midline. No JVD.  No adenopathy. PULMONARY: Increased AP diameter.  Distant breath sounds, scattered wheezes and rhonchi throughout.   CARDIOVASCULAR: S1 and S2. Regular rate and rhythm.  No rubs, murmurs or gallops heard.   ABDOMEN: Benign. MUSCULOSKELETAL: Kyphosis, no joint deformity, no clubbing, no edema.  NEUROLOGIC: No overt focal deficit.  Speech is fluent. SKIN: Intact,warm,dry.  Limited exam: No rashes. PSYCH: Mood and behavior normal.  Ambulatory oxymetry was performed today:  At rest on room air oxygen saturation was 91%, the patient ambulated at a moderate pace, completed 3 laps, O2 nadir 87%, severe shortness of breath.  Resting heart rate was 75 bpm at maximum for this exercise 100 bpm. Patient was then ambulated on 2 L/min nasal cannula O2 and was able to maintain oxygen saturations at 90% or better.  She was then placed on POC with on demand valve and was able to maintain oxygen saturations at 90% or better.    Assessment & Plan:     ICD-10-CM   1. Stage 3 severe COPD by GOLD classification (HCC)  J44.9 Ambulatory Referral for DME    2. Chronic respiratory failure with hypoxia (HCC)  J96.11     3. Obstructive sleep apnea syndrome  G47.33     4. Tobacco dependence due to cigarettes  F17.210      Orders Placed This Encounter  Procedures    Ambulatory Referral for DME    Referral Priority:   Routine    Referral Type:   Durable Medical Equipment Purchase    Number of Visits Requested:   1   Discussion:    Chronic Obstructive Pulmonary Disease (COPD) COPD with emphysema. Reports dyspnea on exertion and uses CPAP at night. Oxygen saturation drops to 87% without supplemental oxygen during exertion but remains stable with oxygen. Continues to smoke one pack per day. Discussed six-minute walk test for oxygen desaturation and potential qualification for a portable oxygen concentrator (POC). Patient willing to pay for POC if not covered by Medicare. -  Evaluate for portable oxygen concentrator (POC) -patient qualified - Download CPAP data via Airview - Encourage smoking cessation, counseling provided today  Lung Nodules Recent scan shows well-managed lung nodules. No new findings. - Routine monitoring with follow-up imaging as needed - Patient has aged out of lung cancer screening  General Health Maintenance Tobacco cessation counseling provided. -Total counseling time 3 to 5 minutes  Follow-up - Schedule follow-up appointment in three months.     Advised if symptoms do not improve or worsen, to please contact office for sooner follow up or seek emergency care.    I spent 61 minutes of dedicated to the care of this patient on the date of this encounter to include pre-visit review of records, face-to-face time with the patient discussing conditions above, post visit ordering of testing, clinical documentation with the electronic health record, making appropriate referrals as documented, and communicating necessary findings to members of the patients care team.     C. Danice Goltz, MD Advanced Bronchoscopy PCCM [facility]    *This note was generated using voice recognition software/Dragon and/or AI transcription program.  Despite best efforts to proofread, errors can occur which can change the meaning. Any  transcriptional errors that result from this process are unintentional and may not be fully corrected at the time of dictation.

## 2024-01-01 ENCOUNTER — Telehealth: Payer: Self-pay | Admitting: Pulmonary Disease

## 2024-01-01 NOTE — Telephone Encounter (Signed)
Patient needs oxygen prescription to be sent to [employer] fax number [phone]

## 2024-01-02 NOTE — Telephone Encounter (Signed)
 The 6-minute walk was going to be done if she did not qualify during the visit.  However, under ambulatory oximetry which is in the same area as the physical exam is it is clearly stated that the patient qualifies for supplemental oxygen .  She was tested with both oxygen  tank and POC during the visit.  That is all documented on the ambulatory oximetry.

## 2024-01-02 NOTE — Telephone Encounter (Signed)
Crystal White with [employer] states they will need the 6 minute walk mentioned in the note on 12/26/23 but no 6 minute walk was ordered just mentioned

## 2024-01-02 NOTE — Telephone Encounter (Signed)
Can we send the last O2 order from 1/28 to [employer] or will they need another one?

## 2024-01-03 NOTE — Telephone Encounter (Signed)
 I have sent Dr. Viva Grise response to [employer]

## 2024-01-08 NOTE — Telephone Encounter (Signed)
 Spoke with Mitch at [employer] he has reached out to [place] office to get this handled

## 2024-01-11 ENCOUNTER — Telehealth: Payer: Self-pay | Admitting: Pulmonary Disease

## 2024-01-11 NOTE — Telephone Encounter (Signed)
Synetta Fail, can you help with this? Please refer to 2/3 phone note.

## 2024-01-11 NOTE — Telephone Encounter (Signed)
Please see last signed encounter. PT is still waiting on 02. Her # is [phone]. Please call PT to advise status.

## 2024-01-11 NOTE — Telephone Encounter (Signed)
PT calling again about where is her 02. Adv. We are working on it. Pls call her @ [phone]

## 2024-01-12 NOTE — Telephone Encounter (Signed)
I have spoke with Crystal White with [employer] and he states he thinks they have everything they need for this patient to get her POC. She currently  has 02 with them and is sending a message to the [place] location to get the patient her POC

## 2024-01-25 ENCOUNTER — Telehealth: Payer: Self-pay | Admitting: Pulmonary Disease

## 2024-01-25 NOTE — Telephone Encounter (Signed)
 An extension of last encounter:  Patient called [employer] about an hour ago and they still do not have the order for her POC. They told her that they would call her back. Patient is unsure as what to do without her oxygen. Patient can be reached at [phone]

## 2024-01-25 NOTE — Telephone Encounter (Signed)
 I called and verified with Crystal White that she does have her 67 with [employer] and she states is fit to be tied. I then called Mitch with [employer] about this same issue that has been going on since 12/26/23 and I had already spoke with him on 01/12/24

## 2024-01-26 NOTE — Telephone Encounter (Signed)
 Mitch with [employer] called me back and stated there is shortage on POCs and he would have the [place] location to call the patient to see if she would go to [facility] to pick up the POC

## 2024-03-13 NOTE — Telephone Encounter (Signed)
 I called Crystal White just to see if she finally got her POC. She stated yes she did get her POC that is why we hadn't heard from her

## 2024-04-04 ENCOUNTER — Encounter: Payer: Self-pay | Admitting: Pulmonary Disease

## 2024-04-04 ENCOUNTER — Ambulatory Visit: Payer: Medicare (Managed Care) | Admitting: Pulmonary Disease

## 2024-04-04 VITALS — BP 126/70 | HR 80 | Temp 97.7°F | Ht 63.0 in | Wt 115.2 lb

## 2024-04-04 DIAGNOSIS — J9611 Chronic respiratory failure with hypoxia: Secondary | ICD-10-CM

## 2024-04-04 DIAGNOSIS — R911 Solitary pulmonary nodule: Secondary | ICD-10-CM | POA: Diagnosis not present

## 2024-04-04 DIAGNOSIS — J4489 Other specified chronic obstructive pulmonary disease: Secondary | ICD-10-CM

## 2024-04-04 DIAGNOSIS — G4733 Obstructive sleep apnea (adult) (pediatric): Secondary | ICD-10-CM | POA: Diagnosis not present

## 2024-04-04 DIAGNOSIS — J449 Chronic obstructive pulmonary disease, unspecified: Secondary | ICD-10-CM

## 2024-04-04 DIAGNOSIS — F17210 Nicotine dependence, cigarettes, uncomplicated: Secondary | ICD-10-CM

## 2024-04-04 NOTE — Patient Instructions (Signed)
 VISIT SUMMARY:  Today, you had a follow-up appointment to discuss your COPD and other health concerns. We reviewed your current treatment plan, including your inhaler regimen and the use of your portable oxygen  pump. We also discussed your back pain and strategies for managing it.  YOUR PLAN:  -CHRONIC OBSTRUCTIVE PULMONARY DISEASE (COPD): COPD is a chronic lung condition that makes it hard to breathe. Your current treatment with Breztri  is effective, and you are using your portable oxygen  pump successfully. You have used your rescue inhaler a couple of times in the past week, likely due to allergies. It is important to continue your current inhaler regimen and work towards quitting smoking. We will also schedule a follow-up CT scan in July to monitor your condition.  -OBSTRUCTIVE SLEEP APNEA: You have been very compliant with your CPAP.  Continue CPAP use.  -LUNG NODULE: You will be due for follow-up CT towards the end of July to beginning part of August.  This will be scheduled.  -MUSCLE SPASM: Muscle spasms are sudden, involuntary muscle contractions that can cause pain. Your back pain may be due to muscle tension or other causes like arthritis or a compression fracture. You should continue using Tylenol  and stretching exercises for relief. If the pain persists, please contact your primary care provider for further evaluation and possible imaging.   INSTRUCTIONS:  Please continue with your current inhaler regimen and use your portable oxygen  pump as needed. Try to reduce and eventually quit smoking. If your back pain continues, contact your primary care provider for further evaluation. We will schedule a follow-up CT scan in July/August to monitor your lung nodule.

## 2024-04-04 NOTE — Progress Notes (Signed)
 Subjective:    Patient ID: Crystal White, female    DOB: 06/18/1941, 83 y.o.   MRN: [id number]  Patient Care Team: Nikki Barters, MD as PCP - General (Family Medicine) Anell Baptist, DPM as Referring Physician (Podiatry) Percival Brace, MD as Consulting Physician (Cardiology) Marc Senior, MD as Consulting Physician (Pulmonary Disease)  Chief Complaint  Patient presents with   Follow-up    Cough with clear phlegm. Cough worse at night. Shortness of breath on exertion. Occasional wheezing.     BACKGROUND/INTERVAL:This is an 83 year old current smoker (half PPD) who follows here for the issue of COPD, obstructive sleep apnea with nocturnal hypoxemia and dyspnea.  She also follows for lung nodules.  This is a scheduled appointment.  I last saw this patient on 26 December 2023.    HPI Discussed the use of AI scribe software for clinical note transcription with the patient, who gave verbal consent to proceed.  History of Present Illness   Crystal White is an 83 year old female with COPD who presents for follow-up of her condition.  She has recently received a portable oxygen  concentrator, which is functioning well. Her current inhaler regimen includes Breztri , which remains effective. She has used her rescue inhaler a couple of times in the past week, primarily when walking around the house. Some respiratory symptoms are attributed to seasonal allergies and the presence of her dogs, a Cocker Spaniel and a Rottweiler, who bring allergens into her bedroom.  She continues to smoke about half a pack of cigarettes per day and wants to quit smoking, although she has not yet made any attempts to do so.  We discussed strategies today about achieving smoking cessation.  She experiences pain across her back, which she wonders might be related to her lungs. The pain is described as being across the back, and she manages it with Tylenol  and stretching.  On further discussion it  appears that this is muscular skeletal in nature.  Her last chest CT was performed 12 December 2023 which showed multiple lung nodules that remain either stable or decreased from prior.  She also had a stable right adrenal adenoma.  She has now aged out of of the lung cancer screening program.  Will continue to follow this expectantly.  Compliance download for her CPAP shows 100% compliance with 98% being days over 4 hours and an average AHI of 1.    Review of Systems A 10 point review of systems was performed and it is as noted above otherwise negative.   Patient Active Problem List   Diagnosis Date Noted   Wears partial dentures 07/17/2019   Incisional hernia, without obstruction or gangrene 06/03/2016   Osteoporosis 02/22/2016   Musculoskeletal chest pain 07/14/2015   Chronic respiratory failure (HCC) 07/14/2015   Acute bronchitis 07/14/2015   Other emphysema (HCC) 07/14/2015   Chronic renal insufficiency 07/14/2015   Chest pain 07/13/2015   Multiple pulmonary nodules 06/02/2015   Colon polyp 05/29/2015   Hemoptysis 04/29/2015   Sprain of ankle 04/14/2015   Anxiety 04/14/2015   Barrett's esophagus 04/14/2015   Acute exacerbation of chronic obstructive airways disease (HCC) 04/14/2015   Bursitis 04/14/2015   Cellulitis 04/14/2015   Claudication (HCC) 04/14/2015   Cough 04/14/2015   Deficiency, 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   Clinical depression 04/14/2015   Dizziness 04/14/2015   Dyslipidemia 04/14/2015   Essential (primary) hypertension 04/14/2015   Flank pain 04/14/2015   H/O suicide attempt 04/14/2015   Dysphonia 04/14/2015  Hypercholesteremia 04/14/2015   Cannot sleep 04/14/2015   Malaise and fatigue 04/14/2015   Mild cognitive impairment 04/14/2015   Cramp in muscle 04/14/2015   Muscle ache 04/14/2015   Neuropathy 04/14/2015   Adiposity 04/14/2015   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   Candida infection of mouth 04/14/2015   Abnormal loss of weight 04/14/2015    Decreased body weight 04/14/2015   Artificial cardiac pacemaker 10/08/2014   Apnea, sleep 10/08/2014   Presence of cardiac pacemaker 10/08/2014   Hyperlipidemia 10/08/2014   COPD (chronic obstructive pulmonary disease) (HCC) 02/13/2012   Tobacco abuse 02/13/2012   Sinus congestion 02/13/2012   Current tobacco use 02/13/2012   Nasal congestion 02/13/2012   Tobacco use disorder 02/13/2012   Esophagitis, reflux 08/28/2006   Abdominal pain, right lower quadrant 08/22/2005   Acute appendicitis with peritoneal abscess 08/22/2005    Social History   Tobacco Use   Smoking status: Every Day    Current packs/day: 1.00    Average packs/day: 1 pack/day for 55.0 years (55.0 ttl pk-yrs)    Types: Cigarettes   Smokeless tobacco: Never   Tobacco comments:    Smoking 1 ppd, not trying to stop.  12/26/2023 hfb RN  Substance Use Topics   Alcohol use: Yes    Alcohol/week: 0.0 standard drinks of alcohol    Comment: 1/2 glass wine -rare    Allergies  Allergen Reactions   Iodinated Contrast Media Other (See Comments)    Burning sensation during contrast media injections. Patient states the IV contrast makes her very hot. She has had multiple CT Scans with IV contrast and states she only gets the warm sensation. This is not an allergic reaction, but is noted in her [facility] chart as well. Arthuro Billow, ARRT R CT, had this discussion with her on 01/10/19 at 1130am.   Bacitracin  Swelling and Rash   Statins Other (See Comments) and Rash    Reaction:  Leg cramps  Reaction:  Leg cramps     Current Meds  Medication Sig   acetaminophen  (TYLENOL ) 500 MG tablet Take 1,000 mg by mouth every 6 (six) hours as needed (pain/headaches.).    albuterol  (PROVENTIL  HFA) 108 (90 Base) MCG/ACT inhaler Inhale 2 puffs into the lungs every 6 (six) hours as needed for wheezing or shortness of breath.   Budeson-Glycopyrrol-Formoterol  (BREZTRI  AEROSPHERE) 160-9-4.8 MCG/ACT AERO Inhale 2 puffs into the lungs in the  morning and at bedtime.   hydrochlorothiazide  (HYDRODIURIL ) 25 MG tablet Take 1 tablet (25 mg total) by mouth daily.   LORazepam (ATIVAN) 0.5 MG tablet Take 0.5 mg by mouth as needed for anxiety.   losartan  (COZAAR ) 100 MG tablet TAKE ONE TABLET BY MOUTH EVERYDAY AT BEDTIME   melatonin 5 MG TABS Take 2.5 mg by mouth at bedtime.   mirtazapine  (REMERON ) 30 MG tablet Take 1 tablet (30 mg total) by mouth at bedtime.   OXYGEN  Inhale into the lungs daily. Uses at night with CPAP and PRN during daytime   pantoprazole  (PROTONIX ) 40 MG tablet Take 1 tablet (40 mg total) by mouth 2 (two) times daily.   TYRVAYA  0.03 MG/ACT SOLN INSTILL 1 SPRAY IN EACH NOSTRIL TWICE DAILY (APPROXIMATELY 12 HORS APART). DO NOT INHALE THROUGH NOSE. YOU MAY PINCH THE NOSTRILS CLOSED FOR A FEW MOMENTS AFTERWARDS TO PREVENT UNINENTIONAL INHALATION.    Immunization History  Administered Date(s) Administered   Fluad Quad(high Dose 65+) 08/11/2020, 10/12/2021, 10/25/2023   Influenza Split 09/20/2006, 09/02/2010, 09/10/2012   Influenza Whole 08/28/2012   Influenza, High Dose  Seasonal PF 08/28/2014, 08/10/2015, 07/25/2016, 10/03/2017, 08/30/2018, 09/18/2019, 09/01/2022   Influenza-Unspecified 08/28/2013   Moderna Covid-19 Fall Seasonal Vaccine 35yrs & older 09/12/2023   Moderna Covid-19 Vaccine Bivalent Booster 34yrs & up 09/01/2022   Moderna SARS-COV2 Booster Vaccination 10/12/2020, 10/25/2023   Moderna Sars-Covid-2 Vaccination 01/09/2020, 02/06/2020, 05/19/2021   Pneumococcal Conjugate-13 08/28/2014   Pneumococcal Polysaccharide-23 03/07/2011   Respiratory Syncytial Virus Vaccine,Recomb Aduvanted(Arexvy) 10/11/2022   Tdap 08/28/2014   Zoster Recombinant(Shingrix) 05/19/2021, 10/11/2022   Zoster, Live 11/04/2012        Objective:     BP 126/70 (BP Location: Left Arm, Patient Position: Sitting, Cuff Size: Normal)   Pulse 80   Temp 97.7 F (36.5 C) (Temporal)   Ht 5\' 3"  (1.6 m)   Wt 115 lb 3.2 oz (52.3 kg)   SpO2  95% Comment: 2L pulse oxygen   BMI 20.41 kg/m   SpO2: 95 % (2L pulse oxygen ) O2 Device: Nasal cannula O2 Flow Rate (L/min): 2 L/min O2 Type: Pulse O2  GENERAL: Well-developed patient elderly woman in no acute distress.  Fully ambulatory, mild to moderate use of accessories of respiration HEAD: Normocephalic, atraumatic.  EYES: Pupils equal, round, reactive to light.  No scleral icterus.  MOUTH: Nose/mouth/throat not examined due to institutional masking requirements.   NECK: Supple. No thyromegaly. Trachea midline. No JVD.  No adenopathy. PULMONARY: Increased AP diameter.  Distant breath sounds, scattered wheezes and rhonchi throughout.   CARDIOVASCULAR: S1 and S2. Regular rate and rhythm.  No rubs, murmurs or gallops heard.   ABDOMEN: Benign. MUSCULOSKELETAL: Kyphosis, no joint deformity, no clubbing, no edema.  NEUROLOGIC: No overt focal deficit.  Speech is fluent. SKIN: Intact,warm,dry.  Limited exam: No rashes. PSYCH: Mood and behavior normal.       Assessment & Plan:     ICD-10-CM   1. Stage 3 severe COPD by GOLD classification (HCC)  J44.9     2. Chronic respiratory failure with hypoxia (HCC)  J96.11     3. Obstructive sleep apnea syndrome  G47.33     4. Tobacco dependence due to cigarettes  F17.210      Discussion:    Chronic Obstructive Pulmonary Disease (COPD) COPD is well-managed with current treatment. She reports using the rescue inhaler only a couple of times in the past week, likely due to increased allergies. Continues to smoke approximately half a pack per day and expresses a desire to quit. Current inhaler regimen with Breztri  is effective. She has successfully obtained and is using a portable oxygen  pump, which is functioning well. - Continue current inhaler regimen with Breztri . - Encourage smoking cessation. - Order follow-up CT scan in July for ongoing monitoring.  Muscle spasm Muscle spasm across the back, possibly related to muscle tension or a  compression fracture. Differential diagnosis includes arthritis or compression fracture. Advised to monitor symptoms and consult primary care if pain persists. If severe, primary care may consider imaging or interventions such as cement injection for compression fracture. - Advise stretching exercises and use of acetaminophen  for pain relief. - Instruct to contact primary care provider if pain persists for further evaluation and possible imaging.     Advised if symptoms do not improve or worsen, to please contact office for sooner follow up or seek emergency care.    I spent 30 minutes of dedicated to the care of this patient on the date of this encounter to include pre-visit review of records, face-to-face time with the patient discussing conditions above, post visit ordering of testing, clinical  documentation with the electronic health record, making appropriate referrals as documented, and communicating necessary findings to members of the patients care team.     C. Chloe Counter, MD Advanced Bronchoscopy PCCM [facility]    *This note was generated using voice recognition software/Dragon and/or AI transcription program.  Despite best efforts to proofread, errors can occur which can change the meaning. Any transcriptional errors that result from this process are unintentional and may not be fully corrected at the time of dictation.

## 2024-08-07 ENCOUNTER — Ambulatory Visit: Payer: Medicare (Managed Care) | Admitting: Pulmonary Disease

## 2024-08-07 ENCOUNTER — Encounter: Payer: Self-pay | Admitting: Pulmonary Disease

## 2024-08-07 VITALS — BP 140/84 | HR 63 | Temp 97.6°F | Ht 63.0 in | Wt 108.2 lb

## 2024-08-07 DIAGNOSIS — J449 Chronic obstructive pulmonary disease, unspecified: Secondary | ICD-10-CM | POA: Diagnosis not present

## 2024-08-07 DIAGNOSIS — F17210 Nicotine dependence, cigarettes, uncomplicated: Secondary | ICD-10-CM

## 2024-08-07 DIAGNOSIS — Z23 Encounter for immunization: Secondary | ICD-10-CM

## 2024-08-07 DIAGNOSIS — J9611 Chronic respiratory failure with hypoxia: Secondary | ICD-10-CM

## 2024-08-07 DIAGNOSIS — R911 Solitary pulmonary nodule: Secondary | ICD-10-CM | POA: Diagnosis not present

## 2024-08-07 NOTE — Patient Instructions (Signed)
 VISIT SUMMARY:  Today, you came in for a follow-up visit to discuss your severe COPD, chronic respiratory failure with hypoxia, and obstructive sleep apnea. We also reviewed your recent falls and the resulting rib contusion.  YOUR PLAN:  -SEVERE CHRONIC OBSTRUCTIVE PULMONARY DISEASE WITH CHRONIC RESPIRATORY FAILURE AND HYPOXIA: Chronic obstructive pulmonary disease (COPD) is a long-term lung condition that makes it hard to breathe. Chronic respiratory failure with hypoxia means your lungs are not getting enough oxygen  into your blood. Continue taking Breztri  as prescribed to help manage your symptoms. You also received a flu shot today to help prevent respiratory infections.  -OBSTRUCTIVE SLEEP APNEA: Obstructive sleep apnea is a condition where your breathing stops and starts during sleep. You are managing this with a CPAP machine. We will contact [employer] to check if you are eligible for a new CPAP machine, as your current one may not be transmitting data effectively.  -RIB CONTUSION, IMPROVED: A rib contusion is a bruise on your rib, usually caused by a fall or impact. Your rib contusion is improving, but you should continue to monitor the area for any changes or persistent pain.  INSTRUCTIONS:  Please follow up with [employer] regarding the eligibility for a new CPAP machine. Continue to monitor your rib area for any changes or persistent pain. If you experience any new or worsening symptoms, please contact our office.

## 2024-08-07 NOTE — Progress Notes (Signed)
 Subjective:    Patient ID: Crystal White, female    DOB: 1941-09-14, 83 y.o.   MRN: [id number]  Patient Care Team: Bertrum Charlie LITTIE, MD as PCP - General (Family Medicine) Ashley Soulier, DPM as Referring Physician (Podiatry) Ammon Blunt, MD as Consulting Physician (Cardiology) Tamea Dedra LITTIE, MD as Consulting Physician (Pulmonary Disease)  Chief Complaint  Patient presents with   COPD    Shortness of breath. Cough and wheezing.     BACKGROUND/INTERVAL:This is an 83 year old current smoker (half PPD) who follows here for the issue of COPD, obstructive sleep apnea with nocturnal hypoxemia and dyspnea.  She also follows for lung nodules.  This is a scheduled appointment.  I last saw this patient on 04 Apr 2024.    HPI Discussed the use of AI scribe software for clinical note transcription with the patient, who gave verbal consent to proceed.  History of Present Illness   Crystal White is an 83 year old female with severe COPD, chronic respiratory failure hypoxia, and obstructive sleep apnea who presents for follow-up.  She has experienced a couple of falls recently, [place] resulting in bruising across her chest. Initially, there was no pain, but four days later, she began experiencing pain and difficulty breathing. She sought medical attention and underwent x-rays. Pain medication was prescribed, and the pain has since improved significantly, although she still experiences some discomfort when coughing deeply.  She uses a CPAP machine for her obstructive sleep apnea and reports doing 'just fine' with it. She is unsure of the machine's age but believes she has had it for a few years. She recalls a previous recall incident requiring a switch to a Luna device.  Regarding her COPD management, she is currently taking Breztri . No significant sputum production, fevers, or chills.  Feels that the Breztri  is managing symptoms well.  She has multiple  lung nodules noted that have been stable.  She has aged out from lung cancer screening but we continue surveillance of these nodules.  She unfortunately continues to smoke half a pack of cigarettes per day.  She was counseled extensively with regards to discontinuation.      Review of Systems A 10 point review of systems was performed and it is as noted above otherwise negative.   Patient Active Problem List   Diagnosis Date Noted   Wears partial dentures 07/17/2019   Incisional hernia, without obstruction or gangrene 06/03/2016   Osteoporosis 02/22/2016   Musculoskeletal chest pain 07/14/2015   Chronic respiratory failure (HCC) 07/14/2015   Acute bronchitis 07/14/2015   Other emphysema (HCC) 07/14/2015   Chronic renal insufficiency 07/14/2015   Chest pain 07/13/2015   Multiple pulmonary nodules 06/02/2015   Colon polyp 05/29/2015   Hemoptysis 04/29/2015   Sprain of ankle 04/14/2015   Anxiety 04/14/2015   Barrett's esophagus 04/14/2015   Acute exacerbation of chronic obstructive airways disease (HCC) 04/14/2015   Bursitis 04/14/2015   Cellulitis 04/14/2015   Claudication 04/14/2015   Cough 04/14/2015   Deficiency, 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   Clinical depression 04/14/2015   Dizziness 04/14/2015   Dyslipidemia 04/14/2015   Essential (primary) hypertension 04/14/2015   Flank pain 04/14/2015   H/O suicide attempt 04/14/2015   Dysphonia 04/14/2015   Hypercholesteremia 04/14/2015   Cannot sleep 04/14/2015   Malaise and fatigue 04/14/2015   Mild cognitive impairment 04/14/2015   Cramp in muscle 04/14/2015   Muscle ache 04/14/2015   Neuropathy 04/14/2015   Adiposity 04/14/2015  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   Candida infection of mouth 04/14/2015   Abnormal loss of weight 04/14/2015   Decreased body weight 04/14/2015   Artificial cardiac pacemaker 10/08/2014   Apnea, sleep 10/08/2014   Presence of cardiac pacemaker 10/08/2014   Hyperlipidemia 10/08/2014    COPD (chronic obstructive pulmonary disease) (HCC) 02/13/2012   Tobacco abuse 02/13/2012   Sinus congestion 02/13/2012   Current tobacco use 02/13/2012   Nasal congestion 02/13/2012   Tobacco use disorder 02/13/2012   Esophagitis, reflux 08/28/2006   Abdominal pain, right lower quadrant 08/22/2005   Acute appendicitis with peritoneal abscess 08/22/2005    Social History   Tobacco Use   Smoking status: Every Day    Current packs/day: 0.50    Average packs/day: 1 pack/day for 67.7 years (67.4 ttl pk-yrs)    Types: Cigarettes    Start date: 1958   Smokeless tobacco: Never  Substance Use Topics   Alcohol use: Yes    Alcohol/week: 0.0 standard drinks of alcohol    Comment: 1/2 glass wine -rare    Allergies  Allergen Reactions   Iodinated Contrast Media Other (See Comments)    Burning sensation during contrast media injections. Patient states the IV contrast makes her very hot. She has had multiple CT Scans with IV contrast and states she only gets the warm sensation. This is not an allergic reaction, but is noted in her [facility] chart as well. Elouise Lesches, ARRT R CT, had this discussion with her on 01/10/19 at 1130am.   Bacitracin  Swelling and Rash   Statins Other (See Comments) and Rash    Reaction:  Leg cramps  Reaction:  Leg cramps     Current Meds  Medication Sig   acetaminophen  (TYLENOL ) 500 MG tablet Take 1,000 mg by mouth every 6 (six) hours as needed (pain/headaches.).    albuterol  (PROVENTIL  HFA) 108 (90 Base) MCG/ACT inhaler Inhale 2 puffs into the lungs every 6 (six) hours as needed for wheezing or shortness of breath.   albuterol  (PROVENTIL ) (2.5 MG/3ML) 0.083% nebulizer solution Take 3 mLs (2.5 mg total) by nebulization every 4 (four) hours as needed for wheezing or shortness of breath.   Budeson-Glycopyrrol-Formoterol  (BREZTRI  AEROSPHERE) 160-9-4.8 MCG/ACT AERO Inhale 2 puffs into the lungs in the morning and at bedtime.   hydrochlorothiazide  (HYDRODIURIL ) 25  MG tablet Take 1 tablet (25 mg total) by mouth daily.   LORazepam (ATIVAN) 0.5 MG tablet Take 0.5 mg by mouth as needed for anxiety.   losartan  (COZAAR ) 100 MG tablet TAKE ONE TABLET BY MOUTH EVERYDAY AT BEDTIME   melatonin 5 MG TABS Take 2.5 mg by mouth at bedtime.   mirtazapine  (REMERON ) 30 MG tablet Take 1 tablet (30 mg total) by mouth at bedtime.   OXYGEN  Inhale into the lungs daily. Uses at night with CPAP and PRN during daytime   pantoprazole  (PROTONIX ) 40 MG tablet Take 1 tablet (40 mg total) by mouth 2 (two) times daily.   TYRVAYA  0.03 MG/ACT SOLN INSTILL 1 SPRAY IN EACH NOSTRIL TWICE DAILY (APPROXIMATELY 12 HORS APART). DO NOT INHALE THROUGH NOSE. YOU MAY PINCH THE NOSTRILS CLOSED FOR A FEW MOMENTS AFTERWARDS TO PREVENT UNINENTIONAL INHALATION.    Immunization History  Administered Date(s) Administered   Fluad Quad(high Dose 65+) 08/11/2020, 10/12/2021, 10/25/2023   Fluad Trivalent(High Dose 65+) 09/12/2023   INFLUENZA, HIGH DOSE SEASONAL PF 08/28/2014, 08/10/2015, 07/25/2016, 10/03/2017, 08/30/2018, 09/18/2019, 09/01/2022, 08/07/2024   Influenza Split 09/20/2006, 09/02/2010, 09/10/2012   Influenza Whole 08/28/2012   Influenza, Seasonal, Injecte, Preservative  Fre 12/17/2003   Influenza-Unspecified 08/28/2013   Moderna Covid-19 Fall Seasonal Vaccine 77yrs & older 09/12/2023   Moderna Covid-19 Vaccine Bivalent Booster 29yrs & up 09/01/2022   Moderna SARS-COV2 Booster Vaccination 10/12/2020, 10/25/2023   Moderna Sars-Covid-2 Vaccination 01/09/2020, 02/06/2020, 05/19/2021   Pneumococcal Conjugate-13 08/28/2014   Pneumococcal Polysaccharide-23 03/07/2011   Respiratory Syncytial Virus Vaccine,Recomb Aduvanted(Arexvy) 10/11/2022   Tdap 08/28/2014   Zoster Recombinant(Shingrix) 05/19/2021, 10/11/2022   Zoster, Live 11/04/2012, 12/13/2012        Objective:   BP (!) 140/84   Pulse 63   Temp 97.6 F (36.4 C) (Temporal)   Ht 5' 3 (1.6 m)   Wt 108 lb 3.2 oz (49.1 kg)   SpO2 96%  Comment: 2L oxygen   BMI 19.17 kg/m   SpO2: 96 % (2L oxygen )  GENERAL: Well-developed patient elderly woman in no acute distress.  Fully ambulatory, mild to moderate use of accessories of respiration HEAD: Normocephalic, atraumatic.  EYES: Pupils equal, round, reactive to light.  No scleral icterus.  MOUTH: Nose/mouth/throat not examined due to institutional masking requirements.   NECK: Supple. No thyromegaly. Trachea midline. No JVD.  No adenopathy. PULMONARY: Increased AP diameter.  Distant breath sounds, scattered rhonchi throughout, no wheezes.   CARDIOVASCULAR: S1 and S2. Regular rate and rhythm.  No rubs, murmurs or gallops heard.   ABDOMEN: Benign. MUSCULOSKELETAL: Kyphosis, no joint deformity, no clubbing, no edema.  NEUROLOGIC: No overt focal deficit.  Speech is fluent. SKIN: Intact,warm,dry.  Limited exam: No rashes. PSYCH: Mood and behavior normal.        Assessment & Plan:     ICD-10-CM   1. Stage 3 severe COPD by GOLD classification (HCC)  J44.9     2. Chronic respiratory failure with hypoxia (HCC)  J96.11     3. Lung nodule seen on imaging study  R91.1     4. Tobacco dependence due to cigarettes  F17.210     5. Need for immunization against influenza  Z23       Orders Placed This Encounter  Procedures   Flu vaccine HIGH DOSE PF(Fluzone Trivalent)   Discussion:    Severe chronic obstructive pulmonary disease with chronic respiratory failure and hypoxia COPD with some ongoing symptoms, including mild cough and discomfort.  Compliant with Breztri . - Continue Breztri  as prescribed. - Administer flu vaccine today.  Obstructive sleep apnea Obstructive sleep apnea is managed with CPAP. The current CPAP machine may not be transmitting compliance data effectively due to its age. - Contact [employer] to check eligibility for a new CPAP machine. - If eligible, send in order for a new CPAP machine.  Rib contusion, improved Rib contusion from a fall is improving  with subsided pain and bruising. A small callus on the rib is noted, likely from healing. - Monitor area for any changes or persistent pain.      Advised if symptoms do not improve or worsen, to please contact office for sooner follow up or seek emergency care.    I spent 30 minutes of dedicated to the care of this patient on the date of this encounter to include pre-visit review of records, face-to-face time with the patient discussing conditions above, post visit ordering of testing, clinical documentation with the electronic health record, making appropriate referrals as documented, and communicating necessary findings to members of the patients care team.     C. Leita Sanders, MD Advanced Bronchoscopy PCCM [facility]    *This note was generated using voice recognition software/Dragon and/or AI  transcription program.  Despite best efforts to proofread, errors can occur which can change the meaning. Any transcriptional errors that result from this process are unintentional and may not be fully corrected at the time of dictation.

## 2024-08-22 ENCOUNTER — Ambulatory Visit: Payer: Medicare (Managed Care) | Admitting: Pulmonary Disease

## 2024-09-02 ENCOUNTER — Other Ambulatory Visit: Payer: Self-pay | Admitting: Pulmonary Disease

## 2024-09-02 MED ORDER — BREZTRI AEROSPHERE 160-9-4.8 MCG/ACT IN AERO: 2.0000 | INHALATION_SPRAY | Freq: Two times a day (BID) | RESPIRATORY_TRACT | 3 refills | Status: AC

## 2024-09-02 NOTE — Telephone Encounter (Signed)
 Copied from CRM [id number]. Topic: Clinical - Medication Refill >> Sep 02, 2024  2:56 PM Rilla B wrote: Medication: Budeson-Glycopyrrol-Formoterol  (BREZTRI  AEROSPHERE) 160-9-4.8 MCG/ACT AERO  Has the patient contacted their pharmacy? Yes (Agent: If no, request that the patient contact the pharmacy for the refill. If patient does not wish to contact the pharmacy document the reason why and proceed with request.) (Agent: If yes, when and what did the pharmacy advise?)  This is the patient's preferred pharmacy:  [facility]   Is this the correct pharmacy for this prescription? Yes If no, delete pharmacy and type the correct one.   Has the prescription been filled recently? No  Is the patient out of the medication? No  Has the patient been seen for an appointment in the last year OR does the patient have an upcoming appointment? Yes  Can we respond through MyChart? Yes  Agent: Please be advised that Rx refills may take up to 3 business days. We ask that you follow-up with your pharmacy.

## 2024-09-02 NOTE — Telephone Encounter (Signed)
 Contacted patient states the manufacturer ([employer]) sends her meds to her directly every three months.  Patient reports that she needs a new prescription sent in to be re certified to receive her medication for free.

## 2024-09-12 ENCOUNTER — Ambulatory Visit
Admission: RE | Admit: 2024-09-12 | Discharge: 2024-09-12 | Disposition: A | Discharge status: Home / Self Care | Payer: Medicare (Managed Care) | Source: Ambulatory Visit | Attending: Pulmonary Disease | Admitting: Pulmonary Disease

## 2024-09-12 DIAGNOSIS — R911 Solitary pulmonary nodule: Secondary | ICD-10-CM | POA: Diagnosis present

## 2024-09-20 ENCOUNTER — Ambulatory Visit: Payer: Self-pay | Admitting: Pulmonary Disease

## 2024-11-12 ENCOUNTER — Other Ambulatory Visit: Payer: Self-pay | Admitting: Family Medicine

## 2024-11-12 DIAGNOSIS — Z780 Asymptomatic menopausal state: Secondary | ICD-10-CM
